# Patient Record
Sex: Male | Born: 1935
Health system: Southern US, Community
[De-identification: ages and names within clinical notes are randomized; demographics above are authoritative.]

## PROBLEM LIST (undated history)

## (undated) DIAGNOSIS — D899 Disorder involving the immune mechanism, unspecified: Secondary | ICD-10-CM

## (undated) DIAGNOSIS — M069 Rheumatoid arthritis, unspecified: Secondary | ICD-10-CM

## (undated) DIAGNOSIS — I219 Acute myocardial infarction, unspecified: Secondary | ICD-10-CM

## (undated) DIAGNOSIS — I499 Cardiac arrhythmia, unspecified: Secondary | ICD-10-CM

## (undated) DIAGNOSIS — J189 Pneumonia, unspecified organism: Secondary | ICD-10-CM

## (undated) DIAGNOSIS — I4891 Unspecified atrial fibrillation: Secondary | ICD-10-CM

## (undated) DIAGNOSIS — I2510 Atherosclerotic heart disease of native coronary artery without angina pectoris: Secondary | ICD-10-CM

## (undated) DIAGNOSIS — J449 Chronic obstructive pulmonary disease, unspecified: Secondary | ICD-10-CM

## (undated) DIAGNOSIS — B958 Unspecified staphylococcus as the cause of diseases classified elsewhere: Secondary | ICD-10-CM

## (undated) DIAGNOSIS — J8410 Pulmonary fibrosis, unspecified: Secondary | ICD-10-CM

## (undated) DIAGNOSIS — R0602 Shortness of breath: Secondary | ICD-10-CM

## (undated) DIAGNOSIS — L089 Local infection of the skin and subcutaneous tissue, unspecified: Secondary | ICD-10-CM

## (undated) DIAGNOSIS — I714 Abdominal aortic aneurysm, without rupture, unspecified: Secondary | ICD-10-CM

## (undated) DIAGNOSIS — I10 Essential (primary) hypertension: Secondary | ICD-10-CM

## (undated) DIAGNOSIS — R310 Gross hematuria: Secondary | ICD-10-CM

## (undated) DIAGNOSIS — M1990 Unspecified osteoarthritis, unspecified site: Secondary | ICD-10-CM

## (undated) DIAGNOSIS — D649 Anemia, unspecified: Secondary | ICD-10-CM

## (undated) HISTORY — DX: Unspecified atrial fibrillation: I48.91

## (undated) HISTORY — DX: Atherosclerotic heart disease of native coronary artery without angina pectoris: I25.10

## (undated) HISTORY — DX: Abdominal aortic aneurysm, without rupture: I71.4

## (undated) HISTORY — PX: CORONARY ANGIOPLASTY: SHX604

## (undated) HISTORY — PX: HAND SURGERY: SHX662

## (undated) HISTORY — DX: Unspecified osteoarthritis, unspecified site: M19.90

## (undated) HISTORY — DX: Abdominal aortic aneurysm, without rupture, unspecified: I71.40

## (undated) HISTORY — DX: Rheumatoid arthritis, unspecified: M06.9

## (undated) HISTORY — DX: Acute myocardial infarction, unspecified: I21.9

## (undated) HISTORY — DX: Chronic obstructive pulmonary disease, unspecified: J44.9

## (undated) HISTORY — DX: Unspecified staphylococcus as the cause of diseases classified elsewhere: B95.8

## (undated) HISTORY — DX: Disorder involving the immune mechanism, unspecified: D89.9

## (undated) HISTORY — PX: BACK SURGERY: SHX140

## (undated) HISTORY — DX: Pulmonary fibrosis, unspecified: J84.10

## (undated) HISTORY — PX: ESOPHAGOGASTRODUODENOSCOPY: SHX1529

## (undated) HISTORY — DX: Local infection of the skin and subcutaneous tissue, unspecified: L08.9

---

## 1998-03-27 DIAGNOSIS — I219 Acute myocardial infarction, unspecified: Secondary | ICD-10-CM

## 1998-03-27 HISTORY — PX: OTHER SURGICAL HISTORY: SHX169

## 1998-03-27 HISTORY — DX: Acute myocardial infarction, unspecified: I21.9

## 1999-05-30 ENCOUNTER — Encounter: Payer: Self-pay | Admitting: Rheumatology

## 1999-05-30 ENCOUNTER — Encounter: Admission: RE | Admit: 1999-05-30 | Discharge: 1999-05-30 | Payer: Self-pay | Admitting: Rheumatology

## 1999-06-08 ENCOUNTER — Inpatient Hospital Stay (HOSPITAL_COMMUNITY): Admission: EM | Admit: 1999-06-08 | Discharge: 1999-06-11 | Payer: Self-pay | Admitting: Emergency Medicine

## 1999-06-08 ENCOUNTER — Encounter: Payer: Self-pay | Admitting: Rheumatology

## 2000-12-14 ENCOUNTER — Encounter (INDEPENDENT_AMBULATORY_CARE_PROVIDER_SITE_OTHER): Payer: Self-pay | Admitting: Specialist

## 2000-12-14 ENCOUNTER — Inpatient Hospital Stay (HOSPITAL_COMMUNITY): Admission: EM | Admit: 2000-12-14 | Discharge: 2000-12-19 | Payer: Self-pay

## 2000-12-14 ENCOUNTER — Encounter: Payer: Self-pay | Admitting: *Deleted

## 2000-12-15 ENCOUNTER — Encounter: Payer: Self-pay | Admitting: *Deleted

## 2000-12-17 ENCOUNTER — Encounter: Payer: Self-pay | Admitting: Infectious Diseases

## 2000-12-18 ENCOUNTER — Encounter: Payer: Self-pay | Admitting: *Deleted

## 2001-01-23 ENCOUNTER — Ambulatory Visit (HOSPITAL_COMMUNITY): Admission: RE | Admit: 2001-01-23 | Discharge: 2001-01-23 | Payer: Self-pay | Admitting: Neurological Surgery

## 2001-01-29 ENCOUNTER — Encounter: Admission: RE | Admit: 2001-01-29 | Discharge: 2001-01-29 | Payer: Self-pay | Admitting: Urology

## 2001-01-29 ENCOUNTER — Encounter: Payer: Self-pay | Admitting: Urology

## 2001-06-11 ENCOUNTER — Encounter: Payer: Self-pay | Admitting: Urology

## 2001-06-11 ENCOUNTER — Encounter: Admission: RE | Admit: 2001-06-11 | Discharge: 2001-06-11 | Payer: Self-pay | Admitting: Urology

## 2001-06-12 ENCOUNTER — Encounter (INDEPENDENT_AMBULATORY_CARE_PROVIDER_SITE_OTHER): Payer: Self-pay | Admitting: Specialist

## 2001-06-12 ENCOUNTER — Ambulatory Visit (HOSPITAL_BASED_OUTPATIENT_CLINIC_OR_DEPARTMENT_OTHER): Admission: RE | Admit: 2001-06-12 | Discharge: 2001-06-12 | Payer: Self-pay | Admitting: Urology

## 2001-08-22 ENCOUNTER — Encounter: Admission: RE | Admit: 2001-08-22 | Discharge: 2001-08-22 | Payer: Self-pay | Admitting: Urology

## 2001-08-22 ENCOUNTER — Encounter: Payer: Self-pay | Admitting: Urology

## 2001-10-30 ENCOUNTER — Ambulatory Visit (HOSPITAL_COMMUNITY): Admission: RE | Admit: 2001-10-30 | Discharge: 2001-10-30 | Payer: Self-pay | Admitting: Gastroenterology

## 2001-10-30 ENCOUNTER — Encounter: Payer: Self-pay | Admitting: Gastroenterology

## 2001-10-31 ENCOUNTER — Encounter: Payer: Self-pay | Admitting: Rheumatology

## 2001-10-31 ENCOUNTER — Encounter: Admission: RE | Admit: 2001-10-31 | Discharge: 2001-10-31 | Payer: Self-pay | Admitting: Rheumatology

## 2001-12-03 ENCOUNTER — Encounter: Payer: Self-pay | Admitting: General Surgery

## 2001-12-10 ENCOUNTER — Encounter: Payer: Self-pay | Admitting: Anesthesiology

## 2001-12-10 ENCOUNTER — Encounter (INDEPENDENT_AMBULATORY_CARE_PROVIDER_SITE_OTHER): Payer: Self-pay

## 2001-12-10 ENCOUNTER — Encounter: Payer: Self-pay | Admitting: Urology

## 2001-12-10 ENCOUNTER — Encounter (INDEPENDENT_AMBULATORY_CARE_PROVIDER_SITE_OTHER): Payer: Self-pay | Admitting: *Deleted

## 2001-12-10 ENCOUNTER — Inpatient Hospital Stay (HOSPITAL_COMMUNITY): Admission: RE | Admit: 2001-12-10 | Discharge: 2001-12-13 | Payer: Self-pay | Admitting: General Surgery

## 2001-12-11 ENCOUNTER — Encounter: Payer: Self-pay | Admitting: Urology

## 2002-04-08 ENCOUNTER — Encounter: Payer: Self-pay | Admitting: Orthopedic Surgery

## 2002-04-14 ENCOUNTER — Encounter: Payer: Self-pay | Admitting: Orthopedic Surgery

## 2002-04-14 ENCOUNTER — Inpatient Hospital Stay (HOSPITAL_COMMUNITY): Admission: RE | Admit: 2002-04-14 | Discharge: 2002-04-17 | Payer: Self-pay | Admitting: Orthopedic Surgery

## 2002-11-07 ENCOUNTER — Encounter: Admission: RE | Admit: 2002-11-07 | Discharge: 2002-11-07 | Payer: Self-pay | Admitting: Rheumatology

## 2002-11-07 ENCOUNTER — Encounter: Payer: Self-pay | Admitting: Rheumatology

## 2003-10-12 ENCOUNTER — Inpatient Hospital Stay (HOSPITAL_COMMUNITY): Admission: EM | Admit: 2003-10-12 | Discharge: 2003-10-15 | Payer: Self-pay | Admitting: Emergency Medicine

## 2004-03-26 ENCOUNTER — Emergency Department (HOSPITAL_COMMUNITY): Admission: EM | Admit: 2004-03-26 | Discharge: 2004-03-26 | Payer: Self-pay | Admitting: *Deleted

## 2004-05-20 ENCOUNTER — Ambulatory Visit (HOSPITAL_COMMUNITY): Admission: RE | Admit: 2004-05-20 | Discharge: 2004-05-20 | Payer: Self-pay | Admitting: Gastroenterology

## 2004-06-06 ENCOUNTER — Inpatient Hospital Stay (HOSPITAL_COMMUNITY): Admission: RE | Admit: 2004-06-06 | Discharge: 2004-06-08 | Payer: Self-pay | Admitting: Orthopedic Surgery

## 2006-06-06 ENCOUNTER — Ambulatory Visit: Payer: Self-pay | Admitting: Thoracic Surgery

## 2006-12-11 ENCOUNTER — Ambulatory Visit: Payer: Self-pay | Admitting: Vascular Surgery

## 2007-12-10 ENCOUNTER — Ambulatory Visit: Payer: Self-pay | Admitting: Vascular Surgery

## 2009-05-18 ENCOUNTER — Encounter: Admission: RE | Admit: 2009-05-18 | Discharge: 2009-05-18 | Payer: Self-pay | Admitting: Internal Medicine

## 2009-06-02 ENCOUNTER — Ambulatory Visit (HOSPITAL_COMMUNITY): Admission: RE | Admit: 2009-06-02 | Discharge: 2009-06-02 | Payer: Self-pay | Admitting: Geriatric Medicine

## 2009-08-12 ENCOUNTER — Ambulatory Visit (HOSPITAL_COMMUNITY): Admission: RE | Admit: 2009-08-12 | Discharge: 2009-08-12 | Payer: Self-pay | Admitting: Gastroenterology

## 2010-02-22 ENCOUNTER — Ambulatory Visit: Payer: Self-pay | Admitting: Vascular Surgery

## 2010-07-02 ENCOUNTER — Emergency Department (HOSPITAL_COMMUNITY): Payer: Medicare Other

## 2010-07-02 ENCOUNTER — Inpatient Hospital Stay (HOSPITAL_COMMUNITY)
Admission: EM | Admit: 2010-07-02 | Discharge: 2010-07-08 | DRG: 581 | Disposition: A | Discharge status: Home Health | Payer: Medicare Other | Attending: Internal Medicine | Admitting: Internal Medicine

## 2010-07-02 DIAGNOSIS — Z9861 Coronary angioplasty status: Secondary | ICD-10-CM

## 2010-07-02 DIAGNOSIS — E669 Obesity, unspecified: Secondary | ICD-10-CM | POA: Diagnosis present

## 2010-07-02 DIAGNOSIS — M069 Rheumatoid arthritis, unspecified: Secondary | ICD-10-CM | POA: Diagnosis present

## 2010-07-02 DIAGNOSIS — K219 Gastro-esophageal reflux disease without esophagitis: Secondary | ICD-10-CM | POA: Diagnosis present

## 2010-07-02 DIAGNOSIS — E785 Hyperlipidemia, unspecified: Secondary | ICD-10-CM | POA: Diagnosis present

## 2010-07-02 DIAGNOSIS — I2510 Atherosclerotic heart disease of native coronary artery without angina pectoris: Secondary | ICD-10-CM | POA: Diagnosis present

## 2010-07-02 DIAGNOSIS — N400 Enlarged prostate without lower urinary tract symptoms: Secondary | ICD-10-CM | POA: Diagnosis present

## 2010-07-02 DIAGNOSIS — I252 Old myocardial infarction: Secondary | ICD-10-CM

## 2010-07-02 DIAGNOSIS — Z7982 Long term (current) use of aspirin: Secondary | ICD-10-CM

## 2010-07-02 DIAGNOSIS — J449 Chronic obstructive pulmonary disease, unspecified: Secondary | ICD-10-CM | POA: Diagnosis present

## 2010-07-02 DIAGNOSIS — I10 Essential (primary) hypertension: Secondary | ICD-10-CM | POA: Diagnosis present

## 2010-07-02 DIAGNOSIS — Z96649 Presence of unspecified artificial hip joint: Secondary | ICD-10-CM

## 2010-07-02 DIAGNOSIS — J4489 Other specified chronic obstructive pulmonary disease: Secondary | ICD-10-CM | POA: Diagnosis present

## 2010-07-02 DIAGNOSIS — M65839 Other synovitis and tenosynovitis, unspecified forearm: Secondary | ICD-10-CM | POA: Diagnosis present

## 2010-07-02 DIAGNOSIS — IMO0002 Reserved for concepts with insufficient information to code with codable children: Secondary | ICD-10-CM

## 2010-07-02 DIAGNOSIS — M1990 Unspecified osteoarthritis, unspecified site: Secondary | ICD-10-CM | POA: Diagnosis present

## 2010-07-02 DIAGNOSIS — Z87891 Personal history of nicotine dependence: Secondary | ICD-10-CM

## 2010-07-02 LAB — DIFFERENTIAL
Basophils Absolute: 0 10*3/uL (ref 0.0–0.1)
Basophils Relative: 0 % (ref 0–1)
Eosinophils Absolute: 0 10*3/uL (ref 0.0–0.7)
Eosinophils Relative: 0 % (ref 0–5)
Lymphs Abs: 0.9 10*3/uL (ref 0.7–4.0)
Monocytes Absolute: 1.5 10*3/uL — ABNORMAL HIGH (ref 0.1–1.0)

## 2010-07-02 LAB — BASIC METABOLIC PANEL
CO2: 22 mEq/L (ref 19–32)
GFR calc Af Amer: >60 mL/min (ref >60)
Glucose, Bld: 101 mg/dL — ABNORMAL HIGH (ref 70–99)
Potassium: 3.7 mEq/L (ref 3.5–5.1)
Sodium: 135 mEq/L (ref 135–145)

## 2010-07-02 LAB — CBC
MCHC: 34.5 g/dL (ref 30.0–36.0)
Platelets: 205 10*3/uL (ref 150–400)
RDW: 13.3 % (ref 11.5–15.5)

## 2010-07-03 ENCOUNTER — Emergency Department (HOSPITAL_COMMUNITY): Payer: Medicare Other

## 2010-07-03 LAB — COMPREHENSIVE METABOLIC PANEL
AST: 22 U/L (ref 0–37)
Albumin: 2.9 g/dL — ABNORMAL LOW (ref 3.5–5.2)
Alkaline Phosphatase: 63 U/L (ref 39–117)
CO2: 22 mEq/L (ref 19–32)
Total Bilirubin: 1.5 mg/dL — ABNORMAL HIGH (ref 0.3–1.2)
Total Protein: 5.7 g/dL — ABNORMAL LOW (ref 6.0–8.3)

## 2010-07-03 LAB — CBC
HCT: 35.7 % — ABNORMAL LOW (ref 39.0–52.0)
HCT: 36 % — ABNORMAL LOW (ref 39.0–52.0)
Hemoglobin: 12.5 g/dL — ABNORMAL LOW (ref 13.0–17.0)
MCH: 31.4 pg (ref 26.0–34.0)
MCHC: 34.7 g/dL (ref 30.0–36.0)
MCHC: 34.1 g/dL (ref 30.0–36.0)
MCV: 92.7 fL (ref 78.0–100.0)
MCV: 92.3 fL (ref 78.0–100.0)
Platelets: 163 10*3/uL (ref 150–400)
Platelets: 173 10*3/uL (ref 150–400)
RBC: 3.85 MIL/uL — ABNORMAL LOW (ref 4.22–5.81)
RDW: 13.5 % (ref 11.5–15.5)
RDW: 13.5 % (ref 11.5–15.5)
WBC: 21.5 10*3/uL — ABNORMAL HIGH (ref 4.0–10.5)
WBC: 21.9 10*3/uL — ABNORMAL HIGH (ref 4.0–10.5)

## 2010-07-03 LAB — CARDIAC PANEL(CRET KIN+CKTOT+MB+TROPI)
Relative Index: 1.7 (ref 0.0–2.5)
Total CK: 212 U/L (ref 7–232)
Troponin I: 0.03 ng/mL (ref 0.00–0.06)

## 2010-07-03 LAB — TROPONIN I: Troponin I: 0.04 ng/mL (ref 0.00–0.06)

## 2010-07-03 LAB — CK TOTAL AND CKMB (NOT AT ARMC): Relative Index: 0.7 (ref 0.0–2.5)

## 2010-07-04 LAB — CBC
MCH: 30.7 pg (ref 26.0–34.0)
MCHC: 33.3 g/dL (ref 30.0–36.0)
Platelets: 189 10*3/uL (ref 150–400)
RDW: 13.6 % (ref 11.5–15.5)

## 2010-07-04 LAB — BASIC METABOLIC PANEL
CO2: 23 mEq/L (ref 19–32)
Calcium: 8.5 mg/dL (ref 8.4–10.5)
Creatinine, Ser: 0.81 mg/dL (ref 0.4–1.5)
GFR calc Af Amer: >60 mL/min (ref >60)
GFR calc non Af Amer: >60 mL/min (ref >60)

## 2010-07-04 LAB — CK TOTAL AND CKMB (NOT AT ARMC)
CK, MB: 3.5 ng/mL (ref 0.3–4.0)
Relative Index: 2.2 (ref 0.0–2.5)
Total CK: 157 U/L (ref 7–232)

## 2010-07-04 LAB — TROPONIN I: Troponin I: 0.01 ng/mL (ref 0.00–0.06)

## 2010-07-05 LAB — BASIC METABOLIC PANEL
GFR calc non Af Amer: >60 mL/min (ref >60)
Glucose, Bld: 98 mg/dL (ref 70–99)
Potassium: 3.5 mEq/L (ref 3.5–5.1)
Sodium: 138 mEq/L (ref 135–145)

## 2010-07-05 LAB — SURGICAL PCR SCREEN: MRSA, PCR: NEGATIVE

## 2010-07-05 LAB — CBC
HCT: 33.5 % — ABNORMAL LOW (ref 39.0–52.0)
Hemoglobin: 11.1 g/dL — ABNORMAL LOW (ref 13.0–17.0)
RDW: 13.5 % (ref 11.5–15.5)
WBC: 12.6 10*3/uL — ABNORMAL HIGH (ref 4.0–10.5)

## 2010-07-06 LAB — BASIC METABOLIC PANEL
CO2: 22 mEq/L (ref 19–32)
Calcium: 8.4 mg/dL (ref 8.4–10.5)
Chloride: 110 mEq/L (ref 96–112)
Glucose, Bld: 87 mg/dL (ref 70–99)
Potassium: 4 mEq/L (ref 3.5–5.1)
Sodium: 141 mEq/L (ref 135–145)

## 2010-07-06 LAB — CBC
HCT: 30.4 % — ABNORMAL LOW (ref 39.0–52.0)
Hemoglobin: 10.2 g/dL — ABNORMAL LOW (ref 13.0–17.0)
MCHC: 33.6 g/dL (ref 30.0–36.0)
RBC: 3.29 MIL/uL — ABNORMAL LOW (ref 4.22–5.81)

## 2010-07-07 DIAGNOSIS — B950 Streptococcus, group A, as the cause of diseases classified elsewhere: Secondary | ICD-10-CM

## 2010-07-07 DIAGNOSIS — L089 Local infection of the skin and subcutaneous tissue, unspecified: Secondary | ICD-10-CM

## 2010-07-07 LAB — CBC
HCT: 30.9 % — ABNORMAL LOW (ref 39.0–52.0)
Hemoglobin: 10.3 g/dL — ABNORMAL LOW (ref 13.0–17.0)
MCH: 30.7 pg (ref 26.0–34.0)
MCHC: 33.3 g/dL (ref 30.0–36.0)
MCV: 92.2 fL (ref 78.0–100.0)

## 2010-07-08 ENCOUNTER — Inpatient Hospital Stay (HOSPITAL_COMMUNITY): Payer: Medicare Other

## 2010-07-08 LAB — CBC
HCT: 32 % — ABNORMAL LOW (ref 39.0–52.0)
MCH: 31.1 pg (ref 26.0–34.0)
MCHC: 33.8 g/dL (ref 30.0–36.0)
MCV: 92.2 fL (ref 78.0–100.0)
Platelets: 250 10*3/uL (ref 150–400)
RDW: 13.3 % (ref 11.5–15.5)

## 2010-07-08 LAB — ANAEROBIC CULTURE

## 2010-07-08 LAB — BASIC METABOLIC PANEL
BUN: 7 mg/dL (ref 6–23)
Creatinine, Ser: 0.61 mg/dL (ref 0.4–1.5)
GFR calc non Af Amer: >60 mL/min (ref >60)
Glucose, Bld: 95 mg/dL (ref 70–99)

## 2010-07-09 LAB — CULTURE, BLOOD (ROUTINE X 2): Culture: NO GROWTH

## 2010-07-18 NOTE — Discharge Summary (Signed)
[name], [name]               ACCOUNT NO.:  [id number]  MEDICAL RECORD NO.:  [id number]           PATIENT TYPE:  I  LOCATION:  4742                         FACILITY:  [facility]  PHYSICIAN:  Isidor Holts, M.D.  DATE OF BIRTH:  1930-05-10  DATE OF ADMISSION:  07/02/2010 DATE OF DISCHARGE:  07/08/2010                              DISCHARGE SUMMARY   PRIMARY MD:  Hal T. Stoneking, MD  PRIMARY CARDIOLOGIST:  Lyn Records, MD  DISCHARGE DIAGNOSES: 1. Abscess right hand/forearm and flexor tenosynovitis of right long     finger, status post incision and drainage and debridement on July 05, 2010. The culprit organism, group A Streptococcus pyogenes. 2. History of coronary artery disease, status post cardiac stent. 3. History of abdominal aortic aneurysm. 4. GERD, status post Nissen fundoplication. 5. History of diverticulosis. 6. Rheumatoid arthritis and osteoarthritis. 7. Dyslipidemia. 8. Benign prostatic hypertrophy.  DISCHARGE MEDICATIONS: 1. Rocephin 2 g IV q.24 h to be completed on Aug 01, 2010, inclusive. 2. Oxycodone 5 mg IR 5 mg p.o. p.r.n. q. 4 hourly for pain, extra 42     pills have been dispensed. 3. Altace 2.5 mg p.o. daily. 4. Aspirin OTC 81 mg p.o. daily. 5. Calcium carbonate OTC 600 mg p.o. daily. 6. Flomax 0.4 mg p.o. daily. 7. Lipitor 80 mg p.o. nightly. 8. Meloxicam 15 mg p.o. daily. 9. Metoprolol tartrate 50 mg p.o. b.i.d. 10.Multivitamins 1 p.o. daily. 11.Nitroglycerin 0.4 mg one tablet sublingually p.r.n. q. 5 minutes x3     doses for chest pain. 12.Omeprazole 20 mg p.o. daily. 13.Plaquenil 200 mg p.o. b.i.d. 14.Prednisone 5 mg p.o. daily. 15.Tylenol Extra Strength 500 mg 2 tablets p.o. p.r.n. t.i.d. for     arthritis. 16.Voltaren gel 1%  4 g applied to lower extremity joints p.r.n. q. 6     hourly for pain. 17.Zyrtec 10 mg p.o. daily.  PROCEDURES: 1. X-ray right hand July 03, 2010.  This showed marked degenerative     changes in the hands  and wrists as before diffuse soft tissue     swelling of the middle finger without evidence of underlying bone     lysis to suggest osseous infection. 2. Chest x-ray July 03, 2010.  This showed findings of COPD, lungs are     otherwise clear. 3. Chest x-ray July 08, 2010.  This showed a left arm PICC line with     tip of lying cavoatrial junction, no diagnostic pneumothorax.     Worsening inhalation from prior exam with mild interstitial     prominence bilaterally suspicious for mild interstitial edema. 4. Right long finger debridement of skin and subcutaneous tissue,     right long finger joint with  flexor tendon sheaths and right     forearm incision and drainage on July 05, 2010, by Dr. [name], hand surgeon, this was an uncomplicated procedure.  CONSULTATIONS:   1. [name] Done, MD, hand surgeon. 2. [name] Lav, MD, infectious disease specialist.  ADMISSION HISTORY:  As in H and P notes of July 02, 2010, dictated by  Dr. Lonia [name].  However, in brief, this is a 77 year old male, with known history of osteoarthritis, status post right total hip replacement, rheumatoid arthritis, coronary artery disease, status post cardiac stent, abdominal aortic aneurysm, previous history of epidural abscess, previous history of right hand abscess, dyslipidemia, benign prostatic hypertrophy, GERD, status post Nissen fundoplication, diverticulosis, presenting with a 3-day history of progressive right upper extremity swelling, pain and redness.  Symptoms initially involved his right elbow, but has progressed to involve the right hand.  He has had some fever and chills at home. He was admitted for further evaluation, investigation and management.  CLINICAL COURSE: 1. Right upper extremity infection and abscess.  The patient presented     as described above.  X-ray of the right hand showed soft tissue     swelling.  No evidence of bony infection.  The patient was      commenced on broad-spectrum antibiotic therapy, with a combination     of vancomycin and cefepime.  Hand Surgical consultation was called,     and kindly provided by Dr. [name], who diagnosed right long     finger flexor tendon tenosynovitis as well as the     right forearm abscess.  He took the patient to OR on July 05, 2010, performed I and D and debridement. Abscess culture     subsequently grew group A Streptococcus by genus.  Thus antibiotic     therapy was rationalized to monotherapy with Rocephin as of July 07, 2010.  Infectious Disease consultation was kindly provided by     Dr. Paulette Blanch [place], who recommended high-dose Rocephin, i.e. 2 g     IV q.24 h and a total of 4 weeks of antibiotic therapy to be     completed on Aug 01, 2010.  Over the course of hospitalization, the     patient improved,and clinically defervesced.  In the last few days of     hospitalization, there were no further recorded episodes of     pyrexia.  White cell count which was 21.5 on July 03, 2010, had by     July 08, 2010, normalized at 9.1.  The patient felt considerably     better as a matter of fact, local inflammatory phenomena had  significantly ameliorated, and although his wound continued to drain,     this is being managed with daily dressings and he had hydrotherapy     up until July 08, 2010, p.m.  The patient is to follow up with Dr.     Melvyn [name].  2. Coronary artery disease.  There were no problems referable to this.     During the course of the patient's hospitalization, he continued on     preadmission cardiotropic medications.  3. Gastroesophageal reflux disease, the patient was asymptomatic, on     proton pump inhibitor treatment.  4. History of rheumatoid arthritis.  The patient continued on chronic     steroid treatment in preadmission dosage.  5. Dyslipidemia.  The patient is on Zetia for this.  6. BPH.  The patient continues on Flomax.  He had no symptoms of      prostatism during the course of this hospitalization.  DISPOSITION:  The patient was on July 08, 2010, considered clinically stable for discharge, he was therefore discharged accordingly.  PICC line for antibiotic administration was placed on July 07, 2010.  The patient was discharged on July 08, 2010.  ACTIVITY:  As tolerated.  Recommended to increase activity slowly.  DIET:  Heart-healthy.  WOUND CARE:  Daily wound dressings by home health RN.  FOLLOWUP INSTRUCTIONS:  The patient is to follow up with Dr. [name], hand surgeon on Monday July 11, 2010, at 1:00 p.m. appointments already been scheduled, telephone number [phone].  In addition, the patient is to follow up with Dr. Paulette Blanch [facility], Infectious Disease specialist on Tuesday Aug 02, 2010, at 4:00 p.m. and appointment had already been scheduled, telephone number [phone].  SPECIAL INSTRUCTIONS:  Home health RN has been arranged for wound dressings and antibiotic administration.     Isidor Holts, M.D.     CO/MEDQ  D:  07/08/2010  T:  07/09/2010  Job:  [id number]  cc:   Hal T. Stoneking, M.D. Lyn Records, M.D. [name] Lav, MD [name] Done, MD  Electronically Signed by Isidor Holts M.D. on 07/18/2010 03:06:10 PM

## 2010-07-19 NOTE — Op Note (Signed)
[name] [name]               ACCOUNT NO.:  [id number]  MEDICAL RECORD NO.:  [id number]           PATIENT TYPE:  I  LOCATION:  4742                         FACILITY:  [facility]  PHYSICIAN:  [name], MD  DATE OF BIRTH:  11-Aug-1930  DATE OF PROCEDURE:  07/03/2010 DATE OF DISCHARGE:                              OPERATIVE REPORT   PREOPERATIVE DIAGNOSIS:  Right long finger flexor tendon purulent tenosynovitis.  POSTOPERATIVE DIAGNOSIS:  Right long finger flexor tendon purulent tenosynovitis.  ATTENDING PHYSICIAN:  [name], MD who scrubbed and present for the entire procedure.  ASSISTANT SURGEON:  None.  SURGICAL PROCEDURES: 1. Right long finger incision and drainage of flexor tendon sheath. 2. Right long finger debridement skin and subcutaneous tissue and     drainage of abscess.  ANESTHESIA:  General via endotracheal tube.  TOURNIQUET TIME:  8 minutes at 250 mmHg.  SURGICAL DRAIN:  One blue vessel loop. INTRAOPERATIVE FINDINGS:  The patient did have a grossly purulent flexor tendon sheath as well as an abscess involving the entire volar length of the finger grossly infected tissue.  SURGICAL INDICATION:  [name] [name] is a 77 year old gentleman with a worsening right long finger infection.  The patient presented to the emergency department and was admitted to the Medicine Service.  I was consulted and based on the severity of his infection, it was recommended he undergo the above procedure.  Risks, benefits, and alternatives were discussed in detail with the patient and signed informed consent was obtained.  Risks include, but not limited to bleeding, infection, damage to nearby nerves, arteries, or tendons, loss of motion of the elbow, wrist, and digits, persistent infection, loss of the digit, and need for further surgical intervention.  DESCRIPTION OF PROCEDURE:  The patient was properly identified in the preop holding area and a mark with permanent  marker made on the right long finger to indicate the correct operative site.  The patient was then brought back to the operating room, placed supine on the anesthesia table.  General anesthesia was administered.  The patient was started on [name] antibiotics prior to skin incision.  Well-padded tourniquet was then placed on the right brachium and sealed with a 1000 drape.  The right upper extremity was then prepped and draped in normal sterile fashion. Time-out was called, correct side was identified, and the procedure was then begun.  Attention was turned to the right hand where the right upper extremity was prepped and draped in normal fashion.  The long finger was 3 times the size of the other digits.  Sausage-appearing digit.  The limb was then elevated, tourniquet insufflated for exposure. At this time, an oblique incision was made directly over where the abscess area was over the distal interphalangeal joint region.  Gross purulence was encountered, wound cultures were then taken.  After this was then carried out, incision and drainage and debridement of skin and subcutaneous tissue was then carried out along the volar surface of the finger.  The flexor sheath was then opened.  An oblique incision was then made in the palm and the flexor sheath  and drainage was then carried out with a small pediatric feeding tube with 1000 L of fluid running through the flexor sheath.  After drainage of the flexor sheath, thorough irrigation was done with 3 L to drain out the abscess area along the volar surface of the finger.  Copious irrigation was run throughout the finger.  After thorough irrigation, the wound was then loosely reapproximated with a stitch of 4-0 Prolene both proximally and distally closed over a small vessel loop.  Adaptic dressings were then applied.  Sterile compressive bandage was then applied.  The patient was then placed in a well-padded volar splint.  He was then taken back  to the recovery room in good condition.  POSTOPERATIVE PLAN:  The patient admitted back to the Internal Medicine Service, continue on the [name] antibiotic therapy.  I plan to see him back on a daily basis.  He will return to the OR in 2 days for repeat I and D given the severity of infection.     [name], MD     FWO/MEDQ  D:  07/03/2010  T:  07/03/2010  Job:  [id number]  Electronically Signed by [name] Bienenstock IV MD on 07/19/2010 05:49:54 PM

## 2010-07-19 NOTE — Consult Note (Signed)
NAMECORNELIS, KLUVER               ACCOUNT NO.:  [id number]  MEDICAL RECORD NO.:  [id number]           PATIENT TYPE:  I  LOCATION:  4742                         FACILITY:  [facility]  PHYSICIAN:  [name], MD  DATE OF BIRTH:  Apr 18, 1930  DATE OF CONSULTATION:  07/03/2010 DATE OF DISCHARGE:                                CONSULTATION   REASON FOR CONSULTATION:  Right long finger infection.  BRIEF HISTORY:  Mr. Summit is a 77 year old right-hand-dominant gentleman with a 3-day history of worsening infection to his right long finger.  The patient presented to the emergency department with worsening cellulitis and swelling to the right long finger.  They are concerned about the infection and I was asked to see and evaluate the patient for the right long finger.  The patient is being admitted to the hospitalist service secondary to his multiple medical conditions and for IV antibiotics and continued care.  His past medical history, past surgical history, medications, allergies, social history, review of systems as dictated in the [facility], history of physical is well also I reviewed in the chart.  The patient's operative reports in the past also been reviewed to include the procedure from back in 2005 at which time Dr. Mina Marble oriented infection over the dorsal aspect of his hand and wrist.  On examination, he is a somewhat frail-appearing gentleman, height and weight in the medical record.  He is alert and oriented to person, place and time.  On limited examination of the right upper extremity, the patient does have marked swelling into the right long finger tenderness directly over the flexor sheath, sausage-appearing digit.  Any pain with any passive flexion or extension of the digit.  He has no tenderness to his index ring or small swelling from the long finger all the way to the MP joint extending into the palm.  He has redness going all the way up into  his forearm to the epitrochlear region of the elbow.  He is able to flex and extend his elbow, flex and extend his wrist, extend his thumb, extend his digits.  Fingertips are warm.  He has a good radial pulse.  His radiographs of any were reviewed.  The patient does have extensive degenerative changes noted within the wrist from his underlying rheumatoid arthritis.  The patient has complete loss of the radiocarpal articulation and articulation of the distal ulna, relatively good preservation of MP joint and IP joint.  He does have a marked soft tissue swelling to the right long finger.  His white count is elevated at 22.1, glucose 101, creatinine 1.04, potassium 3.7, sodium 135.  IMPRESSION:  Right long finger flexor sheath infection.  PLAN:  Today the findings were reviewed with Mr. Gullo and his wife.  My recommendation is that the patient should undergo formal incision and drainage of the flexor sheath and necessary debridement.  We talked about the risks of surgery to include but not limited to bleeding, infection, damage to nearby nerves, arteries or tendons, loss of motion of wrist and digits, persistent infection and need for further surgical intervention.  All questions  were answered and encouraged today.  The patient voiced understanding of plan and signed inform consent was obtained from the daughter.  No new medications were given.  No medications were changed in his list.  He is on 3 different antibiotics per [facility].  Likely he is going to need repeat I and D within the next 48 hours based on the severity of infection.     [name], MD     FWO/MEDQ  D:  07/03/2010  T:  07/03/2010  Job:  [id number]  Electronically Signed by [name] IV MD on 07/19/2010 05:49:51 PM

## 2010-07-19 NOTE — Op Note (Signed)
NAMELOYE, VENTO               ACCOUNT NO.:  [id number]  MEDICAL RECORD NO.:  [id number]           PATIENT TYPE:  I  LOCATION:  4742                         FACILITY:  [facility]  PHYSICIAN:  [name], MD  DATE OF BIRTH:  12-Oct-1930  DATE OF PROCEDURE:  07/05/2010 DATE OF DISCHARGE:                              OPERATIVE REPORT   PREOPERATIVE DIAGNOSES: 1. Right long finger flexor tenosynovitis. 2. Right forearm abscess.  POSTOPERATIVE DIAGNOSES: 1. Right long finger stenosing tenosynovitis. 2. Right forearm swelling and edema.  ANESTHESIA:  General via LMA.  TOURNIQUET TIME:  Zero minutes.  SURGICAL PROCEDURES: 1. Right long finger debridement of skin and subcutaneous tissue. 2. Right long finger drainage of flexor tendon sheath. 3. Right forearm incision and drainage.  SURGICAL INDICATION:  [name] is a 77 year old right-hand-dominant male who was taken to the operating room on July 03, 2010, for infection.  The patient is scheduled to undergo the above procedure. Risks, benefits, and alternatives were discussed in detail with the patient and signed informed consent was obtained.  Risks include, but not limited to bleeding, infection, damage to nearby nerves, arteries, or tendons, persistent infection, and need for further surgical intervention.  DESCRIPTION OF PROCEDURE:  The patient was appropriately identified in the preop holding area and a mark with a permanent marker made on the right long finger and forearm to indicate correct operative sites.  The patient was then brought back to the operating room, placed supine on the anesthesia room table.  General anesthesia was administered.  The patient had been on preoperative antibiotics.  A well-padded tourniquet was not placed.  The right upper extremity was sealed with one U-drape and prepped and draped in normal sterile fashion.  Time-out was called and correct side was identified and the procedure  then begun.  Attention was then turned to the right long finger where the previous skin sutures were then removed and the drains had been removed.  Debridement of skin and subcutaneous tissue was then carried out sharply with small nice rongeur.  After excisional debridement, flexor sheath was then opened both proximally and distally and thorough 3 L irrigation was then run throughout the finger.  Copious irrigation was then done throughout flexor sheath.  Once this was carried out, attention was then turned to the forearm.  The patient did have the worsening redness and lymphangitis in the forearm extended to elbow extending into the arm. There was concern about potential worsening infection, deep space infection.  A small oblique incision was made directly over the medial antecubital area.  Dissection was then carried down through the skin and subcutaneous tissue down to the fascia and there was no evidence of deep space infection.  Thorough irrigation was then done.  The wound was then loosely reapproximated with several simple Prolene sutures.  After thoroughly irrigating the finger once again, the skin was then closed using Prolene sutures.  Adaptic dressings were then applied.  Sterile compressive bandage was then applied.  The patient was then placed in a long-arm splint, extubated, and returned to the recovery room in a good condition.  POSTOPERATIVE PLAN:  The patient will be continued on inpatient medical service, consult ID in the morning for a long-term antibiotics and IV regimen.  Therapy to begin hydrotherapy and wound care on a daily basis. If he continues to respond then outpatient IV or antibiotic regimen as per ID and close clinical followup.     [name], MD     FWO/MEDQ  D:  07/05/2010  T:  07/06/2010  Job:  [id number]  Electronically Signed by [name] IV MD on 07/19/2010 05:49:57 PM

## 2010-07-20 ENCOUNTER — Other Ambulatory Visit: Payer: Self-pay | Admitting: Infectious Disease

## 2010-07-20 ENCOUNTER — Encounter (HOSPITAL_COMMUNITY): Payer: Medicare Other | Attending: Infectious Disease

## 2010-07-20 DIAGNOSIS — Z4800 Encounter for change or removal of nonsurgical wound dressing: Secondary | ICD-10-CM | POA: Insufficient documentation

## 2010-07-20 DIAGNOSIS — Z452 Encounter for adjustment and management of vascular access device: Secondary | ICD-10-CM | POA: Insufficient documentation

## 2010-07-20 LAB — BASIC METABOLIC PANEL
BUN: 16 mg/dL (ref 6–23)
Calcium: 8.9 mg/dL (ref 8.4–10.5)
GFR calc non Af Amer: >60 mL/min (ref >60)
Glucose, Bld: 106 mg/dL — ABNORMAL HIGH (ref 70–99)
Potassium: 4.3 mEq/L (ref 3.5–5.1)
Sodium: 139 mEq/L (ref 135–145)

## 2010-07-20 LAB — CBC
MCHC: 33.3 g/dL (ref 30.0–36.0)
Platelets: 360 10*3/uL (ref 150–400)
RDW: 14.1 % (ref 11.5–15.5)
WBC: 8.5 10*3/uL (ref 4.0–10.5)

## 2010-07-25 NOTE — Consult Note (Signed)
NAMEINDY, [name]               ACCOUNT NO.:  [id number]  MEDICAL RECORD NO.:  [id number]           PATIENT TYPE:  I  LOCATION:  4742                         FACILITY:  [facility]  PHYSICIAN:  [name], MD  DATE OF BIRTH:  1930-04-03  DATE OF CONSULTATION: DATE OF DISCHARGE:                                CONSULTATION   REQUESTING PHYSICIAN:  Isidor Holts, MD  REASON FOR INFECTIOUS DISEASE CONSULTATION:  The patient with infection of right long finger with tenosynovitis and cellulitis.  HISTORY OF PRESENT ILLNESS:  [name] is a 77 year old Caucasian male with a past medical history significant for rheumatoid arthritis and infectious disease complications including prior methicillin-sensitive Staphylococcus aureus epidural abscess, and ankle septic arthritis, treated with cefazolin, then history of right wrist and hand abscess again with methicillin-sensitive Staphylococcus aureus, treated with cefazolin and followed by my partner, Dr. Ninetta Lights.  The patient had been doing relatively well until the Friday prior to admission when developed erythema involving his finger this progressed and erythema extended up into his arm and elbow.  He sought care with his wife at a local hospital in [facility], but when they were discouraged, ultimately brought the patient to [facility] on the 8th.  At that time, he had extensive erythema of his right upper extremity involving the right middle finger with 2 enter points of middle finger, posterior aspect of elbow as well and erythema extending from the hand all the way the lower third of the arm posteriorly.  He was admitted to the hospitalist service and Hand Surgery was consulted.  The patient was taken to the operating room where he had a right long finger incision and debridement of the flexor tendon sheath and right __________ debridement of the skin and subcutaneous tissues and drainage of an abscess.  This was done  on the 8th.  He was placed on vancomycin and Zosyn on the 7th and then changed to vancomycin and cefepime postoperatively.  These antibiotics were continued until his culture came back with pure group A Streptococci and he was changed to Rocephin.  We are asked to see the patient to assist in management workup of this patient with infectious tenosynovitis of the hand and finger with abscess and cellulitis.  PAST MEDICAL HISTORY: 1. Methicillin-sensitive Staphylococcus aureus epidural abscess and     septic arthritis of the ankle status post Ancef therapy. 2. Infected right septic extensor synovitis with abscesses in 2005     with methicillin-sensitive Staphylococcus aureus. 3. Rheumatoid arthritis. 4. Osteoarthritis. 5. Coronary artery disease. 6. Hyperlipidemia. 7. Aortic abdominal aneurysm. 8. Benign prostatic hypertrophy. 9. Gastroesophageal reflux disease.  PAST SURGICAL HISTORY: 1. I and D of epidural abscess by Dr. Danielle Dess. 2. Nissen fundoplication. 3. History of left hip arthroplasty.  FAMILY HISTORY:  Noncontributory.  SOCIAL HISTORY:  The patient is married, accompanied by his wife and does not smoke tobacco.  Do not use alcohol and do not use intravenous drug use.  He was a Engineer, maintenance.  ALLERGIES:  MORPHINE causes some nausea.  REVIEW OF SYSTEMS:  As described in the history of present illness, otherwise  12-point review of systems is negative.  CURRENT MEDICATIONS:  Albuterol, aspirin, ceftriaxone, Zetia, hydroxychloroquine, ipratropium, __________ , pantoprazole, prednisone ramipril, Crestor, Flomax, Tylenol, Dulcolax, Dilaudid, Zofran, oxycodone.  PHYSICAL EXAMINATION:  VITAL SIGNS:  Temperature maximum last 24 hours is 99.3, temperature current 98.0, blood pressure 144/72, pulse is 94, respirations 20, pulse ox 95% on room air. GENERAL:  Quite pleasant gentleman in no acute distress. HEENT:  Normocephalic, atraumatic.  Pupils equal, round, and reactive  to light.  Sclerae icteric.  Oropharynx clear. NECK:  Supple. CARDIOVASCULAR:  Regular rhythm.  No murmurs, gallops, or rubs heard. LUNGS:  Clear to auscultation bilaterally. ABDOMEN:  Soft, nondistended. MUSCULOSKELETAL:  His right arm is wrapped in bandage with some erythema extending outside the bandage on his upper arm.  Per the hydrotherapist, this had diminished in intensity over the last day or 2.  His right hand is wrapped in dressing as well.  LABORATORY DATA:  Plain films of chest x-ray shows chronic COPD findings.  Chest hand films on the 8th show diffuse soft tissue swelling of the middle finger without evidence of underlying bony lysis.  Further labs:  White count 10.1, hemoglobin 10.3, platelets 246. Metabolic panel:  Sodium 141, potassium 4, chloride 110, bicarb 22, BUN and creatinine 11.61.  Surgical PCR negative for MRSA and MSSA.  MICROBIOLOGICAL DATA: 1. Wound cultures on the 8th grew group A Strep pyogenes.  Anaerobic     cultures on the 8th, no growth to date. 2. Blood cultures from the 7th, 2 out of 2 no growth.  Prior blood     cultures, 2005, no growth. 3. Wound cultures from October 12, 2003, grew methicillin-sensitive     Staphylococcus aureus, which was sensitive to oxacillin,     levofloxacin, gentamicin, clindamycin, Unasyn, tetracycline,     moxifloxacin, vancomycin, tetracycline, rifampin. 4. Body fluid culture taken from ankle, methicillin-sensitive     Staphylococcus aureus.  Again sensitive to clindamycin,     levofloxacin, oxacillin, vancomycin, tetracycline.  Abscess of back     on December 15, 2000 is same sensitivities as mentioned above.  IMPRESSION/RECOMMENDATIONS:  This is a 77 year old gentleman who has had severe methicillin-sensitive Staph aureus infections including epidural abscess, ankle infection and hand infection, now presents with a new hand infection at this time with group A Streptococci. Group A Streptococcal infection of the  tendons and soft tissues without clear evidence in the OR or by films for osteomyelitis:  I agree with Rocephin and actually I am going to increase the dose to 2 grams which is more of an osteomyelitis-type dose.  I already got the patient this for 4 weeks total duration of antibiotics with arrangement to follow up in the clinic with either myself or Dr. Ninetta Lights.  The wife is quite anxious about the patient is being discharged to home, but I told her if the patient is clearly improving and he has close followup with Dr. [name], which is planned.  I would be comfortable with discharge provided he continues to better on a daily basis.  Thank you for this infectious disease consultation.     [name], MD     CV/MEDQ  D:  07/07/2010  T:  07/08/2010  Job:  [id number]  cc:   [name], MD  Electronically Signed by Paulette Blanch DAM MD on 07/25/2010 08:46:14 PM

## 2010-07-27 ENCOUNTER — Other Ambulatory Visit: Payer: Self-pay | Admitting: Infectious Disease

## 2010-07-27 ENCOUNTER — Encounter (HOSPITAL_COMMUNITY): Payer: Medicare Other | Attending: Infectious Disease

## 2010-07-27 DIAGNOSIS — Z452 Encounter for adjustment and management of vascular access device: Secondary | ICD-10-CM | POA: Insufficient documentation

## 2010-07-27 DIAGNOSIS — Z4800 Encounter for change or removal of nonsurgical wound dressing: Secondary | ICD-10-CM | POA: Insufficient documentation

## 2010-07-27 LAB — CBC
HCT: 39.1 % (ref 39.0–52.0)
MCHC: 32.7 g/dL (ref 30.0–36.0)
RDW: 14.6 % (ref 11.5–15.5)
WBC: 7.3 10*3/uL (ref 4.0–10.5)

## 2010-07-27 LAB — COMPREHENSIVE METABOLIC PANEL
ALT: 17 U/L (ref 0–53)
AST: 23 U/L (ref 0–37)
Albumin: 3.2 g/dL — ABNORMAL LOW (ref 3.5–5.2)
Alkaline Phosphatase: 74 U/L (ref 39–117)
Calcium: 9.1 mg/dL (ref 8.4–10.5)
GFR calc Af Amer: >60 mL/min (ref >60)
Glucose, Bld: 106 mg/dL — ABNORMAL HIGH (ref 70–99)
Potassium: 4.1 mEq/L (ref 3.5–5.1)
Sodium: 140 mEq/L (ref 135–145)
Total Protein: 6.3 g/dL (ref 6.0–8.3)

## 2010-07-31 NOTE — H&P (Signed)
NAMEGRIFFYN, [name]               ACCOUNT NO.:  [id number]  MEDICAL RECORD NO.:  [id number]           PATIENT TYPE:  E  LOCATION:  [facility]                         FACILITY:  [facility]  PHYSICIAN:  [name], M.D.      DATE OF BIRTH:  Jan 23, 1931  DATE OF ADMISSION:  07/02/2010 DATE OF DISCHARGE:                             HISTORY & PHYSICAL   PRIMARY CARE PHYSICIAN:  Hal T. Pete Glatter, MD  CARDIOLOGIST:  Lyn Records, MD  PRESENTING COMPLAINT:  Right upper extremity swelling and redness.  HISTORY OF PRESENT ILLNESS:  The patient is a 77 year old gentleman with history of rheumatoid arthritis, coronary artery disease among other things and prior multiple abscesses due to staph aureus.  He came in today secondary to 3 days progressive swelling around his right elbow which has progressed to involve his right hand.  He is tender to touch, red and warm.  He has had some fever at home and mild chills.  He has had prior surgery in the same hand including hardware that was  in there but removed and this was back in 2004 and 2003.  Denied any other abscess anywhere.  Denied any known injury or insect bite.  Denied contact with an abscess.  PAST MEDICAL HISTORY:  Very much significant for osteoarthritis, prior abscesses on the right hand as well as epidural space, both of which grew staph aureus, coronary artery disease, hyperlipidemia, BPH, aortic abdominal aneurysm which has been about 3.5 to 4 cm for so many years, GERD, rheumatoid arthritis.  PAST SURGICAL HISTORY:  He is status post right hand I and D, status post epidural abscess I and D, status post right total hip replacement, status post cardiac stents, status post Nissen fundoplication.  The patient also had history of diverticulitis and degenerative joint disease.  ALLERGIES:  To MORPHINE.  CURRENT MEDICATIONS: 1. Meloxicam 15 mg daily. 2. Plaquenil 200 mg twice a day. 3. Altace 25 mg daily. 4. Lipitor 80 mg daily. 5.  Lopressor 50 mg daily. 6. Nitroglycerin as needed. 7. Zetia 10 mg daily. 8. Aspirin 81 mg daily. 9. Calcium as needed. 10.Flomax 0.4 mg daily. 11.Prednisone 5 mg daily. 12.Voltaren gel as needed. 13.Prilosec 20 mg daily.  SOCIAL HISTORY:  The patient lives in [place] with his wife.  Denied any tobacco, alcohol or IV drug use.  He used to smoke about 3 packs per day but quit.  Very active.  He was a Engineer, maintenance, but quit.  FAMILY HISTORY:  His brother died of an MI.  Otherwise no significant family history.  He has 7 children most of which whom are fine.  REVIEW OF SYSTEMS:  All systems reviewed are currently negative except per HPI.  PHYSICAL EXAMINATION:  VITAL SIGNS:  Temperature is 102.5, blood pressure 119/64, pulse 127, respiratory rate 24, sats 96% room air. GENERAL:  He is awake, alert, oriented, pleasant man.  He is in no acute distress. HEENT:  PERRL.  EOMI.  No pallor.  No jaundice.  No rhinorrhea. NECK:  Supple.  No JVD, no lymphadenopathy. RESPIRATORY:  He has good air entry bilaterally.  No significant  wheezing.  No rales. CARDIOVASCULAR:  He is tachycardic. ABDOMEN:  Soft, full, nontender with positive bowel sounds. EXTREMITIES:  Right upper extremity is swollen, tender with a right middle finger more swelling with fluctuance.  He has 2 entry points at the tip of his right middle finger as well as at the posterior aspect of his elbow.  The redness extends from his hand all the way to the lower third of his arm posteriorly.  Warm to touch. SKIN:  His lesions are restricted to the right upper extremity.  No other rashes are seen. MUSCULOSKELETAL:  The patient has some disfigured digits probably from his longstanding rheumatoid arthritis.  LABORATORY FINDINGS:  His white count is 22.1 thousand with a left shift ANC of 19.7, hemoglobin 14.0 and platelet 205.  Sodium is 135, potassium 3.7, chloride 104, CO2 22, glucose is 101, BUN 23, creatinine 1.04  and calcium 8.9.  His x-ray of the right hand showed marked degenerative changes in the hand and wrist, diffuse soft tissue swelling with middle finger without evidence for underlying bony lysis to suggest osseous infection.  ASSESSMENT:  This is a 77 year old gentleman presenting with right hand cellulitis with evidence of abscess.  The patient has prior history of staph aureus infection.  From all indication, it was not MRSA.  He is also on chronic steroids treatment for his rheumatoid arthritis which makes him more immunocompromised and therefore more susceptible to infection, probably with other low virulent organisms.  PLAN: 1. Right upper extremity cellulitis and abscess.  We will admit the     patient, start him on empiric antibiotics.  I will start him on     both vancomycin and cefepime, pending cultures.  We will get blood     cultures.  With I and D, hopefully will be able to gross some     bacteria and we can narrow the antibiotic choice despite that which     the patient however had this cellulitis suggest possible staph in     this case. 2. Osteoarthritis and rheumatoid arthritis.  The patient will continue     his steroids as well as Plaquenil. 3. Coronary artery disease.  He is stable.  I will check cardiac     enzymes and put him on tele because of that. 4. Hyperlipidemia.  We will check fasting lipid panel and continue     with his home medication. 5. Rheumatoid arthritis.  Again we will continue the Plaquenil as well     as his steroids. 6. GERD.  I will continuous his PPI. 7. BPH.  Continue with his Flomax. 8. History of AAA.  The patient is being followed as an outpatient.  His other medical problems are all stable.  I will put him oxygen due to history for longstanding tobacco use, although he has now quit.     [name], M.D.     Verlin Grills  D:  07/03/2010  T:  07/03/2010  Job:  [id number]  Electronically Signed by [name] M.D. on 07/31/2010  10:04:46 PM

## 2010-08-02 ENCOUNTER — Encounter: Payer: Self-pay | Admitting: Infectious Disease

## 2010-08-02 ENCOUNTER — Ambulatory Visit (INDEPENDENT_AMBULATORY_CARE_PROVIDER_SITE_OTHER): Payer: Medicare Other | Admitting: Infectious Disease

## 2010-08-02 VITALS — BP 149/76 | HR 78 | Temp 98.1°F | Ht 69.0 in | Wt 199.8 lb

## 2010-08-02 DIAGNOSIS — A4901 Methicillin susceptible Staphylococcus aureus infection, unspecified site: Secondary | ICD-10-CM | POA: Insufficient documentation

## 2010-08-02 DIAGNOSIS — M659 Synovitis and tenosynovitis, unspecified: Secondary | ICD-10-CM | POA: Insufficient documentation

## 2010-08-02 DIAGNOSIS — L03119 Cellulitis of unspecified part of limb: Secondary | ICD-10-CM

## 2010-08-02 DIAGNOSIS — B950 Streptococcus, group A, as the cause of diseases classified elsewhere: Secondary | ICD-10-CM | POA: Insufficient documentation

## 2010-08-02 DIAGNOSIS — G062 Extradural and subdural abscess, unspecified: Secondary | ICD-10-CM | POA: Insufficient documentation

## 2010-08-02 DIAGNOSIS — L02519 Cutaneous abscess of unspecified hand: Secondary | ICD-10-CM | POA: Insufficient documentation

## 2010-08-02 MED ORDER — AMOXICILLIN 500 MG PO CAPS: 500.0000 mg | ORAL_CAPSULE | Freq: Two times a day (BID) | ORAL | Status: AC

## 2010-08-02 NOTE — Progress Notes (Signed)
Subjective:    [name], male    DOB: [birth year], 77 y.o.   MRN: [id number]  HPI  [name] is a 77 year old Caucasian male   with a past medical history significant for rheumatoid arthritis and   infectious disease complications including prior methicillin-sensitive   Staphylococcus aureus epidural abscess, and ankle septic arthritis,   treated with cefazolin, then history of right wrist and hand abscess   again with methicillin-sensitive Staphylococcus aureus, treated with   cefazolin and followed by my partner, Dr. Ninetta Lights.  The patient had been   doing relatively well until the Friday prior to admission when developed   erythema involving his finger this progressed and erythema extended up   into his arm and elbow.  He sought care with his wife at a local   hospital in [facility], but when they were discouraged, ultimately   brought the patient to [facility] on the 8th.  At that time, he   had extensive erythema of his right upper extremity involving the right   middle finger with 2 enter points of middle finger, posterior aspect of   elbow as well and erythema extending from the hand all the way the lower   third of the arm posteriorly.  He was admitted to the hospitalist   service and Hand Surgery was consulted.  The patient was taken to the   operating room where he had a right long finger incision and debridement   of the flexor tendon sheath and right  debridement of the skin   and subcutaneous tissues and drainage of an abscess.  This was done on   the 8th.  He was placed on vancomycin and Zosyn on the 7th and then   changed to vancomycin and cefepime postoperatively.  These antibiotics   were continued until his culture came back with pure group A   Streptococci and he was changed to Rocephin. I saw him in the hospital in April and continued him on intravenous Rocephin. He was discharged on this and comple    more than a month with systemic  intravenous antibiotics. Since then the patient swelling erythema has sided substantially. He does have an area on his right hand the surgical site which began to drain clear material up roughly 10 days ago. His wife called with concern about this and I urged the patient to be seen by surgery the following week. Visualizing the surgical was seen by the hand wound nurse. He continued on Rocephin and continued to have some drainage which sounds like is largely clear not purulent. He has some pain with flexion extension of his digits but he has been more active with them since being discharged from hospital. He denies fevers chills nausea malaise or other systemic symptoms. His PIC line has begun to bleed and and is also causing some distress. We spent over 45 minutes this patient greater than 50% time and recording care and counseling the patient.    PAST MEDICAL HISTORY:   1. Methicillin-sensitive Staphylococcus aureus epidural abscess and       septic arthritis of the ankle status post Ancef therapy.   2. Infected right septic extensor synovitis with abscesses in 2005       with methicillin-sensitive Staphylococcus aureus.   3. Rheumatoid arthritis.   4. Osteoarthritis.   5. Coronary artery disease.   6. Hyperlipidemia.   7. Aortic abdominal aneurysm.   8. Benign prostatic hypertrophy.   9. Gastroesophageal reflux  disease.      PAST SURGICAL HISTORY:   1. I and D of epidural abscess by Dr. Danielle Dess.   2. Nissen fundoplication.   3. History of left hip arthroplasty.      FAMILY HISTORY:  Noncontributory.      SOCIAL HISTORY:  The patient is married, accompanied by his wife and   does not smoke tobacco.  Do not use alcohol and do not use intravenous   drug use.  He was a Engineer, maintenance.        Review of Systems  as per history of present illness otherwise 12 point review of systems is negative.     Objective:   Physical Exam    Patient is alert and oriented x4. HEENT normocephalic with  some rhinophyma pineal. Pupils are equal round reactive to light sclerae anicteric oropharynx clear without erythema or exudate. Neck supple cardiac exam regular rate and rhythm no murmurs rubs rubs heard lungs clear auscultation bilaterally without wheezes or rales abdomen soft nondistended nontender. Extremities with trace edema.  Skin and muscular skeletal his right arm has multiple ecchymoses assess his left. At the site of his incision proximally on his arm is clean dry and intact. There is no fluctuance or purulence about this area. His severe edema in the soft tissues largely has resolved he does have full area of erythema in the palm of his hand where he also had an incision and there was slight amount of clear material visible on examination today. Examination of the PICC line the left arm shows some blood that has dried around the PICC line. There is no purulence or fluctuance seen.  The remainder of the skin exam is pertinent for ecchymoses and bruises. Neurologic exam is shows normal strength and sensation and gait. Psychiatric affect is normal thought is goal-directed a linear period.     Assessment & Plan:

## 2010-08-02 NOTE — Patient Instructions (Signed)
We will dc the PICC line today I am writing for some amoxicillin which I would like you to take for two more weeks Please watch the hand closely if it has not completely healed up in the next two weeks call us and we may wish for the amoxicillin to be refilled But more importantly we need Dr. Levin Erp to be following this closely

## 2010-08-02 NOTE — Assessment & Plan Note (Signed)
We will L. take the patient's PIC line out. We will place the patient on some amoxicillin for 2 additional 2 weeks. I am a bit worried about the site is still draining material in critical the patient follows up with hand surgery as well. I answered the patient is wife to contact me if it if the drainage persists beyond 2 weeks of antibiotics and I'm giving him. We will then potentially length of antibiotic therapy but I really would be worried that point in time the patient might need reexploration of his hand, hand surgery.

## 2010-08-02 NOTE — Assessment & Plan Note (Signed)
The above discussion

## 2010-08-02 NOTE — Assessment & Plan Note (Signed)
Group A strep was the culprit of this particular instance

## 2010-08-03 ENCOUNTER — Encounter (HOSPITAL_COMMUNITY): Payer: Medicare Other

## 2010-08-09 NOTE — Assessment & Plan Note (Signed)
OFFICE VISIT   BENINO, KORINEK  DOB:  04-27-1930                                       12/11/2006  ZOXWR#:[id number]   Mr. Dantes returns for followup regarding his abdominal aortic aneurysm,  which has been followed by Dr. Edwyna Shell since 2003.  At that time it  measured 3.5 cm by CT scan and has gradually enlarged to 3.7 cm by  duplex scanning, which is consistent with today's study.  He also denies  any chest pain but does have dyspnea on exertion on a chronic basis.  He  has had no hemispheric or non-hemispheric TIAs, amaurosis fugax,  diplopia, blurry vision or syncope.  He has had an episode of  thrombophlebitis in the left leg involving some severe varicose veins in  the left pretibial and calf regions but that resolved about 1 year ago.  He has had no history of stasis ulcers or bleeding.   PHYSICAL EXAMINATION:  VITAL SIGNS:  Blood pressure 128/70, heart rate  84, respirations 18, carotid pulses 3+, no audible bruits.  NEUROLOGIC:  Normal.  ABDOMEN:  Obese with no palpable masses noted.  EXTREMITIES:  He has 3+ femoral, 2+ popliteal and 1-2+ posterior tibial  pulses palpable bilaterally.  He has severe varicosities involving the  greater saphenous system of the left leg beginning at the knee extending  down in the pretibial region, down toward the ankle.  There is mild  hyperpigmentation.  No ulceration or infection.   He was reassured regarding his aneurysm.  He does not desire treatment  for the varicose veins at this time.  He will return annually for  followup of his aneurysm on the protocol and see me on a p.r.n. basis.   Quita Skye Hart [place], M.D.  Electronically Signed   JDL/MEDQ  D:  12/11/2006  T:  12/12/2006  Job:  [id number]   cc:   Demetria Pore. Coral Spikes, M.D.

## 2010-08-09 NOTE — Procedures (Signed)
DUPLEX ULTRASOUND OF ABDOMINAL AORTA   INDICATION:  Followup evaluation of known AAA.   HISTORY:  Diabetes:  No.  Cardiac:  Coronary artery disease, PTCA, stent, MI 1999.  Hypertension:  Yes.  Smoking:  No.  Connective Tissue Disorder:  Family History:  No.  Previous Surgery:  No.   DUPLEX EXAM:         AP (cm)                   TRANSVERSE (cm)  Proximal             3.14 cm                   3.46 cm  Mid                  3.84 cm                   3.95 cm  Distal               3.63 cm                   3.49 cm  Right Iliac          Not visualized            Not visualized  Left Iliac           Not visualized            Not visualized   PREVIOUS:  Date:  12/10/2007  AP:  3.9  TRANSVERSE:  3.4   IMPRESSION:  1. Abdominal aortic aneurysm noted with largest measurement of 3.84 x      3.95 cm.  2. This is an increase in size when compared to the previous study.   ___________________________________________  Quita Skye Hart [place], M.D.   EM/MEDQ  D:  02/22/2010  T:  02/22/2010  Job:  [id number]

## 2010-08-09 NOTE — Procedures (Signed)
DUPLEX ULTRASOUND OF ABDOMINAL AORTA   INDICATION:  Follow-up evaluation of known abdominal aortic aneurysm.   HISTORY:  Diabetes:  No.  Cardiac:  History of coronary artery disease, PTCA and stent.  Hypertension:  Yes.  Smoking:  No.  Connective Tissue Disorder:  Family History:  No.  Previous Surgery:  No.   DUPLEX EXAM:         AP (cm)                   TRANSVERSE (cm)  Proximal             2.7 cm                    2.7 cm  Mid                  3.9 cm                    3.4 cm  Distal               2.2 cm                    2.1 cm  Right Iliac          0.7 cm                    0.8 cm  Left Iliac           0.8 cm                    0.8 cm   PREVIOUS:  Date: 12/11/06  AP:  3.5  TRANSVERSE:  3.7   IMPRESSION:  Abdominal aortic aneurysm measurements are stable from  previous study.   ___________________________________________  Quita Skye Hart [place], M.D.   MC/MEDQ  D:  12/10/2007  T:  12/10/2007  Job:  [id number]

## 2010-08-09 NOTE — Assessment & Plan Note (Signed)
OFFICE VISIT   [name], [name]  DOB:  [birth year]                                       02/22/2010  [id number]#:[id number]   Patient returns today for continued follow-up regarding abdominal aortic  aneurysm.  This has been followed for several years, has been in the 3.5  to 4 cm range since 2003.  He denies any new abdominal or back symptoms.   Today I ordered a duplex scan of his abdominal aorta which I have  reviewed and interpreted.  It reveals the aneurysm to be unchanged with  a maximum diameter of 3.95 cm in the mid abdominal aorta.   CHRONIC MEDICAL PROBLEMS:  1. Coronary artery disease.  2. Hyperlipidemia.  3. Hypertension.  4. Negative for diabetes, COPD or stroke.   SOCIAL HISTORY:  He is married.  He is retired.  Quit smoking 25 years  ago.  Does not use alcohol.   FAMILY HISTORY:  Positive for coronary artery disease in a brother,  negative for diabetes and stroke.   REVIEW OF SYSTEMS:  Positive for decreased hearing acuity, diffuse  arthritis, joint pain, arrhythmias.  All other systems are negative in  review of systems.   PHYSICAL EXAMINATION:  Blood pressure 142/78, heart rate 86,  respirations 16.  Generally, he is a well-developed and well-nourished  elderly male in no apparent distress, alert and oriented x3.  HEENT:  Normal for age.  EOMs intact.  He is edentulous.  Lungs are clear to  auscultation.  No rhonchi or wheezing.  Cardiovascular:  Regular rhythm.  No murmurs.  Carotid pulses are 3+.  No audible bruits.  Abdomen is  soft, nontender.  There is a small pulsatile mass in the 4 cm range,  midepigastrium, which is nontender.  Musculoskeletal exam is free of  major deformities.  Neurologic:  Normal.  Skin is free of rashes.  Lower  extremity exam has 3+ femoral and posterior tibial pulses palpable.   I reassured him regarding these findings.  We will follow this on an  annual basis in the vascular lab to check for enlargement  of this 4-cm  aneurysm unless he develops any new symptoms in the interim.     Quita Skye Hart Rochester, M.D.  Electronically Signed   JDL/MEDQ  D:  02/22/2010  T:  02/22/2010  Job:  [id number]

## 2010-08-09 NOTE — Procedures (Signed)
DUPLEX ULTRASOUND OF ABDOMINAL AORTA   INDICATION:  Followup abdominal aortic aneurysm.   HISTORY:  Diabetes:  No.  Cardiac:  CAD, MI, angioplasty and stent 2000.  Hypertension:  Yes.  Smoking:  No.  Connective Tissue Disorder:  Family History:  No.  Previous Surgery:  No.   DUPLEX EXAM:         AP (cm)                   TRANSVERSE (cm)  Proximal             2.83 Cm                   2.65 cm  Mid                  3.52 cm                   3.73 cm  Distal               2.47 cm                   2.56 cm  Right Iliac          0.87 cm                   cm  Left Iliac           0.84 cm                   cm   PREVIOUS:  Date: 06/06/2006  AP:  3.38.  TRANSVERSE:  3.70.   IMPRESSION:  Abdominal aortic aneurysm appears stable from previous  study.   ___________________________________________  Quita Skye Hart [place], M.D.   AS/MEDQ  D:  12/11/2006  T:  12/11/2006  Job:  [id number]

## 2010-08-09 NOTE — Assessment & Plan Note (Signed)
OFFICE VISIT   [name], [name]  DOB:  1930/10/21                                       12/10/2007  [id number]#:[id number]   The patient returns today for followup regarding his infrarenal  abdominal aortic aneurysm which has been followed by Dr. Edwyna Shell for  several years and I have been following for the past few years.  It has  been measured at 3 cm by CT scan in the past and annual duplex scanning  has revealed that it has slowly enlarged to its current size of 3.9 cm.  He denies any abdominal or back symptoms.  Does have mild dyspnea on  exertion which is chronic but no chest pain.  He denies any hemispheric  or non-hemispheric TIAs, amaurosis fugax, diplopia, blurred vision or  syncope.  He has known severe varicose veins in the left greater  saphenous system and has some mild heaviness in the left leg but no  significant pain at this time.  He is ambulating up to about 1 mile per  day.   PHYSICAL EXAM:  Blood pressure 133/90, heart rate 69, respirations 14.  Carotid pulses are 3+ no audible bruits.  Neurologic:  Normal.  Chest:  Clear to auscultation.  Abdomen:  Soft, nontender with no pulsatile mass  noted.  He has 3+ femoral, 3+ popliteal, 2+ dorsalis pedis pulses  bilaterally.   He is reassured regarding these findings and we will continue to follow  him on an annual basis on the protocol for aneurysm followup unless he  should develop any symptoms in the interim.   Quita Skye Hart [place], M.D.  Electronically Signed   JDL/MEDQ  D:  12/10/2007  T:  12/12/2007  Job:  [id number]

## 2010-08-12 NOTE — Consult Note (Signed)
[place]. [facility]  Patient:    [name], [name]. Visit Number: [id number] MRN: [id number]          Service Type: Attending:  Marlan Palau, M.D. Dictated by:   Marlan Palau, M.D. Proc. Date: 12/14/00   CC:         Demetria Pore. Coral Spikes, M.D.  Wayne C. Dorna Bloom, M.D.  [facility], [address]   Consultation Report  HISTORY OF PRESENT ILLNESS:  [name] is an 77 year old, right-handed white male born [birth year], with a history of chronic low back pain.  The patient had a traumatic injury to the right leg, has required hip surgery on the right, had muscle tears of the right thigh due to an accident in the early 1970s.  The patient has had history of back pain over a number of years but has had a significant worsening of back pain over the last week with inability to void the bladder well, has had difficulty with bowel movements for two days.  The patient himself reports no weakness of the right leg, but his wife feels differently.  The patient, however, has noted pain that now goes down all the way to the foot.  Prior to a week ago, pain would go to the thigh level, possibly to the knee.  The patient also reports paresthesias and numbness sensations into the entirety of the right leg over the last week. The patient does note some upper thigh pain on the left but no pain all the way down into the foot.  The patient does note some slight neck pain but no reports of pain, numbness, or weakness into the upper extremities.  This patient comes in to the office at this point for further evaluation.  PAST MEDICAL HISTORY:  1. History of chronic low back pain with worsening of back pain, right leg     pain, bowel and bladder dysfunction.  2. Rheumatoid arthritis.  3. Right hip fracture status post surgery.  4. History of coronary artery disease status post stent placement.  5. History of obesity.  6. History of traumatic  injury to the right leg in 1970s.  7. History of heme-positive stools and diverticulosis.  8. Gastroesophageal reflux disease.  ALLERGIES:  No known allergies.  HABITS:  The patient does not smoke or drink.  MEDICATIONS ON ADMISSION:  1. Folic acid 1 mg a day.  2. Altace 1.25 mg a day.  3. Prednisone 5 mg a day.  4. Lipitor 80 mg q.h.s.  5. Calcium 600 mg plus vitamin D daily.  6. Multivitamins 1 a day.  7. Toprol XL 50 mg daily.  8. Celebrex 200 mg b.i.d.  9. Protonix 40 mg daily. 10. Enteric-coated aspirin 81 mg a day. 11. Colace 100 mg b.i.d. 12. Sublingual nitroglycerin p.r.n. 13. Vicodin p.r.n.  SOCIAL HISTORY:  This patient is married and lives in the [place] area, seven children who are alive and well.  FAMILY MEDICAL HISTORY:  Notable in that mother died with appendicitis. Father died at age 42 with chronic lung disease.  The patient has six brothers and sisters, six half brothers and sisters.  One brother died of heart disease, MI.  One brother died with rheumatoid arthritis, and there is a history of rheumatoid arthritis and heart disease running in the family.  REVIEW OF SYSTEMS:  Notable for some recent chills, no fevers.  The patient denies headache, denies visual field changes, but does note some  oscillopsia over the last day or two in the vertical plane.  The patient again does note some neck pain, denies shortness of breath, chest pain, nausea, vomiting, abdominal pain with the exception of some lower abdominal discomfort.  Denies any numbness on the body.  Denies any problems with blacking out, passing out, dizziness.  PHYSICAL EXAMINATION:  VITAL SIGNS:  Blood pressure 108/67, heart rate 96, respiratory rate 24, temperature afebrile.  GENERAL:  The patient is a moderately obese white male who is alert and cooperative at the time of examination.  HEENT:  Head is atraumatic.  Eyes: Pupils are equal, round and reactive to light.  Disks are flat  bilaterally.  NECK:  Supple.  No carotid bruits noted.  RESPIRATORY:  Clear.  CARDIOVASCULAR:  Regular rate and rhythm.  No obvious murmurs or rubs noted.  ABDOMEN:  Obese.  No obvious evidence of severe bladder distention is noted by palpation.  EXTREMITIES:  Without significant edema.  NEUROLOGIC:  Cranial nerves as above.  Facial symmetry is present.  The patient has good sensation to face to pinprick and soft touch bilaterally. The patient has good strength to facial muscles and muscles to head turning and shoulder shrug bilaterally.  The patient has full visual fields. Extraocular movements are full.  Motor testing is full on all four extremities.  Deep tendon reflexes are symmetric with suppression of ankle jerk reflexes noted bilaterally.  The patient has crossed adductor reflexes bilaterally.  Toes are neutral bilaterally.  The patient again has what appears to be full strength on all fours to direct testing.  The patient notes some decrease in pinprick sensation on the left leg below the knee as compared to the right.  Vibratory sensation is more symmetric in the arms and legs.  The patient has good finger-nose-finger and toe-to-finger bilaterally.  No pronator drift is seen.  The patient is not ambulated.  No significant severe pain with internal or external rotation of the hip is noted on the right.  IMPRESSION: 1. Low back pain. 2. Right leg pain. 3. Bowel and bladder disturbance.  This patient needs to be evaluated for possible significant lumbosacral spinal stenosis, rule out involvement of conus medullaris.  The patient has no observable weakness, however, in the lower extremities.  Need to also consider possibility of pelvic process at work resulting in severe pain.  If MRI scan  of the lumbosacral spine is unremarkable, evaluation of the thoracic spine and pelvis may need to be performed.  The patient does appear to have cross abductor reflexes of both  legs but absence of ankle jerk reflexes.  PLAN: 1. MRI scan of the lumbosacral spine. 2. X-ray of the right hip. 3. Analgesics for pain. 4. Foley catheter. 5. Will follow clinical course while in house or just neurological workup    depending upon results of the above. Dictated by:   Marlan Palau, M.D. Attending:  Marlan Palau, M.D. DD:  12/14/00 TD:  12/14/00 Job: [id number]

## 2010-08-12 NOTE — Op Note (Signed)
[name], [name]               ACCOUNT NO.:  [id number]   MEDICAL RECORD NO.:  [id number]          PATIENT TYPE:  INP   LOCATION:  5036                         FACILITY:  [facility]   PHYSICIAN:  Feliberto Gottron. Turner Daniels, M.D.   DATE OF BIRTH:  May 14, 1930   DATE OF PROCEDURE:  06/06/2004  DATE OF DISCHARGE:                                 OPERATIVE REPORT   PREOPERATIVE DIAGNOSIS:  End-stage arthritis, left hip.   POSTOPERATIVE DIAGNOSIS:  End-stage arthritis, left hip.   PROCEDURE:  Left hip total arthroplasty using DePuy/SROM components, a 58 mm  ASR shell, a 51 mm NK+0 head, a 22 x 17 x 42 SROM stem, a 22 D small cone.   SURGEON:  Feliberto Gottron. Turner Daniels, M.D.   ASSISTANTLaural Benes. Jannet Mantis.   ANESTHETIC:  Endotracheal.   ESTIMATED BLOOD LOSS:  300 cc.   FLUID REPLACEMENT:  1 L crystalloid.   DRAINS PLACED:  None.   TOURNIQUET TIME:  None.   INDICATIONS FOR PROCEDURE:  77 year old gentleman who underwent removal of  hardware and a right total hip by me about four years ago and has done very  well.  He now has end-stage arthritis the left hip and has failed  conservative treatment.  He is well aware the risks, benefits of hip  arthroplasty and desires elective left total hip arthroplasty for bone-on-  bone arthritic changes with severe unremitting pain.   DESCRIPTION OF PROCEDURE:  The patient identified by armband, taken the  operating room at [facility] main hospital were appropriate site monitors were  attached, general endotracheal anesthesia induced with the patient in supine  position and he was concomitantly given a gram of Ancef IV.  He was then  rolled into the right lateral decubitus position and fixed there with a  Stulberg Mark II pelvic clamp and a left lower extremity prepped and draped  in usual sterile fashion from the ankle the hemipelvis. The skin along the  lateral hip and thigh infiltrated with 10 cc of half percent Marcaine and  epinephrine solution. We then made a 15  cm incision centered over the  greater trochanter through the skin and subcutaneous tissue down to the  level of the IT band which was cut in line with the skin incision exposing  the greater trochanter. A Cobra retractor was placed between the gluteus  minimus and the superior hip joint capsule superiorly and a second Cobra  between the quadratus femoris and the anterior joint capsule inferiorly. We  then tagged the piriformis and short external rotators with #2 Ethibond  suture and cut them off their insertion on the intertrochanteric crest  exposing the posterior aspect of hip joint capsule which was then developed  into an acetabular based flap and likewise tagged to #2 Ethibond sutures.  The hip was flexed and internally rotated by my assistant dislocating the  femoral head and a standard neck cut was performed one fingerbreadth above  the lesser trochanter without difficulty. The proximal femur was then  translated anteriorly using a Homan retractor over the anterior superior  column.  A Cobra  retractor was placed just below the cotyloid notch and  posterior superior and posterior inferior wing retractors were placed  enhancing our acetabular exposure and allowing removal of the labrum.  The  acetabulum was noted bone-on-bone arthritic changes was then sequentially  reamed up to 57 mm basket reamer without difficulty just getting into the  subchondral bone.  Peripheral osteophytes were removed. We then hammered  into place a 58 mm ASR cup in 45 degrees of abduction and 20 degrees of  anteversion.  The cup drove nicely with no evidence of loosening whatsoever.  The hip was then flexed, internally rotated exposing the proximal femur  which was then entered with a box cutting chisel, the initiator reamer and  then sequentially axially reamed up to a 17.5 axial reamer obtaining good  cortical chatter. We then sounded the proximal femur with the cone removing  tool and there were no  perforations of the femur noted. We then conically  reamed up to a 22 D cone, followed by calcar milling to a small calcar.  A  22 D small trial was then hammered into the proximal femur followed by 17  stem, 42 neck and NK+0 51 mm ball.  The hip was reduced. Excellent stability  was noted to 90 flexion 70 of internal rotation and could not be dislocated  anteriorly in extension. The patient easily came to full extension. At this  point trial components removed. A 22 D small cone was hammered into place  followed by a 22 x 17 x 42 stem with about 3 degrees more anteversion than  the cone, followed by an NK +0 51 mm ball.  Hip was reduced. Stability was  once again noted to be excellent. The wound was once again irrigated out  with normal saline solution. Capsular flap and short external rotators  repaired back to the intertrochanteric crest through drill holes using #2  Ethilon suture. The IT band was closed with running #1 Vicryl suture. The  subcutaneous tissue with 0 and 2-0 undyed Vicryl suture and the skin with  running interlocking 3-0 nylon suture. A dressing of Xeroform, 4 x 4  dressing sponges, Hypafix tape was applied. The patient was unclamped, laid  supine, awakened and taken to recovery room without difficulty.      FJR/MEDQ  D:  06/06/2004  T:  06/06/2004  Job:  [id number]

## 2010-08-12 NOTE — Discharge Summary (Signed)
[place]. [facility]  Patient:    [name], [name]                     MRN: [id number] Adm. Date:  06/08/99 Disc. Date: 06/11/99 Attending:  Meade Maw, M.D. Dictator:   Anselm Lis, N.P. CC:         [name] Pore. [name], M.D.                           Discharge Summary  PRIMARY CARE [name]:  [name] Pore. [name], M.D.  CONSULTATIONS:  Cardiologist (interventionist) was Dr. Verdis [name].  PROCEDURES:  (June 08, 1999) Dr. Verdis [name] with X-TRACT thrombectomy, followed by stent of RCA.  Reduction of stenosis from 100% mid RCA to less than 10% after stent/X-TRACT.  DISCHARGE DIAGNOSES: 1. Coronary atherosclerotic heart disease.    a. Ruled in for acute inferior myocardial infarction with peak CK of 766       and MB fraction of 125.  Troponin-I of 1.71.    b. (June 08, 1999) X-TRACT thrombectomy, followed by stent of mid right       coronary artery.  Left coronary artery without significant disease.       Circumflex with mid ectasia before two obtuse marginals.    c. Left ventricular dysfunction secondary to myocardial infarction with       inferior hypokinesis.  Ejection fraction of 55%. 2. History of gastroesophageal reflux disease; on Protonix (new medication). 3. History of dyslipidemia; Lipitor. 4. Mild congestive heart failure post myocardial infarction; resolved with    Lasix. 5. Anxiety peri-infarct; good management with p.r.n. Xanax.  PLAN:  The patient is discharged home in stable condition.  DISCHARGE MEDICATIONS: 1. (New) Plavix 75 mg 1 p.o. q.d. take with food. 2. (New) Toprol XL 50 mg p.o. q.d. 3. (New) Altace 1.25 mg p.o. q.d. 4. (New) Enteric-coated aspirin 325 mg once daily. 5. Lipitor 10 mg p.o. q.d. 6. Prednisone 5 mg p.o. q.d. 7. Multivitamins, folic acid as before. 8. Celebrex 200 mg p.o. q.d. 9. (New) Protonix 40 mg p.o. q.d.  ACTIVITY:  No heavy lifting or pushing until after seen in clinic by [name].  DIET:  Low  fat/low cholesterol.  WOUND CARE:  May shower.  SPECIAL INSTRUCTIONS:  The patient is to call our clinic if there is a large amount of swelling or bruising in the groin area.  FOLLOW-UP:  Wednesday, June 22, 1999 at 1:15 p.m. with Dr. Hillary [name].  BRIEF HISTORY:  (Please also see dictated H&P).  [name] is a pleasant 77 year old male who first noted arm discomfort two days prior to admission. It was described as a persistent elbow pain associated with shortness of breath.  The pain persisted throughout the day and then progressed the day prior to admission to involving his chest.  On the morning of admission, he headed to his primary M.D.s office with continued discomfort not particularly exacerbated with activity.  There was some associated shortness of breath that may not have been above what his baseline was.  PAST MEDICAL HISTORY: 1. Cholelithiasis. 2. GERD. 3. Rheumatoid arthritis. 4. Hyperlipidemia. 5. History of blood in stool with past barium enema revealing diverticula. 6. Pulmonary emphysema with bullous disease by chest x-ray February 1999. 7. Low back pain of uncertain etiology.  HOSPITAL COURSE:  The patient was admitted initially by Dr. Coral [name]. Cardiology consultation obtained from Dr. Hillary [name].  Initial  plans for stress Cardiolite (in view of atypical nature of chest discomfort) were abandoned and the patient was taken directly for a cardiac catheterization when first cardiac enzyme returned positive with CK of 602, MB fraction of 103.4, and troponin-I of 0.44.  The patient was pain free, though his EKG revealed early R wave progression, suspicious for posterior MI.  On June 08, 1999, he was taken to the catheterization lab by Dr. Verdis [name].  He was premedicated with 150 mg of Plavix and had been on IV nitrates and heparin since admission to the ER.  Cardiac catheterization with results as follows:  1. LV-gram:  Inferior hypokinesis with an  ejection fraction of 55%. 2. Peri-coronary angiography:    a. Left main:  Okay.    b. LAD:  50% after diagonal #1 and SP #1.  Collaterals to RCA.    c. Circumflex:  Ectasia mid before two OMs.    d. RCA:  100% proximal with large clot burden. 3. Procedure:  Percutaneous intervention:  100% mid RCA with clot reduced to    less than 10% after use of X-TRACT and subsequent stent.  The patient was    initiated on completed course of IV Integrilin.  Also initiated on Plavix    and aspirin, as well as initiation of beta blocker therapy.  The first hospital day, the patient was no longer with complaints of chest, elbow, nor shoulder discomfort.  He was started on p.r.n. Xanax for symptoms of anxiety.  His lungs were clear.  On the second hospital day, he did have some bibasilar crackles, which were treated with Lasix.  The patient ambulated up and about with cardiac rehab; dietary counseling was initiated by registered dietician.  On hospital day #3, the patient was doing well without complaints.  He was referred to [facility] for Phase II outpatient cardiac rehab.  Discharged home in stable and improved condition.  LABORATORY DATA:  Serial cardiac enzymes:  First CK of 603 with MB fraction of 103.4 and troponin-I 0.44.  Second CK of 760 with MB fraction of 125.2 and troponin-I of 1.71.  Third CK of 607 with MB fraction of 94.3.  Fourth CK 383 with MB fraction of 44.3.  Admission coags within normal range.  Admission sodium of 140, potassium 4.1, chloride 110, CO2 22, glucose 96, BUN 18, creatinine 0.8, calcium 9.5.  LFTs within normal range, though AST was elevated at 73; 36 at time of discharge.  Other LFTs were within normal range. CBC revealed admission hemoglobin 14.6; 11.9 at time of discharge.  Platelets of 250, WBC 8.3.  Admission chest x-ray revealed cardiomegaly and suggestion of mild pulmonary edema.  Admission EKG revealed ______ PVCs; T wave inversion with early R wave  progression. DD:  07/13/99 TD:  07/13/99 Job: [id number]

## 2010-08-12 NOTE — Op Note (Signed)
[name], [name]                         ACCOUNT NO.:  [id number]   MEDICAL RECORD NO.:  [id number]                   PATIENT TYPE:  INP   LOCATION:  5040                                 FACILITY:  [facility]   PHYSICIAN:  Artist Pais. Mina Marble, M.D.           DATE OF BIRTH:  12-15-1930   DATE OF PROCEDURE:  10/12/2003  DATE OF DISCHARGE:                                 OPERATIVE REPORT   PREOPERATIVE DIAGNOSIS:  Right wrist septic extensor synovitis with deep  abscesses.   POSTOPERATIVE DIAGNOSIS:  Right wrist septic extensor synovitis with deep  abscesses.   PROCEDURE:  Irrigation and debridement of above with excision of extensor  synovium and bursa, right hand and wrist.   SURGEON:  Artist Pais. Mina Marble, M.D.   ASSISTANT:  None.   ANESTHESIA:  General.   TOURNIQUET TIME:  30 minutes.   COMPLICATIONS:  None.   DRAINS:  None.   PACKS:  The wound was packed open.   SPECIMENS:  Cultures x 2 were sent.   DESCRIPTION OF PROCEDURE:  The patient was taken to the operating room.  After the induction of adequate general anesthesia, the right upper  extremity was prepped and draped in the usual sterile fashion.  An Esmarch  was used to exsanguinate the limb.  The tourniquet was inflated to 250 mmHg.  At this point in time, a longitudinal incision was made starting at the long  finger metacarpal phalangeal joint on the ulnar side going proximally across  the wrist.  The incision was taken down through the skin and subcutaneous  tissues.  Purulent material was encountered over the metacarpal phalangeal  joint of the long finger and several pockets along this longitudinal course  were encountered, particularly in the area of the extensor tendons of the  fourth compartment.  The fourth compartment was opened, the retinaculum was  split.  There was a significant amount of purulent material there that went  all the way back into the musculotendinous junction.  The purulent material  was cultured x 2.  The wound was then thoroughly irrigated with 6 liters of  normal saline using a pulse lavage followed by significant extensor  synovectomy.  The wound was then packed open with Betadine soaked 4 by 4s  and placing a sterile dressing of 4 by 4s, fluffs, Coban wrapping, and a  compressive dressing.  The patient tolerated the procedure well and went to  the recovery room in stable condition.                                              Artist Pais Mina Marble, M.D.   MAW/MEDQ  D:  10/12/2003  T:  10/12/2003  Job:  [id number]

## 2010-08-12 NOTE — Consult Note (Signed)
[place]  Patient:    [name], [name] Visit Number: [id number] MRN: [id number]          Service Type: MED Location: 5500 [id number] Attending Physician:  Eliezer Champagne Dictated by:   Stefani Dama, M.D. Proc. Date: 12/14/00 Admit Date:  12/14/2000                            Consultation Report  REFERRING PHYSICIAN:  Marlan Palau, M.D.  REASON FOR REQUEST:  Epidural abscess, lumbar.  HISTORY OF PRESENT ILLNESS:  Patient is a 77 year old right-handed individual who has chronic back pain but over the past week or so has had an increased amount of back pain which caused him to come to the hospital.  Workup included an MRI of the lumbar spine which demonstrates presence of an epidural abscess at the L1-2 and also at the L5-S1 areas with doral compression of the spinal canal and sac.  Patient also has evidence of increased enhancement all the way up the dura through the thoracic spine.  He denies any weakness in his legs but notes that he has had some difficulty passing his urine today such that he can only dribble his urine, and a Foley catheter was placed which retrieved a large amount of urine.  Furthermore, the patient has complained of severe back pain, with the worse pain being in the region of both hips.  PAST MEDICAL HISTORY:  He notes is fairly unremarkable.  He feels himself to be quite healthy, though he does have a history of rheumatoid arthritis and he has been on low-dose prednisone for a prolonged period of time.  Patient also notes that he has some gastroesophageal reflux disease - he has been on Protonix.  Gives a history of diverticulosis diagnosed by Dr. Sherin Quarry. Additionally, he notes that he has an old right hip fracture that was operated on some years ago.  MEDICATIONS:  Patient notes that he uses chronic medications including Darvocet, Celebrex, and Flexeril.  He has been on some low-dose prednisone in the order of 2-5  mg.  SYSTEMS REVIEW:  Is not notable for any complaints other than in the items for the history of present illness which include back pain and lower extremity pain on the right leg.  PHYSICAL EXAMINATION AT THIS TIME:  GENERAL:  He is an alert, oriented, cooperative individual in no overt distress.  NEUROLOGIC:  He will stand straight and erect without difficulty.  He cannot toe or heel walks.  Flexion forward tends to aggravate pain at the lumbosacral junction.  Palpation and percussion reproduces pain at the lumbosacral junction.  There is no pain or tenderness at the thoracolumbar junction.  His sensation in the distal lower extremities reveals intact pin and light touch in both distal lower extremities.  Straight leg raising reproduces back pain primarily 30 degrees in either lower extremity.  Patricks maneuver is negative.  Upper extremity strength and reflexes are intact and normal.  IMPRESSION:  The patient has evidence of epidural abscess at the level of L1-L2 and also at the level of L5-S1.  He is now to undergo surgical evacuation.  However, because the patient had a full dinner this evening and he is not in any acute distress, will hold off until the morning to do laminotomies for evacuation of this process.  He has already been started on Ancef. Dictated by:   Stefani Dama, M.D. Attending Physician:  Eliezer Champagne DD:  12/14/00 TD:  12/15/00 Job: [id number]

## 2010-08-12 NOTE — Discharge Summary (Signed)
[place]  Patient:    [name], [name] Visit Number: [id number] MRN: [id number]          Service Type: MED Location: 3000 [id number] Attending Physician:  Eliezer Champagne Dictated by:   Stefani Dama, M.D. Admit Date:  12/14/2000 Disc. Date: 12/18/00   CC:         Demetria Pore. Coral Spikes, M.D.  Infectious Disease Service   Discharge Summary  ADMITTING DIAGNOSIS:  Intractable back pain with cauda equina syndrome.  DISCHARGE DIAGNOSES: 1. Spinal epidural abscess with Staphylococcus aureus sensitive to Ancef. 2. Rheumatoid arthritis. 3. Urinary retention. 4. Status post right hip fracture. 5. Left ankle pain and swelling.  CONDITION ON DISCHARGE:  Improving.  HOSPITAL COURSE:  The patient is a 77 year old individual who had developed back pain over a period of about two weeks.  It became progressively so severe that he could not be ambulated.  The patient remained neurologically stable until the day of admission when he had problems with urinary retention.  He was brought into the hospital, had intermittent straight catheterizations, and an MRI of the lumbar spine demonstrated the patient had a large epidural abscess along the area of T12/L1-L2 with extension going down as far as L5 and inflammation and enhancement of the dura all the way up the thoracic spine. He was taken to the operating room on September 21 on an urgent basis and underwent laminotomies and drainage of epidural pus from the L1-L2 region.  L5 was also explored and this revealed some grumous granulation tissue but no frank pus.  The patient neurologically improved; his back pain became considerably better.  Foley catheter which had been placed at the time of admission was removed on postoperative day #3.  His incisions remained clean and dry.  Cultures reveal a Staphylococcus aureus that is sensitive to Ancef and the patient has been on Ancef and vancomycin since the time of  surgery. His rheumatoid arthritis, which was treated with methotrexate and prednisone, is now being treated with prednisone.  He was given some stress doses at the time of surgery and he is now being tapered and weaned from 30 mg, being discharged home on 20 mg a day - 10 in the morning and 10 at night.  He has been advised to wean by 5 mg every five days until he is on a steady state dose of 5 mg of pregnancy.  Echocardiogram was preformed yesterday; however, the results are pending at the time of discharge.  Left ankle MRI also was performed yesterday.  This result at the time of this dictation is still pending.  He will be seen in my office in three weeks time for follow up his sed rate, CBC, and the status of his back.  He will be followed up by his primary care physician, Dr. Lennox Pippins, for other medical intervention and treatment of his rheumatoid arthritis. Dictated by:   Stefani Dama, M.D. Attending Physician:  Eliezer Champagne DD:  12/18/00 TD:  12/18/00 Job: [id number]

## 2010-08-12 NOTE — Op Note (Signed)
[facility]  Patient:    [name], [name] Visit Number: [id number] MRN: [id number]          Service Type: NES Location: NESC Attending Physician:  Thermon Leyland Dictated by:   Barron Alvine, M.D. Proc. Date: 06/12/01 Admit Date:  06/12/2001   CC:         [name]. [name], M.D.   Operative Report  PREOPERATIVE DIAGNOSIS:  Gross hematuria.  POSTOPERATIVE DIAGNOSES: 1. Gross hematuria. 2. Hematuria from right renal unit.  PROCEDURE: 1. Cystoscopy. 2. Retrograde pyelography. 3. Rigid and flexible ureteroscopy. 4. Ureteral brushings and washings and Double J stent placement.  SURGEON:  Barron Alvine, M.D.  ANESTHESIA:  General.  INDICATIONS:  [name] is a 77 year old male. He has had some intermittent gross hematuria. He has had several upper tract imaging studies including an IVP in the past as well as a CT scan with and without contrast. This showed mild bladder thickening but no other obvious pathology. He has had cytology twice which has shown some mild atypia but nothing obvious for a malignancy. Cystoscopy in the office was somewhat limited because of active bleeding. We initially thought there was some oozing from the bladder neck but did not feel that we had adequately evaluated him. We had recommended for some time that he consider reevaluation under anesthesia for better visualization and more thorough assessment of his hematuria. He initially declined that recommendation. More recently, he has had increased total hematuria and did allow for the recommendation for repeat evaluation.  TECHNIQUE AND FINDINGS:  The patient was brought to the operating room where he had a successful induction of general anesthesia. He was placed in lithotomy position and prepped and draped in the usual manner. Cystoscopy revealed unremarkable anterior urethra. His prostate showed moderate trilobar hyperplasia but I saw no active bleeding from the  bladder neck or prostate. The bladder itself was relatively clear. The mucosa showed slight diffuse erythema but certainly nothing really suspicious or worrisome. I saw no evidence of bladder tumor on careful endoscopy of the bladder with the both the forward looking as well as right angle lenses. We carefully inspected both ureteral orifices. Urine from the left side was entirely clear but it was obvious from the right side the urine was indeed bloody on the ureteral jet. Retrograde pyelogram was performed. This showed some kinking of the distal right ureter with some slight narrowings but no obvious filling defect or obstruction. The more proximal and mid portions of the ureter were unremarkable and really the renal pelvis and caliceal system showed no obvious abnormalities. A guidewire was able to be placed up to the right renal pelvis. We attempted rigid ureteroscopy but the ureteral orifice was quite tight and therefore we used a One-step dilator to dilate the most distal few centimeters of the ureter. We were then able to place the rigid ureteroscope in without difficulty. There was a slight area of increased erythema and slight heaping up of the mucosa distally. There was not an obvious tumor, however. We did do some brushings of this area. The rest of the ureter was unremarkable. We elected to go ahead with flexible ureteroscopy to inspect the entire renal pelvis and collecting system. An axis sheath was placed and we used a mini flexible ureteroscope. There were a few clots in the renal pelvis. We did do washings for cytology. I carefully inspected each caliceal unit and did not see obvious active bleeding, or did I see any obvious transitional cell  carcinoma tumors. We marked and counted the caliceal system and appeared to be able to access each one of these with the flexible ureteroscope. Because of the significant manipulation and dilation as well as the axis sheath, I  went ahead and placed a Double J stent. This was left with a dangle string and placed over the guidewire with fluoroscopic control.  In summary, we see a patient who has a history of gross hematuria who has bloody urine from his right ureteral orifice. We cannot identify obvious pathology endoscopically to day but do have cytologies and brushings pending. Additional evaluation and/or treatment will depend on those results. The patient appeared to tolerate the procedure well. There were no obvious complications. Dictated by:   Barron Alvine, M.D. Attending Physician:  Thermon Leyland DD:  06/12/01 TD:  06/12/01 Job: [id number]

## 2010-08-12 NOTE — Consult Note (Signed)
[place]  Patient:    [name], [name]                      MRN: [id number] Adm. Date:  [id number] Attending:  Genene Churn CC:         Demetria Pore. Coral Spikes, M.D.                          Consultation Report  REFERRING PHYSICIAN:  Demetria Pore. Coral Spikes, M.D.  REASON FOR CONSULTATION:  Chest pain.  HISTORY OF PRESENT ILLNESS:  [name] is a 77 year old gentleman, who first noted a hurting in his arm on June 06, 1999.  The pain was described as a persistent elbow pain associated with shortness of breath.  No nausea and no vomiting.  The pain was persistent throughout the day and the pain progressed on June 07, 1999 to involve his chest.  It again ranged from 2 out of 10 to an 8 ut of 10 scale.  The pain interrupted his sleep.  Tums were taken without relief. He denies nausea and vomiting.  He was active and continued to work on Tuesday, with arms over his head much of the day, tearing down a cement wall.  The activity did not provoke the pain.  He presented to the office this morning when the pain was persistent and unrelieved.  He reports a similar episode three weeks ago, which  persisted throughout the day, without treatment given.  There have been no aggravating or alleviating factors noted.  The pain has been associated with shortness of breath, but the patient feels somewhat short of breath at all times. Coronary risk factors include remote history of tobacco use; he stopped smoking in 1987, a family of a brother having a myocardial infarction at age 29, positive cholesterol, positive age, positive male sex.  There has been no history of diabetes or hypertension.  PAST MEDICAL HISTORY: 1.  Cholelithiasis. 2.  GERD. 3.  Rheumatoid arthritis. 4.  Hyperlipidemia. 5.  Positive blood in the stool.  Flexible sigmoidoscopy and barium enema in 1992     unremarkable.  The barium enema revealed a diverticula.  He was again  noted to     have blood in the stool in 1995 and declined further evaluation.  In 1996     colonoscopy was performed showing left-sided diverticula. 6.  Chest x-ray in February 1999 revealed pulmonary emphysema with bullous disease. 7.  Low back pain, questionable etiology.  ALLERGIES:   No known drug allergies.  CURRENT MEDICATIONS: 1.  Lipitor 10 mg p.o. q.d. 2.  Prednisone 5 mg p.o. q.d. 3.  Folic acid 1 mg p.o. q.d. 4.  Prilosec 20 mg p.o. q.d. 5.  Prednisone 5 mg p.o. q.d. 6.  Centrum Silver 1 q.d. 7.  Calcium 600 mg with vitamin D 1 q.d. 8.  Celebrex 200 mg q.d. 9.  Tylenol p.r.n. 10. Methotrexate is on hold for his rheumatoid arthritis. 11. Enteric-coated aspirin. 12. He was started on IV nitroglycerin and IV heparin per pharmacy protocol.  PAST SURGICAL HISTORY:  Significant for molar extraction in 1992.  PAST MEDICAL HISTORY:  Past injuries are as related to right hip fracture in 1971, status post fracture of the pelvis in 1958.  SOCIAL HISTORY:  He is married and resides with his wife.  Stopped smoking in 1987. No alcohol use since 1987.  He is a Engineer, maintenance, and is currently  working as a  Music therapist.  REVIEW OF SYSTEMS:  There has been no history of PND, orthopnea, tachy arrhythmia, no recent bright red blood per rectum, no recent black stools, no hematemesis, o nausea, no vomiting, no pedal edema.  No seizures.  PHYSICAL EXAMINATION:  GENERAL:  Middle-aged male, in no acute distress.  HEENT:  Unremarkable.  NECK: No carotid bruits noted, no neck vein distention noted.  Thyroid not palpable.  VITAL SIGNS:  Blood pressure 116/70, heart rate 74.  Telemetry reveals sinus rhythm with an occasional PVC.  PULMONARY:  Breath sounds equal and clear to auscultation.  CARDIOVASCULAR:  Normal S1 and S2, regular rate and rhythm.  No murmurs, rubs, r gallops noted.  ABDOMEN:  Soft, benign, nontender.  No masses noted.  EXTREMITIES:  No peripheral edema.   Distal pulses are palpable.  SKIN:  Warm and dry.  NEUROLOGIC:  Nonfocal.  LABORATORY DATA:  Laboratories are pending at this time.  ECG reveals a normal sinus rhythm, normal ECG.  IMPRESSION: 1.  Chest pain with atypical features in that it is not aggravated by exercise, has     been persistent for more than 36 hours.  Further work-up will be pending the     outcome of the cardiac enzymes.  If cardiac enzymes are negative he will be  scheduled for an adenosine Cardiolite.  Should his enzymes return positive a     left heart catheterization will be performed.  This has been discussed with the     patient.  Agree with IV heparin and IV nitroglycerin. 2.  Dyslipidemia.  Lipid profile, if not recently performed.  Thank you for this consultation.  I will discuss the patient further with you. DD:  06/08/99 TD:  06/08/99 Job: [id number]

## 2010-08-12 NOTE — H&P (Signed)
[name], [name]                         ACCOUNT NO.:  [id number]   MEDICAL RECORD NO.:  [id number]                   PATIENT TYPE:  EMS   LOCATION:  MAJO                                 FACILITY:  [facility]   PHYSICIAN:  Hal T. Stoneking, M.D.              DATE OF BIRTH:  [birth year]   DATE OF ADMISSION:  10/11/2003  DATE OF DISCHARGE:                                HISTORY & PHYSICAL   IDENTIFYING DATA:  [name] is a very nice 77 year old white male followed  by Dr. Demetria Pore. Levitin.  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  His hand started hurting and  swelling approximately 2 days ago and has gotten progressively worse, red  and painful.  He has denied any shaking chills or fever.  He does have a  history of rheumatoid arthritis, is on prednisone and Motrin.   ALLERGIES:  MORPHINE caused confusion.   CURRENT MEDICATIONS:  1. Protonix 40 mg a day.  2. Altace 2.5 mg a day.  3. Lipitor 80 mg a day.  4. Zetia 10 mg a day.  5. Toprol -- he is not sure about the dose.  6. Motrin 800 mg b.i.d.  7. Prednisone 5 mg a day.  8. Flomax 0.4 mg a day.  9. Aspirin 81 mg a day.  10.      Multivitamin once a day.  11.      Folic acid 1 mg a day.   PAST MEDICAL HISTORY:  Past medical history remarkable for:  1. Rheumatoid arthritis.  2. Hypertension.  3. Coronary artery disease with an MI in 2002.  4. BPH.  5. Hypercholesterolemia.   PREVIOUS SURGERIES:  1. He had a hiatal hernia repair.  2. He has had a right hip replacement.  3. He has had back surgery.   FAMILY HISTORY:  Brother died of an MI.   SOCIAL HISTORY:  He is married.  He has 7 children.  He is still working as  a Music therapist.  Stopped cigarettes a number of years ago.  No alcohol intake.   REVIEW OF SYSTEMS:  No headache, no change in his vision, no trouble with  his hearing.  No chest pain, no shortness of breath.  No abdominal pain.  Bowels moving  normally.   PHYSICAL EXAM:  VITAL SIGNS:  Temperature 97, blood pressure 123/79,  respiratory rate 20, O2 saturation 96%.  HEENT:  Pupils equal, round and reactive to light.  Extraocular muscles  intact.  Fundi appear normal.  Disks sharp.  TMs normal.  Oropharynx  edentulous.  Mucosa moist.  LUNGS:  Lungs revealed a few crackles at the right base.  HEART:  Regular rate and rhythm without murmur.  ABDOMEN:  Abdomen soft.  He had no masses felt deep but he does have  multiple superficial masses consistent with  lipomas and he states that they  have been there for years.  EXTREMITY EXAM:  He does have a laceration on the extensor surface measuring  approximately a centimeter and the right hand itself is quite swollen with  erythema going down into the forearm.  NEUROLOGIC:  Normal.   LABORATORY DATA:  X-ray of the right hand reveals degenerative changes only.   CBC:  White count 9600, hemoglobin 13.3, platelet count 199,000.  Sodium  142, potassium 4.0, chloride 113, bicarb 26, glucose 103, BUN 60, creatinine  1.0, calcium 8.8.   ASSESSMENT:  Cellulitis, right hand, secondary to abrasion/laceration over  the extensor surface, likely Staphylococcus and Streptococcus.   PLAN:  Admit, elevate and IV Ancef.  Follow clinic course.                                                Hal T. Pete Glatter, M.D.    HTS/MEDQ  D:  10/12/2003  T:  10/12/2003  Job:  [id number]   cc:   Demetria Pore. Coral Spikes, M.D.  [address]  [address]  [place]  [place] [address]  Fax: [phone]

## 2010-08-12 NOTE — Discharge Summary (Signed)
NAMEJONEL, [name]                           ACCOUNT NO.:  [id number]   MEDICAL RECORD NO.:  [id number]                    PATIENT TYPE:   LOCATION:                                       FACILITY:   PHYSICIAN:  Adolph Pollack, M.D.            DATE OF BIRTH:   DATE OF ADMISSION:  DATE OF DISCHARGE:                                 DISCHARGE SUMMARY   PRINCIPAL DISCHARGE DIAGNOSIS:  Giant paraesophageal hernia with  gastroesophageal reflux disease.   SECONDARY DIAGNOSES:  1. Coronary artery disease.  2. Rheumatoid arthritis.  3. Lumbar epidural abscess.  4. Benign prostatic hypertrophy.  5. Hypertension.  6. Hyperlipidemia.  7. Hematuria.   PROCEDURE:  1. Laparoscopic repair of paraesophageal hernia with Nissan fundoplication.  2. Cystoscopy.  3. Right ureteroscopy.  4. Breast biopsies.  5. Right Stent placement by Dr. Barron Alvine.   REASON FOR ADMISSION:  [name] is a 77 year old male with progressive  dysphagia who was found to have a very large paraesophageal hernia. He also  has gastroesophageal reflux disease. Of note was that he had had hematuria  for some time and has been undergoing a very thorough evaluation with Dr.  Isabel Caprice and has a DNA of some cell that suggest that he has some sort of GU  malignancy. He was admitted for the above procedures electively.   HOSPITAL COURSE:  He underwent both of the above procedures and actually did  fairly well with them. We placed him on IV Lopressor and Nitroglycerin paste  to control his blood pressure and on low dose Solu-Medrol for his rheumatoid  arthritis on his first postoperative day. He began on a liquid diet on his  second postoperative day and was ambulatory. His Foley was removed by  urology. A repeat CT scan was done, looking for evidence of urologic  malignancy but nothing specific was found. By his third postoperative day,  he was ambulatory, tolerating his diet, and his incision were clean and  intact.  He was ready to be discharged.   DISPOSITION:  Discharged to home on December 13, 2001 in satisfactory  condition.   DISCHARGE MEDICATIONS:  1. He was told to continue all his home medications except for his Protonix  2. Darvocet as needed for pain.  3. Gas-X for gas bloating.   DIET:  To be maintained on a liquid type of diet.   ACTIVITY:  He was given restrictions.   FOLLOW UP:  He will come back and see me in follow-up in two to three weeks.  He will also arrange for follow-up with Dr. Isabel Caprice. He knows to call if he  has any problems.  Adolph Pollack, M.D.    Kari Baars  D:  12/24/2001  T:  12/24/2001  Job:  [id number]   cc:   Ines Bloomer, M.D.  [address]  [place]  [place] [address]  Fax: [phone]   Demetria Pore. Coral Spikes, M.D.  [address]  [place]  [place] [address]  Fax: [phone]   Candace Cruise, M.D.   Tasia Catchings, M.D.  [address]  [place]  [place] [address]  Fax: [phone]   Valetta Fuller, M.D.

## 2010-08-12 NOTE — Discharge Summary (Signed)
[name], [name]                         ACCOUNT NO.:  [id number]   MEDICAL RECORD NO.:  [id number]                   PATIENT TYPE:  INP   LOCATION:  5002                                 FACILITY:  [facility]   PHYSICIAN:  Feliberto Gottron. Turner Daniels, M.D.                DATE OF BIRTH:  22-Feb-1931   DATE OF ADMISSION:  04/14/2002  DATE OF DISCHARGE:  04/17/2002                                 DISCHARGE SUMMARY   PRIMARY DIAGNOSIS:  End-stage degenerative joint disease of the right hip.   PROCEDURE:  Right total hip arthroplasty.   HISTORY OF PRESENT ILLNESS:  The patient is a 77 year old man with end-stage  arthritis of the right hip with history of prior surgery including placement  of Knoll's pins and Smith-Peterson nail for prior injury to the hip  including an above midshaft femur fracture that was fairly well aligned.  The patient has failed conservative management and desires elective total  hip arthroplasty after discussing risks versus benefits.   ALLERGIES:  No known drug allergies. MORPHINE does make him somewhat uneasy.   MEDICATIONS ON ADMISSION:  1. Celebrex 200 mg b.i.d.  2. Prednisone 5 mg daily.  3. Lipitor 80 mg daily.  4. Folic acid 1 mg daily.  5. Altace 2.5 mg daily.  6. Toprol XL 50 mg daily.  7. Flomax one p.o. daily.  8. Zetia 10 mg daily.  9. Aspirin 81 mg daily stopping three days prior to the surgery.   PAST SURGICAL HISTORY:  Epidural abscess drained in 2003.  Esophageal hernia  repair in 2003.  Coronary angioplasty with stent in 2001.  No difficulties  with DET.   PAST MEDICAL HISTORY:  Usual childhood diseases.  Adult history includes  CAD, MI, hypertension, rheumatoid arthritis, and abdominal aneurysm.   SOCIAL HISTORY:  No tobacco, no ethanol, no IV drugs.  He lives with his  wife.  He is a retired Music therapist.   FAMILY HISTORY:  Notable for mother dying at age 55 secondary to  appendicitis.  Father at 52 from COPD.   REVIEW OF SYSTEMS:  This is  positive for glasses, shortness of breath with  exertion, upper and lower dentures, easy bruising and bleeding.  He denies  chest pain.   PHYSICAL EXAMINATION:  GENERAL APPEARANCE:  The patient is a 5 feet 10 inch,  200 pound male.  VITAL SIGNS:  Temperature 96, pulse 86, respiratory rate 16, blood pressure  122/80.  HEENT:  Head is normocephalic.  Ears:  TMs were clear.  Eyes:  Pupils are  equal, round, reactive to light and accommodation.  Nose patent.  Throat  benign.  NECK:  There is full range of motion.  CHEST:  Clear to auscultation and percussion.  CARDIOVASCULAR:  Regular rate and rhythm.  ABDOMEN:  Soft and nontender.  Bowel sounds 2+.  EXTREMITIES:  Right hip positive pain to internal rotation of 0 degrees and  external rotation of 5 degrees.  Neurovascularly intact.  A well-healed  normal scar from previous surgeries.   X-rays show bone on bone DJD with changes as mentioned above.   LABORATORY DATA:  Preoperative labs including CBC, CMET, chest x-ray, EKG,  PT and PTT were all within normal limits with the exception of EKG showing  evidence of previous inferior infarct.   HOSPITAL COURSE:  The day of admission, the patient was taken to the  operating room at [facility] where he underwent a  removal of hardware from the right hip followed by right total hip  arthroplasty using Dupuy S-Rom components, 54 mm one-hole pinnacle cup, 10  degree polyethylene minor, index posterior and superior to accept a 28 mm  head.  NK +0 head, 22 x 17 x 165 x 42 S-Rom stem and 22 small cone.  Drain  placed was Foley catheter.  The patient was placed on perioperative  antibiotics for the first 48 hours.  He was placed on Coumadin prophylaxis  two weeks postoperatively per pharmacy protocol with target INR of 1.5 to 2.  He was placed on PCA for pain control postoperatively and began physical  therapy the first postoperative day.   On postoperative day #1, the patient  was awake and alert with nausea and  vomiting x1 but resolving by morning.  Vital signs stable.  Hemoglobin 11.3.  Neurovascularly intact in all directions.  PT 15.8, otherwise stable.  Foley  was discontinued.   On postoperative day #2, the patient was awake and alert, no emesis.  He had  walked some 20 feet.  Vital signs were stable.  Wound was clean and dry.  Hemoglobin 9.9.  PT 16.1. Otherwise stable.   On postoperative day #3, the patient was without complaint and stated he was  ready to go.  T-max was 99.9, vital signs stable, hemoglobin 10.3,  hematocrit 29.7.  Dressing with scant dried blood.  Wound was benign.  Moderate edema.  Neurovascularly intact.  He had met physical therapy goals  and was otherwise stable postoperatively, improved medically and ready to go  home after his right total hip arthroplasty.   DISPOSITION:  He was discharged home in the care of his family to return to  clinic in one week's time for follow-up, sooner if he should have any  increase in temperature, any increased pain not well-controlled by pain  medication or drainage from the wound.   DIET:  Regular.   MEDICATIONS:  Tylox and Coumadin and resume home medications.   WOUND CARE:  Change dressing daily.   ACTIVITY:  Weightbearing as tolerated with total hip precautions.     Laural Benes. Jannet Mantis.                     Feliberto Gottron. Turner Daniels, M.D.    Merita Norton  D:  06/02/2002  T:  06/03/2002  Job:  [id number]

## 2010-08-12 NOTE — Op Note (Signed)
[facility] [place]  Patient:    [name], [name] Visit Number: [id number] MRN: [id number]          Service Type: MED Location: 3000 [id number] Attending Physician:  Eliezer Champagne Dictated by:   Stefani Dama, M.D. Proc. Date: 12/15/00 Admit Date:  12/14/2000                             Operative Report  PREOPERATIVE DIAGNOSIS:  Epidural abscess, L1-2 and L5-S1.  POSTOPERATIVE DIAGNOSIS:  Epidural abscess, L1-2 and L5-S1.  PROCEDURE:  Right hemilaminotomies at L2-3 and L5-S1.  SURGEON:  Stefani Dama, M.D.  ANESTHESIA:  General endotracheal.  INDICATION:  The patient is a 77 year old individual who developed increasing back pain over the past few weeks time.  Yesterday he started having some difficulty with urinary retention.  A Foley catheter was placed, and an MRI of the spine was obtained demonstrating the presence of epidural granulation and abscess formation at the L2 level and also at the L5-S1 level.  The patient was advised regarding surgical decompression, and he was started on antibiotic prophylaxis.  DESCRIPTION OF PROCEDURE:  The patient was brought to the operating room supine on the stretcher.  After the smooth induction of general endotracheal anesthesia, he was turned prone, the back was shaved and prepped with Duraprep and draped in a sterile fashion.  A midline incision was created in the lower lumbar spine at the L5-S1 region.  The paraspinous musculature was taken down on the left side in a subperiosteal fashion.  A self-retaining retractor was placed in the wound.  A laminotomy was then created by removing the yellow ligament and the interspinous ligament in this region.  The outer layer of the dura was identified.  There was noted to be significant phlegmonomous mass in this region.  This was biopsied and sent for specimen.  Further dissection in this area revealed only phlegmonomous fatty tissue, which was sent as part of the  specimen, but no free pus was identified.  A ball-tipped wire dissector was passed cephalad and inferiorly to see if any pocket of pus could be broken into.  None were found by this maneuver.  Then a small red Robinson catheter was passed in the sublaminar space to irrigate any loose tissue or possible pus.  After this was accomplished at the L5-S1 level, L2 was IDENTIFIED and a laminotomy was created of the L2-3 interspace.  On the right side here as the dissection was carried cephalad, an abscess was ruptured and pus flowed freely from this area.  Dissection of this area was undertaken in a similar fashion, passing a ball-tipped wire probe and then passing a red Robinson catheter to allow irrigation of that space.  Culture swabs were taken from this region, as were specimens from grummous material removed from this region.  In the end, the sac was well-decompressed, no spinal fluid leaks were incurred.  The wounds were closed with #1 Vicryl in interrupted fashion in the lumbodorsal fascia, 2-0 Vicryl was used in the subcutaneous tissue, with 3-0 Vicryl in the subcuticular.  Dry sterile dressings were placed on the skin.  The patient tolerated the procedure well and returned to the recovery room in stable condition. Dictated by:   Stefani Dama, M.D. Attending Physician:  Eliezer Champagne DD:  12/15/00 TD:  12/15/00 Job: [id number]

## 2010-08-12 NOTE — Discharge Summary (Signed)
NAMELOVEL, SUAZO               ACCOUNT NO.:  [id number]   MEDICAL RECORD NO.:  [id number]          PATIENT TYPE:  INP   LOCATION:  5036                         FACILITY:  [facility]   PHYSICIAN:  Feliberto Gottron. Turner Daniels, M.D.   DATE OF BIRTH:  [birth year]   DATE OF ADMISSION:  06/06/2004  DATE OF DISCHARGE:  06/08/2004                                 DISCHARGE SUMMARY   PRIMARY DIAGNOSES:  End-stage degenerative joint disease of the left hip.   PROCEDURE:  Left total hip arthroplasty.   DISCHARGE SUMMARY:  Patient is a 77 year old man who underwent removal of  hardware and right total hip by Dr. Turner Daniels some four years prior to this  admission.  He has done well postoperatively and now has end-stage arthritis  of the left hip and has failed conservative treatment including NSAIDs, PT,  and rest.  He wishes to proceed with a left total hip arthroplasty after  discussing the risks versus the benefits.   ALLERGIES:  He has no known drug allergies.   MEDICATIONS ON ADMISSION:  Altace, Toprol, Protonix, prednisone, Motrin,  Flomax, aspirin, Lipitor, Zetia, and multivitamins.   PAST MEDICAL HISTORY:  Usual childhood diseases.  Adult history:  MI year  2000, rheumatoid arthritis, epidural abscess, coronary artery disease,  aortic aneurysm, hyperlipidemia, hematuria, BPH, cellulitis in the right  hand.   PAST SURGICAL HISTORY:  1.  I&D of the right hand.  2.  I&D epidural abscess.  3.  Stent.  4.  Right total hip arthroplasty.   FAMILY HISTORY:  Mother died at age 23 secondary to complications of  appendicitis.  Father died at 18 complications from COPD.   SOCIAL HISTORY:  No tobacco.  No ethanol.  No heavy drug use.  He is retired  and lives with his wife who is disabled.   REVIEW OF SYSTEMS:  Positive for glasses, decreased hearing, upper and lower  dentures, shortness of breath, but denies any recent illness or chest pain.   PHYSICAL EXAMINATION:  VITAL SIGNS:  Temperature 98.1, pulse  70, blood  pressure 122/82.  GENERAL:  Patient is a 5 feet 10 inch male.  HEENT:  Head is normocephalic, atraumatic.  Ears:  TMs clear.  Eyes:  Pupils  are equal, round, and reactive to light and accommodation.  Nose patent.  Throat benign.  NECK:  Supple.  Full range of motion.  CHEST:  Clear to auscultation and percussion.  HEART:  Regular rate and rhythm.  ABDOMEN:  Soft, nontender.  EXTREMITIES:  Right hip range of motion 0 to 90 degrees, internal/external  rotation up to 30 degrees without pain.  Left hip 5 degrees of internal  rotation, 10 degrees of external rotation.  Can flex only to 90 degrees,  again with pain in all directions.  Neurovascularly intact.  Positive foot  tap.  SKIN:  Well healed normal scar right hip, otherwise unremarkable.   X-rays show end-stage DJD of the left hip with sclerotic changes, right hip  well placed, well fixed components.  Preoperative laboratories including  CBC, CMET, chest x-ray, EKG, PT, and PTT were all  within normal limits with  the exception of the chest x-ray which did show some chronic obstructive  pulmonary disease, no acute abnormality noted.  Did have potassium of 5.2,  otherwise within normal limits.   HOSPITAL COURSE:  On the day of admission patient was taken to the operating  room at [facility], where he underwent a left total hip arthroplasty  using DePuy SROM components; a 58 mm ASR shell, 51 mm NK+0 head and a 22 x  17 x 42 SROM stem and a 22 D small cone.  Patient was placed on  perioperative antibiotics.  He was placed on postoperative Coumadin  prophylaxis per pharmacy protocol.  He was begun with physical therapy the  first postoperative day.  He was placed on PCA Dilaudid for pain control.  First postoperative day the patient was awake and alert, moderate pain.  Vital signs were stable.  He was neurovascularly intact.  Hemoglobin 12.1,  PT 15.1.  X-rays showed well placed, well fixed total hip bilaterally.  Foley  was continued x24 hours and he began weightbearing as tolerated PT  with total hip precaution.  He was evaluated by discharge planning and  [facility] was contacted to plan his post discharge needs.  Postoperative day two patient was without complaint.  No nausea or vomiting.  T-max was 99.2, INR 1.5, hemoglobin 12.2.  Dressing was dry.  Wound was  benign.  He continued physical therapy.  Foley and PCA were discontinued.  Postoperative day two patient had met his PT goals, was transferring  independently without assistance, was in moderate pain well-controlled by  oral pain medications and was medically stable.  He was discharged home to  the care of his family.  He will have home health RN, home health PT with  [facility] to continue his Coumadin prophylaxis and physical  therapy.   ACTIVITY:  weightbearing as tolerated with total hip precautions.   DIET:  Regular.   MEDICATIONS:  1.  Percocet as needed for pain.  2.  Coumadin per pharmacy protocol.  3.  Robaxin.  4.  Resume all home medications with exception of the aspirin.   Return to clinic approximately one week's time, calling for appointment,  sooner if he should have any increasing temperature, pain, or drainage from  the wound.  He may shower with a seated shower postoperative day #8.  Dressing changes daily or as needed.  Diet is regular.      JBR/MEDQ  D:  08/01/2004  T:  08/01/2004  Job:  [id number]

## 2010-08-12 NOTE — Op Note (Signed)
NAMECORTEZ, FLIPPEN                         ACCOUNT NO.:  [id number]   MEDICAL RECORD NO.:  [id number]                   PATIENT TYPE:  INP   LOCATION:  0376                                 FACILITY:  [facility]   PHYSICIAN:  Valetta Fuller, M.D.               DATE OF BIRTH:  Jan 03, 1931   DATE OF PROCEDURE:  12/10/2001  DATE OF DISCHARGE:  12/13/2001                                 OPERATIVE REPORT   PREOPERATIVE DIAGNOSES:  1. Gross hematuria.  2. Positive urine cytology.   POSTOPERATIVE DIAGNOSES:  1. Gross hematuria.  2. Positive urine cytology.   PROCEDURE PERFORMED:  1. Cystoscopy.  2. Bladder barbotage.  3. Retrograde pyelogram.  4. Rigid ureteroscopy.  5. Flexible ureteroscopy.  6. Upper tracts washings and brush biopsy.   SURGEON:  Valetta Fuller, M.D.   ASSISTANT:  Crecencio Mc, M.D.   ANESTHESIA:  General.   INDICATIONS:  Mr. Borba is a 77 year old male.  He has had a complex urologic  history.  When we initially saw him, he was sent over because of some  asymptomatic gross hematuria.  An abdominopelvic CT was done with contrast  this spring.  That showed no evidence of obvious upper tract abnormalities.  Within his bladder, we saw some generalized oozing from his prostatic fossa  but saw no other pathology at that time.  The patient also had cytology  which showed some very mild atypia but nothing particularly worrisome.  The  patient then really refused additional evaluation.  He continued to have  some intermittent gross hematuria, and we repeated a cystoscopy in the  office where we saw obvious blood from the right ureteral orifice and felt  that we had identified the probable location of the hematuria and had  localized it to the right upper tract.  The patient agreed at that time to  have a retrograde pyelogram and ureteroscopy.  A careful evaluation revealed  no obvious abnormalities, and several upper tract biopsies and washings  showed some continued  atypia but no evidence of frank malignancy.  The  patient has continued to have persistent gross hematuria.  He has been  somewhat hesitant to return to the office, but his wife has been persistent,  as have we, in trying to get him to have further evaluation or at least  careful follow-up.  When the patient was back in the office, we sent a  special FISH cytology which was positive.  While that test is normally used  to follow patients who have known urologic/urothelial malignancies, we felt  that in this situation, it was prudent to at least investigate what this  study might show.  In the interim, the patient was seen by Dr. Abbey Chatters of  general surgery, and he was noted to have a significant diaphragmatic hernia  which need a laparoscopic repair.  Since approximately 4-6 months had passed  since his last evaluation,  we felt it prudent to reevaluate his upper tracts  and bladder to be sure that there was no other pathology that had now  developed that we could identify and potentially treat.   TECHNIQUE AND FINDINGS:  The patient had undergone a successful laparoscopic  procedure by Dr. Abbey Chatters.  He was stable, and it was felt reasonable to  proceed with our evaluation.  He was already fully induced with general  anesthesia.  He was placed in the supine position and lithotomy position and  prepped and draped in the usual manner.  Cystoscopy again revealed some mild  trilobar hyperplasia but no active bleeding from the prostate or bladder.  There were no obvious bladder lesions appreciated.  One could clearly see  bloody effluent coming from the right ureteral orifice.  A retrograde  pyelogram was quite normal and showed no evidence of filling defects,  strictured areas, or any other abnormality.  A guidewire was placed in the  renal pelvis under fluoroscopic guidance.  We then elected to perform rigid  ureteroscopy and carefully inspected the distal ureter and proximal ureter.  No  pathology was appreciated.  We then removed the rigid ureteroscope and  placed an access sheath.  We then placed a flexible ureteroscope within the  renal pelvis.  With the retrograde pyelogram on the fluoroscopy screen, we  were able to mark each of the individual calices as we carefully inspected  the entire urothelium.  This took a total of at least an hour of endoscopic  time to fully investigate the situation.  While the urine in the renal  pelvis appeared to be bloody, we could not see an active source of bleeding.  We carefully marked each calix that we saw.  In three of the middle pole  calices, there was a slight bulging out of the renal papilla but no evidence  of obvious papillary sloughing nor was there evidence of obvious  transitional cell carcinoma.  We did a generalized barbotage washing of the  renal pelvis and also a washing separately of one of the calices that showed  this slight abnormality.  We attempted to do a cold cup biopsy, but the  angulation of this calix did not allow for a cold cup biopsy to be  performed.  We did, however, do a brush biopsy of this area.  In conclusion,  we could find no obvious cause for his hematuria, although this is the  second time it had lateralized to the right ureteral orifice.  The only  abnormality appreciated was a slight bulging of the renal papilla, but no  obvious transitional cell carcinoma was appreciated.  We will need to await  the cytology results.  Because of the need for the access sheath and because  of the manipulation up and down the ureter, we felt it prudent to leave a  double-J stent.  For that reason, a 7 French 24 cm double-J stent with a  dangle string was utilized and placed under fluoroscopic guidance.  Overall,  approximately two hours was used on this procedure.                                               Valetta Fuller, M.D.   DSG/MEDQ  D:  12/27/2001  T:  12/28/2001  Job:  [id number]   cc:   Demetria Pore.  Coral Spikes, M.D.  [address]  [address]  [place]  [place] [address]  Fax: [phone]   Adolph Pollack, M.D.  Fax: [phone]

## 2010-08-12 NOTE — H&P (Signed)
[place]. [facility]  Patient:    [name], [name]                        MRN: [id number] Adm. Date:  06/08/99 Attending:  Demetria Pore. Coral Spikes, M.D. CC:         Francisca December, M.D.                         History and Physical  CHIEF COMPLAINT:  The patient is a 77 year old, right-handed white male with the chief complaint of "hurting in my chest," admitted with substernal chest pain and bilateral elbow pain with shortness of breath, ? etiology, initially to rule out cardiac (angina or MI), ? other (GI, gallbladder, ?).  PROBLEM:  Chest pain, bilateral elbow, and shortness of breath, ? etiology, rule out cardiac, ? other.  One week ago substernal chest pain, but does not remember what he was doing, lasting one day and resolved.  He did not tell anyone, and no associated symptoms, and no treatment.  On June 06, 1999, working in the garden at about 11 a.m., onset of a "solid hurt", a 6/10 substernal, and bilateral elbow pain and shortness of breath.  No sweating, nausea, vomiting, or dizziness.  Took no medication.  Was constant through the ay on June 06, 1999.  On June 07, 1999, the patient woke up and the pain was gone. He went to work and was tearing down a wall, with the onset again of 6/10 "solid hurt", substernal chest and bilateral elbow pain, and shortness of breath.  No sweating, nausea, vomiting, or dizziness.  The patient went home, still with the above symptoms, and had indigestion that evening.  The pain got worse 8/10. Took no medication.  Had trouble sleeping (because of the pain).  On June 08, 1999, the patient still had pain substernally and bilateral elbow pain.  He told his wife last night about the pain, and his wife called this morning, and the patient was directed to the office to be seen.  The wife drove the patient.  In the office the blood pressure was 132/88.  He stated he had a 6/10 substernal pain and bilateral elbow  pain, but was not short of breath.  He was not in acute distress.  The patient was given one sublingual nitroglycerin and the pain about 10 minutes later went from 6/10 to 2/10, and about five to 10 minutes later, it was gone.  I elected to admit for further evaluation.  The patient was not having chest pain at the time of admission to the hospital.  He went to the hospital by EMS.  PAST MEDICAL HISTORY:  1. The patient has a history of a gallstone.  Evaluation of chest x-ray, a     gallstone noted.  No abdominal pain, nausea, or vomiting.  2. He had GERD, on Prilosec 20 mg q.d., status post dysphagia in 1996, with     endoscopy, dilatation.  3. He stopped smoking in 1987.  A chest x-ray with scarring.  A chest x-ray in     1995, prior to starting methotrexate, new right upper lobe density compared     to May 10, 1987, and subsequent CT scan of the chest with scarring of     the right upper lobe.  Follow-up chest x-ray, stable scarring of right upper     lobe.  Chest x-ray in February 1999, ? benign  density on chest x-ray with     subsequent CT scan, moderate pulmonary emphysema, with bullous disease.     Scarring of left upper lobe.  Two tiny, less than 5.0 mm, indeterminate     nodular opacities.  Chest x-ray on May 24, 1998, chronic bronchitis,     hiatal hernia, and moderate cardiomegaly.  Questionable density on previous     lateral study, cleared in the substernal area.  4. He has hyperlipidemia, on Lipitor 10 mg q.d. (LDL was 226 this past year,     and started on Lipitor with subsequently LDL of 132).  At that time risk factor     of age.  5. The patient has some chronic intermittent low back pain, ? mechanical, and as     been on Celebrex 200 mg q.d. over the past two to three months.  The patient     has had low back pain in 2000.  Serum immunoelectrophoresis unremarkable. BC     normal.  Protein electrophoresis:  Hypogammaglobulinemia, sedimentation     rate  of 6.  Lumbosacral spine:  Degenerative change with moderate thoracolumbar     scoliosis.  Bone scan:  In July 2000, mild degenerative change.  No acute     abnormality.  Healing fractures of left anterior rib.  (The patient stated     several months before that a joist felt against him.)  Had been taking     Tylenol for pain.  Over the last two months has taken Celebrex 200 mg q.d. Marland Kitchenc.     Back pain is intermittent, without radiation down the legs.  6. The patient had a brother with heart disease who died at age 98.  73. Seropositive nodular erosive rheumatoid arthritis.  Not having any joint     pain or swelling now.  Has nodules.  On methotrexate 20 mg a week, takes     on Friday, prednisone 5 mg q.d., folic acid 1 mg q.d., Centrum Silver once     q.d., calcium 600 mg with D once q.d.  8. Positive stool for blood in  1992.  Positive stool for blood with flexible     sigmoidoscopy and barium enema in 1992, unremarkable except for scattered     diverticula on barium enema.  In 1995, positive stool for blood.  Declined     further workup.  In August 1996, agreed to a colonoscopy, Dr. Genene Churn.     Sherin Quarry, left-sided diverticula.  9. Decreased hearing. 10. Moles, followed by Dr. Jorja Loa. 11. Hematuria in January 1999, 20-30 red cells per high power field.  The     patient saw Dr. Barron Alvine.  IVP negative.  Cystoscopy negative. Cytology     done, but I do not have the results.  The patient told laboratory okay.     Urinalysis on May 24, 1998, unremarkable. 12. Bifocal glasses. 13. Status post otitis externa. 14. Lipoma of the arms. 15. PVC on electrocardiogram in 1989. 16. Status post low back pain in 1998, with sed rate, PEP, urinalysis, and     CBC normal. 17. Status post left inner thigh and knee pain, ? soft tissue infection     responding to Keflex in 1998.  ALLERGIES:  No known drug allergies.  IMMUNIZATIONS:  Flu shot 2000, Pneumovax shot in 1998, tetanus shot in  1994.  PAST SURGICAL HISTORY: 1. Status post moles in 1992, Dr. Ria Bush. [place]. 2. Status post teeth pulled.  Has dentures.  INJURIES: 1. Status post traumatic right olecranon bursitis, resolved, 1993.  2. Status post right hip fracture in 1971, secondary to a fall.  X-ray of    right hip in 1989, fixation device okay. 3. Status post fracture of pelvis and crushed leg in 1958, secondary to injury. 4. Status post torn muscle thighs and shoulders, dislocation in 1970, secondary    to injury. 5. Status post soft tissue infection, right inner leg, scraping it against    the lawnmower, resolved. 6. Status post ingrown left nail, resolved in 2000. 7. Status post trauma left third finger, with secondary infection.  X-ray    revealed no foreign material.  See by Dr. Nicki Reaper, Keflex, resolved.  FAMILY HISTORY/SOCIAL HISTORY:  Married, with seven children.  Stopped smoking n 1987.  Stopped drinking alcohol in 1987.  Works in [employer].  Status post dairy farmer.  Family history of rheumatoid arthritis, diabetes mellitus, heart disease in a brother who died at age 29.  REVIEW OF SYSTEMS:  The patient had bone densitometry on May 30, 1999, T-score of lumbar spine +1.7, of hip -0.9.  All other systems negative.  PHYSICAL EXAMINATION:  GENERAL:  Alert, in no acute distress.  VITAL SIGNS:  Initial blood pressure 132/88, after sublingual nitroglycerin blood pressure 114/82, heart rate 88, temperature 96.9 degrees.  SKIN:  No rash, not sweaty, moles.  HEENT:  Eyes:  Conjunctivae pink.  Sclerae white.  Pupils equal, round, reactive to light.  Fundi grossly normal.  Otoscopic normal.  Nasal mucosa normal.  Mouth and throat:  Dentures.  NECK:  Supple.  Thyroid not enlarged.  No bruit, no adenopathy.  LUNGS:  Clear to percussion and auscultation.  No CVA tenderness.  HEART:  Regular rhythm, without murmur, gallop, or rub.  No chest wall tenderness.  ABDOMEN:  Benign,  without organomegaly or bruit.  GENITOURINARY:  Normal male genitalia.  RECTAL:  Prostate 2+ enlarged, nontender.  Stool brown, negative for blood.  EXTREMITIES:  No edema, pulses intact.  NEUROLOGIC:  No gross focal neurological finding.  PERIPHERAL JOINTS:  Nodules right and left thumb and olecranon area, ? synovitis minimal, left third PIP joint.  No synovitis in other joints.  DATA:  Electrocardiogram:  Sinus rhythm, PVC, nonspecific ST-T change.  IMPRESSION:  Substernal chest hurting, bilateral elbow pain, and shortness of breath, ? etiology.  Rule out cardiac (angina or myocardial infarction).  Rule ut gastrointestinal or gallbladder, ? other.  PLAN: 1. Admit and discuss with the patient and his wife. 2. Cardiology consultation. 3. IV heparin, IV nitroglycerin, aspirin.  (Aspirin was given also in the office.) 4. Serial enzymes and electrocardiograms, amylase, and lipase. 5. Rheumatoid arthritis:  Hold methotrexate.  Continue prednisone. 6. Chronic low back pain:  Hold Celebrex.  Use Tylenol for pain. 7. See order sheet for details.  Will continue Lipitor 10 mg q.d. for    hyperlipidemia and Prilosec 20 mg q.d. for gastroesophageal reflux disease.DD:  06/08/99 TD:  06/08/99 Job: [id number]

## 2010-08-12 NOTE — Cardiovascular Report (Signed)
[place]. [facility]  Patient:    [name], [name]                      MRN: [id number] Proc. Date: 06/08/99 Adm. Date:  [id number] Attending:  Genene Churn CC:         Demetria Pore. Coral Spikes, M.D.             Marlene Lard, M.D.                        Cardiac Catheterization  INDICATIONS:  Acute myocardial infarction, unrecognized on EKG with continued pain.  PROCEDURE PERFORMED: 1. Left heart catheterization. 2. Selective coronary angiography. 3. Left ventriculography. 4. Extract thrombectomy of right coronary artery. 5. Stent of right coronary artery.  CINE NO. [id number]  DESCRIPTION OF PROCEDURE:  After informed consent, a 6-French sheath was started in the right femoral artery using the modified Seldinger technique.  The 6-French 2 multipurpose catheter was used for a left ventriculography and right coronary angiography.  Hemodynamic recordings were also made with this catheter.  A #4 left Judkins catheter was used for left coronary angiography.  The patient tolerated the diagnostic procedure without complications.  After reviewing the cineangiograms in the room, it was obvious the patient was having an acute inferior myocardial infarction.  He had well-formed collaterals to the distal right coronary.  There was a large thrombus in the area of total occlusion of the right coronary.  We upgraded to an 8-French arterial sheath. e placed a 6-French right venous sheath.  IV Integrilin double bolus was started followed by an infusion.  An additional 3000 units of IV heparin were given. We used an 8-French JL4 guide catheter and a 300-cm long luge wire.  The patient was entered into the X-Tract thrombectomy study in the acute MI registry.  The patient had consented to this trial prior to coming to the laboratory.  After distally placing the wire, we performed X-Tract thrombectomy using both the 1.5 and the 2.0-mm diameter device.  We made three  runs with the 1.5 device and one run with the 2.0-mm device.  The 2.0-mm device would not travel as easily down the artery but did penetrate the lesion successfully.  Intracoronary nitroglycerin, 200 mcg, was given and post pictures were made.  Following the thrombectomy runs, there as brisk antegrade flow without evidence of distal embolization.  We then removed he X-Tract catheter and deployed a 13-mm long 4.0-mm Tetra stent to 14 atmospheres.  A second balloon inflation to 10 atmospheres was performed.  The case was then terminated with an excellent angiographic result with the patient stable and no  apparent complications noted.  The ACT post procedure was 253 seconds.  RESULTS:   I. Hemodynamic data      A. Aortic pressure 106/69.      B. Left ventricular pressure 104/11.  II. Left ventriculography:  There was mild to moderate inferobasal hypokinesis.      There was also mild anterior wall hypokinesis.  No mitral regurgitation was      noted.  The ejection fraction was at least 50%. III. Selective coronary angiography      A. Left main coronary:  Normal.      B. Left anterior descending coronary:  This was a large vessel that wraps         around the left ventricular apex.  It gives origin to one dominant diagonal  branch.  A septal perforator also comes off in the same region of the LAD.         The LAD distal to the septal perforator is 30-50% narrowed.  The LAD wraps         the apex and via septal perforators and around the apex gives collaterals         to the acute marginal branch of the right coronary and the distal right         coronary.  No significant obstruction is noted in the LAD.      C. The circumflex artery is a large vessel that contains ectasia in the         proximal and mid segment.  Two moderate sized obtuse marginal branches         arise distally.  The second obtuse marginal branch is relatively small nd         contains 50% narrowing  proximally.  The second obtuse marginal branch         bifurcates on the inferolateral wall.      D. The right coronary artery is totally occluded in the proximal/mid vessel         with evidence of large thrombus burden.  As mentioned before, the distal         right coronary fills late by left-to-right collaterals.     IV. Percutaneous coronary intervention:  Following X-Tract thrombectomy,         antegrade flow as reestablished.  Antegrade flow was felt to be TIMI-3. No         evidence of distal embolization was noted.  We then successfully stented         the culprit lesion from 85% appearance after thrombectomy to 0% following         stenting.  Brisk antegrade flow was noted and still felt to be grade 3 IMI         classification.  CONCLUSIONS: 1. Acute inferior infarction due to total occlusion of the proximal/mid right    coronary artery. 2. Successful X-Tract thrombectomy and direct stenting with excellent angiographic    result post procedure. 3. Mild to moderate left coronary disease. 4. Mildly impaired left ventricular function.  Preserved EF of 50%.  RECOMMENDATION:  Aspirin, Plavix, Integrilin for 18 hours.  Add beta blocker. Watch blood pressure and heart rate closely. DD:  06/08/99 TD:  06/08/99 Job: [id number]

## 2010-08-12 NOTE — Op Note (Signed)
[name], [name]                         ACCOUNT NO.:  [id number]   MEDICAL RECORD NO.:  [id number]                   PATIENT TYPE:  INP   LOCATION:  X001                                 [facility]ACILITY[facility]  [facility]   PHYSICIAN:  [name], M.D.            DATE OF BIRTH:  11-12-1930   DATE OF PROCEDURE:  12/10/2001  DATE OF DISCHARGE:  12/10/2001                                 OPERATIVE REPORT   PREOPERATIVE DIAGNOSES:  1. Giant paraesophageal hernia.  2. Gastroesophageal reflux disease.   POSTOPERATIVE DIAGNOSES:  1. Gian paraesophageal hernia.  2. Gastroesophageal reflux disease.   PROCEDURE:  Laparoscopic repair of paraesophageal hernia and Nissen  fundoplication.   SURGEON:  [name], M.D.   ASSISTANTRiley Lam A. Magnus [name], M.D.   ANESTHESIA:  General.   INDICATIONS FOR PROCEDURE:  The patient is a 77 year old male with  progressive dysphagia and known gastroesophageal reflux disease.  He has had  a previous esophageal dilatation by [name] Catchings, MD.  Upper GI study was  performed which demonstrated a large paraesophageal hernia with at least two-  thirds of the stomach up in the chest.  His reflux is improved with proton  pump inhibitors.  He now presents for elective repair because of his  progressive dysphagia.   TECHNIQUE:  He is placed supine on the operating table while general  anesthetic was administered.  A Foley catheter was placed in his bladder and  his abdomen was sterilely prepped and draped.  A small epigastric incision  was made incising the skin and subcutaneous tissue down to the level of the  fascia.  A small 1-cm incision was made in the fascia and a pursestring  suture of 0 Vicryl was placed around the fascial edges.  The peritoneal  cavity was then entered bluntly and under direct vision.  A Hasson trochar  was introduced into the peritoneal cavity and a pneumoperitoneum was created  by insufflation of CO2 gas.  Next, a 30-degree  laparoscope was introduced.  Under direct vision, a 5-mm trocar was placed in the right mid lateral  abdomen through which the liver retractor was placed.  I retracted the left  lobe of the liver anteriorly and to the right exposing a rather large  hiatus.  At this point, it appeared that the entire stomach was up in his  chest.  Next, under direct vision a 5-mm trocar was placed in the right  upper quadrant and an 11-mm trocar was placed in the right upper quadrant.  A 5-mm trocar was then placed in the left upper quadrant.  The stomach was  grasped and the hernia was partially able to be reduced.  I could identify  the right crus and divided a thin gastrohepatic ligament.  I mobilized the  right crus and noticed a very large portion of sac from the right crus to  part of the hernia.  Using a  Harmonic scalpel and identifying stomach,  esophagus, and right crus, I carefully dissected the sac free from the right  crus and removed it.  There was more sac posteriorly as well as to the left.  There was also large sac anteriorly and I was able to dissect this free up  into the mediastinum and remove it.  The sac was essentially removed  piecemeal with the 360-degree dissection, always identifying the stomach and  the esophagus and dividing the sac.  Initially, I had rebound affect in the  stomach posteriorly but when I had removed a lot of the posterior sac and  the left lateral sac, the stomach stayed back in the abdominal cavity.  I  was able to finally remove all of the sac and noticed I had a slightly  shortened esophagus and thus I mobilized the esophagus up into the mid  portion of the mediastinum.  Reducing the hernia and excising the sac took  approximately two hours which is normally what it takes to perform a routine  laparoscopic hiatal hernia repair and Nissen fundoplication.  Very tedious  but no obvious damage was done to the stomach or the esophagus.   Next, I began dividing  short gastric vessels at the level of the fundus all  the way up toward the left crura.  I noticed still some residual sac on the  left crus on the posterolateral aspect and I resected this.  I then felt I  had 3 cm of intraabdominal esophagus.  I had the left crus and the right  crus well visualized.  I placed a Penrose drain around the esophagus after  creating a retroesophageal window.  I retracted the esophagus anteriorly  exposing the hiatal defect.  I then repaired the crural defect using seven  interrupted 0 nonabsorbable sutures.  The sutures were placed very close  together and the repair did not appear to be under tension.  Following this,  a size 50 __________ bougie was easily placed into the esophagus and  stomach.  I saw no esophageal defects and as I watched the bougie being  passed down the distal esophagus into the stomach under laparoscopic vision.  I then passed part of the stomach behind the esophagus and performed a 360-  degree 2-cm fundoplication using three interrupted 0 nonabsorbable sutures.  The bites incorporated the left leaf of the wrap, the esophagus, and the  right leaf of the wrap with the first two sutures and then just the left and  right leaves of the wrap with a third suture.  I removed the dilator and  noticed that the wrap was floppy, under no tension.   Next, I inspected the area and no bleeding was noted.  The spleen was intact  as was the liver.  I subsequently removed the liver retractor.  I then  removed all the trocars under direct vision and released the  pneumoperitoneum.  The epigastric fascial defect was closed by tightening up  and tying down the pursestring suture.  Skin incisions were closed with 4-0  Monocryl subcuticular stitches.  Steri-Strips and sterile dressings were  applied.  He tolerated this portion of the procedure well and [name] was going to proceed with the cystoscopy.  [name], M.D.    [name]  D:  12/10/2001  T:  12/10/2001  Job:  [id number]   cc:   [name], M.D.  [address]  [place]  [place] [address]  Fax: [phone]   [name] Maw, MD  [address]  [place]  [place] [address]  Fax: [phone]   [name] Pore. Levitin, MD  [address]  [address]  [place]  [place] [address]  Fax: [phone]

## 2010-08-12 NOTE — Discharge Summary (Signed)
NAMEEREZ, MCCALLUM                         ACCOUNT NO.:  [id number]   MEDICAL RECORD NO.:  [id number]                   PATIENT TYPE:  INP   LOCATION:  5040                                 FACILITY:  [facility]   PHYSICIAN:  Thora Lance, M.D.               DATE OF BIRTH:  14-Jun-1930   DATE OF ADMISSION:  10/12/2003  DATE OF DISCHARGE:  10/15/2003                                 DISCHARGE SUMMARY   REASON FOR ADMISSION:  This is a 77 year old white male who had injured his  right hand two weeks ago with a screw.  The hand had started hurting two  days prior to admission.  It had become swollen, red and painful.  He denied  fever.   SIGNIFICANT FINDINGS:  VITAL SIGNS:  Temperature 97.0, blood pressure  122/79, respirations 20, oxygen saturations 96% on room air.  HEENT:  Normal.  LUNGS:  A few crackles right base.  HEART:  Regular rate and rhythm without murmur.  EXTREMITIES:  Right hand showed erythema and swelling of the dorsum with  tenderness.   LABORATORY DATA:  CBC:  WBC 9.6, hemoglobin 13.3.  Chemistries:  Sodium 142,  potassium 4.0, chloride 113, bicarbonate 26.  Glucose 103.  BUN 16.  Creatinine 1.0.   HOSPITAL COURSE:  The patient was admitted with right hand cellulitis  secondary to an abrasion.  The swelling significantly worsened in the first  24 hours.  The patient was felt to likely be developing an abscess.  Dr.  Dairl Ponder was called, and the patient underwent an I&D of the right  hand abscess on the evening of July 18.  The patient was treated with  Unasyn.  The patient had a repeat I&D with secondary wound closure on the  right hand and wrist on October 14, 2003, by Dr. Mina Marble.  He was seen by Dr.  Maurice March of the ID service.  The cultures grew out Staphylococcus aureus which  was methicillin sensitive.  The patient was changed from Unasyn to Keflex  500 mg q.i.d. for a total of two weeks as recommended by Dr. Maurice March.   DISCHARGE DIAGNOSES:  1.  Right hand  abscess/cellulitis.  2.  Rheumatoid arthritis.  3.  Hypertension.  4.  Coronary artery disease.  5.  Benign prostatic hypertrophy.  6.  Hyperlipidemia.   PROCEDURES:  I&D of right hand abscess x2, Dr. Mina Marble.   DISCHARGE MEDICATIONS:  1.  Protonix 40 mg p.o. daily.  2.  Altace 2.5 mg p.o. daily.  3.  Lipitor 80 mg p.o. daily.  4.  Zetia 10 mg p.o. daily.  5.  Toprol XL.  6.  Motrin 800 mg b.i.d.  7.  Prednisone 5 mg a day.  8.  Flomax 0.4 mg a day.  9.  Aspirin daily.  10. Multivitamin.  11. Folic acid, 1 mg a day.  12. Keflex 500 mg, four times a day for  two weeks.  13. Vicodin 5/500, one or two q.6h. p.r.n. pain.   DISPOSITION:  1.  Discharge to home.  2.  Diet was low sodium diet.   FOLLOWUP:  1.  Dr. Coral Spikes, November 02, 2003.  2.  Dr. Dairl Ponder, Tuesday, July 26.       JJG/MEDQ  D:  12/12/2003  T:  12/14/2003  Job:  [id number]   cc:   Artist Pais Mina Marble, M.D.  [address]  [place]  [place] [address]  Fax: [phone]

## 2010-08-12 NOTE — Op Note (Signed)
[name], [name]                         ACCOUNT NO.:  [id number]   MEDICAL RECORD NO.:  [id number]                   PATIENT TYPE:  INP   LOCATION:  NA                                   FACILITY:  [facility]   PHYSICIAN:  Feliberto Gottron. Turner Daniels, M.D.                DATE OF BIRTH:  1930/05/11   DATE OF PROCEDURE:  04/14/2002  DATE OF DISCHARGE:                                 OPERATIVE REPORT   PREOPERATIVE DIAGNOSES:  End-stage arthritis of the right hip, with retained  Knowles pins and Smith-Petersen nail and side plate above an old mid-shaft  femur fracture.   POSTOPERATIVE DIAGNOSES:  End-stage arthritis of the right hip, with  retained Knowles pins and Smith-Petersen nail and side plate above an old  mid-shaft femur fracture.   PROCEDURE:  Removal of hardware from right hip, followed by a right total  hip arthroplasty using DePuy SROM components, a 54 mm one-hole Pinnacle cup,  a 10-degree polyethylene liner index posterior and superior to accept a 28  mm head and NK+0, a 22 x 17 x 165 x 42 SROM stem, a 22 small cone.   SURGEON:  Feliberto Gottron. Turner Daniels, M.D.   ASSISTANTLaural Benes. Su Hilt, P.A.-C.   ANESTHESIA:  General endotracheal.   ESTIMATED BLOOD LOSS:  400 cubic centimeters.   FLUIDS REPLACED:  1500 cubic centimeters of crystalloid.   URINE OUTPUT:  300 cubic centimeters.   DRAINS:  Foley catheter.   INDICATIONS FOR PROCEDURE:  This 76 year old man who has end-stage arthritis  of his right hip, with a prior surgery, including the placement of a Knowles  pins and a Smith-Petersen nail, for prior problems with the hip, above a mid-  shaft femur fracture that was fairly well-aligned.  He has end-stage  arthritis and has failed conservative treatment.  He desires elective total  hip arthroplasty.  Will use the SROM system because of the stability to  handle abnormal geometry.   DESCRIPTION OF PROCEDURE:  The patient is identified by arm band and was  taken to the operating  room at [facility]. [facility]  The  appropriate anesthetic monitors were attached.  General endotracheal  anesthesia was induced with the patient in the supine position, followed by  placement of a Foley catheter.  He was then rolled into the left lateral  decubitus position and fixated with a Stulberg Mark II pelvic clamp, and the  right lower extremity prepped and draped in the usual sterile fashion from  the ankle to the hemi-pelvis.  The skin along the lateral hip and thigh was  infiltrated with 20 cubic centimeters of 0.5% Marcaine and epinephrine  solution, and a 25 cm incision starting at about 15 cm distal to the greater  trochanter was made, allowing a posterolateral approach to the hip, going up  over the greater trochanter and then posterior and superior for another 10  cm through the skin and subcutaneous tissue.  We ignored the old skin  incision which was about two inches anterior to this, for placement of the  Smith-Petersen nail side plate.  We cut the IP band in line with the skin  incision and exposed the vastus lateralis, greater trochanter, and the  posterior aspect of the hip.  We immediately encountered an exposed Knowles  pin which was removed with a Knowles pin driver.  The second one was exposed  as we took down the vastus lateralis with a longitudinal incision over the  Smith-Petersen nail plate, and then removed the [facility] screws holding the  nail plate in place, and then removed the nail plate itself, using the SLAP  hammer.  This wound was then washed out thoroughly with normal saline  solution.  We directed our attention to the hip itself, having removed all  of the hardware.  A Colver retractor was placed between the gluteus minimus  and the superior hip joint capsule, a second between the quadratus femoris  and the inferior hip joint capsule.  The piriformis and short external  rotators were then tagged with two #2 Ethibond sutures and cut off at  their  insertion on the intertrochanteric crest, exposing the posterior aspect of  the hip joint capsule, which was then developed into an acetabular-based  flap and likewise tagged with two #2 Ethibond sutures.  This exposed the  arthritic femoral head which was then dislocated from the acetabulum with  flexion and internal rotation.  We performed a standard neck cut one thumb  breadth above the lesser trochanter.  The hip was then placed in neutral  rotation, and posterior superior and posterior inferior wing retractors were  placed around the labrum.  It was translated anteriorly with a Hohmann  retractor, leveling off the anterior cone, and a Colver was placed just  below the cotyloid notch, completing the acetabular exposure.  The labrum  was then excised with the electrocautery, fully exposing the acetabulum,  which was then sequentially reamed up to a 53 mm basket reamer, obtaining  good coverage in all quadrants.  We placed a 54 mm DePuy Pinnacle cup in 45  degrees of abduction, 20 degrees of anteversion, followed by a 10-degree  liner, to accept a 28 mm head index posterior and superior, just preceded by  the central occluder screw.  We then flexed and internally rotated the hip,  exposing the proximal femur and sequentially cylindrically up to a 17  cylindrical reamer, followed by the 17.5 to two-thirds of the depth.  The  old femur fracture did not interfere with the reaming.  We then performed a  conical reaming up to a 22-D cone, followed by calcar milling, to accept a  small calcar.  A 22-D trial cone was then hammered into place, followed by a  17 stem with a 42 neck and an NK+0 trial ball in neutral rotation in  relation to the cone, the hip assembled, and excellent stability noted to  flexion of 9, internal rotation of 50, and an in extension he could be  excellently rotated for 30 degrees and could not be dislocated.  At this point the wound was once again washed out with  normal saline solution.  The  trial components were removed.  A 22-D small cone was hammered into place,  followed by a 22 x 17, x 42 x 165 stem in a neutral version in relation to  the cone itself.  A  28 mm NK+0 Cobalt chrome ball was hammered on the stem.  The hip once again was reduced, and excellent stability noted.  The capsule  and short external rotators were repaired back to their intertrochanteric  crests through the drill holes.  The vastus lateralis was repaired with #0  Vicryl suture.  The wound was once again washed with normal saline solution.  No drains were placed because of minimal bleeding at this point.  The IQ  band was closed with running #1 Vicryl suture, the subcutaneous tissue with  #0 and #2-0 undyed suture, and the skin with running interlocking #3-0 nylon  suture.  A dressing of Xeroform, 4 x 4 dressings, sponges, and Hypafix tape  applied. The patient was then rolled supine, awakened, and taken to the  recovery room without difficulty.                                                Feliberto Gottron. Turner Daniels, M.D.    Ovid Curd  D:  04/14/2002  T:  04/14/2002  Job:  [id number]

## 2010-08-12 NOTE — Op Note (Signed)
[name], [name]                         ACCOUNT NO.:  [id number]   MEDICAL RECORD NO.:  [id number]                   PATIENT TYPE:  INP   LOCATION:  5040                                 FACILITY:  [facility]   PHYSICIAN:  Artist Pais. Mina Marble, M.D.           DATE OF BIRTH:  16-May-1930   DATE OF PROCEDURE:  12/09/2003  DATE OF DISCHARGE:  10/15/2003                                 OPERATIVE REPORT   PREOPERATIVE DIAGNOSIS:  Right wrist and hand abscess.   POSTOPERATIVE DIAGNOSIS:  Right wrist and hand abscess.   PROCEDURE:  Repeat I&D and secondary wound closure.   SURGEON:  Artist Pais. Mina Marble, M.D.   ASSISTANT:  __________   ANESTHESIA:  General.   TOURNIQUET TIME:  25 min.   COMPLICATIONS:  None.   DRAINS:  None.   OPERATIVE REPORT:  The patient was taken to the operating room and after the  induction of adequate general anesthesia, the right upper extremity was  prepped and draped in the usual sterile fashion.  The right dorsal wound of  the wrist and forearm were irrigated and debrided, after removal of packing.  Nonvital tissue was debrided down to stable rims on the medial and lateral  sides of the wound.  The wound was then closed with 3-0 nylon and retention-  type sutures, followed by the application of sterile dressing, Xerofoam 4 x  4's and a volar splint.   The patient tolerated the procedure well and went to recovery room in stable  condition.                                               Artist Pais Mina Marble, M.D.    MAW/MEDQ  D:  12/09/2003  T:  12/09/2003  Job:  [id number]

## 2010-08-12 NOTE — H&P (Signed)
[place]. [facility]  Patient:    [name], [name] Visit Number: [id number] MRN: [id number]          Service Type: MED Location: [phone] Attending Physician:  Eliezer Champagne Dictated by:   Wayne C. Dorna Bloom, M.D. Admit Date:  12/14/2000   CC:         Demetria Pore. Coral Spikes, M.D.   History and Physical  CHIEF COMPLAINT:  Low back pain, right hip pain, with urinary and fecal retention.  HISTORY OF PRESENT ILLNESS:  This is a 77 year old white male with a history of chronic low back pain and right hip pain, as well as DJD of the LS spine and rheumatoid arthritis and a history of fracture of the right hip.  The patient began on September 13 with right lower back pain extending down the right hip and leg.  There were no precipitating factors that he remembers. There was no trauma, no fever.  Initially walking and bending made it worse and lying down made it better.  However, right now lying down seems to make it worse and sitting up makes it better.  The patient was seen initially on September 18 by Dr. Lennox Pippins.  He was taking Tylenol and this was changed to Darvocet.  He was previously on Celebrex 200 mg q.d., increased to b.i.d., and Flexeril 10 mg q.h.s., chronically increased to b.i.d.  Now the patient presents today with an inability to urinate beyond a clinical dribble and no stool for two days.  Initially an x-ray of the right hip on September 18 showed intact fixation of the right hip without acute change.  PAST MEDICAL HISTORY:  1. Rheumatoid arthritis diagnosed in 1987 with chronic polyarthritis,     positive rheumatoid factor, and erosions on hand x-ray.  He is status     post gold and Plaquenil and has been on methotrexate since 1995 and     prednisone since 1998.  At the present time, the patient denies nodules,     dry mouth, tingling of the hands, but he has had history of nodules in the     right and left olecranon bursa.  In addition, the  patient has had carpal     tunnel syndrome related to this with an injection by Dr. Merlyn Lot in the left     hand.  2. Coronary artery disease.  The patient was hospitalized in March 2001 for     acute inferior MI.  He had an extract thrombectomy on March 14 with stent     to right mid coronary artery, left coronary artery without significant     disease, circumflex with mild ectasia, before two obtuse marginals, left     ventricular dysfunction secondary to MI with inferior hypokinesis and an     EF of 55%.  Normal stress test in May of 2001 by Dr. Fraser Din.  He     currently denies chest pain, palpitations, or dizziness, orthopnea, or     PND.  He does get somewhat short of breath with exertion.  In addition on     August 27, 2000, the patient had a Cardiolite stress test which showed small     region of decreased uptake in inferior wall consistent with previous     infarct.  There was evidence of peri-infarct ischemia intact with left     ventricular systolic function with EF of 60%.  3. Hyperlipidemia.  The patient is on Lipitor secondary to this.  4. History of heme-positive stool.  The patient was evaluated for this with a     flexible sigmoidoscopy and barium enema in 1993, which showed scattered     diverticulosis.  Repeat colonoscopy in 1996 by Dr. Sherin Quarry showed     left-sided diverticulosis.  5. Reflux disease on Protonix 40 mg a day, status post dysphagia 1996.     Endoscopy with dilatation.  He denies indigestion, odynophagia, or     dysphagia now.  He has had some abdominal discomfort, but this may be from     constipation.  6. Chronic low back pain, 1998.  The patient had back pain and sedimentation     rate, protein electrophoresis, UA, and CBC were unremarkable, and this was     the result.  He was seen for right low back pain again in February 2000.     He had LS spine showing degenerative change at L3-L4 and L5-S1 with     moderate thoracolumbar scoliosis.  Subsequent sed  rate, CBC, and     immunoelectrophoresis were normal.  Bone scan showed mild DJD but no     acute change, healing fracture in the left anterior ribs.  He still gets     intermittent back pain, but this is worse, as he has not had bowel or     bladder dysfunction previously.  Subsequent LS spine x-ray on May 8     showed slight progression of DJD throughout lumbosacral spine.  He then     had a TSH, PSA, sed rate, urinalysis, and SPEP which were normal.  The     patient was put on Flexeril and Celebrex at that time which he would     usually take once a day.  Health maintenance, flu vaccine yearly, tetanus     vaccine 1994, Pneumovax 1998.  ALLERGIES:  The patient has no known drug allergies.  MEDICATIONS:  1. Sublingual nitroglycerin p.r.n.  2. Methotrexate 20 mg q.w.  3. Folic acid 1 mg q.d.  4. Extra Strength Tylenol.  5. Altace 1.25 mg q.d.  6. Prednisone 5 mg q.d.  7. Lipitor 80 mg q.h.s.  8. Protonix 40 mg q.d.  9. Coated aspirin 81 mg q.d. 10. Toprol XL 50 mg q.d. 11. Multivitamin q.d. 12. Calcium 600 plus Vitamin D q.d. 13. Celebrex 200 mg q.d., recently increased of course to b.i.d. with the     back pain. 14. Flexeril 10 mg q.h.s. recently increased to b.i.d. with his episode of     back pain.  PAST SURGICAL HISTORY:  Coronary procedures as mentioned, moles removed by Dr. Jorja Loa in 1992, status post teeth pulled.  Patient has dentures.  Also status post right hip repair in 1989.  PAST MEDICAL HISTORY:  Also includes decreased hearing, microscopic hematuria in 1999.  He saw Dr. Isabel Caprice with IVP and cysto negative and cytology apparently unremarkable.  Asymptomatic gallstone.  Also chronic chest x-ray scarring of right upper lobe.  The patient had a CT scan to evaluate this in 1989.  INJURIES:  The patient had a fractured pelvis and crushed leg in 1958 due to injury.  SOCIAL/FAMILY HISTORY:  The patient is married and has seven children.  He stopped smoking in 1987,  no alcohol since 1987.  He is a Music therapist, status  post Engineer, maintenance.  Family history of heart disease over age 56, rheumatoid arthritis in sister, diabetes in family.  REVIEW OF SYSTEMS:  Negative for chest pain, shortness of breath, or  headaches, dizzy spells, syncope.  Positive for abdominal discomfort and constipation and urinary retention.  Negative for dysuria.  PHYSICAL EXAMINATION:  VITAL SIGNS:  Blood pressure is 108/74, temperature is 97.6, pulse 74 and regular.  GENERAL:  The patient is able to walk with a limp but cannot lie down without pain.  CARDIOVASCULAR:  Regular rate and rhythm.  LUNGS:  The patients lungs are clear to auscultation.  ABDOMEN:  Soft, normal active bowel sounds, nontender without organomegaly.  EXTREMITIES:  The patient has no clubbing, cyanosis, or edema.  Pulses are intact.  HEENT:  Eyes:  The patients conjunctivae are pink, sclerae white.  Pupils equal, round, and react to light and accommodation.  Funduscopic exam is benign.  Patient has dentures, no lesions in the mouth seen.  No tenderness over the sinuses, nasal mucosa normal.  NECK:  Supple, thyroid not enlarged.  No bruit or adenopathy.  BACK:  The patients LS spine is tender to palpation and percussion.  NEUROLOGIC:  Alert and oriented x 3.  Cranial nerves 2-12 are intact.  Motor exam is 5/5 bilaterally.  Sensation intact to light touch and pinprick.  Deep tendon reflexes are 2+.  EXTREMITIES:  The patient has negative Tinels sign bilaterally.  No nodules, no synovitis in upper and lower extremities.  Eversion of both ankles, bilateral hallus valgus with bunions.  IMPRESSION:  Low back pain with right hip pain with urinary retention.  The patient is on prednisone.  DIFFERENTIAL DIAGNOSES: 1. Muscular spasm. 2. Compression fracture. 3. Degenerative joint disease with exacerbation. 4. Cauda equina syndrome, particularly with this problem with urinary and    bowel dysfunction  versus dysfunction secondary to anticholinergics and    narcotics. Plan for this is to admit, x-ray, MRI, Toradol 60 mg given IM    in the office, narcotic pain control, hold anticholinergics secondary to    retention and neurology consult. 5. Abdominal discomfort with unremarkable exam, probably secondary to    constipation. Add stool softeners. 5. Coronary artery disease, currently stable. 6. Hyperlipidemia on Lipitor.  The patient denies any myalgias and muscles    are nontender and he is not complaining of muscle pain. 7. Reflux disease on Protonix with a hiatal hernia. 8. Chronic heme-positive stools, probably secondary to diverticulosis.    Follow CBC. 9. Rheumatoid arthritis.  Continue prednisone and folic acid right now,    because of the patients current ill state.  Hold the methotrexate.  I    discussed this with Dr. Coral Spikes as well. Dictated by:   Wayne C. Dorna Bloom, M.D. Attending Physician:  Eliezer Champagne DD:  12/14/00 TD:  12/14/00 Job: [id number]

## 2010-08-12 NOTE — Consult Note (Signed)
NAMEBERNADETTE, [name]               ACCOUNT NO.:  [id number]   MEDICAL RECORD NO.:  [id number]          PATIENT TYPE:  EMS   LOCATION:  MAJO                         FACILITY:  [facility]   PHYSICIAN:  Candyce Churn, M.D.DATE OF BIRTH:  March 03, 1931   DATE OF CONSULTATION:  03/26/2004  DATE OF DISCHARGE:  03/26/2004                                   CONSULTATION   FINDINGS:  1.  Left lower-quadrant abdominal pain, likely very-mild diverticulitis.  2.  History of sigmoid diverticulosis.  3.  Nausea and vomiting, resolved.  4.  History of abdominal aortic aneurysm, stable.  5.  History of coronary artery disease with stent to his right coronary      artery and previous thrombectomy.  6.  Hyperlipidemia.  7.  Microscopic hematuria and benign prostatic hypertrophy.  8.  Chronic low-back pain.  9.  Degenerative joint disease of the right hip with pending right hip      replacement.  10. History of spinal epidural abscess with Staphylococcus aureus sensitive      to Ancef in September, 2002, treated at [facility].  11. Status post laparoscopic repair of a paraesophageal hernia with Nissen      fundoplication, Dr. Adolph Pollack in September of 2003.  12. Status post right total hip replacement, and status post removal of      hardware from fracture in January, 2004, Dr. Turner Daniels.  13. Status post Staphylococcus aureus infection and cellulitis right hand      with surgery with drainage, Dr. Artist Pais. [place].   MEDICATIONS:  1.  Prednisone 5 mg a day.  2.  Ibuprofen 800 mg b.i.d. p.r.n.  3.  Lipitor 80 mg daily.  4.  Folic acid 1 mg daily.  5.  Calcium 600 mg with D once daily.  6.  Multivitamin once daily.  7.  Altace 2.5 mg daily.  8.  Toprol XL, 50 mg a day.  9.  Protonix 40 mg daily.  10. Flomax 0.4 mg daily.  11. Zetia 10 mg a day.  12. Aspirin 81 mg daily.  13. We will be adding Augmentin 875 mg p.o. b.i.d. for ten days.   HOSPITAL COURSE:  [name]  developed some left lower-quadrant pain  approximately five days ago in the early morning, and noticed at the time  that he was wakened that he needed to urinate in the early morning hours.  This pain was intermittent for a couple of days.  Two days later, he took  Pepto-Bismol, and had loose BM's on the following day that were dark in  color.   Approximately 2:30 a.m. this morning, [place] and had a severe pain in his left lower quadrant and had nausea and  vomiting five or six times, no diarrhea, no chills, no obvious fever.  He  kept having occasional, severe, crampy left lower quadrant pain, and he came  to [facility] for workup.  He was initially seen in the medial walk-in  clinic and again felt to have lower quadrant pain, and was  referred to [facility] for further evaluation for more appropriate imaging.   The patient was seen in [facility] ER, but very much non-distressed.  He  stated that his pain had not reoccurred.   However, on examination, though his vital signs were normal, his chest was  clear, his abdomen was soft, he did have some tenderness in the left lower  quadrant pain with palpation which was reproducible, but he had no rebound.   LABORATORY DATA:  Laboratories in the emergency room revealed a white count  of 7600, hemoglobin 14, platelet count 198,000 with normal differential.  Pro-time was 13 seconds, PTT 27 seconds, sodium 142, potassium 4.1, chloride  111, bicarbonate 28, glucose 97, BUN 15, creatinine 1.0.  LFT's were normal  were an albumin slightly low at 3.4.  Urinalysis was within normal limits.   Abdominal pelvic CT scans were unremarkable.   ASSESSMENT:  Clinically, I think the patient has very low-grade  diverticulitis.  He has become somewhat constipated and is having some  tenderness in the left lower quadrant.  I think this may be causing some  colonic spasms and causing pain.   PLAN:  We will treat  with Augmentin 875 mg b.i.d. x7 days.  The patient will  notify us if the pain does not completely resolve.   I have asked him to see Dr. Coral Spikes, next week, and to call on Tuesday,  March 29, 2004, for an appointment next week.  Should he acutely worsen, he  should notify us immediately.   I should mention that his AAA  was stable in size from previous examination.      [place]obe   RNG/MEDQ  D:  03/27/2004  T:  03/27/2004  Job:  [id number]   cc:   Demetria Pore. Coral Spikes, M.D.  [address]  [address]  [place]  [place] [address]  Fax: [phone]

## 2010-08-12 NOTE — H&P (Signed)
[name], [name]                         ACCOUNT NO.:  [id number]   MEDICAL RECORD NO.:  [id number]                   PATIENT TYPE:  INP   LOCATION:  5040                                 FACILITY:  [facility]   PHYSICIAN:  Artist Pais. Mina Marble, M.D.           DATE OF BIRTH:  Aug 20, 1930   DATE OF ADMISSION:  10/11/2003  DATE OF DISCHARGE:                                HISTORY & PHYSICAL   PREOPERATIVE DIAGNOSIS:  Deep abscess, right hand and wrist.   POSTOPERATIVE DIAGNOSIS:  Deep abscess, right hand and wrist.   PROCEDURE:  Repeat irrigation and debridement and secondary wound closure.   SURGEON:  Artist Pais. Mina Marble, M.D.   ASSISTANT:  None.   ANESTHESIA:  General.   TOURNIQUET TIME:  Twenty-five minutes.   COMPLICATIONS:  No complications.   DRAINS:  No drains.   OPERATIVE REPORT:  Patient was taken to the operating room and after the  induction of adequate general anesthesia, the left upper extremity was  prepped and draped in the usual sterile fashion.  An Esmarch was used to  exsanguinate the limb.  Tourniquet was inflated to 350 mmHg.  At this point  in time, his previous incision was irrigated and debrided using 6 L of  normal saline using a power irrigator.  Devitalized tissue was debrided.  It  was then closed loosely with 3-0 nylon in horizontal mattress suture.  A  sterile dressing of Xeroform, 4 x 4's, fluffs and a volar splint were  applied.  The patient tolerated the procedure well and went to recovery room  in stable fashion.                                                Artist Pais Mina Marble, M.D.    MAW/MEDQ  D:  10/14/2003  T:  10/14/2003  Job:  [id number]

## 2010-08-16 ENCOUNTER — Encounter (HOSPITAL_BASED_OUTPATIENT_CLINIC_OR_DEPARTMENT_OTHER)
Admission: RE | Admit: 2010-08-16 | Discharge: 2010-08-16 | Disposition: A | Discharge status: Home / Self Care | Payer: Medicare Other | Source: Ambulatory Visit | Attending: Orthopedic Surgery | Admitting: Orthopedic Surgery

## 2010-08-16 LAB — BASIC METABOLIC PANEL
CO2: 26 mEq/L (ref 19–32)
Calcium: 9.6 mg/dL (ref 8.4–10.5)
Chloride: 106 mEq/L (ref 96–112)
Creatinine, Ser: 0.6 mg/dL (ref 0.4–1.5)
GFR calc Af Amer: >60 mL/min (ref >60)
Glucose, Bld: 93 mg/dL (ref 70–99)
Sodium: 141 mEq/L (ref 135–145)

## 2010-08-17 ENCOUNTER — Ambulatory Visit (HOSPITAL_COMMUNITY)
Admission: RE | Admit: 2010-08-17 | Discharge: 2010-08-17 | Disposition: A | Discharge status: Home / Self Care | Payer: Medicare Other | Source: Ambulatory Visit | Attending: Orthopedic Surgery | Admitting: Orthopedic Surgery

## 2010-08-17 ENCOUNTER — Ambulatory Visit (HOSPITAL_BASED_OUTPATIENT_CLINIC_OR_DEPARTMENT_OTHER): Admission: RE | Admit: 2010-08-17 | Payer: Medicare Other | Source: Ambulatory Visit | Admitting: Orthopedic Surgery

## 2010-08-17 DIAGNOSIS — M069 Rheumatoid arthritis, unspecified: Secondary | ICD-10-CM | POA: Insufficient documentation

## 2010-08-17 DIAGNOSIS — J449 Chronic obstructive pulmonary disease, unspecified: Secondary | ICD-10-CM | POA: Insufficient documentation

## 2010-08-17 DIAGNOSIS — M65849 Other synovitis and tenosynovitis, unspecified hand: Secondary | ICD-10-CM | POA: Insufficient documentation

## 2010-08-17 DIAGNOSIS — I10 Essential (primary) hypertension: Secondary | ICD-10-CM | POA: Insufficient documentation

## 2010-08-17 DIAGNOSIS — M65839 Other synovitis and tenosynovitis, unspecified forearm: Secondary | ICD-10-CM | POA: Insufficient documentation

## 2010-08-17 DIAGNOSIS — I2510 Atherosclerotic heart disease of native coronary artery without angina pectoris: Secondary | ICD-10-CM | POA: Insufficient documentation

## 2010-08-17 DIAGNOSIS — J4489 Other specified chronic obstructive pulmonary disease: Secondary | ICD-10-CM | POA: Insufficient documentation

## 2010-08-17 DIAGNOSIS — M66339 Spontaneous rupture of flexor tendons, unspecified forearm: Secondary | ICD-10-CM | POA: Insufficient documentation

## 2010-08-17 DIAGNOSIS — IMO0002 Reserved for concepts with insufficient information to code with codable children: Secondary | ICD-10-CM | POA: Insufficient documentation

## 2010-08-17 LAB — CBC
Hemoglobin: 13.1 g/dL (ref 13.0–17.0)
MCH: 31 pg (ref 26.0–34.0)
MCHC: 33.1 g/dL (ref 30.0–36.0)
MCV: 93.8 fL (ref 78.0–100.0)
RBC: 4.22 MIL/uL (ref 4.22–5.81)

## 2010-08-17 LAB — SURGICAL PCR SCREEN: Staphylococcus aureus: NEGATIVE

## 2010-09-01 ENCOUNTER — Telehealth: Payer: Self-pay | Admitting: *Deleted

## 2010-09-01 NOTE — Telephone Encounter (Signed)
Traci Sermon volunteered to see the pt tomorrow if he is agreeable. I called back & left a message . Will schedule if ok with pt

## 2010-09-01 NOTE — Telephone Encounter (Signed)
Wife called upset that the infection has not completely gone away. States it is draining a clear fluid with slight blood tinge. Says the finger is swollen & that Dr. Orlan Leavens has removed half of the stitches. She went thru the hx of this most recent problem. She has an appt for him on 09/19/10 but thinks he should be seen sooner. Also thinks he needs another antibiotic as she does not think this one is working as well as it should. Told her I will check with Dr. Daiva Eves when he returns Monday & call her with his response [phone]

## 2010-09-08 ENCOUNTER — Telehealth (INDEPENDENT_AMBULATORY_CARE_PROVIDER_SITE_OTHER): Payer: Medicare Other | Admitting: *Deleted

## 2010-09-08 DIAGNOSIS — L0291 Cutaneous abscess, unspecified: Secondary | ICD-10-CM

## 2010-09-08 MED ORDER — AMOXICILLIN-POT CLAVULANATE 875-125 MG PO TABS: 1.0000 | ORAL_TABLET | Freq: Two times a day (BID) | ORAL | Status: AC

## 2010-09-08 NOTE — Telephone Encounter (Signed)
I will call. I am already double booked with a new patient for START at 845 but they will not show up in the schedule since no one can workout how to add pts in [place] abscence

## 2010-09-08 NOTE — Telephone Encounter (Signed)
He has an appt with Dr. Daiva Eves on the 25th & wife is insistent that he be seen sooner as it is "smoldering" and not getting any better. Told her I will check with md & see if can doublebook on Monday. She wants a call back asap [phone]

## 2010-09-08 NOTE — Telephone Encounter (Signed)
Discussed case with pts wife. Dr. [name] tried to repair tendon and apparently the pt is doing worse despite amoxicillin. [name] is contemplating surgical re-exploration which is key. I will change to augmentin to continue for a month for now. Pt will fu with me on 25th and with Ormtann in 2 wks time. If he worsens will try to work in here vs ortmann vs admit for surgery. At present is more smoldering rather than rapidly deteriorating

## 2010-09-19 ENCOUNTER — Encounter: Payer: Self-pay | Admitting: Infectious Disease

## 2010-09-19 ENCOUNTER — Ambulatory Visit (INDEPENDENT_AMBULATORY_CARE_PROVIDER_SITE_OTHER): Payer: Medicare Other | Admitting: Infectious Disease

## 2010-09-19 VITALS — BP 146/80 | HR 66 | Temp 97.5°F | Wt 201.2 lb

## 2010-09-19 DIAGNOSIS — M659 Synovitis and tenosynovitis, unspecified: Secondary | ICD-10-CM

## 2010-09-19 DIAGNOSIS — B950 Streptococcus, group A, as the cause of diseases classified elsewhere: Secondary | ICD-10-CM

## 2010-09-19 DIAGNOSIS — L02519 Cutaneous abscess of unspecified hand: Secondary | ICD-10-CM

## 2010-09-19 LAB — CBC WITH DIFFERENTIAL/PLATELET
Eosinophils Relative: 3 % (ref 0–5)
HCT: 44.6 % (ref 39.0–52.0)
Hemoglobin: 14.4 g/dL (ref 13.0–17.0)
Lymphocytes Relative: 17 % (ref 12–46)
Lymphs Abs: 1.2 10*3/uL (ref 0.7–4.0)
MCV: 95.9 fL (ref 78.0–100.0)
Monocytes Absolute: 0.4 10*3/uL (ref 0.1–1.0)
Monocytes Relative: 6 % (ref 3–12)
Neutro Abs: 5.3 10*3/uL (ref 1.7–7.7)
RBC: 4.65 MIL/uL (ref 4.22–5.81)
RDW: 14.3 % (ref 11.5–15.5)
WBC: 7.2 10*3/uL (ref 4.0–10.5)

## 2010-09-19 LAB — C-REACTIVE PROTEIN: CRP: 0.1 mg/dL (ref <0.6)

## 2010-09-19 LAB — BASIC METABOLIC PANEL
BUN: 21 mg/dL (ref 6–23)
CO2: 24 mEq/L (ref 19–32)
Chloride: 110 mEq/L (ref 96–112)
Creat: 0.75 mg/dL (ref 0.50–1.35)
Glucose, Bld: 101 mg/dL — ABNORMAL HIGH (ref 70–99)
Potassium: 4.8 mEq/L (ref 3.5–5.3)

## 2010-09-19 NOTE — Assessment & Plan Note (Signed)
I am bothered by the persistent drainage from his hands. This could be due to a deeper infection . He may indeed need reexploration of the space. For now continue him on Augmentin and he will follow Dr.Ortman later this weekCertainly should he have evidence of deep infection requiring reexploration of the wound and repeat surgery I would recommend getting cultures once again and then giving him again a course of effective systemic antibiotics.

## 2010-09-19 NOTE — Assessment & Plan Note (Signed)
Group A strep was the culprit organism and his recent infection. We are covering for this as well as anaerobes with Augmentin.

## 2010-09-19 NOTE — Assessment & Plan Note (Signed)
See above discussion

## 2010-09-19 NOTE — Patient Instructions (Signed)
Continue the augmentin Followup with Dr. [name] Make followup appt with Dr Daiva Eves for July the 30th

## 2010-09-19 NOTE — Progress Notes (Signed)
Subjective:    [name], male    DOB: 01-05-1931, 77 y.o.   MRN: [id number]  HPI  [name] is a 77 year old Caucasian male with  rheumatoid arthritis and infectious disease complications including prior methicillin-sensitive  Staphylococcus aureus epidural abscess, and ankle septic arthritis, t reated with cefazolin, then history of right wrist and hand abscess  again with methicillin-sensitive Staphylococcus aureus, treated with cefazolin and followed by my partner, Dr. Ninetta Lights. The patient had been doing relatively well until the Friday prior to admission when developed  erythema involving his finger this progressed and erythema extended up into his arm and elbow. He sought care with his wife at a local hospital in [facility], but when they were discouraged, ultimately 0brought the patient to [facility] on the 8th. At that time, he had extensive erythema of his right upper extremity involving the right middle finger with 2 enter points of middle finger, posterior aspect of elbow as well and erythema extending from the hand all the way the lower third of the arm posteriorly. He was admitted to the hospitalist service and Hand Surgery was consulted. The patient was taken to the  operating room where he had a right long finger incision and debridement of the flexor tendon sheath and right debridement of the skin and subcutaneous tissues and drainage of an abscess. This was done on the 8th. He was placed on vancomycin and Zosyn on the 7th and then changed to vancomycin and cefepime postoperatively. These antibiotics were continued until his culture came back with pure group A Streptococci and he was changed to Rocephin. I saw him in the hospital in April and continued him on intravenous Rocephin. He was discharged on this and comple more than a month with systemic intravenous antibiotics. I saw him in clinic in May and we changed him to high-dose oral amoxicillin. In the interim  however he had inability to flex his long finger and was found to have additional tendon ruptures. In May underwent the following surgery 1. Right long finger tenosynovectomy flexor digitorum superficialis     and flexor digitorum profundus. 2. Right long finger excision of the flexor digitorum superficialis and the palm. 3. Repair at flexor digitorum profundus zone to flexor tendon repair. He continued on oral amoxicillin. The patient's wife called me due to concerns about the patient having persistent drainage from his hand. Patient was being followed closely by Dr. Zella Ball and apparently according to the patient's wife there is concern for need for reexploration of the surgical site. Certainly if he is failing affective antibiotics that would be my principal concern. I did change him from amoxicillin to Augmentin to broaden his coverage to get better anaerobic coverage in case there is a smoldering group A strep with anaerobic pathogens playing a role in this. Since being changed to Augmentin there has been ruled slight change with slight decreased amount of discharge. Patient is having more frequent dressing changes to being done by his wife. He denies fevers and chills but as mentioned is still complaining of the drainage on his wound. He also denies nausea or malaise.      We spent over 45 minutes this patient greater than 50% time and recording care and counseling the patient.   Review of Systems    as in history of present illness otherwise remainder of 12 point review of systems is negative. Objective:   Physical Exam    the patient is alert and oriented x4.  HEENT normocephalic atraumatic pupils equal round reactive to light sclerae anicteric oropharynx clear without exudate or thrush neck supple cardiovascular exam regular rate and rhythm no murmurs gallops or rubs lungs were clear to auscultation without wheezes rhonchi or rales his abdomen is soft nondistended nontender with positive bowel  sounds his lower extremity his are without edema musculoskeletal his right hand was in a splint which he removed and bandage was removed. He had an open area on the palm which still is draining some clear material which I swabbed and sent for culture.    Assessment & Plan:  Hand abscess I am bothered by the persistent drainage from his hands. This could be due to a deeper infection . He may indeed need reexploration of the space. For now continue him on Augmentin and he will follow Dr.Ortman later this weekCertainly should he have evidence of deep infection requiring reexploration of the wound and repeat surgery I would recommend getting cultures once again and then giving him again a course of effective systemic antibiotics.   Group A streptococcal infection Group A strep was the culprit organism and his recent infection. We are covering for this as well as anaerobes with Augmentin.  Tenosynovitis See above discussion.

## 2010-09-21 ENCOUNTER — Other Ambulatory Visit (INDEPENDENT_AMBULATORY_CARE_PROVIDER_SITE_OTHER): Payer: Medicare Other | Admitting: Licensed Clinical Social Worker

## 2010-09-21 DIAGNOSIS — A4901 Methicillin susceptible Staphylococcus aureus infection, unspecified site: Secondary | ICD-10-CM

## 2010-09-21 MED ORDER — AMOXICILLIN-POT CLAVULANATE 875-125 MG PO TABS: 1.0000 | ORAL_TABLET | Freq: Two times a day (BID) | ORAL | Status: DC

## 2010-09-22 LAB — WOUND CULTURE
Gram Stain: NONE SEEN
Gram Stain: NONE SEEN

## 2010-09-24 NOTE — Op Note (Signed)
[name], [name]               ACCOUNT NO.:  [id number]  MEDICAL RECORD NO.:  [id number]           PATIENT TYPE:  O  LOCATION:  [facility]                         FACILITY:  [facility]  PHYSICIAN:  [name], MD  DATE OF BIRTH:  1930-05-11  DATE OF PROCEDURE:  08/17/2010 DATE OF DISCHARGE:  08/17/2010                              OPERATIVE REPORT   PREOPERATIVE DIAGNOSIS:  Right long finger flexor tenosynovitis with attritional rupture of the flexor digitorum superficialis and flexor digitorum profundus.  POSTOPERATIVE DIAGNOSIS:  Right long finger flexor tenosynovitis with attritional rupture of the flexor digitorum superficialis and flexor digitorum profundus.  ATTENDING PHYSICIAN:  Sharma Covert IV, MD, scrubbed and present during the entire procedure.  ASSISTANT SURGEON:  None.  SURGICAL PROCEDURES: 1. Right long finger tenosynovectomy flexor digitorum superficialis     and flexor digitorum profundus. 2. Right long finger excision of the flexor digitorum superficialis     and the palm. 3. Repair at flexor digitorum profundus zone to flexor tendon repair.  ANESTHESIA:  General via LMA.  TIME:  Approximately less than 90 minutes.  INTRAOPERATIVE FINDINGS:  The patient did have attritional ruptures of both the FDS and FDP.  These were just around the region of the flexor sheath just at the entrance of the flexor sheath.  The patient's FDS was of very poor continuity.  The patient did have a salvageable FDP tendon.  INDICATIONS FOR PROCEDURE:  [name] is a right-hand-dominant gentleman who sustained a bad flexor tenosynovitis of his long finger.  The patient was treated appropriately and healed.  However, he had attritional rupture of both the FDS and FDP.  The patient presented to the office and, based on his inability to use the long finger, it was recommended that he undergo flexor sheath exploration and possible tendon repair.  The risks, benefits and  alternatives were discussed in detail with the patient and signed informed consent was obtained.  The risks include but are not limited to bleeding, infection, damage to nearby nerves, arteries or tendons, tendon rupture, loss of motion of the wrist and digits, persistent infection, and need for further surgical intervention.  DESCRIPTION OF PROCEDURE:  The patient was identified in the preoperative holding area and a mark made on the right long finger to indicate the correct operative site.  The patient was brought back to the operating room and placed supine on the anesthesia room table where general anesthesia was administered.  The patient tolerated this well. A well-padded tourniquet was then placed on the right upper extremity and sealed with a 1000 drape.  The right upper extremity was then prepped and draped in the normal sterile fashion.  A time-out was called, the correct side was identified and the procedure was then begun.  Attention was then turned to the right hand where a Lorrene Reid shaped incision was made over the long finger.  Dissection was then carried down through the skin and subcutaneous tissues, extending into the palm.  The flexor tendons were then identified.  A radical tenosynovectomy was then carried out of both tendons, especially the FDP, since this was  felt to be a salvageable tendon.  The FDS was in very poor quality.  The FDS was then excised in the palm and the distal portion of the FDS was then left in place, attached to the volar plate with its insertion of the middle phalanx.  This allowed for adequate space within the flexor sheath.  The FDP was then identified in the palm with a lumbrical attached.  The FDP was then brought distally and the distal extent was then fed beneath the A2 pulley and then the tendon was repaired just past the entrance of the A2 pulley.  The wound was then thoroughly irrigated at every level.  Using a 4-0 FiberWire suture,  the modified Kessler 6 core strand suture was then used to repair the FDP. This was reinforced with several more FiberWire sutures, making sure that the knots were not on the ventral surface to allow for tendon gliding.  There was a good repair of the tendon.  This was supplemented by several more FiberWire sutures, almost creating a near 8 to 10 strand repair, at least an 8 strand repair.  The finger was then thoroughly irrigated.  The skin flaps were then closed with simple nylon sutures. The tourniquet was deflated with good perfusion of the fingertips. Adaptic dressing and a sterile compressive bandage were then applied and 10 cc of 0.25% Marcaine was infiltrated locally.  The patient was then placed in a bulky hand dressing and a dorsal blocking splint.  The patient was extubated and taken to the recovery room in good condition.  POSTOPERATIVE PLAN:  The patient was discharged home to be seen back in the office in approximately 7 days for a wound check and then begin a postoperative zone II flexor tendon repair protocol for the FDP.  A very guarded prognosis given the significant fraying and condition of his tendons, but at least hopefully we can give him some function of the FDP so that the finger does not stay in full or near-full extension.     [name], MD     FWO/MEDQ  D:  08/20/2010  T:  08/20/2010  Job:  [id number]  Electronically Signed by [name] IV MD on 09/24/2010 08:44:32 PM

## 2010-10-10 ENCOUNTER — Ambulatory Visit (HOSPITAL_COMMUNITY)
Admission: RE | Admit: 2010-10-10 | Discharge: 2010-10-10 | Disposition: A | Discharge status: Home / Self Care | Payer: Medicare Other | Source: Ambulatory Visit | Attending: Orthopedic Surgery | Admitting: Orthopedic Surgery

## 2010-10-10 DIAGNOSIS — L089 Local infection of the skin and subcutaneous tissue, unspecified: Secondary | ICD-10-CM | POA: Insufficient documentation

## 2010-10-10 DIAGNOSIS — K219 Gastro-esophageal reflux disease without esophagitis: Secondary | ICD-10-CM | POA: Insufficient documentation

## 2010-10-10 DIAGNOSIS — Z01812 Encounter for preprocedural laboratory examination: Secondary | ICD-10-CM | POA: Insufficient documentation

## 2010-10-10 DIAGNOSIS — I2510 Atherosclerotic heart disease of native coronary artery without angina pectoris: Secondary | ICD-10-CM | POA: Insufficient documentation

## 2010-10-10 DIAGNOSIS — Z538 Procedure and treatment not carried out for other reasons: Secondary | ICD-10-CM | POA: Insufficient documentation

## 2010-10-10 LAB — CBC
HCT: 44.3 % (ref 39.0–52.0)
MCHC: 34.1 g/dL (ref 30.0–36.0)
Platelets: 177 10*3/uL (ref 150–400)
RDW: 13.9 % (ref 11.5–15.5)
WBC: 6.6 10*3/uL (ref 4.0–10.5)

## 2010-10-10 LAB — BASIC METABOLIC PANEL
BUN: 19 mg/dL (ref 6–23)
Chloride: 109 mEq/L (ref 96–112)
Creatinine, Ser: 0.69 mg/dL (ref 0.50–1.35)
GFR calc Af Amer: >60 mL/min (ref >60)
GFR calc non Af Amer: >60 mL/min (ref >60)
Potassium: 4.2 mEq/L (ref 3.5–5.1)

## 2010-10-10 LAB — SURGICAL PCR SCREEN: MRSA, PCR: NEGATIVE

## 2010-10-14 ENCOUNTER — Ambulatory Visit: Payer: Medicare Other | Admitting: Infectious Disease

## 2010-10-17 NOTE — Op Note (Signed)
  NAMESHYHEEM, WHITHAM               ACCOUNT NO.:  [id number]  MEDICAL RECORD NO.:  [id number]  LOCATION:  [facility]                         FACILITY:  [facility]  PHYSICIAN:  [name], MD  DATE OF BIRTH:  1931-01-03  DATE OF PROCEDURE:  10/10/2010 DATE OF DISCHARGE:  10/10/2010                              OPERATIVE REPORT   OPERATIVE COMMENT:  The patient's surgery was cancelled today.  The patient's hand looks better.  It is not draining any fluid and has not drained in the last 3 days.  After having a long discussion with he and his wife, we are going to postpone the surgery.  If he were to have persistent drainage or any worsening of his infection, the patient will require the scheduled case today.  All questions were answered with Mr. Fehringer and he will be discharged on the oral antibiotics.  The patient has voiced understanding of the plan.     [name], MD     FWO/MEDQ  D:  10/10/2010  T:  10/10/2010  Job:  [id number]  Electronically Signed by [name] IV MD on 10/17/2010 09:57:53 PM

## 2010-10-19 ENCOUNTER — Telehealth: Payer: Self-pay | Admitting: Licensed Clinical Social Worker

## 2010-10-19 NOTE — Telephone Encounter (Signed)
Patient was supposed to have surgery last Monday 10/10/2010 but Dr. Orlan Leavens cancelled it because his hand was better. He is to continue amoxicillin for 3 weeks until he visits with Dr. Orlan Leavens again. Patient wants to know if he needs to be see by Dr. Daiva Eves right now,  he was scheduled to be seen on 10/14/2010 but the appointment was cancelled by our office due to the physician's flight delay.

## 2010-10-19 NOTE — Telephone Encounter (Signed)
I would have him continue the antibiotics and see me on the 30th

## 2010-10-24 ENCOUNTER — Encounter: Payer: Self-pay | Admitting: Infectious Disease

## 2010-10-24 ENCOUNTER — Ambulatory Visit (INDEPENDENT_AMBULATORY_CARE_PROVIDER_SITE_OTHER): Payer: Medicare Other | Admitting: Infectious Disease

## 2010-10-24 VITALS — BP 125/82 | HR 94 | Temp 97.4°F | Wt 200.0 lb

## 2010-10-24 DIAGNOSIS — L02519 Cutaneous abscess of unspecified hand: Secondary | ICD-10-CM

## 2010-10-24 DIAGNOSIS — M659 Synovitis and tenosynovitis, unspecified: Secondary | ICD-10-CM

## 2010-10-24 DIAGNOSIS — A4901 Methicillin susceptible Staphylococcus aureus infection, unspecified site: Secondary | ICD-10-CM

## 2010-10-24 DIAGNOSIS — B950 Streptococcus, group A, as the cause of diseases classified elsewhere: Secondary | ICD-10-CM

## 2010-10-24 LAB — CBC WITH DIFFERENTIAL/PLATELET
Basophils Absolute: 0 10*3/uL (ref 0.0–0.1)
Eosinophils Absolute: 0.2 10*3/uL (ref 0.0–0.7)
Eosinophils Relative: 3 % (ref 0–5)
MCH: 31.1 pg (ref 26.0–34.0)
MCV: 94.9 fL (ref 78.0–100.0)
Neutrophils Relative %: 60 % (ref 43–77)
Platelets: 211 10*3/uL (ref 150–400)
RDW: 13.7 % (ref 11.5–15.5)
WBC: 6 10*3/uL (ref 4.0–10.5)

## 2010-10-24 MED ORDER — AMOXICILLIN-POT CLAVULANATE 875-125 MG PO TABS: 1.0000 | ORAL_TABLET | Freq: Two times a day (BID) | ORAL | Status: DC

## 2010-10-24 NOTE — Assessment & Plan Note (Signed)
See discussion of tendon infection above

## 2010-10-24 NOTE — Progress Notes (Signed)
Subjective:    [name], male    DOB: 06-14-1930, 77 y.o.   MRN: [id number]  HPI  [name] is a 77 year old Caucasian male with rheumatoid arthritis and infectious disease complications including prior methicillin-sensitive  Staphylococcus aureus epidural abscess, and ankle septic arthritis, t reated with cefazolin, then history of right wrist and hand abscess  again with methicillin-sensitive Staphylococcus aureus, treated with cefazolin and followed by my partner, Dr. Ninetta Lights. The patient had been doing relatively well until the Friday prior to admission when developed  erythema involving his finger this progressed and erythema extended up into his arm and elbow. He sought care with his wife at a local hospital in [facility], but when they were discouraged, ultimately 0brought the patient to [facility] on the 8th. At that time, he had extensive erythema of his right upper extremity involving the right middle finger with 2 enter points of middle finger, posterior aspect of elbow as well and erythema extending from the hand all the way the lower third of the arm posteriorly. He was admitted to the hospitalist service and Hand Surgery was consulted. The patient was taken to the  operating room where he had a right long finger incision and debridement of the flexor tendon sheath and right debridement of the skin and subcutaneous tissues and drainage of an abscess. This was done on the 8th. He was placed on vancomycin and Zosyn on the 7th and then changed to vancomycin and cefepime postoperatively. These antibiotics were continued until his culture came back with pure group A Streptococci and he was changed to Rocephin. I saw him in the hospital in April and continued him on intravenous Rocephin. He was discharged on this and comple more than a month with systemic intravenous antibiotics. I saw him in clinic in May and we changed him to high-dose oral amoxicillin. In the interim  however he had inability to flex his long finger and was found to have additional tendon ruptures. In May underwent the following surgery 1. Right long finger tenosynovectomy flexor digitorum superficialis and flexor digitorum profundus. 2. Right long finger excision of the flexor digitorum superficialis and the palm. 3. Repair at flexor digitorum profundus zone to flexor tendon repair. He continued on oral amoxicillin. The patient's wife called me due to concerns about the patient having persistent drainage from his hand. Patient was being followed closely by Dr. Zella Ball and apparently according to the patient's wife there is concern for need for reexploration of the surgical site. Certainly if he is failing affective antibiotics that would be my principal concern. I did change him from amoxicillin to Augmentin to broaden his coverage to get better anaerobic coverage in case there is a smoldering group A strep with anaerobic pathogens playing a role in this. Since being changed to Augmentin there had beendecreased amount of discharge. He was seen by Dr. [name] for planned surgical exploration of wrist but when Dr. Orlan Leavens examined the patient in the OR he was impressed by improved appearance. The patients drainage stopped altogether 2 weeks ago. He has no pain and erythema has resolved as well.  He denies fevers and chills. He has slighlty improved passive range of motion.  Review of Systems  Constitutional: Negative for fever, chills, diaphoresis, activity change, appetite change, fatigue and unexpected weight change.  HENT: Negative for congestion, sore throat, rhinorrhea, sneezing, trouble swallowing and sinus pressure.   Eyes: Negative for photophobia and visual disturbance.  Respiratory: Negative for cough, chest  tightness, shortness of breath, wheezing and stridor.   Cardiovascular: Negative for chest pain, palpitations and leg swelling.  Gastrointestinal: Negative for nausea, vomiting, abdominal  pain, diarrhea, constipation, blood in stool, abdominal distention and anal bleeding.  Genitourinary: Negative for dysuria, hematuria, flank pain and difficulty urinating.  Musculoskeletal: Negative for myalgias, back pain, joint swelling, arthralgias and gait problem.  Skin: Positive for wound. Negative for color change, pallor and rash.  Neurological: Negative for dizziness, tremors, weakness and light-headedness.  Hematological: Negative for adenopathy. Does not bruise/bleed easily.  Psychiatric/Behavioral: Negative for behavioral problems, confusion, sleep disturbance, dysphoric mood, decreased concentration and agitation.       Objective:   Physical Exam  Constitutional: He is oriented to person, place, and time. He appears well-developed and well-nourished. No distress.  HENT:  Head: Normocephalic and atraumatic.  Mouth/Throat: Oropharynx is clear and moist. No oropharyngeal exudate.  Eyes: Conjunctivae and EOM are normal. Pupils are equal, round, and reactive to light. No scleral icterus.  Neck: Normal range of motion. Neck supple. No JVD present.  Cardiovascular: Normal rate, regular rhythm and normal heart sounds.  Exam reveals no gallop and no friction rub.   No murmur heard. Pulmonary/Chest: Effort normal and breath sounds normal. No respiratory distress. He has no wheezes. He has no rales. He exhibits no tenderness.  Abdominal: He exhibits no distension and no mass. There is no tenderness. There is no rebound and no guarding.  Musculoskeletal: He exhibits no edema and no tenderness.       Right forearm: He exhibits deformity.       RA nodules  Lymphadenopathy:    He has no cervical adenopathy.  Neurological: He is alert and oriented to person, place, and time. He has normal reflexes. He exhibits normal muscle tone. Coordination normal.  Skin: Skin is warm and dry. He is not diaphoretic. No pallor.          Small area where prior drainage was present now with clear tissue  that patient claims is part of his tendon.  Psychiatric: He has a normal mood and affect. His behavior is normal. Judgment and thought content normal.          Assessment & Plan:   No problem-specific assessment & plan notes found for this encounter. Tenosynovitis He seems clinically to be resolving this infection. His ESR was 1 and CRP was 0.1 and the appearance has improved nicely. Will recheck ESR and CRP keeping in mind that they could always go up with his RA itself. I am fine with continuing his antibiotics until he sees Dr. [name] again but I think he can then stop his augmentin, unless something changes clinically.  Hand abscess See discussion of tendon infection above  Group A streptococcal infection We broadened to augmentin in case there were some synergistic anaerobes in play. Plan for antibiotics as above

## 2010-10-24 NOTE — Assessment & Plan Note (Signed)
We broadened to augmentin in case there were some synergistic anaerobes in play. Plan for antibiotics as above

## 2010-10-24 NOTE — Assessment & Plan Note (Signed)
He seems clinically to be resolving this infection. His ESR was 1 and CRP was 0.1 and the appearance has improved nicely. Will recheck ESR and CRP keeping in mind that they could always go up with his RA itself. I am fine with continuing his antibiotics until he sees Dr. [name] again but I think he can then stop his augmentin, unless something changes clinically.

## 2010-10-25 LAB — BASIC METABOLIC PANEL WITH GFR
Calcium: 9.5 mg/dL (ref 8.4–10.5)
Creat: 0.76 mg/dL (ref 0.50–1.35)
GFR, Est African American: >60 mL/min (ref >60)
GFR, Est Non African American: >60 mL/min (ref >60)
Sodium: 143 mEq/L (ref 135–145)

## 2010-10-25 LAB — SEDIMENTATION RATE: Sed Rate: 1 mm/hr (ref 0–16)

## 2010-11-23 ENCOUNTER — Ambulatory Visit (INDEPENDENT_AMBULATORY_CARE_PROVIDER_SITE_OTHER): Payer: Medicare Other | Admitting: Infectious Disease

## 2010-11-23 ENCOUNTER — Encounter: Payer: Self-pay | Admitting: Infectious Disease

## 2010-11-23 VITALS — BP 108/72 | HR 78 | Temp 98.1°F | Ht 70.0 in | Wt 194.0 lb

## 2010-11-23 DIAGNOSIS — A4901 Methicillin susceptible Staphylococcus aureus infection, unspecified site: Secondary | ICD-10-CM

## 2010-11-23 DIAGNOSIS — L02519 Cutaneous abscess of unspecified hand: Secondary | ICD-10-CM

## 2010-11-23 DIAGNOSIS — M659 Synovitis and tenosynovitis, unspecified: Secondary | ICD-10-CM

## 2010-11-23 DIAGNOSIS — B950 Streptococcus, group A, as the cause of diseases classified elsewhere: Secondary | ICD-10-CM

## 2010-11-23 LAB — CBC WITH DIFFERENTIAL/PLATELET
Basophils Absolute: 0 10*3/uL (ref 0.0–0.1)
Basophils Relative: 0 % (ref 0–1)
Eosinophils Absolute: 0.1 10*3/uL (ref 0.0–0.7)
Hemoglobin: 14.4 g/dL (ref 13.0–17.0)
MCH: 31.7 pg (ref 26.0–34.0)
MCHC: 33.2 g/dL (ref 30.0–36.0)
Monocytes Relative: 6 % (ref 3–12)
Neutrophils Relative %: 76 % (ref 43–77)
Platelets: 202 10*3/uL (ref 150–400)
RDW: 13.7 % (ref 11.5–15.5)

## 2010-11-23 LAB — BASIC METABOLIC PANEL
Calcium: 9.5 mg/dL (ref 8.4–10.5)
Glucose, Bld: 98 mg/dL (ref 70–99)
Sodium: 142 mEq/L (ref 135–145)

## 2010-11-23 LAB — C-REACTIVE PROTEIN: CRP: 0.76 mg/dL — ABNORMAL HIGH (ref <0.6)

## 2010-11-23 NOTE — Assessment & Plan Note (Signed)
Culprit for hand infection

## 2010-11-23 NOTE — Progress Notes (Signed)
Subjective:    [name], male    DOB: 06-Aug-1930, 77 y.o.   MRN: [id number]  HPI  [name] is a 77 year old Caucasian male with rheumatoid arthritis and infectious disease complications including prior methicillin-sensitive  Staphylococcus aureus epidural abscess, and ankle septic arthritis, t reated with cefazolin, then history of right wrist and hand abscess  again with methicillin-sensitive Staphylococcus aureus, treated with cefazolin and followed by my partner, Dr. Ninetta Lights. The patient had been doing relatively well until the Friday prior to admission when developed  erythema involving his finger this progressed and erythema extended up into his arm and elbow. He sought care with his wife at a local hospital in [facility], but when they were discouraged, ultimately 0brought the patient to [facility] on the 8th. At that time, he had extensive erythema of his right upper extremity involving the right middle finger with 2 enter points of middle finger, posterior aspect of elbow as well and erythema extending from the hand all the way the lower third of the arm posteriorly. He was admitted to the hospitalist service and Hand Surgery was consulted. The patient was taken to the  operating room where he had a right long finger incision and debridement of the flexor tendon sheath and right debridement of the skin and subcutaneous tissues and drainage of an abscess. This was done on the 8th. He was placed on vancomycin and Zosyn on the 7th and then changed to vancomycin and cefepime postoperatively. These antibiotics were continued until his culture came back with pure group A Streptococci and he was changed to Rocephin. I saw him in the hospital in April and continued him on intravenous Rocephin. He was discharged on this and comple more than a month with systemic intravenous antibiotics. I saw him in clinic in May and we changed him to high-dose oral amoxicillin. In the interim  however he had inability to flex his long finger and was found to have additional tendon ruptures. In May underwent the following surgery 1. Right long finger tenosynovectomy flexor digitorum superficialis and flexor digitorum profundus. 2. Right long finger excision of the flexor digitorum superficialis and the palm. 3. Repair at flexor digitorum profundus zone to flexor tendon repair. He continued on oral amoxicillin. The patient's wife called me due to concerns about the patient having persistent drainage from his hand. Patient was being followed closely by Dr Orlan Leavens and apparently according to the patient's wife there is concern for need for reexploration of the surgical site. Certainly if he is failing affective antibiotics that would be my principal concern. I did change him from amoxicillin to Augmentin to broaden his coverage to get better anaerobic coverage in case there is a smoldering group A strep with anaerobic pathogens playing a role in this. Since being changed to Augmentin there had beendecreased amount of discharge. He was seen by Dr. [name] for planned surgical exploration of wrist but when Dr. Orlan Leavens examined the patient in the OR he was impressed by improved appearance. I saw pt last month and felt he could come off abx but Dr. [name] had wisehd to have him on for a month and then reexamien off abx 6 wks later. He returns for followup. His hand continues to heal up. He cannot get motion from middle finger.   Review of Systems  Constitutional: Negative for fever, chills, diaphoresis, activity change, appetite change, fatigue and unexpected weight change.  HENT: Negative for congestion, sore throat, rhinorrhea, sneezing, trouble swallowing  and sinus pressure.   Eyes: Negative for photophobia and visual disturbance.  Respiratory: Negative for cough, chest tightness, shortness of breath, wheezing and stridor.   Cardiovascular: Negative for chest pain, palpitations and leg swelling.    Gastrointestinal: Negative for nausea, vomiting, abdominal pain, diarrhea, constipation, blood in stool, abdominal distention and anal bleeding.  Genitourinary: Negative for dysuria, hematuria, flank pain and difficulty urinating.  Musculoskeletal: Negative for myalgias, back pain, joint swelling, arthralgias and gait problem.  Skin: Negative for color change, pallor, rash and wound.  Neurological: Negative for dizziness, tremors, weakness and light-headedness.  Hematological: Negative for adenopathy. Does not bruise/bleed easily.  Psychiatric/Behavioral: Negative for behavioral problems, confusion, sleep disturbance, dysphoric mood, decreased concentration and agitation.       Objective:   Physical Exam  Constitutional: He is oriented to person, place, and time. He appears well-developed and well-nourished. No distress.  HENT:  Head: Normocephalic and atraumatic.  Mouth/Throat: Oropharynx is clear and moist. No oropharyngeal exudate.  Eyes: Conjunctivae and EOM are normal. Pupils are equal, round, and reactive to light. No scleral icterus.  Neck: Normal range of motion. Neck supple. No JVD present.  Cardiovascular: Normal rate, regular rhythm and normal heart sounds.  Exam reveals no gallop and no friction rub.   No murmur heard. Pulmonary/Chest: Effort normal and breath sounds normal. No respiratory distress. He has no wheezes. He has no rales. He exhibits no tenderness.  Abdominal: He exhibits no distension and no mass. There is no tenderness. There is no rebound and no guarding.  Musculoskeletal: He exhibits no edema and no tenderness.  Lymphadenopathy:    He has no cervical adenopathy.  Neurological: He is alert and oriented to person, place, and time. He has normal reflexes. He exhibits normal muscle tone. Coordination normal.  Skin: Skin is warm and dry. He is not diaphoretic. No erythema. No pallor.          Can still barely see a bit of tendon at surface no purluence of  drainage or abscess  Psychiatric: He has a normal mood and affect. His behavior is normal. Judgment and thought content normal.          Assessment & Plan:

## 2010-11-23 NOTE — Assessment & Plan Note (Signed)
See above discussion

## 2010-11-23 NOTE — Assessment & Plan Note (Signed)
I am OK to continue the augmentin as Dr. Orlan Leavens had wished. I assume he remains concerned for deeper infection of tendon. He will reexamine pt 2 wks after stopping abx. Pt can return to see me pRN and certainly if he has infection that requires further I and D would want to be involved in care

## 2010-11-24 LAB — SEDIMENTATION RATE: Sed Rate: 1 mm/hr (ref 0–16)

## 2011-02-21 ENCOUNTER — Encounter (INDEPENDENT_AMBULATORY_CARE_PROVIDER_SITE_OTHER): Payer: Medicare Other | Admitting: Vascular Surgery

## 2011-02-21 DIAGNOSIS — I714 Abdominal aortic aneurysm, without rupture: Secondary | ICD-10-CM

## 2011-02-27 NOTE — Procedures (Unsigned)
DUPLEX ULTRASOUND OF ABDOMINAL AORTA  INDICATION:  Abdominal aortic aneurysm  HISTORY: Diabetes:  No Cardiac:  CAD, PTCA, stent, MI 1999 Hypertension:  Yes Smoking:  Previous Connective Tissue Disorder: Family History:  No Previous Surgery:  No  DUPLEX EXAM:         AP (cm)                   TRANSVERSE (cm) Proximal             2.57 cm                   2.68 cm Mid                  4.53 cm                   4.49 cm Distal               3.12 cm                   3.23 cm Right Iliac          1.09 cm                   0.98 cm Left Iliac           1.18 cm                   0.98 cm  PREVIOUS:  Date:  02/22/2010  AP:  3.84  TRANSVERSE:  3.95  IMPRESSION: 1. Infrarenal abdominal aortic aneurysm present measuring 4.53 cm x     4.49 cm with intramural thrombus noted. 2. Increase in diameter since previous study on 02/22/2010.  ___________________________________________ Quita Skye. Hart Rochester, M.D.  SH/MEDQ  D:  02/21/2011  T:  02/21/2011  Job:  [id number]

## 2011-05-26 DIAGNOSIS — E7800 Pure hypercholesterolemia, unspecified: Secondary | ICD-10-CM | POA: Diagnosis not present

## 2011-05-26 DIAGNOSIS — Z79899 Other long term (current) drug therapy: Secondary | ICD-10-CM | POA: Diagnosis not present

## 2011-07-26 DIAGNOSIS — I10 Essential (primary) hypertension: Secondary | ICD-10-CM | POA: Diagnosis not present

## 2011-07-26 DIAGNOSIS — E782 Mixed hyperlipidemia: Secondary | ICD-10-CM | POA: Diagnosis not present

## 2011-07-26 DIAGNOSIS — M2550 Pain in unspecified joint: Secondary | ICD-10-CM | POA: Diagnosis not present

## 2011-07-26 DIAGNOSIS — M1990 Unspecified osteoarthritis, unspecified site: Secondary | ICD-10-CM | POA: Diagnosis not present

## 2011-07-26 DIAGNOSIS — Z79899 Other long term (current) drug therapy: Secondary | ICD-10-CM | POA: Diagnosis not present

## 2011-07-26 DIAGNOSIS — M069 Rheumatoid arthritis, unspecified: Secondary | ICD-10-CM | POA: Diagnosis not present

## 2011-09-07 DIAGNOSIS — H25019 Cortical age-related cataract, unspecified eye: Secondary | ICD-10-CM | POA: Diagnosis not present

## 2011-09-07 DIAGNOSIS — H353190 Nonexudative age-related macular degeneration, unspecified eye, stage unspecified: Secondary | ICD-10-CM | POA: Diagnosis not present

## 2011-09-07 DIAGNOSIS — H2510 Age-related nuclear cataract, unspecified eye: Secondary | ICD-10-CM | POA: Diagnosis not present

## 2011-09-07 DIAGNOSIS — H11159 Pinguecula, unspecified eye: Secondary | ICD-10-CM | POA: Diagnosis not present

## 2011-10-20 DIAGNOSIS — H2510 Age-related nuclear cataract, unspecified eye: Secondary | ICD-10-CM | POA: Diagnosis not present

## 2011-11-21 DIAGNOSIS — M069 Rheumatoid arthritis, unspecified: Secondary | ICD-10-CM | POA: Diagnosis not present

## 2011-12-04 DIAGNOSIS — H2510 Age-related nuclear cataract, unspecified eye: Secondary | ICD-10-CM | POA: Diagnosis not present

## 2011-12-04 DIAGNOSIS — H269 Unspecified cataract: Secondary | ICD-10-CM | POA: Diagnosis not present

## 2011-12-05 DIAGNOSIS — H2510 Age-related nuclear cataract, unspecified eye: Secondary | ICD-10-CM | POA: Diagnosis not present

## 2011-12-18 DIAGNOSIS — H2510 Age-related nuclear cataract, unspecified eye: Secondary | ICD-10-CM | POA: Diagnosis not present

## 2011-12-18 DIAGNOSIS — H269 Unspecified cataract: Secondary | ICD-10-CM | POA: Diagnosis not present

## 2011-12-26 DIAGNOSIS — I359 Nonrheumatic aortic valve disorder, unspecified: Secondary | ICD-10-CM | POA: Diagnosis not present

## 2011-12-26 DIAGNOSIS — I2510 Atherosclerotic heart disease of native coronary artery without angina pectoris: Secondary | ICD-10-CM | POA: Diagnosis not present

## 2011-12-26 DIAGNOSIS — I10 Essential (primary) hypertension: Secondary | ICD-10-CM | POA: Diagnosis not present

## 2012-01-29 DIAGNOSIS — Z23 Encounter for immunization: Secondary | ICD-10-CM | POA: Diagnosis not present

## 2012-02-26 DIAGNOSIS — M545 Low back pain: Secondary | ICD-10-CM | POA: Diagnosis not present

## 2012-02-26 DIAGNOSIS — Z1331 Encounter for screening for depression: Secondary | ICD-10-CM | POA: Diagnosis not present

## 2012-02-26 DIAGNOSIS — Z79899 Other long term (current) drug therapy: Secondary | ICD-10-CM | POA: Diagnosis not present

## 2012-02-26 DIAGNOSIS — M069 Rheumatoid arthritis, unspecified: Secondary | ICD-10-CM | POA: Diagnosis not present

## 2012-02-26 DIAGNOSIS — Z0000 Encounter for general adult medical examination without abnormal findings: Secondary | ICD-10-CM | POA: Diagnosis not present

## 2012-02-26 DIAGNOSIS — M1990 Unspecified osteoarthritis, unspecified site: Secondary | ICD-10-CM | POA: Diagnosis not present

## 2012-02-26 DIAGNOSIS — E782 Mixed hyperlipidemia: Secondary | ICD-10-CM | POA: Diagnosis not present

## 2012-02-26 DIAGNOSIS — M2550 Pain in unspecified joint: Secondary | ICD-10-CM | POA: Diagnosis not present

## 2012-03-15 DIAGNOSIS — M069 Rheumatoid arthritis, unspecified: Secondary | ICD-10-CM | POA: Diagnosis not present

## 2012-03-21 ENCOUNTER — Encounter: Payer: Self-pay | Admitting: Vascular Surgery

## 2012-04-05 ENCOUNTER — Other Ambulatory Visit: Payer: Self-pay | Admitting: *Deleted

## 2012-04-05 DIAGNOSIS — I714 Abdominal aortic aneurysm, without rupture: Secondary | ICD-10-CM

## 2012-04-16 ENCOUNTER — Encounter: Payer: Self-pay | Admitting: Neurosurgery

## 2012-04-17 ENCOUNTER — Ambulatory Visit: Payer: Medicare Other | Admitting: Neurosurgery

## 2012-04-23 ENCOUNTER — Encounter: Payer: Self-pay | Admitting: Neurosurgery

## 2012-04-24 ENCOUNTER — Ambulatory Visit: Payer: Medicare Other | Admitting: Neurosurgery

## 2012-04-30 ENCOUNTER — Encounter: Payer: Self-pay | Admitting: Neurosurgery

## 2012-05-01 ENCOUNTER — Encounter: Payer: Self-pay | Admitting: Neurosurgery

## 2012-05-01 ENCOUNTER — Encounter (INDEPENDENT_AMBULATORY_CARE_PROVIDER_SITE_OTHER): Payer: Medicare Other | Admitting: *Deleted

## 2012-05-01 ENCOUNTER — Ambulatory Visit (INDEPENDENT_AMBULATORY_CARE_PROVIDER_SITE_OTHER): Payer: Medicare Other | Admitting: Neurosurgery

## 2012-05-01 VITALS — BP 111/74 | HR 86 | Resp 16 | Ht 70.0 in | Wt 194.0 lb

## 2012-05-01 DIAGNOSIS — I714 Abdominal aortic aneurysm, without rupture: Secondary | ICD-10-CM

## 2012-05-01 NOTE — Progress Notes (Signed)
[facility] [place] AAA/Carotid Office Note  CC: AAA surveillance  Referring Physician: Hart Rochester  History of Present Illness: 79 year old male patient of Dr. Hart Rochester followed for known AAA. The patient denies any unusual abdominal or back pain. The patient denies any new medical diagnoses or recent surgery.  Past Medical History  Diagnosis Date  . AAA (abdominal aortic aneurysm)   . Arthritis   . Staph skin infection     Back  . Immune disorder   . Myocardial infarction 2000    ROS: [x]  Positive   [ ]  Denies    General: [ ]  Weight loss, [ ]  Fever, [ ]  chills Neurologic: [ ]  Dizziness, [ ]  Blackouts, [ ]  Seizure [ ]  Stroke, [ ]  "Mini stroke", [ ]  Slurred speech, [ ]  Temporary blindness; [ ]  weakness in arms or legs, [ ]  Hoarseness Cardiac: [ ]  Chest pain/pressure, [ ]  Shortness of breath at rest [ ]  Shortness of breath with exertion, [ ]  Atrial fibrillation or irregular heartbeat Vascular: [ ]  Pain in legs with walking, [ ]  Pain in legs at rest, [ ]  Pain in legs at night,  [ ]  Non-healing ulcer, [ ]  Blood clot in vein/DVT,   Pulmonary: [ ]  Home oxygen, [ ]  Productive cough, [ ]  Coughing up blood, [ ]  Asthma,  [ ]  Wheezing Musculoskeletal:  [ ]  Arthritis, [ ]  Low back pain, [ ]  Joint pain Hematologic: [ ]  Easy Bruising, [ ]  Anemia; [ ]  Hepatitis Gastrointestinal: [ ]  Blood in stool, [ ]  Gastroesophageal Reflux/heartburn, [ ]  Trouble swallowing Urinary: [ ]  chronic Kidney disease, [ ]  on HD - [ ]  MWF or [ ]  TTHS, [ ]  Burning with urination, [ ]  Difficulty urinating Skin: [ ]  Rashes, [ ]  Wounds Psychological: [ ]  Anxiety, [ ]  Depression   Social History History  Substance Use Topics  . Smoking status: Former Smoker    Quit date: 03/27/1978  . Smokeless tobacco: Never Used  . Alcohol Use: Yes    Family History Family History  Problem Relation Age of Onset  . Heart disease Brother   . Stroke Brother   . Heart attack Brother     Allergies  Allergen  Reactions  . Morphine And Related Nausea And Vomiting    Current Outpatient Prescriptions  Medication Sig Dispense Refill  . aspirin 81 MG chewable tablet Chew 81 mg by mouth daily.        Marland Kitchen atorvastatin (LIPITOR) 80 MG tablet Take 80 mg by mouth daily.        . calcium carbonate (OS-CAL) 600 MG TABS Take 600 mg by mouth 2 (two) times daily with a meal.        . diclofenac sodium (VOLTAREN) 1 % GEL Apply 1 application topically.        Marland Kitchen ezetimibe (ZETIA) 10 MG tablet Take 10 mg by mouth daily.        . hydroxychloroquine (PLAQUENIL) 200 MG tablet Take 200 mg by mouth daily.        . metoprolol tartrate (LOPRESSOR) 12.5 mg TABS Take 25 mg by mouth 2 (two) times daily.        . Multiple Vitamin (MULTIVITAMIN) capsule Take 1 capsule by mouth daily.        Marland Kitchen omeprazole (PRILOSEC) 20 MG capsule Take 20 mg by mouth daily.        . predniSONE (DELTASONE) 5 MG tablet Take 5 mg by mouth daily.        Marland Kitchen  ramipril (ALTACE) 2.5 MG tablet Take 2.5 mg by mouth daily.        . Tamsulosin HCl (FLOMAX) 0.4 MG CAPS Take by mouth.        Marland Kitchen amoxicillin-clavulanate (AUGMENTIN) 875-125 MG per tablet Take 1 tablet by mouth 2 (two) times daily.  60 tablet  3  . meloxicam (MOBIC) 15 MG tablet Take 15 mg by mouth daily.          Physical Examination  Filed Vitals:   05/01/12 0941  BP: 111/74  Pulse: 86  Resp: 16    Body mass index is 27.84 kg/(m^2).  General:  WDWN in NAD Gait: Normal HEENT: WNL Eyes: Pupils equal Pulmonary: normal non-labored breathing , without Rales, rhonchi,  wheezing Cardiac: RRR, without  Murmurs, rubs or gallops; Abdomen: soft, NT, no masses Skin: no rashes, ulcers noted  Vascular Exam Pulses: 3+ radial pulses bilaterally Carotid bruits: Carotid pulses to auscultation no bruits are heard, no pulsatile abdominal mass is palpated due to girth Extremities without ischemic changes, no Gangrene , no cellulitis; no open wounds;  Musculoskeletal: no muscle wasting or  atrophy   Neurologic: A&O X 3; Appropriate Affect ; SENSATION: normal; MOTOR FUNCTION:  moving all extremities equally. Speech is fluent/normal  Non-Invasive Vascular Imaging AAA duplex shows a maximum diameter of 4.3 x 4.2 which is diminished from previous exam in November 2012 when he was 4.5 x 4.4  ASSESSMENT/PLAN: Asymptomatic patient will followup in one year with repeat blade duplex. The patient's questions were encouraged and answered, he is in agreement with this plan. The patient understands signs and symptoms of rupture and knows to call 911 should this occur.  Lauree Chandler ANP   Clinic MD: Edilia Bo

## 2012-05-03 ENCOUNTER — Other Ambulatory Visit: Payer: Self-pay | Admitting: *Deleted

## 2012-05-03 DIAGNOSIS — I714 Abdominal aortic aneurysm, without rupture, unspecified: Secondary | ICD-10-CM

## 2012-08-09 DIAGNOSIS — H3530 Unspecified macular degeneration: Secondary | ICD-10-CM | POA: Diagnosis not present

## 2012-08-09 DIAGNOSIS — H04129 Dry eye syndrome of unspecified lacrimal gland: Secondary | ICD-10-CM | POA: Diagnosis not present

## 2012-08-09 DIAGNOSIS — H40009 Preglaucoma, unspecified, unspecified eye: Secondary | ICD-10-CM | POA: Diagnosis not present

## 2012-08-09 DIAGNOSIS — H01009 Unspecified blepharitis unspecified eye, unspecified eyelid: Secondary | ICD-10-CM | POA: Diagnosis not present

## 2012-08-13 DIAGNOSIS — Z79899 Other long term (current) drug therapy: Secondary | ICD-10-CM | POA: Diagnosis not present

## 2012-08-13 DIAGNOSIS — M2550 Pain in unspecified joint: Secondary | ICD-10-CM | POA: Diagnosis not present

## 2012-08-13 DIAGNOSIS — M069 Rheumatoid arthritis, unspecified: Secondary | ICD-10-CM | POA: Diagnosis not present

## 2012-08-26 DIAGNOSIS — M25569 Pain in unspecified knee: Secondary | ICD-10-CM | POA: Diagnosis not present

## 2012-08-26 DIAGNOSIS — I714 Abdominal aortic aneurysm, without rupture: Secondary | ICD-10-CM | POA: Diagnosis not present

## 2012-08-26 DIAGNOSIS — I10 Essential (primary) hypertension: Secondary | ICD-10-CM | POA: Diagnosis not present

## 2012-08-29 DIAGNOSIS — M25559 Pain in unspecified hip: Secondary | ICD-10-CM | POA: Diagnosis not present

## 2012-08-29 DIAGNOSIS — M1710 Unilateral primary osteoarthritis, unspecified knee: Secondary | ICD-10-CM | POA: Diagnosis not present

## 2012-09-05 DIAGNOSIS — M1710 Unilateral primary osteoarthritis, unspecified knee: Secondary | ICD-10-CM | POA: Diagnosis not present

## 2012-09-09 DIAGNOSIS — M069 Rheumatoid arthritis, unspecified: Secondary | ICD-10-CM | POA: Diagnosis not present

## 2012-09-12 DIAGNOSIS — M1710 Unilateral primary osteoarthritis, unspecified knee: Secondary | ICD-10-CM | POA: Diagnosis not present

## 2012-09-24 DIAGNOSIS — M1710 Unilateral primary osteoarthritis, unspecified knee: Secondary | ICD-10-CM | POA: Diagnosis not present

## 2012-10-01 DIAGNOSIS — M1710 Unilateral primary osteoarthritis, unspecified knee: Secondary | ICD-10-CM | POA: Diagnosis not present

## 2012-10-14 ENCOUNTER — Encounter (INDEPENDENT_AMBULATORY_CARE_PROVIDER_SITE_OTHER): Payer: Medicare Other | Admitting: Ophthalmology

## 2012-10-14 DIAGNOSIS — H43819 Vitreous degeneration, unspecified eye: Secondary | ICD-10-CM | POA: Diagnosis not present

## 2012-10-14 DIAGNOSIS — H35039 Hypertensive retinopathy, unspecified eye: Secondary | ICD-10-CM

## 2012-10-14 DIAGNOSIS — H3530 Unspecified macular degeneration: Secondary | ICD-10-CM | POA: Diagnosis not present

## 2012-10-14 DIAGNOSIS — I10 Essential (primary) hypertension: Secondary | ICD-10-CM | POA: Diagnosis not present

## 2012-10-24 DIAGNOSIS — M2550 Pain in unspecified joint: Secondary | ICD-10-CM | POA: Diagnosis not present

## 2012-10-24 DIAGNOSIS — Z79899 Other long term (current) drug therapy: Secondary | ICD-10-CM | POA: Diagnosis not present

## 2012-10-24 DIAGNOSIS — M069 Rheumatoid arthritis, unspecified: Secondary | ICD-10-CM | POA: Diagnosis not present

## 2012-10-24 DIAGNOSIS — R634 Abnormal weight loss: Secondary | ICD-10-CM | POA: Diagnosis not present

## 2012-10-29 DIAGNOSIS — M069 Rheumatoid arthritis, unspecified: Secondary | ICD-10-CM | POA: Diagnosis not present

## 2012-10-29 DIAGNOSIS — M1710 Unilateral primary osteoarthritis, unspecified knee: Secondary | ICD-10-CM | POA: Diagnosis not present

## 2012-12-11 DIAGNOSIS — M1710 Unilateral primary osteoarthritis, unspecified knee: Secondary | ICD-10-CM | POA: Diagnosis not present

## 2012-12-16 DIAGNOSIS — M1990 Unspecified osteoarthritis, unspecified site: Secondary | ICD-10-CM | POA: Diagnosis not present

## 2012-12-16 DIAGNOSIS — I2510 Atherosclerotic heart disease of native coronary artery without angina pectoris: Secondary | ICD-10-CM | POA: Diagnosis not present

## 2012-12-16 DIAGNOSIS — I359 Nonrheumatic aortic valve disorder, unspecified: Secondary | ICD-10-CM | POA: Diagnosis not present

## 2012-12-16 DIAGNOSIS — I10 Essential (primary) hypertension: Secondary | ICD-10-CM | POA: Diagnosis not present

## 2012-12-16 DIAGNOSIS — Z01810 Encounter for preprocedural cardiovascular examination: Secondary | ICD-10-CM | POA: Diagnosis not present

## 2012-12-16 DIAGNOSIS — I714 Abdominal aortic aneurysm, without rupture: Secondary | ICD-10-CM | POA: Diagnosis not present

## 2012-12-24 DIAGNOSIS — Z87891 Personal history of nicotine dependence: Secondary | ICD-10-CM | POA: Diagnosis not present

## 2012-12-24 DIAGNOSIS — Z79899 Other long term (current) drug therapy: Secondary | ICD-10-CM | POA: Diagnosis not present

## 2012-12-24 DIAGNOSIS — H35369 Drusen (degenerative) of macula, unspecified eye: Secondary | ICD-10-CM | POA: Diagnosis not present

## 2012-12-24 DIAGNOSIS — Z8739 Personal history of other diseases of the musculoskeletal system and connective tissue: Secondary | ICD-10-CM | POA: Diagnosis not present

## 2012-12-24 DIAGNOSIS — Z961 Presence of intraocular lens: Secondary | ICD-10-CM | POA: Diagnosis not present

## 2012-12-24 DIAGNOSIS — I10 Essential (primary) hypertension: Secondary | ICD-10-CM | POA: Diagnosis not present

## 2012-12-24 DIAGNOSIS — H02109 Unspecified ectropion of unspecified eye, unspecified eyelid: Secondary | ICD-10-CM | POA: Diagnosis not present

## 2012-12-24 DIAGNOSIS — M069 Rheumatoid arthritis, unspecified: Secondary | ICD-10-CM | POA: Diagnosis not present

## 2012-12-24 DIAGNOSIS — Z7982 Long term (current) use of aspirin: Secondary | ICD-10-CM | POA: Diagnosis not present

## 2012-12-24 DIAGNOSIS — Z9889 Other specified postprocedural states: Secondary | ICD-10-CM | POA: Diagnosis not present

## 2012-12-24 DIAGNOSIS — IMO0002 Reserved for concepts with insufficient information to code with codable children: Secondary | ICD-10-CM | POA: Diagnosis not present

## 2012-12-25 ENCOUNTER — Ambulatory Visit: Payer: Medicare Other | Admitting: Interventional Cardiology

## 2013-01-02 ENCOUNTER — Other Ambulatory Visit: Payer: Self-pay | Admitting: Orthopedic Surgery

## 2013-01-07 DIAGNOSIS — M069 Rheumatoid arthritis, unspecified: Secondary | ICD-10-CM | POA: Diagnosis not present

## 2013-01-07 DIAGNOSIS — M2550 Pain in unspecified joint: Secondary | ICD-10-CM | POA: Diagnosis not present

## 2013-01-07 DIAGNOSIS — Z23 Encounter for immunization: Secondary | ICD-10-CM | POA: Diagnosis not present

## 2013-01-07 DIAGNOSIS — Z79899 Other long term (current) drug therapy: Secondary | ICD-10-CM | POA: Diagnosis not present

## 2013-01-09 ENCOUNTER — Encounter (HOSPITAL_COMMUNITY): Payer: Self-pay | Admitting: Pharmacy Technician

## 2013-01-11 NOTE — Pre-Procedure Instructions (Signed)
[name]  01/11/2013   Your procedure is scheduled on:  October 27  Report to [facility] Entrance "A" [address] at 08:00 AM.  Call this number if you have problems the morning of surgery: [phone]   Remember:   Do not eat food or drink liquids after midnight.   Take these medicines the morning of surgery with A SIP OF WATER: Metoprolol, Omeprazole, Prednisone, Flomax, Sulfasalazine, Eye drops   STOP Aspirin, Calcium, Multiple Vitamins, Eye Vitamins, today  Do not take Aspirin, Aleve, Naproxen, Advil, Ibuprofen, Vitamin, Herbs, or Supplements starting today   Do not wear jewelry, make-up or nail polish.  Do not wear lotions, powders, or perfumes. You may wear deodorant.  Do not shave 48 hours prior to surgery. Men may shave face and neck.  Do not bring valuables to the hospital.  [facility] is not responsible                  for any belongings or valuables.               Contacts, dentures or bridgework may not be worn into surgery.  Leave suitcase in the car. After surgery it may be brought to your room.  For patients admitted to the hospital, discharge time is determined by your                treatment team.               Special Instructions: Shower using CHG 2 nights before surgery and the night before surgery.  If you shower the day of surgery use CHG.  Use special wash - you have one bottle of CHG for all showers.  You should use approximately 1/3 of the bottle for each shower.   Please read over the following fact sheets that you were given: Pain Booklet, Coughing and Deep Breathing, Blood Transfusion Information and Surgical Site Infection Prevention

## 2013-01-13 ENCOUNTER — Encounter (HOSPITAL_COMMUNITY)
Admission: RE | Admit: 2013-01-13 | Discharge: 2013-01-13 | Disposition: A | Discharge status: Home / Self Care | Payer: Medicare Other | Source: Ambulatory Visit | Attending: Orthopedic Surgery | Admitting: Orthopedic Surgery

## 2013-01-13 ENCOUNTER — Ambulatory Visit (HOSPITAL_COMMUNITY)
Admission: RE | Admit: 2013-01-13 | Discharge: 2013-01-13 | Disposition: A | Discharge status: Home / Self Care | Payer: Medicare Other | Source: Ambulatory Visit | Attending: Orthopedic Surgery | Admitting: Orthopedic Surgery

## 2013-01-13 ENCOUNTER — Encounter (HOSPITAL_COMMUNITY): Payer: Self-pay

## 2013-01-13 DIAGNOSIS — I714 Abdominal aortic aneurysm, without rupture, unspecified: Secondary | ICD-10-CM | POA: Insufficient documentation

## 2013-01-13 DIAGNOSIS — M899 Disorder of bone, unspecified: Secondary | ICD-10-CM | POA: Diagnosis not present

## 2013-01-13 DIAGNOSIS — I10 Essential (primary) hypertension: Secondary | ICD-10-CM | POA: Diagnosis not present

## 2013-01-13 DIAGNOSIS — Z01818 Encounter for other preprocedural examination: Secondary | ICD-10-CM | POA: Diagnosis not present

## 2013-01-13 DIAGNOSIS — M1710 Unilateral primary osteoarthritis, unspecified knee: Secondary | ICD-10-CM | POA: Diagnosis not present

## 2013-01-13 DIAGNOSIS — Z01812 Encounter for preprocedural laboratory examination: Secondary | ICD-10-CM | POA: Insufficient documentation

## 2013-01-13 DIAGNOSIS — R918 Other nonspecific abnormal finding of lung field: Secondary | ICD-10-CM | POA: Diagnosis not present

## 2013-01-13 DIAGNOSIS — I252 Old myocardial infarction: Secondary | ICD-10-CM | POA: Diagnosis not present

## 2013-01-13 HISTORY — DX: Shortness of breath: R06.02

## 2013-01-13 HISTORY — DX: Essential (primary) hypertension: I10

## 2013-01-13 LAB — URINALYSIS, ROUTINE W REFLEX MICROSCOPIC
Glucose, UA: NEGATIVE mg/dL
Hgb urine dipstick: NEGATIVE
Ketones, ur: 15 mg/dL — AB
Nitrite: NEGATIVE
Protein, ur: NEGATIVE mg/dL
Specific Gravity, Urine: 1.028 (ref 1.005–1.030)
pH: 6 (ref 5.0–8.0)

## 2013-01-13 LAB — TYPE AND SCREEN: ABO/RH(D): O NEG

## 2013-01-13 LAB — BASIC METABOLIC PANEL
BUN: 14 mg/dL (ref 6–23)
CO2: 27 mEq/L (ref 19–32)
Chloride: 105 mEq/L (ref 96–112)
Creatinine, Ser: 0.75 mg/dL (ref 0.50–1.35)
GFR calc Af Amer: >90 mL/min (ref >90)
GFR calc non Af Amer: 84 mL/min — ABNORMAL LOW (ref >90)
Potassium: 4.7 mEq/L (ref 3.5–5.1)
Sodium: 141 mEq/L (ref 135–145)

## 2013-01-13 LAB — CBC WITH DIFFERENTIAL/PLATELET
Basophils Absolute: 0 10*3/uL (ref 0.0–0.1)
Basophils Relative: 0 % (ref 0–1)
HCT: 40.2 % (ref 39.0–52.0)
MCHC: 33.6 g/dL (ref 30.0–36.0)
Neutro Abs: 5.3 10*3/uL (ref 1.7–7.7)
Neutrophils Relative %: 71 % (ref 43–77)
RBC: 4.14 MIL/uL — ABNORMAL LOW (ref 4.22–5.81)
RDW: 13.1 % (ref 11.5–15.5)

## 2013-01-13 LAB — ABO/RH: ABO/RH(D): O NEG

## 2013-01-13 LAB — SURGICAL PCR SCREEN: MRSA, PCR: NEGATIVE

## 2013-01-13 LAB — PROTIME-INR: INR: 1.06 (ref 0.00–1.49)

## 2013-01-13 LAB — APTT: aPTT: 28 seconds (ref 24–37)

## 2013-01-13 NOTE — Progress Notes (Signed)
Dr Verdis Prime called for ekg,stress test and cardiac clearance.

## 2013-01-14 NOTE — Progress Notes (Signed)
Anesthesia chart review:  Patient is a 79 year old male scheduled for right TKA on 01/20/13 by Dr. Rennis Chris. History includes former smoker, obesity, MI s/p mid RCA BMS '01, HTN, rheumatoid arthritis, AAA 4.3 CM by duplex 04/2012 (Dr. Hart Rochester), MSSA epidural abscess, right hand and wrist abscess, and ankle septic arthritis '12. PCP is Dr. Merlene Laughter.  Cardiologist is Dr. Verdis Prime who saw patient for a preoperative evaluation on 12/16/12.  He was cleared with intermediate risk.  EKG on 12/16/12 showed NSR, old inferior infarct.  Echo on 01/04/09 showed LVEF 45-50%, mid inferior wall hypokinesis, moderate LAE, AV sclerosis with reduced excursion.  No AS/AR.  Mild MR. Findings suggestive of grade 1 diastolic dysfunction without elevated LA pressure.  His last stress test was in 2007. Last cath was in 2001.  CXR on 01/13/13 showed: Mild dextroscoliosis thoracic spine again noted. Stable chronic interstitial prominence and peripheral probable fibrotic changes especially lung bases and lower lobes. No definite superimposed infiltrate or pulmonary edema. Osteopenia and mild degenerative changes thoracic spine.  Preoperative labs noted.  If no acute changes then I would anticipate that he could proceed as planned.  Velna Ochs [facility] Short Stay Center/Anesthesiology Phone [phone] 01/14/2013 11:04 AM

## 2013-01-17 NOTE — Progress Notes (Signed)
Patient notified of new arrival time of 07:00 

## 2013-01-17 NOTE — H&P (Signed)
TOTAL KNEE ADMISSION H&P  Patient is being admitted for right total knee arthroplasty.  Subjective:  Chief Complaint:right knee pain.  HPI: [name], 79 y.o. male, has a history of pain and functional disability in the right knee due to arthritis and has failed non-surgical conservative treatments for greater than 12 weeks to includeNSAID's and/or analgesics, corticosteriod injections and viscosupplementation injections.  Onset of symptoms was gradual, starting 8 years ago with gradually worsening course since that time. The patient noted no past surgery on the right knee(s).  Patient currently rates pain in the right knee(s) at 10 out of 10 with activity. Patient has night pain, worsening of pain with activity and weight bearing, pain that interferes with activities of daily living and pain with passive range of motion.  Patient has evidence of joint space narrowing by imaging studies.  There is no active infection.  Patient Active Problem List   Diagnosis Date Noted  . Abdominal aneurysm without mention of rupture 05/01/2012  . Hand abscess 08/02/2010  . Tenosynovitis 08/02/2010  . 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 MSSA (methicillin susceptible Staphylococcus aureus) infection 08/02/2010  . Group A streptococcal infection 08/02/2010   Past Medical History  Diagnosis Date  . AAA (abdominal aortic aneurysm)   . Staph skin infection     Back  . Immune disorder   . Myocardial infarction 2000  . Shortness of breath   . Arthritis     ra  . Hypertension     dr Verdis Prime    Past Surgical History  Procedure Laterality Date  . Stents  2000  . Joint replacement      bil hips  . Back surgery    . Hand surgery      rel to staph  . Eye surgery      No prescriptions prior to admission   Allergies  Allergen Reactions  . Morphine And Related Nausea And Vomiting and Other (See Comments)    Hallucinations     History  Substance Use Topics  . Smoking status: Former Smoker   Quit date: 03/27/1978  . Smokeless tobacco: Never Used  . Alcohol Use: Yes    Family History  Problem Relation Age of Onset  . Heart disease Brother   . Stroke Brother   . Heart attack Brother      Review of Systems  Constitutional: Negative.   HENT: Negative.   Eyes: Negative.   Respiratory: Negative.   Cardiovascular: Negative.   Gastrointestinal: Negative.   Genitourinary: Negative.   Musculoskeletal: Positive for joint pain.  Skin: Negative.   Neurological: Negative.   Endo/Heme/Allergies: Negative.   Psychiatric/Behavioral: Negative.     Objective:  Physical Exam  Constitutional: He is oriented to person, place, and time. He appears well-developed and well-nourished.  HENT:  Head: Normocephalic and atraumatic.  Eyes: Pupils are equal, round, and reactive to light.  Neck: Normal range of motion. Neck supple.  Cardiovascular: Intact distal pulses.   Respiratory: Effort normal.  Musculoskeletal: He exhibits tenderness.  Neurological: He is alert and oriented to person, place, and time.  Skin: Skin is warm and dry.  Psychiatric: He has a normal mood and affect. His behavior is normal. Judgment and thought content normal.    Vital signs in last 24 hours:    Labs:   Estimated body mass index is 27.84 kg/(m^2) as calculated from the following:   Height as of 05/01/12: 5\' 10"  (1.778 m).   Weight as of 05/01/12: 87.998 kg (194  lb).   Imaging Review Plain radiographs demonstrate severe degenerative joint disease of the right knee(s). The overall alignment issignificant valgus. The bone quality appears to be good for age and reported activity level.  Assessment/Plan:  End stage arthritis, right knee   The patient history, physical examination, clinical judgment of the provider and imaging studies are consistent with end stage degenerative joint disease of the right knee(s) and total knee arthroplasty is deemed medically necessary. The treatment options including  medical management, injection therapy arthroscopy and arthroplasty were discussed at length. The risks and benefits of total knee arthroplasty were presented and reviewed. The risks due to aseptic loosening, infection, stiffness, patella tracking problems, thromboembolic complications and other imponderables were discussed. The patient acknowledged the explanation, agreed to proceed with the plan and consent was signed. Patient is being admitted for inpatient treatment for surgery, pain control, PT, OT, prophylactic antibiotics, VTE prophylaxis, progressive ambulation and ADL's and discharge planning. The patient is planning to be discharged to skilled nursing facility

## 2013-01-19 MED ORDER — CHLORHEXIDINE GLUCONATE 4 % EX LIQD: 60.0000 mL | Freq: Once | CUTANEOUS | Status: DC

## 2013-01-19 MED ORDER — DEXTROSE-NACL 5-0.45 % IV SOLN: INTRAVENOUS | Status: DC

## 2013-01-19 MED ORDER — CEFAZOLIN SODIUM-DEXTROSE 2-3 GM-% IV SOLR
2.0000 g | INTRAVENOUS | Status: AC
  Administered 2013-01-20: 2 g via INTRAVENOUS

## 2013-01-20 ENCOUNTER — Inpatient Hospital Stay (HOSPITAL_COMMUNITY)
Admission: RE | Admit: 2013-01-20 | Discharge: 2013-01-23 | DRG: 470 | Disposition: A | Discharge status: Skilled Nursing Facility | Payer: Medicare Other | Source: Ambulatory Visit | Attending: Orthopedic Surgery | Admitting: Orthopedic Surgery

## 2013-01-20 ENCOUNTER — Inpatient Hospital Stay (HOSPITAL_COMMUNITY): Payer: Medicare Other | Admitting: Anesthesiology

## 2013-01-20 ENCOUNTER — Encounter (HOSPITAL_COMMUNITY): Payer: Self-pay | Admitting: *Deleted

## 2013-01-20 ENCOUNTER — Encounter (HOSPITAL_COMMUNITY)
Admission: RE | Disposition: A | Discharge status: Skilled Nursing Facility | Payer: Self-pay | Source: Ambulatory Visit | Attending: Orthopedic Surgery

## 2013-01-20 ENCOUNTER — Encounter (HOSPITAL_COMMUNITY): Payer: Medicare Other | Admitting: Vascular Surgery

## 2013-01-20 DIAGNOSIS — Z9861 Coronary angioplasty status: Secondary | ICD-10-CM | POA: Diagnosis not present

## 2013-01-20 DIAGNOSIS — I2510 Atherosclerotic heart disease of native coronary artery without angina pectoris: Secondary | ICD-10-CM | POA: Diagnosis not present

## 2013-01-20 DIAGNOSIS — M1710 Unilateral primary osteoarthritis, unspecified knee: Secondary | ICD-10-CM | POA: Diagnosis not present

## 2013-01-20 DIAGNOSIS — K5730 Diverticulosis of large intestine without perforation or abscess without bleeding: Secondary | ICD-10-CM | POA: Diagnosis not present

## 2013-01-20 DIAGNOSIS — Z87891 Personal history of nicotine dependence: Secondary | ICD-10-CM | POA: Diagnosis not present

## 2013-01-20 DIAGNOSIS — G8918 Other acute postprocedural pain: Secondary | ICD-10-CM | POA: Diagnosis not present

## 2013-01-20 DIAGNOSIS — I714 Abdominal aortic aneurysm, without rupture: Secondary | ICD-10-CM | POA: Diagnosis not present

## 2013-01-20 DIAGNOSIS — S8990XA Unspecified injury of unspecified lower leg, initial encounter: Secondary | ICD-10-CM | POA: Diagnosis not present

## 2013-01-20 DIAGNOSIS — Z471 Aftercare following joint replacement surgery: Secondary | ICD-10-CM | POA: Diagnosis not present

## 2013-01-20 DIAGNOSIS — Z96659 Presence of unspecified artificial knee joint: Secondary | ICD-10-CM | POA: Diagnosis not present

## 2013-01-20 DIAGNOSIS — I10 Essential (primary) hypertension: Secondary | ICD-10-CM | POA: Diagnosis not present

## 2013-01-20 DIAGNOSIS — E785 Hyperlipidemia, unspecified: Secondary | ICD-10-CM | POA: Diagnosis not present

## 2013-01-20 DIAGNOSIS — M1711 Unilateral primary osteoarthritis, right knee: Secondary | ICD-10-CM | POA: Diagnosis present

## 2013-01-20 DIAGNOSIS — M25569 Pain in unspecified knee: Secondary | ICD-10-CM | POA: Diagnosis not present

## 2013-01-20 DIAGNOSIS — IMO0002 Reserved for concepts with insufficient information to code with codable children: Secondary | ICD-10-CM | POA: Diagnosis not present

## 2013-01-20 DIAGNOSIS — Z5189 Encounter for other specified aftercare: Secondary | ICD-10-CM | POA: Diagnosis not present

## 2013-01-20 DIAGNOSIS — N400 Enlarged prostate without lower urinary tract symptoms: Secondary | ICD-10-CM | POA: Diagnosis not present

## 2013-01-20 DIAGNOSIS — I252 Old myocardial infarction: Secondary | ICD-10-CM | POA: Diagnosis not present

## 2013-01-20 DIAGNOSIS — H9110 Presbycusis, unspecified ear: Secondary | ICD-10-CM | POA: Diagnosis not present

## 2013-01-20 HISTORY — PX: JOINT REPLACEMENT: SHX530

## 2013-01-20 HISTORY — PX: TOTAL KNEE ARTHROPLASTY: SHX125

## 2013-01-20 HISTORY — DX: Gross hematuria: R31.0

## 2013-01-20 SURGERY — ARTHROPLASTY, KNEE, TOTAL
Anesthesia: Regional | Site: Knee | Laterality: Right | Wound class: Clean

## 2013-01-20 MED ORDER — ASPIRIN EC 325 MG PO TBEC
325.0000 mg | DELAYED_RELEASE_TABLET | Freq: Every day | ORAL | Status: DC
  Administered 2013-01-21 – 2013-01-23 (×3): 325 mg via ORAL
  Filled 2013-01-20 (×5): qty 1

## 2013-01-20 MED ORDER — MAGNESIUM CITRATE PO SOLN: 1.0000 | Freq: Once | ORAL | Status: AC | PRN

## 2013-01-20 MED ORDER — PHENYLEPHRINE HCL 10 MG/ML IJ SOLN
INTRAMUSCULAR | Status: DC | PRN
  Administered 2013-01-20 (×5): 80 ug via INTRAVENOUS

## 2013-01-20 MED ORDER — NITROGLYCERIN 0.4 MG SL SUBL: 0.4000 mg | SUBLINGUAL_TABLET | SUBLINGUAL | Status: DC | PRN

## 2013-01-20 MED ORDER — CEFUROXIME SODIUM 1.5 G IJ SOLR
INTRAMUSCULAR | Status: AC
  Filled 2013-01-20: qty 1.5

## 2013-01-20 MED ORDER — METHOCARBAMOL 100 MG/ML IJ SOLN: 500.0000 mg | Freq: Four times a day (QID) | INTRAVENOUS | Status: DC | PRN

## 2013-01-20 MED ORDER — PROMETHAZINE HCL 25 MG/ML IJ SOLN
6.2500 mg | INTRAMUSCULAR | Status: DC | PRN
  Administered 2013-01-20: 6.25 mg via INTRAVENOUS

## 2013-01-20 MED ORDER — ALUM & MAG HYDROXIDE-SIMETH 200-200-20 MG/5ML PO SUSP: 30.0000 mL | ORAL | Status: DC | PRN

## 2013-01-20 MED ORDER — HYDROCORTISONE SOD SUCCINATE 100 MG IJ SOLR
INTRAMUSCULAR | Status: DC | PRN
  Administered 2013-01-20: 100 mg via INTRAVENOUS

## 2013-01-20 MED ORDER — DIPHENHYDRAMINE HCL 12.5 MG/5ML PO ELIX: 12.5000 mg | ORAL_SOLUTION | ORAL | Status: DC | PRN

## 2013-01-20 MED ORDER — PROPOFOL 10 MG/ML IV BOLUS
INTRAVENOUS | Status: DC | PRN
  Administered 2013-01-20: 100 mg via INTRAVENOUS

## 2013-01-20 MED ORDER — ONDANSETRON HCL 4 MG PO TABS: 4.0000 mg | ORAL_TABLET | Freq: Four times a day (QID) | ORAL | Status: DC | PRN

## 2013-01-20 MED ORDER — PREDNISONE 5 MG PO TABS
5.0000 mg | ORAL_TABLET | Freq: Every day | ORAL | Status: DC
  Administered 2013-01-21 – 2013-01-23 (×3): 5 mg via ORAL
  Filled 2013-01-20 (×3): qty 1

## 2013-01-20 MED ORDER — METHOCARBAMOL 500 MG PO TABS
500.0000 mg | ORAL_TABLET | Freq: Four times a day (QID) | ORAL | Status: DC | PRN
  Administered 2013-01-20 – 2013-01-22 (×4): 500 mg via ORAL
  Filled 2013-01-20 (×4): qty 1

## 2013-01-20 MED ORDER — ROCURONIUM BROMIDE 100 MG/10ML IV SOLN
INTRAVENOUS | Status: DC | PRN
  Administered 2013-01-20: 50 mg via INTRAVENOUS

## 2013-01-20 MED ORDER — RAMIPRIL 2.5 MG PO CAPS
2.5000 mg | ORAL_CAPSULE | Freq: Every day | ORAL | Status: DC
  Administered 2013-01-20 – 2013-01-23 (×4): 2.5 mg via ORAL
  Filled 2013-01-20 (×4): qty 1

## 2013-01-20 MED ORDER — TAMSULOSIN HCL 0.4 MG PO CAPS
0.4000 mg | ORAL_CAPSULE | Freq: Every day | ORAL | Status: DC
  Administered 2013-01-21 – 2013-01-23 (×3): 0.4 mg via ORAL
  Filled 2013-01-20 (×3): qty 1

## 2013-01-20 MED ORDER — FENTANYL CITRATE 0.05 MG/ML IJ SOLN
25.0000 ug | INTRAMUSCULAR | Status: DC | PRN
  Administered 2013-01-20: 25 ug via INTRAVENOUS

## 2013-01-20 MED ORDER — BUPIVACAINE LIPOSOME 1.3 % IJ SUSP
20.0000 mL | INTRAMUSCULAR | Status: DC
  Filled 2013-01-20: qty 20

## 2013-01-20 MED ORDER — ROPIVACAINE HCL 5 MG/ML IJ SOLN
INTRAMUSCULAR | Status: DC | PRN
  Administered 2013-01-20: 30 mL via EPIDURAL

## 2013-01-20 MED ORDER — GLYCOPYRROLATE 0.2 MG/ML IJ SOLN
INTRAMUSCULAR | Status: DC | PRN
  Administered 2013-01-20: 0.4 mg via INTRAVENOUS

## 2013-01-20 MED ORDER — DOCUSATE SODIUM 100 MG PO CAPS
100.0000 mg | ORAL_CAPSULE | Freq: Two times a day (BID) | ORAL | Status: DC
  Administered 2013-01-20 – 2013-01-23 (×7): 100 mg via ORAL
  Filled 2013-01-20 (×6): qty 1

## 2013-01-20 MED ORDER — HYDROMORPHONE HCL PF 1 MG/ML IJ SOLN
0.5000 mg | INTRAMUSCULAR | Status: DC | PRN
  Administered 2013-01-20 – 2013-01-21 (×3): 0.5 mg via INTRAVENOUS
  Filled 2013-01-20 (×3): qty 1

## 2013-01-20 MED ORDER — PANTOPRAZOLE SODIUM 40 MG PO TBEC
40.0000 mg | DELAYED_RELEASE_TABLET | Freq: Every day | ORAL | Status: DC
  Administered 2013-01-21 – 2013-01-23 (×3): 40 mg via ORAL
  Filled 2013-01-20 (×3): qty 1

## 2013-01-20 MED ORDER — KCL IN DEXTROSE-NACL 20-5-0.45 MEQ/L-%-% IV SOLN
INTRAVENOUS | Status: DC
  Administered 2013-01-20 – 2013-01-21 (×3): via INTRAVENOUS
  Filled 2013-01-20 (×11): qty 1000

## 2013-01-20 MED ORDER — ONDANSETRON HCL 4 MG/2ML IJ SOLN: 4.0000 mg | Freq: Four times a day (QID) | INTRAMUSCULAR | Status: DC | PRN

## 2013-01-20 MED ORDER — MIDAZOLAM HCL 2 MG/2ML IJ SOLN
INTRAMUSCULAR | Status: AC
  Filled 2013-01-20: qty 2

## 2013-01-20 MED ORDER — PROPYLENE GLYCOL 0.6 % OP SOLN: 1.0000 [drp] | Freq: Two times a day (BID) | OPHTHALMIC | Status: DC

## 2013-01-20 MED ORDER — FENTANYL CITRATE 0.05 MG/ML IJ SOLN
INTRAMUSCULAR | Status: DC | PRN
  Administered 2013-01-20 (×9): 50 ug via INTRAVENOUS

## 2013-01-20 MED ORDER — METOCLOPRAMIDE HCL 5 MG/ML IJ SOLN: 5.0000 mg | Freq: Three times a day (TID) | INTRAMUSCULAR | Status: DC | PRN

## 2013-01-20 MED ORDER — KCL IN DEXTROSE-NACL 20-5-0.45 MEQ/L-%-% IV SOLN
INTRAVENOUS | Status: AC
  Filled 2013-01-20: qty 1000

## 2013-01-20 MED ORDER — METOCLOPRAMIDE HCL 10 MG PO TABS: 5.0000 mg | ORAL_TABLET | Freq: Three times a day (TID) | ORAL | Status: DC | PRN

## 2013-01-20 MED ORDER — LACTATED RINGERS IV SOLN
INTRAVENOUS | Status: DC | PRN
  Administered 2013-01-20 (×2): via INTRAVENOUS

## 2013-01-20 MED ORDER — SENNOSIDES-DOCUSATE SODIUM 8.6-50 MG PO TABS: 1.0000 | ORAL_TABLET | Freq: Every evening | ORAL | Status: DC | PRN

## 2013-01-20 MED ORDER — CEFUROXIME SODIUM 1.5 G IJ SOLR
INTRAMUSCULAR | Status: DC | PRN
  Administered 2013-01-20: 1.5 g

## 2013-01-20 MED ORDER — POLYVINYL ALCOHOL 1.4 % OP SOLN
1.0000 [drp] | OPHTHALMIC | Status: DC | PRN
  Filled 2013-01-20: qty 15

## 2013-01-20 MED ORDER — ONDANSETRON HCL 4 MG/2ML IJ SOLN
INTRAMUSCULAR | Status: DC | PRN
  Administered 2013-01-20: 4 mg via INTRAVENOUS

## 2013-01-20 MED ORDER — SULFASALAZINE 500 MG PO TABS
1000.0000 mg | ORAL_TABLET | Freq: Two times a day (BID) | ORAL | Status: DC
  Administered 2013-01-20 – 2013-01-23 (×6): 1000 mg via ORAL
  Filled 2013-01-20 (×7): qty 2

## 2013-01-20 MED ORDER — LIDOCAINE HCL (CARDIAC) 20 MG/ML IV SOLN
INTRAVENOUS | Status: DC | PRN
  Administered 2013-01-20: 40 mg via INTRAVENOUS

## 2013-01-20 MED ORDER — MENTHOL 3 MG MT LOZG: 1.0000 | LOZENGE | OROMUCOSAL | Status: DC | PRN

## 2013-01-20 MED ORDER — ACETAMINOPHEN 650 MG RE SUPP: 650.0000 mg | Freq: Four times a day (QID) | RECTAL | Status: DC | PRN

## 2013-01-20 MED ORDER — SODIUM CHLORIDE 0.9 % IJ SOLN
INTRAMUSCULAR | Status: DC | PRN
  Administered 2013-01-20: 10:00:00

## 2013-01-20 MED ORDER — ACETAMINOPHEN 325 MG PO TABS: 650.0000 mg | ORAL_TABLET | Freq: Four times a day (QID) | ORAL | Status: DC | PRN

## 2013-01-20 MED ORDER — METOPROLOL TARTRATE 25 MG PO TABS
25.0000 mg | ORAL_TABLET | Freq: Two times a day (BID) | ORAL | Status: DC
  Administered 2013-01-20 – 2013-01-23 (×6): 25 mg via ORAL
  Filled 2013-01-20 (×8): qty 1

## 2013-01-20 MED ORDER — PHENOL 1.4 % MT LIQD: 1.0000 | OROMUCOSAL | Status: DC | PRN

## 2013-01-20 MED ORDER — BUPIVACAINE LIPOSOME 1.3 % IJ SUSP: 20.0000 mL | Freq: Once | INTRAMUSCULAR | Status: DC

## 2013-01-20 MED ORDER — FENTANYL CITRATE 0.05 MG/ML IJ SOLN
INTRAMUSCULAR | Status: AC
  Filled 2013-01-20: qty 2

## 2013-01-20 MED ORDER — OXYCODONE HCL 5 MG PO TABS
5.0000 mg | ORAL_TABLET | ORAL | Status: DC | PRN
  Administered 2013-01-20 – 2013-01-23 (×9): 10 mg via ORAL
  Filled 2013-01-20 (×9): qty 2

## 2013-01-20 MED ORDER — SODIUM CHLORIDE 0.9 % IR SOLN
Status: DC | PRN
Start: 2013-01-20 — End: 2013-01-20
  Administered 2013-01-20: 1000 mL
  Administered 2013-01-20: 3000 mL

## 2013-01-20 MED ORDER — FENTANYL CITRATE 0.05 MG/ML IJ SOLN
INTRAMUSCULAR | Status: AC
  Administered 2013-01-20: 50 ug
  Filled 2013-01-20: qty 2

## 2013-01-20 MED ORDER — HYDROXYCHLOROQUINE SULFATE 200 MG PO TABS
200.0000 mg | ORAL_TABLET | Freq: Two times a day (BID) | ORAL | Status: DC
  Administered 2013-01-20 – 2013-01-23 (×7): 200 mg via ORAL
  Filled 2013-01-20 (×8): qty 1

## 2013-01-20 MED ORDER — BISACODYL 5 MG PO TBEC: 5.0000 mg | DELAYED_RELEASE_TABLET | Freq: Every day | ORAL | Status: DC | PRN

## 2013-01-20 MED ORDER — EZETIMIBE 10 MG PO TABS
10.0000 mg | ORAL_TABLET | Freq: Every day | ORAL | Status: DC
  Administered 2013-01-20 – 2013-01-23 (×4): 10 mg via ORAL
  Filled 2013-01-20 (×4): qty 1

## 2013-01-20 MED ORDER — PROMETHAZINE HCL 25 MG/ML IJ SOLN
INTRAMUSCULAR | Status: AC
  Filled 2013-01-20: qty 1

## 2013-01-20 MED ORDER — NEOSTIGMINE METHYLSULFATE 1 MG/ML IJ SOLN
INTRAMUSCULAR | Status: DC | PRN
  Administered 2013-01-20: 3 mg via INTRAVENOUS

## 2013-01-20 MED ORDER — ONDANSETRON HCL 4 MG/2ML IJ SOLN
INTRAMUSCULAR | Status: AC
  Filled 2013-01-20: qty 2

## 2013-01-20 SURGICAL SUPPLY — 64 items
BANDAGE ELASTIC 6 VELCRO ST LF (GAUZE/BANDAGES/DRESSINGS) ×2 IMPLANT
BANDAGE ESMARK 6X9 LF (GAUZE/BANDAGES/DRESSINGS) ×1 IMPLANT
BLADE SAG 18X100X1.27 (BLADE) ×2 IMPLANT
BLADE SAW SGTL 13X75X1.27 (BLADE) ×2 IMPLANT
BLADE SURG ROTATE 9660 (MISCELLANEOUS) IMPLANT
BNDG ELASTIC 6X10 VLCR STRL LF (GAUZE/BANDAGES/DRESSINGS) ×2 IMPLANT
BNDG ESMARK 6X9 LF (GAUZE/BANDAGES/DRESSINGS) ×2
BOWL SMART MIX CTS (DISPOSABLE) ×2 IMPLANT
CAPT RP KNEE ×2 IMPLANT
CEMENT HV SMART SET (Cement) ×4 IMPLANT
CLOTH BEACON ORANGE TIMEOUT ST (SAFETY) IMPLANT
COVER SURGICAL LIGHT HANDLE (MISCELLANEOUS) ×2 IMPLANT
CUFF TOURNIQUET SINGLE 34IN LL (TOURNIQUET CUFF) ×2 IMPLANT
CUFF TOURNIQUET SINGLE 44IN (TOURNIQUET CUFF) IMPLANT
DRAPE EXTREMITY T 121X128X90 (DRAPE) ×2 IMPLANT
DRAPE U-SHAPE 47X51 STRL (DRAPES) ×2 IMPLANT
DURAPREP 26ML APPLICATOR (WOUND CARE) ×2 IMPLANT
ELECT REM PT RETURN 9FT ADLT (ELECTROSURGICAL) ×2
ELECTRODE REM PT RTRN 9FT ADLT (ELECTROSURGICAL) ×1 IMPLANT
EVACUATOR 1/8 PVC DRAIN (DRAIN) ×2 IMPLANT
GAUZE XEROFORM 1X8 LF (GAUZE/BANDAGES/DRESSINGS) ×2 IMPLANT
GLOVE BIO SURGEON STRL SZ 6.5 (GLOVE) ×6 IMPLANT
GLOVE BIO SURGEON STRL SZ7 (GLOVE) ×4 IMPLANT
GLOVE BIO SURGEON STRL SZ7.5 (GLOVE) ×2 IMPLANT
GLOVE BIO SURGEON STRL SZ8.5 (GLOVE) ×2 IMPLANT
GLOVE BIOGEL PI IND STRL 6.5 (GLOVE) ×3 IMPLANT
GLOVE BIOGEL PI IND STRL 7.0 (GLOVE) ×1 IMPLANT
GLOVE BIOGEL PI IND STRL 8 (GLOVE) ×1 IMPLANT
GLOVE BIOGEL PI IND STRL 9 (GLOVE) ×1 IMPLANT
GLOVE BIOGEL PI INDICATOR 6.5 (GLOVE) ×3
GLOVE BIOGEL PI INDICATOR 7.0 (GLOVE) ×1
GLOVE BIOGEL PI INDICATOR 8 (GLOVE) ×1
GLOVE BIOGEL PI INDICATOR 9 (GLOVE) ×1
GOWN PREVENTION PLUS XLARGE (GOWN DISPOSABLE) IMPLANT
GOWN STRL NON-REIN LRG LVL3 (GOWN DISPOSABLE) ×4 IMPLANT
GOWN STRL REIN XL XLG (GOWN DISPOSABLE) ×6 IMPLANT
HANDPIECE INTERPULSE COAX TIP (DISPOSABLE) ×1
HOOD PEEL AWAY FACE SHEILD DIS (HOOD) ×6 IMPLANT
KIT BASIN OR (CUSTOM PROCEDURE TRAY) ×2 IMPLANT
KIT ROOM TURNOVER OR (KITS) ×2 IMPLANT
MANIFOLD NEPTUNE II (INSTRUMENTS) ×2 IMPLANT
NDL SAFETY ECLIPSE 18X1.5 (NEEDLE) IMPLANT
NEEDLE HYPO 18GX1.5 SHARP (NEEDLE)
NEEDLE SPNL 18GX3.5 QUINCKE PK (NEEDLE) ×2 IMPLANT
NS IRRIG 1000ML POUR BTL (IV SOLUTION) ×2 IMPLANT
PACK TOTAL JOINT (CUSTOM PROCEDURE TRAY) ×2 IMPLANT
PAD ARMBOARD 7.5X6 YLW CONV (MISCELLANEOUS) ×2 IMPLANT
PADDING CAST COTTON 6X4 STRL (CAST SUPPLIES) ×2 IMPLANT
PENCIL BUTTON HOLSTER BLD 10FT (ELECTRODE) ×2 IMPLANT
SET HNDPC FAN SPRY TIP SCT (DISPOSABLE) ×1 IMPLANT
SPONGE GAUZE 4X4 12PLY (GAUZE/BANDAGES/DRESSINGS) ×2 IMPLANT
STAPLER VISISTAT 35W (STAPLE) ×2 IMPLANT
SUCTION FRAZIER TIP 10 FR DISP (SUCTIONS) ×2 IMPLANT
SUT VIC AB 0 CTX 36 (SUTURE) ×1
SUT VIC AB 0 CTX36XBRD ANTBCTR (SUTURE) ×1 IMPLANT
SUT VIC AB 1 CTX 36 (SUTURE) ×2
SUT VIC AB 1 CTX36XBRD ANBCTR (SUTURE) ×2 IMPLANT
SUT VIC AB 2-0 CT1 27 (SUTURE) ×2
SUT VIC AB 2-0 CT1 TAPERPNT 27 (SUTURE) ×2 IMPLANT
SYR 50ML LL SCALE MARK (SYRINGE) ×2 IMPLANT
TOWEL OR 17X24 6PK STRL BLUE (TOWEL DISPOSABLE) ×2 IMPLANT
TOWEL OR 17X26 10 PK STRL BLUE (TOWEL DISPOSABLE) ×2 IMPLANT
TRAY FOLEY CATH 14FR (SET/KITS/TRAYS/PACK) ×2 IMPLANT
WATER STERILE IRR 1000ML POUR (IV SOLUTION) ×2 IMPLANT

## 2013-01-20 NOTE — Transfer of Care (Signed)
Immediate Anesthesia Transfer of Care Note  Patient: [name]  Procedure(s) Performed: Procedure(s): RIGHT TOTAL KNEE ARTHROPLASTY (Right)  Patient Location: PACU  Anesthesia Type:GA combined with regional for post-op pain  Level of Consciousness: awake, alert , oriented and patient cooperative  Airway & Oxygen Therapy: Patient Spontanous Breathing and Patient connected to nasal cannula oxygen  Post-op Assessment: Report given to PACU RN, Post -op Vital signs reviewed and stable and Patient moving all extremities  Post vital signs: Reviewed and stable  Complications: No apparent anesthesia complications

## 2013-01-20 NOTE — Anesthesia Preprocedure Evaluation (Addendum)
Anesthesia Evaluation  Patient identified by MRN, date of birth, ID band Patient awake    Reviewed: Allergy & Precautions, H&P , NPO status , Patient's Chart, lab work & pertinent test results, reviewed documented beta blocker date and time   Airway Mallampati: I TM Distance: >3 FB Neck ROM: Full    Dental no notable dental hx. (+) Edentulous Upper, Edentulous Lower and Dental Advisory Given   Pulmonary neg pulmonary ROS,  breath sounds clear to auscultation  Pulmonary exam normal       Cardiovascular hypertension, On Medications and On Home Beta Blockers + Past MI and + Peripheral Vascular Disease Rhythm:Regular Rate:Normal     Neuro/Psych negative neurological ROS  negative psych ROS   GI/Hepatic negative GI ROS, Neg liver ROS,   Endo/Other  negative endocrine ROS  Renal/GU negative Renal ROS  negative genitourinary   Musculoskeletal   Abdominal   Peds  Hematology negative hematology ROS (+)   Anesthesia Other Findings   Reproductive/Obstetrics negative OB ROS                           Anesthesia Physical Anesthesia Plan  ASA: III  Anesthesia Plan: General and Regional   Post-op Pain Management:    Induction: Intravenous  Airway Management Planned: Oral ETT  Additional Equipment:   Intra-op Plan:   Post-operative Plan: Extubation in OR  Informed Consent: I have reviewed the patients History and Physical, chart, labs and discussed the procedure including the risks, benefits and alternatives for the proposed anesthesia with the patient or authorized representative who has indicated his/her understanding and acceptance.   Dental advisory given  Plan Discussed with: CRNA  Anesthesia Plan Comments:        Anesthesia Quick Evaluation

## 2013-01-20 NOTE — Progress Notes (Signed)
UR COMPLETED  

## 2013-01-20 NOTE — Progress Notes (Signed)
Orthopedic Tech Progress Note Patient Details:  [name] 03/09/1931 [id number]  CPM Right Knee CPM Right Knee: On Right Knee Flexion (Degrees): 60 Right Knee Extension (Degrees): 0   Cammer, Mickie Bail 01/20/2013, 12:25 PM

## 2013-01-20 NOTE — Evaluation (Signed)
Physical Therapy Evaluation Patient Details Name: [name] MRN: [id number] DOB: December 11, 1930 Today's Date: 01/20/2013 Time: 4098-1191 PT Time Calculation (min): 19 min  PT Assessment / Plan / Recommendation History of Present Illness  s/p RTKA  Clinical Impression  Patient is s/p above surgery resulting in functional limitations due to the deficits listed below (see PT Problem List).  Patient will benefit from skilled PT to increase their independence and safety with mobility to allow discharge to the venue listed below.       PT Assessment  Patient needs continued PT services    Follow Up Recommendations  Home health PT;Supervision - Intermittent    Does the patient have the potential to tolerate intense rehabilitation      Barriers to Discharge Decreased caregiver support Must be independent for a few hours at a time    Equipment Recommendations  Rolling walker with 5" wheels;3in1 (PT)    Recommendations for Other Services OT consult   Frequency 7X/week    Precautions / Restrictions Precautions Precautions: Knee Restrictions Weight Bearing Restrictions: Yes RLE Weight Bearing: Weight bearing as tolerated   Pertinent Vitals/Pain Painful with standing; reaching 5-6/10 R knee patient repositioned for comfort and optimal knee ext      Mobility  Bed Mobility Bed Mobility: Supine to Sit;Sitting - Scoot to Edge of Bed Supine to Sit: 4: Min guard Sitting - Scoot to [employer] of Bed: 4: Min guard Details for Bed Mobility Assistance: Cues for technique; pretty smooth transition Transfers Transfers: Sit to Stand;Stand to Sit Sit to Stand: 4: Min assist;From bed Stand to Sit: 4: Min assist;To chair/3-in-1 Details for Transfer Assistance: Cues for hand placement and safety Ambulation/Gait Ambulation/Gait Assistance: 4: Min assist Ambulation Distance (Feet): 6 Feet Assistive device: Rolling walker Ambulation/Gait Assistance Details: Cues for gait sequence and to activate  R quad for stance stability Gait Pattern: Step-to pattern    Exercises Total Joint Exercises Quad Sets: AROM;Right;5 reps Heel Slides: AROM;Right;5 reps Straight Leg Raises: AAROM;Right;5 reps   PT Diagnosis: Difficulty walking;Acute pain  PT Problem List: Decreased strength;Decreased range of motion;Decreased activity tolerance;Decreased balance;Decreased mobility;Decreased knowledge of use of DME;Pain;Decreased knowledge of precautions PT Treatment Interventions: DME instruction;Gait training;Stair training;Functional mobility training;Therapeutic activities;Therapeutic exercise;Balance training;Patient/family education     PT Goals(Current goals can be found in the care plan section) Acute Rehab PT Goals Patient Stated Goal: decr pain PT Goal Formulation: With patient Time For Goal Achievement: 01/27/13 Potential to Achieve Goals: Good  Visit Information  Last PT Received On: 01/20/13 Assistance Needed: +1 History of Present Illness: s/p RTKA       Prior Functioning  Home Living Family/patient expects to be discharged to:: Private residence Living Arrangements: Spouse/significant other Available Help at Discharge: Family;Available PRN/intermittently Type of Home: House Home Access: Stairs to enter [employer] of Steps: 3 Entrance Stairs-Rails: None Home Layout: One level Home Equipment: None Prior Function Level of Independence: Independent Communication Communication: No difficulties    Cognition  Cognition Arousal/Alertness: Awake/alert Behavior During Therapy: WFL for tasks assessed/performed Overall Cognitive Status: Within Functional Limits for tasks assessed    Extremity/Trunk Assessment Upper Extremity Assessment Upper Extremity Assessment: Overall WFL for tasks assessed Lower Extremity Assessment Lower Extremity Assessment: RLE deficits/detail RLE Deficits / Details: Quad set present; able to perform straight leg raise with approx 10 deg quad  lag   Balance    End of Session PT - End of Session Equipment Utilized During Treatment: Gait belt Activity Tolerance: Patient tolerated treatment well Patient left: in  chair;with call bell/phone within reach;with family/visitor present CPM Right Knee CPM Right Knee: On Right Knee Flexion (Degrees): 60 Right Knee Extension (Degrees): 0  GP     Van Clines [facility] [place], [place] [phone]  01/20/2013, 4:27 PM

## 2013-01-20 NOTE — Anesthesia Postprocedure Evaluation (Signed)
  Anesthesia Post-op Note  Patient: [name]  Procedure(s) Performed: Procedure(s): RIGHT TOTAL KNEE ARTHROPLASTY (Right)  Patient Location: PACU  Anesthesia Type:General and block  Level of Consciousness: awake and alert   Airway and Oxygen Therapy: Patient Spontanous Breathing  Post-op Pain: mild  Post-op Assessment: Post-op Vital signs reviewed, Patient's Cardiovascular Status Stable and Respiratory Function Stable  Post-op Vital Signs: Reviewed  Filed Vitals:   01/20/13 1218  BP: 121/69  Pulse: 75  Temp:   Resp: 21    Complications: No apparent anesthesia complications

## 2013-01-20 NOTE — Interval H&P Note (Signed)
History and Physical Interval Note:  01/20/2013 9:06 AM  [name]  has presented today for surgery, with the diagnosis of RIGHT KNEE END STAGE DEGENERATIVE JOINT DISEASE  The various methods of treatment have been discussed with the patient and family. After consideration of risks, benefits and other options for treatment, the patient has consented to  Procedure(s): RIGHT TOTAL KNEE ARTHROPLASTY (Right) as a surgical intervention .  The patient's history has been reviewed, patient examined, no change in status, stable for surgery.  I have reviewed the patient's chart and labs.  Questions were answered to the patient's satisfaction.     [name]

## 2013-01-20 NOTE — Anesthesia Procedure Notes (Signed)
Anesthesia Regional Block:  Adductor canal block  Pre-Anesthetic Checklist: ,, timeout performed, Correct Patient, Correct Site, Correct Laterality, Correct Procedure, Correct Position, site marked, Risks and benefits discussed, pre-op evaluation,  At surgeon's request and post-op pain management  Laterality: Right  Prep: Maximum Sterile Barrier Precautions used and chloraprep       Needles:  Injection technique: Single-shot  Needle Type: Echogenic Stimulator Needle      Needle Gauge: 21 and 21 G    Additional Needles:  Procedures: ultrasound guided (picture in chart) Adductor canal block  Nerve Stimulator or Paresthesia:  Response: Patellar respose,   Additional Responses:   Narrative:  Start time: 01/20/2013 8:30 AM End time: 01/20/2013 8:40 AM Injection made incrementally with aspirations every 5 mL. Anesthesiologist: Marcquis Ridlon,MD  Additional Notes: 2% Lidocaine skin wheel.   Femoral nerve block

## 2013-01-20 NOTE — Op Note (Signed)
PATIENT ID:      [name]  MRN:     [id number] DOB/AGE:    [birth year] / 79 y.o.       OPERATIVE REPORT    DATE OF PROCEDURE:  01/20/2013       PREOPERATIVE DIAGNOSIS:   RIGHT KNEE END STAGE DEGENERATIVE JOINT DISEASE      Estimated body mass index is 37.89 kg/(m^2) as calculated from the following:   Height as of 01/13/13: 5' (1.524 m).   Weight as of 05/01/12: 87.998 kg (194 lb).                                                        POSTOPERATIVE DIAGNOSIS:   RIGHT KNEE END STAGE DEGENERATIVE JOINT                                                                       PROCEDURE:  Procedure(s): RIGHT TOTAL KNEE ARTHROPLASTY Using Depuy Sigma RP implants #3R Femur, #4Tibia, 10mm sigma RP bearing, 41 Patella     SURGEON: Geri Hepler J    ASSISTANT:   Eric K. [employer]   (Present and scrubbed throughout the case, critical for assistance with exposure, retraction, instrumentation, and closure.)         ANESTHESIA: GET Exparel  DRAINS: foley, 2 medium hemovac in knee   TOURNIQUET TIME:   COMPLICATIONS:  None     SPECIMENS: None   INDICATIONS FOR PROCEDURE: The patient has  RIGHT KNEE END STAGE DEGENERATIVE JOINT DISEASE, valgus deformities, XR shows bone on bone arthritis. Patient has failed all conservative measures including anti-inflammatory medicines, narcotics, attempts at exercise and weight loss, cortisone injections and viscosupplementation.  MCL 1+ lax. Risks and benefits of surgery have been discussed, questions answered.   DESCRIPTION OF PROCEDURE: The patient identified by armband, received  IV antibiotics, in the holding area at [facility]. Patient taken to the operating room, appropriate anesthetic  monitors were attached, and general endotracheal anesthesia induced with  the patient in supine position, Foley catheter was inserted. Tourniquet  applied high to the operative thigh. Lateral post and foot positioner  applied to the table, the lower  extremity was then prepped and draped  in usual sterile fashion from the ankle to the tourniquet. Time-out procedure was performed. The limb was wrapped with an Esmarch bandage and the tourniquet inflated to 350 mmHg. We began the operation by making the anterior midline incision starting at handbreadth above the patella going over the patella 1 cm medial to and  4 cm distal to the tibial tubercle. Small bleeders in the skin and the  subcutaneous tissue identified and cauterized. Transverse retinaculum was incised and reflected medially and a medial parapatellar arthrotomy was accomplished. the patella was everted and theprepatellar fat pad resected. The superficial medial collateral  ligament was then elevated from anterior to posterior along the proximal  flare of the tibia and anterior half of the menisci resected. The knee was hyperflexed exposing bone on bone arthritis. Peripheral and notch osteophytes as well as the cruciate ligaments  were then resected. We continued to  work our way around posteriorly along the proximal tibia, and externally  rotated the tibia subluxing it out from underneath the femur. A McHale  retractor was placed through the notch and a lateral Hohmann retractor  placed, and we then drilled through the proximal tibia in line with the  axis of the tibia followed by an intramedullary guide rod and 2-degree  posterior slope cutting guide. The tibial cutting guide was pinned into place  allowing resection of 5 mm of bone medially and about 0 mm of bone  laterally because of her varus deformity. Satisfied with the tibial resection, we then  entered the distal femur 2 mm anterior to the PCL origin with the  intramedullary guide rod and applied the distal femoral cutting guide  set at 11mm, with 5 degrees of valgus. This was pinned along the  epicondylar axis. At this point, the distal femoral cut was accomplished without difficulty. We then sized for a #3R femoral component and  pinned the guide in 0 degrees of external rotation.The chamfer cutting guide was pinned into place. The anterior, posterior, and chamfer cuts were accomplished without difficulty followed by  the Sigma RP box cutting guide and the box cut. We also removed posterior osteophytes from the posterior femoral condyles. At this  time, the knee was brought into full extension. We checked our  extension and flexion gaps and found them symmetric at 10mm.  The patella thickness measured at 25 mm. We set the cutting guide at 15 and removed the posterior 10 mm  of the patella, sized for a 41 button and drilled the lollipop. The knee  was then once again hyperflexed exposing the proximal tibia. We sized for a #4 tibial base plate, applied the smokestack and the conical reamer followed by the the Delta fin keel punch. We then hammered into place the Sigma RP trial femoral component, inserted a 10-mm trial bearing, trial patellar button, and took the knee through range of motion from 0-130 degrees. No thumb pressure was required for patellar  tracking. At this point, all trial components were removed, a double batch of DePuy HV cement with 1500 mg of Zinacef was mixed and applied to all bony metallic mating surfaces except for the posterior condyles of the femur itself. In order, we  hammered into place the tibial tray and removed excess cement, the femoral component and removed excess cement, a 10-mm Sigma RP bearing  was inserted, and the knee brought to full extension with compression.  The patellar button was clamped into place, and excess cement  removed. While the cement cured the wound was irrigated out with normal saline solution pulse lavage, and medium Hemovac drains were placed from an anterolateral  approach. Ligament stability and patellar tracking were checked and found to be excellent. The parapatellar arthrotomy was closed with  running #1 Vicryl suture. The subcutaneous tissue with 0 and 2-0 undyed   Vicryl suture, and the skin with skin staples. A dressing of Xeroform,  4 x 4, dressing sponges, Webril, and Ace wrap applied. The patient  awakened, extubated, and taken to recovery room without difficulty.   Teshara Moree J 01/20/2013, 10:37 AM

## 2013-01-21 ENCOUNTER — Encounter (HOSPITAL_COMMUNITY): Payer: Self-pay | Admitting: Orthopedic Surgery

## 2013-01-21 LAB — CBC
HCT: 31.5 % — ABNORMAL LOW (ref 39.0–52.0)
Hemoglobin: 10.7 g/dL — ABNORMAL LOW (ref 13.0–17.0)
MCH: 33.4 pg (ref 26.0–34.0)
MCV: 98.4 fL (ref 78.0–100.0)
Platelets: 181 10*3/uL (ref 150–400)
RBC: 3.2 MIL/uL — ABNORMAL LOW (ref 4.22–5.81)
WBC: 9.9 10*3/uL (ref 4.0–10.5)

## 2013-01-21 NOTE — Progress Notes (Signed)
Orthopedic Tech Progress Note Patient Details:  [name] 1930-10-22 [id number] On cpm at 9:00 pm 0-30 RLE  [name], male   DOB: 09/11/1930, 79 y.o.   MRN: [id number]   [name] 01/21/2013, 8:58 PM

## 2013-01-21 NOTE — Progress Notes (Signed)
[name], male   DOB: [birth year], 79 y.o.   MRN: [id number] [name]  MRN: [id number]  DOB/AGE:  1931/02/05 / 79 y.o.  1 Day Post-Op Procedure(s) (LRB): RIGHT TOTAL KNEE ARTHROPLASTY (Right)    PROGRESS NOTE Subjective: Patient is alert, oriented, no Nausea, no Vomiting, yes passing gas, no Bowel Movement. Taking PO well. Denies SOB, Chest or Calf Pain. Using Incentive Spirometer, PAS in place. Ambulate patient walks 6 feet in room, CPM 0-60 Patient reports pain as 4 on 0-10 scale  .    Objective: Vital signs in last 24 hours: Filed Vitals:   01/20/13 2050 01/20/13 2357 01/21/13 0244 01/21/13 0616  BP: 95/51  98/52 108/62  Pulse: 107  91 95  Temp: 99.6 F (37.6 C)  99.2 F (37.3 C) 99.4 F (37.4 C)  TempSrc: Oral  Oral Oral  Resp: 18 18 16 16   SpO2: 96% 96% 99% 92%      Intake/Output from previous day: I/O last 3 completed shifts: In: 4690 [P.O.:1440; I.V.:3250] Out: 2125 [Urine:1475; Drains:550; Blood:100]   Intake/Output this shift:     LABORATORY DATA:  Recent Labs  01/21/13 0540  WBC 9.9  HGB 10.7*  HCT 31.5*  PLT 181    Examination: Neurologically intact ABD soft Neurovascular intact Sensation intact distally Intact pulses distally Dorsiflexion/Plantar flexion intact Incision: no drainage No cellulitis present Compartment soft} Blood and plasma separated in drain indicating minimal recent drainage, drain pulled without difficulty.  Assessment:   1 Day Post-Op Procedure(s) (LRB): RIGHT TOTAL KNEE ARTHROPLASTY (Right) ADDITIONAL DIAGNOSIS:  Hypertension  Plan: PT/OT WBAT, CPM 5/hrs day until ROM 0-90 degrees, then D/C CPM DVT Prophylaxis:  SCDx72hrs, ASA 325 mg BID x 2 weeks DISCHARGE PLAN: Home, patient would like to go home, but understands that at age 30 if his physical therapy goes slow he would be a candidate for rehabilitation probably at [facility] near his home. At this time, this is his wife's  preference DISCHARGE NEEDS: HHPT, HHRN, CPM, Walker and 3-in-1 comode seat     Kandyce Dieguez J 01/21/2013, 7:12 AM

## 2013-01-21 NOTE — Plan of Care (Signed)
Problem: Consults Goal: Diagnosis- Total Joint Replacement Primary Total Knee Right     

## 2013-01-21 NOTE — Evaluation (Signed)
Occupational Therapy Evaluation Patient Details Name: [name] MRN: [id number] DOB: 1930-10-14 Today's Date: 01/21/2013 Time: 8469-6295 OT Time Calculation (min): 38 min  OT Assessment / Plan / Recommendation History of present illness s/p RTKA   Clinical Impression   Pt is s/p TKA resulting in the deficits listed below (see OT Problem List). Patient is currently min guard to mod assist with functional mobility and BADL. Pt will benefit from skilled OT to increase their safety and independence with ADL and functional mobility for ADL to facilitate discharge to venue listed below. SNF vs. HHOT if patient progresses quickly      OT Assessment  Patient needs continued OT Services    Follow Up Recommendations  Home health OT if progresses quickly and family can provide 24/7 vs.SNF    Barriers to Discharge Decreased caregiver support Wife reports that family is working on 24/7 supervision.  Wife works.  Equipment Recommendations  3 in 1 bedside comode if patient goes home   Frequency  Min 2X/week    Precautions / Restrictions Precautions Precautions: Knee;Fall Restrictions RLE Weight Bearing: Weight bearing as tolerated   Pertinent Vitals/Pain 8/10 premedicated, activity, rest, repositioned    ADL  Grooming: Performed Where Assessed - Grooming: Supported standing;Unsupported standing Upper Body Bathing: Simulated;Set up;Supervision/safety Where Assessed - Upper Body Bathing: Supported sitting Lower Body Bathing: Simulated;Moderate assistance Where Assessed - Lower Body Bathing: Supported sitting;Supported sit to stand;Supported standing Upper Body Dressing: Simulated Lower Body Dressing: Simulated;Performed;Moderate assistance Where Assessed - Lower Body Dressing: Supported sit to stand;Supported sitting;Supported standing Toilet Transfer: Performed;Min Pension scheme manager Method: Sit to stand;Stand Wellsite geologist: Raised toilet seat with arms (or  3-in-1 over toilet);Grab bars Toileting - Clothing Manipulation and Hygiene: Simulated;Min guard Where Assessed - Engineer, mining and Hygiene: Sit to stand from 3-in-1 or toilet;Standing Transfers/Ambulation Related to ADLs: heavily relied on grab bar for toilet transfer, vcs for safety with RW especially during turns. ADL Comments: Patient reports that he has some AE at home and he really hopes to go home if family can arrange for 24/7 supervision.  Wife states that she can look into having a grab bar installed next to commode to improve safety and independence    OT Diagnosis: Generalized weakness  OT Problem List: Decreased activity tolerance;Impaired balance (sitting and/or standing);Decreased safety awareness;Pain OT Treatment Interventions: Self-care/ADL training;Energy conservation;DME and/or AE instruction;Therapeutic activities;Patient/family education;Balance training   OT Goals(Current goals can be found in the care plan section) Acute Rehab OT Goals Patient Stated Goal: go home OT Goal Formulation: With patient Time For Goal Achievement: 02/04/13 Potential to Achieve Goals: Good  Visit Information  Last OT Received On: 01/21/13 Assistance Needed: +1 History of Present Illness: s/p RTKA       Prior Functioning     Home Living Family/patient expects to be discharged to:: Private residence Living Arrangements: Spouse/significant other Available Help at Discharge: Family;Available PRN/intermittently Type of Home: House Home Access: Stairs to enter [employer] of Steps: 3 Entrance Stairs-Rails: None Home Layout: One level Home Equipment: None Prior Function Level of Independence: Independent Communication Communication: No difficulties;HOH Dominant Hand: Right    Cognition  Cognition Arousal/Alertness: Awake/alert Behavior During Therapy: WFL for tasks assessed/performed Overall Cognitive Status: Within Functional Limits for tasks  assessed    Extremity/Trunk Assessment Upper Extremity Assessment Upper Extremity Assessment: Overall WFL for tasks assessed Lower Extremity Assessment Lower Extremity Assessment: Defer to PT evaluation     Mobility Transfers Sit to Stand: 4: Min guard;From chair/3-in-1;With  armrests Stand to Sit: 4: Min guard;To chair/3-in-1;With armrests Details for Transfer Assistance: Cues for hand placement and safety; Decr control of descent with stand to sit     End of Session OT - End of Session Equipment Utilized During Treatment: Rolling walker Activity Tolerance: Patient tolerated treatment well;Patient limited by pain Patient left: in chair;with call bell/phone within reach;with family/visitor present  GO     [name] 01/21/2013, 3:22 PM

## 2013-01-21 NOTE — Clinical Social Work Placement (Addendum)
Clinical Social Work Department  CLINICAL SOCIAL WORK PLACEMENT NOTE   Patient:[name] Account Number: [id number]  Admit date: 01/19/13 Clinical Social Worker: Sabino Niemann LCSWA Date/time: 01/21/2013 4:00 PM  Clinical Social Work is seeking post-discharge placement for this patient at the following level of care: SKILLED NURSING (*CSW will update this form in Epic as items are completed)  10/28/2014Patient/family provided with [facility] Department of Clinical Social Work's list of facilities offering this level of care within the geographic area requested by the patient (or if unable, by the patient's family).  01/21/2013 Patient/family informed of their freedom to choose among providers that offer the needed level of care, that participate in Medicare, Medicaid or managed care program needed by the patient, have an available bed and are willing to accept the patient.  01/21/2013 Patient/family informed of [facility]' ownership interest in [facility], as well as of the fact that they are under no obligation to receive care at this facility.  PASARR submitted to EDS on 01/21/2013  PASARR number received from EDS on 01/21/2013 FL2 transmitted to all facilities in geographic area requested by pt/family on 01/21/2013 FL2 transmitted to all facilities within larger geographic area on  Patient informed that his/her managed care company has contracts with or will negotiate with certain facilities, including the following:  Patient/family informed of bed offers received: 01/23/2013 Patient chooses bed at [facility] Physician recommends and patient chooses bed at  Patient to be transferred to [facility] on 01/23/2013 Patient to be transferred to facility by [facility] The following physician request were entered in Epic:  Additional Comments:

## 2013-01-21 NOTE — Clinical Social Work Psychosocial (Signed)
Patient is not sure if he wants to go to SNF or go Home. Patient was agreeable to having his information faxed out. CSW wiill f/u with patient in the am about his decision. Patient lives with his wife.  Sabino Niemann, MSW, [employer] [phone]

## 2013-01-21 NOTE — Progress Notes (Signed)
Physical Therapy Treatment Patient Details Name: [name] MRN: [id number] DOB: 16-May-1930 Today's Date: 01/21/2013 Time: 1191-4782 PT Time Calculation (min): 26 min  PT Assessment / Plan / Recommendation  History of Present Illness s/p RTKA   PT Comments   Making progress with safety; Pt is open to SNF for rehab; would rather go home, though; I'm on the fence -- going home is not out of the question, but would recommend 24 hour assist; Still a short term rehab stay would maximize his safety with mobility  Follow Up Recommendations  SNF;Supervision/Assistance - 24 hour;Other (comment) (Maybe home if 24 hour assist can be arranged)     Does the patient have the potential to tolerate intense rehabilitation     Barriers to Discharge        Equipment Recommendations  Rolling walker with 5" wheels;3in1 (PT)    Recommendations for Other Services    Frequency 7X/week   Progress towards PT Goals Progress towards PT goals: Progressing toward goals  Plan Current plan remains appropriate    Precautions / Restrictions Precautions Precautions: Knee;Fall Restrictions RLE Weight Bearing: Weight bearing as tolerated   Pertinent Vitals/Pain 6/10 knee pain; patient repositioned for comfort in CPM    Mobility  Transfers Transfers: Sit to Stand;Stand to Sit Sit to Stand: 4: Min guard;From chair/3-in-1 Stand to Sit: 4: Min guard;To chair/3-in-1 Details for Transfer Assistance: Cues for hand placement and safety;  Decr control of descent with stand to sit; performed 4 reps for instruction; better control with practice Ambulation/Gait Ambulation/Gait Assistance: 4: Min guard Ambulation Distance (Feet): 80 Feet Assistive device: Rolling walker Ambulation/Gait Assistance Details: Cues to activate quad for R stance stability Gait Pattern: Step-through pattern Gait velocity: slowed    Exercises Total Joint Exercises Long Arc Quad: AAROM;Right;10 reps   PT Diagnosis:    PT Problem  List:   PT Treatment Interventions:     PT Goals (current goals can now be found in the care plan section) Acute Rehab PT Goals Patient Stated Goal: decr pain  Visit Information  Last PT Received On: 01/21/13 Assistance Needed: +1 History of Present Illness: s/p RTKA    Subjective Data  Subjective: Open to SNF for rehab if necessary Patient Stated Goal: decr pain   Cognition  Cognition Arousal/Alertness: Awake/alert Behavior During Therapy: WFL for tasks assessed/performed Overall Cognitive Status: Within Functional Limits for tasks assessed    Balance     End of Session PT - End of Session Equipment Utilized During Treatment: Gait belt Activity Tolerance: Patient tolerated treatment well Patient left: with call bell/phone within reach;in bed;in CPM Nurse Communication: Mobility status   GP     [name], [name] [phone]   01/21/2013, 4:35 PM

## 2013-01-21 NOTE — Progress Notes (Signed)
01/21/13 Set up with HHPT, OT and RN with [facility] by MD office. PT eval recommended SNF. Referral made to CSW. Will continbue to follow ffor d/c needs. Jacquelynn Cree RN, BSN, CCM

## 2013-01-21 NOTE — Progress Notes (Signed)
Physical Therapy Treatment Patient Details Name: [name] MRN: [id number] DOB: 17-May-1930 Today's Date: 01/21/2013 Time: 2956-2130 PT Time Calculation (min): 20 min  PT Assessment / Plan / Recommendation  History of Present Illness s/p RTKA   PT Comments   Making progress with knee stability and activity tolerance; We discussed dc plan more, and, considering pt will not have 24 hour assist at home initially, and still requires supervision/assist for safety, especially with transfers, I be lieve [name]   Follow Up Recommendations  SNF;Supervision/Assistance - 24 hour     Does the patient have the potential to tolerate intense rehabilitation     Barriers to Discharge        Equipment Recommendations  Rolling walker with 5" wheels;3in1 (PT)    Recommendations for Other Services OT consult  Frequency 7X/week   Progress towards PT Goals Progress towards PT goals: Progressing toward goals  Plan Discharge plan needs to be updated    Precautions / Restrictions Precautions Precautions: Knee Restrictions Weight Bearing Restrictions: Yes RLE Weight Bearing: Weight bearing as tolerated   Pertinent Vitals/Pain 5/10 R knee; patient repositioned for comfort and optimal knee extension     Mobility  Transfers Transfers: Sit to Stand;Stand to Sit Sit to Stand: 4: Min guard;From chair/3-in-1 Stand to Sit: 4: Min guard;To chair/3-in-1 Details for Transfer Assistance: Cues for hand placement and safety; Decr control of descent with stand to sit Ambulation/Gait Ambulation/Gait Assistance: 4: Min guard Ambulation Distance (Feet): 60 Feet Assistive device: Rolling walker Ambulation/Gait Assistance Details: Cues to activate quad for R stance stability Gait Pattern: Step-through pattern Gait velocity: slowed    Exercises Total Joint Exercises Quad Sets: AROM;Right;10  reps Heel Slides: AAROM;Right;10 reps Hip ABduction/ADduction: AROM;Right;10 reps Straight Leg Raises: AAROM;Right;10 reps Long Arc Quad: AAROM;Right;10 reps Knee Flexion: AAROM;Right;10 reps;Seated   PT Diagnosis:    PT Problem List:   PT Treatment Interventions:     PT Goals (current goals can now be found in the care plan section) Acute Rehab PT Goals Patient Stated Goal: decr pain  Visit Information  Last PT Received On: 01/21/13 Assistance Needed: +1 History of Present Illness: s/p RTKA    Subjective Data  Subjective: Open to SNF for rehab if necessary Patient Stated Goal: decr pain   Cognition  Cognition Arousal/Alertness: Awake/alert Behavior During Therapy: WFL for tasks assessed/performed Overall Cognitive Status: Within Functional Limits for tasks assessed    Balance     End of Session PT - End of Session Equipment Utilized During Treatment: Gait belt Activity Tolerance: Patient tolerated treatment well Patient left: in chair;with call bell/phone within reach Nurse Communication: Mobility status CPM Right Knee CPM Right Knee: Off   GP     [name], [name] [phone]  01/21/2013, 11:26 AM

## 2013-01-22 LAB — CBC
HCT: 30.3 % — ABNORMAL LOW (ref 39.0–52.0)
Hemoglobin: 10.6 g/dL — ABNORMAL LOW (ref 13.0–17.0)
MCH: 33.5 pg (ref 26.0–34.0)
MCHC: 35 g/dL (ref 30.0–36.0)
MCV: 95.9 fL (ref 78.0–100.0)
Platelets: 151 10*3/uL (ref 150–400)
RDW: 13.1 % (ref 11.5–15.5)

## 2013-01-22 MED ORDER — ASPIRIN EC 325 MG PO TBEC: 325.0000 mg | DELAYED_RELEASE_TABLET | Freq: Two times a day (BID) | ORAL | Status: DC

## 2013-01-22 MED ORDER — OXYCODONE-ACETAMINOPHEN 5-325 MG PO TABS: 1.0000 | ORAL_TABLET | ORAL | Status: DC | PRN

## 2013-01-22 MED ORDER — METHOCARBAMOL 500 MG PO TABS: 500.0000 mg | ORAL_TABLET | Freq: Two times a day (BID) | ORAL | Status: DC

## 2013-01-22 NOTE — Progress Notes (Signed)
Physical Therapy Treatment Patient Details Name: [name] MRN: [id number] DOB: 05/01/1930 Today's Date: 01/22/2013 Time: 1308-6578 PT Time Calculation (min): 24 min  PT Assessment / Plan / Recommendation  History of Present Illness s/p RTKA   PT Comments   Pt cont's to make steady progress with mobility but still feel pt would best benefit from ST-SNF to maximize functional recovery & safety & unsure if he will have 24 hr assist at home.  Pt's son arrived at end of session & states he & rest of family are trying to arrange schedule to provide appropriate assistance.     Follow Up Recommendations  SNF;Supervision/Assistance - 24 hour;Other (comment) (home if 24 hr (A) can be arranged)     Does the patient have the potential to tolerate intense rehabilitation     Barriers to Discharge        Equipment Recommendations  Rolling walker with 5" wheels;3in1 (PT)    Recommendations for Other Services OT consult  Frequency 7X/week   Progress towards PT Goals Progress towards PT goals: Progressing toward goals  Plan Current plan remains appropriate    Precautions / Restrictions Precautions Precautions: Knee;Fall Restrictions Weight Bearing Restrictions: Yes RLE Weight Bearing: Weight bearing as tolerated   Pertinent Vitals/Pain "it's not bad" in response to asking about pain.      Mobility  Bed Mobility Bed Mobility: Not assessed Supine to Sit: HOB flat;5: Supervision Sitting - Scoot to Edge of Bed: 5: Supervision Details for Bed Mobility Assistance: no cues needed Transfers Transfers: Sit to Stand;Stand to Sit Sit to Stand: 4: Min guard;With upper extremity assist;With armrests;From chair/3-in-1 Stand to Sit: 4: Min guard;With upper extremity assist;With armrests;To chair/3-in-1 Details for Transfer Assistance: Cues for hand placement and safety; Decr control of descent with stand to sit Ambulation/Gait Ambulation/Gait Assistance: 4: Min guard Ambulation Distance  (Feet): 120 Feet Assistive device: Rolling walker Ambulation/Gait Assistance Details: Cues for terminal knee extension & encouragement to increase RLE WBing.    Rt foot remains pointed outwards.   Gait Pattern: Step-through pattern;Decreased weight shift to right;Antalgic;Decreased step length - left;Decreased stride length Stairs: No    Exercises Total Joint Exercises Ankle Circles/Pumps: AROM;Both;10 reps Quad Sets: AROM;Both;10 reps Heel Slides: AAROM;Right;10 reps Straight Leg Raises: AAROM;10 reps;Right Knee Flexion: AAROM;Right;5 reps Goniometric ROM: AAROM Rt knee flexion ~50 degrees sitting     PT Goals (current goals can now be found in the care plan section) Acute Rehab PT Goals Patient Stated Goal: go home PT Goal Formulation: With patient Time For Goal Achievement: 01/27/13 Potential to Achieve Goals: Good  Visit Information  Last PT Received On: 01/22/13 Assistance Needed: +1 History of Present Illness: s/p RTKA    Subjective Data  Patient Stated Goal: go home   Cognition  Cognition Arousal/Alertness: Awake/alert Behavior During Therapy: WFL for tasks assessed/performed Overall Cognitive Status: Within Functional Limits for tasks assessed    Balance     End of Session PT - End of Session Equipment Utilized During Treatment: Gait belt Activity Tolerance: Patient tolerated treatment well Patient left: in chair;with call bell/phone within reach;with family/visitor present Nurse Communication: Mobility status CPM Right Knee CPM Right Knee: Off Right Knee Flexion (Degrees): 50   GP     Lara Mulch 01/22/2013, 11:51 AM   Verdell Face, PTA [phone] 01/22/2013

## 2013-01-22 NOTE — Progress Notes (Signed)
Occupational Therapy Treatment Patient Details Name: [name] MRN: [id number] DOB: Jun 13, 1930 Today's Date: 01/22/2013 Time: 7829-5621 OT Time Calculation (min): 26 min  OT Assessment / Plan / Recommendation  History of present illness s/p RTKA   OT comments  Patient reports no pain prior to activity and did not c/o pain during session which included bed mobility, bed, toilet and recliner transfers, sponge bath at sink in stand and sit, and functional mobility with walker in his room.  Patient is supervision to min assist with above BADL and functional mobility tasks.  No family present.   Follow Up Recommendations  Home health OT;SNF (making progress yet not Mod I for functional mobility and toileting needs)    Barriers to Discharge  24/7 supervision/assist  Equipment Recommendations  3 in 1 bedside comode    Frequency Min 2X/week   Progress towards OT Goals Progress towards OT goals: Progressing toward goals  Plan Discharge plan remains appropriate    Precautions / Restrictions Precautions Precautions: Knee;Fall Restrictions RLE Weight Bearing: Weight bearing as tolerated   Pertinent Vitals/Pain Reports no pain at rest and did not c/o pain during session.    ADL  Grooming: Performed;Set up;Supervision/safety Where Assessed - Grooming: Supported standing;Unsupported standing Upper Body Bathing: Set up;Performed;Supervision/safety Where Assessed - Upper Body Bathing: Supported standing Lower Body Bathing: Performed;Minimal assistance Where Assessed - Lower Body Bathing: Supported sit to stand;Supported standing;Unsupported sitting Lower Body Dressing: Performed;Moderate assistance;Simulated Where Assessed - Lower Body Dressing: Supported sit to stand;Supported standing;Unsupported sitting Toilet Transfer: Performed;Set up Toilet Transfer Method: Sit to stand;Stand pivot Toilet Transfer Equipment: Raised toilet seat with arms (or 3-in-1 over toilet) Toileting -  Clothing Manipulation and Hygiene: Simulated;Min guard Where Assessed - Toileting Clothing Manipulation and Hygiene: Standing;Sit on 3-in-1 or toilet;Sit to stand from 3-in-1 or toilet Transfers/Ambulation Related to ADLs: did not rely on grab bar today during toilet transfer, yet heavily relies on arm rest on all sitting surfaces, fewer vcs needed today for safety with RW. ADL Comments: Patient reports that he has some AE at home and he really hopes to go home if family can arrange for 24/7 supervision.  Yesterday, wife stated that she can look into having a grab bar installed next to commode to improve safety and independence    OT Goals(current goals can now be found in the care plan section) Acute Rehab OT Goals Patient Stated Goal: go home Potential to Achieve Goals: Good  Visit Information  Last OT Received On: 01/22/13 Assistance Needed: +1 History of Present Illness: s/p RTKA    Cognition  Cognition Arousal/Alertness: Awake/alert Behavior During Therapy: WFL for tasks assessed/performed Overall Cognitive Status: Within Functional Limits for tasks assessed    Mobility  Bed Mobility Supine to Sit: HOB flat;5: Supervision Sitting - Scoot to Edge of Bed: 5: Supervision Details for Bed Mobility Assistance: no cues needed Transfers Sit to Stand: From chair/3-in-1;With armrests;5: Supervision;From toilet Stand to Sit: To chair/3-in-1;With armrests;5: Supervision;To toilet Details for Transfer Assistance: Cues for hand placement and safety; Decr control of descent with stand to sit    End of Session OT - End of Session Equipment Utilized During Treatment: Rolling walker Activity Tolerance: Patient tolerated treatment well Patient left: in chair;with call bell/phone within reach  GO     [name] 01/22/2013, 10:49 AM

## 2013-01-22 NOTE — Progress Notes (Signed)
Orthopedic Tech Progress Note Patient Details:  [name] 03-29-1930 [id number] On cpm at 8:00 pm RLE 0-35 Patient ID: [name], male   DOB: January 05, 1931, 79 y.o.   MRN: [id number]   [name] 01/22/2013, 8:04 PM

## 2013-01-22 NOTE — Progress Notes (Signed)
[name]  MRN: [id number]  DOB/AGE:  77-Apr-1932 / 79 y.o.  2 Days Post-Op Procedure(s) (LRB): RIGHT TOTAL KNEE ARTHROPLASTY (Right)    PROGRESS NOTE Subjective: Patient is alert, oriented, no Nausea, no Vomiting, yes passing gas, no Bowel Movement. Taking PO well. Denies SOB, Chest or Calf Pain. Using Incentive Spirometer, PAS in place. Ambulate WBAT, CPM 0-30 Patient reports pain as mild  .    Objective: Vital signs in last 24 hours: Filed Vitals:   01/21/13 0616 01/21/13 1327 01/21/13 2116 01/22/13 0557  BP: 108/62 115/71 150/88 146/82  Pulse: 95 89 112 82  Temp: 99.4 F (37.4 C) 98.9 F (37.2 C) 99.5 F (37.5 C) 98.9 F (37.2 C)  TempSrc: Oral Oral    Resp: 16 18 20 18   SpO2: 92% 98% 99% 99%      Intake/Output from previous day: I/O last 3 completed shifts: In: 3646.8 [P.O.:1080; I.V.:2566.8] Out: 1350 [Urine:1350]   Intake/Output this shift:     LABORATORY DATA:  Recent Labs  01/21/13 0540 01/22/13 0456  WBC 9.9 10.2  HGB 10.7* 10.6*  HCT 31.5* 30.3*  PLT 181 151    Examination: Neurologically intact ABD soft Neurovascular intact Sensation intact distally Intact pulses distally Dorsiflexion/Plantar flexion intact Incision: dressing C/D/I No cellulitis present Compartment soft}  Assessment:   2 Days Post-Op Procedure(s) (LRB): RIGHT TOTAL KNEE ARTHROPLASTY (Right) ADDITIONAL DIAGNOSIS:  Hypertension  Plan: PT/OT WBAT, CPM 5/hrs day until ROM 0-90 degrees, then D/C CPM DVT Prophylaxis:  SCDx72hrs, ASA 325 mg BID x 2 weeks DISCHARGE PLAN: Pt is trying to decide if he wants to go home.  [name] would like pt to go to SNF.  We will talk withj Social Work later today and D?C home if appropriate and pt has met PT goals.  IF going to SNF, pt will likely have to stay one more night. DISCHARGE NEEDS: HHPT, HHRN, CPM, Walker and 3-in-1 comode seat     [name] 01/22/2013, 7:45 AM

## 2013-01-23 ENCOUNTER — Encounter (HOSPITAL_COMMUNITY): Payer: Self-pay | Admitting: General Practice

## 2013-01-23 DIAGNOSIS — M1710 Unilateral primary osteoarthritis, unspecified knee: Secondary | ICD-10-CM | POA: Diagnosis not present

## 2013-01-23 DIAGNOSIS — I10 Essential (primary) hypertension: Secondary | ICD-10-CM | POA: Diagnosis not present

## 2013-01-23 DIAGNOSIS — S8990XA Unspecified injury of unspecified lower leg, initial encounter: Secondary | ICD-10-CM | POA: Diagnosis not present

## 2013-01-23 DIAGNOSIS — Z96659 Presence of unspecified artificial knee joint: Secondary | ICD-10-CM | POA: Diagnosis not present

## 2013-01-23 DIAGNOSIS — D649 Anemia, unspecified: Secondary | ICD-10-CM | POA: Diagnosis not present

## 2013-01-23 DIAGNOSIS — Z5189 Encounter for other specified aftercare: Secondary | ICD-10-CM | POA: Diagnosis not present

## 2013-01-23 DIAGNOSIS — E785 Hyperlipidemia, unspecified: Secondary | ICD-10-CM | POA: Diagnosis not present

## 2013-01-23 DIAGNOSIS — H9110 Presbycusis, unspecified ear: Secondary | ICD-10-CM | POA: Diagnosis not present

## 2013-01-23 DIAGNOSIS — Z471 Aftercare following joint replacement surgery: Secondary | ICD-10-CM | POA: Diagnosis not present

## 2013-01-23 DIAGNOSIS — K5730 Diverticulosis of large intestine without perforation or abscess without bleeding: Secondary | ICD-10-CM | POA: Diagnosis not present

## 2013-01-23 DIAGNOSIS — I714 Abdominal aortic aneurysm, without rupture: Secondary | ICD-10-CM | POA: Diagnosis not present

## 2013-01-23 DIAGNOSIS — N400 Enlarged prostate without lower urinary tract symptoms: Secondary | ICD-10-CM | POA: Diagnosis not present

## 2013-01-23 DIAGNOSIS — M25569 Pain in unspecified knee: Secondary | ICD-10-CM | POA: Diagnosis not present

## 2013-01-23 LAB — CBC
Platelets: 138 10*3/uL — ABNORMAL LOW (ref 150–400)
RDW: 12.7 % (ref 11.5–15.5)
WBC: 8.1 10*3/uL (ref 4.0–10.5)

## 2013-01-23 NOTE — Clinical Social Work Psychosocial (Signed)
Clinical Social Work Department  BRIEF PSYCHOSOCIAL ASSESSMENT  Patient: [name] Account Number: [id number] Admit date: 01/20/13 Clinical Social Worker Akai Dollard Riley Kill, MSW Date/Time: 110/28/1411:30 AM  Referred by: Physician Date Referred:  Referred for   SNF Placement   Other Referral:  Interview type: Patient  Other interview type:  PSYCHOSOCIAL DATA  Living Status: wife  Admitted from facility:  Level of care:  Primary support name: [name],[name]  Primary support relationship to patient: Spouse  Degree of support available:  Strong and vested   CURRENT CONCERNS  Current Concerns   Post-Acute Placement   Other Concerns:  SOCIAL WORK ASSESSMENT / PLAN  CSW met with pt re: PT recommendation for SNF.   Pt lives with his spouse   CSW explained placement process and answered questions.   Pt reports [facility] as his preference   CSW completed FL2 and initiated SNF search.     Assessment/plan status: Information/Referral to [facility]  Other assessment/ plan:  Information/referral to community resources:  SNF   PTAR  PATIENT'S/FAMILY'S RESPONSE TO PLAN OF CARE:  Pt reports he is agreeable to ST SNF in order to increase strength and independence with mobility prior to returning home Pt verbalized understanding of placement process and appreciation for CSW assist.   Sabino Niemann, MSW, LCSWA [phone]

## 2013-01-23 NOTE — Progress Notes (Signed)
CSW (Clinical Child psychotherapist) spoke with [name] at [facility] who confirmed pt has a bed available today and requested pt be transferred to facility after lunch. CSW spoke with pt and pt wife and informed. Non-emergent ambulance transport has been arranged for 2:00pm. Pt nurse notified. CSW signing off.  [name], LCSWA [phone]

## 2013-01-23 NOTE — Progress Notes (Signed)
Physical Therapy Treatment Patient Details Name: [name] MRN: [id number] DOB: 1930-04-18 Today's Date: 01/23/2013 Time: 8295-6213 PT Time Calculation (min): 28 min  PT Assessment / Plan / Recommendation  History of Present Illness s/p RTKA   PT Comments   Pt cont's to make progress with mobility.  [name] present.  Per chart review & wife plans are for pt to d/c to SNF today.     Follow Up Recommendations  SNF     Does the patient have the potential to tolerate intense rehabilitation     Barriers to Discharge        Equipment Recommendations  Rolling walker with 5" wheels;3in1 (PT)    Recommendations for Other Services    Frequency 7X/week   Progress towards PT Goals Progress towards PT goals: Progressing toward goals  Plan Current plan remains appropriate    Precautions / Restrictions Precautions Precautions: Knee;Fall Restrictions RLE Weight Bearing: Weight bearing as tolerated   Pertinent Vitals/Pain "it's ok" in response to asking about pain.      Mobility  Bed Mobility Bed Mobility: Not assessed Transfers Transfers: Sit to Stand;Stand to Sit Sit to Stand: 4: Min guard;With upper extremity assist;With armrests;From chair/3-in-1;From bed Stand to Sit: 4: Min guard;With upper extremity assist;With armrests;To bed;To chair/3-in-1 Details for Transfer Assistance: cues to reinforce safe hand placement Ambulation/Gait Ambulation/Gait Assistance: 4: Min guard Ambulation Distance (Feet): 150 Feet Assistive device: Rolling walker Ambulation/Gait Assistance Details: encouragement to turn Rt foot/knee in nuetral position (rotates outwards) Gait Pattern: Step-through pattern;Decreased stride length;Decreased weight shift to right General Gait Details: decreased WBing through RLE as distance increases Stairs: No Wheelchair Mobility Wheelchair Mobility: No    Exercises Total Joint Exercises Ankle Circles/Pumps: AROM;Both;10 reps Quad Sets:  AROM;Strengthening;Both;10 reps Heel Slides: AAROM;Strengthening;Right;10 reps Straight Leg Raises: AAROM;Strengthening;Right;10 reps Long Arc Quad: AROM;Strengthening;Right;10 reps Knee Flexion: AAROM;Right;5 reps;Seated     PT Goals (current goals can now be found in the care plan section) Acute Rehab PT Goals PT Goal Formulation: With patient Time For Goal Achievement: 01/27/13 Potential to Achieve Goals: Good  Visit Information  Last PT Received On: 01/23/13 Assistance Needed: +1 History of Present Illness: s/p RTKA    Subjective Data      Cognition  Cognition Arousal/Alertness: Awake/alert Behavior During Therapy: WFL for tasks assessed/performed Overall Cognitive Status: Within Functional Limits for tasks assessed    Balance     End of Session PT - End of Session Equipment Utilized During Treatment: Gait belt Activity Tolerance: Patient tolerated treatment well Patient left: in chair;with call bell/phone within reach;with family/visitor present Nurse Communication: Mobility status CPM Right Knee CPM Right Knee: Off   GP     Lara Mulch 01/23/2013, 11:41 AM   Verdell Face, PTA [phone] 01/23/2013

## 2013-01-23 NOTE — Discharge Summary (Signed)
[name] MRN: [id number] DOB/AGE: 77-Apr-1932 77 y.o.  Admit date: 01/20/2013 Discharge date: 01/23/2013  Admission Diagnoses:  Principal Problem:   Arthritis of knee, right   Discharge Diagnoses:  Same  Past Medical History  Diagnosis Date  . AAA (abdominal aortic aneurysm)   . Staph skin infection     Back  . Immune disorder   . Myocardial infarction 2000  . Shortness of breath   . Arthritis     ra  . Hypertension     dr Verdis Prime    Surgeries: Procedure(s): RIGHT TOTAL KNEE ARTHROPLASTY on 01/20/2013   Consultants:    Discharged Condition: Improved  Hospital Course: [name] is an 79 y.o. male who was admitted 01/20/2013 for operative treatment ofArthritis of knee, right. Patient has severe unremitting pain that affects sleep, daily activities, and work/hobbies. After pre-op clearance the patient was taken to the operating room on 01/20/2013 and underwent  Procedure(s): RIGHT TOTAL KNEE ARTHROPLASTY.    Patient was given perioperative antibiotics: Anti-infectives   Start     Dose/Rate Route Frequency Ordered Stop   01/20/13 1500  hydroxychloroquine (PLAQUENIL) tablet 200 mg     200 mg Oral 2 times daily 01/20/13 1358     01/20/13 0956  cefUROXime (ZINACEF) injection  Status:  Discontinued       As needed 01/20/13 0956 01/20/13 1128   01/20/13 0600  ceFAZolin (ANCEF) IVPB 2 g/50 mL premix     2 g 100 mL/hr over 30 Minutes Intravenous On call to O.R. 01/19/13 1618 01/20/13 0920       Patient was given sequential compression devices, early ambulation, and chemoprophylaxis to prevent DVT. Prior to discharge she was able to ambulate 120 feet dressing change showed the wound to be clean and dry with no erythema and no increased warmth there  Patient benefited maximally from hospital stay and there were no complications.    Recent vital signs: Patient Vitals for the past 24 hrs:  BP Temp Temp src Pulse Resp SpO2  01/23/13 0502 118/63  mmHg 98.7 F (37.1 C) Oral 104 18 99 %  01/22/13 2132 124/68 mmHg - - 112 - -  01/22/13 2033 105/67 mmHg 98.4 F (36.9 C) Oral 110 18 98 %  01/22/13 1405 105/61 mmHg 98.7 F (37.1 C) Oral 97 18 97 %  01/22/13 1332 102/62 mmHg 98.8 F (37.1 C) Oral 68 18 96 %  01/22/13 1020 107/69 mmHg - - 64 - -  01/22/13 1013 100/69 mmHg - - - - -     Recent laboratory studies:  Recent Labs  01/22/13 0456 01/23/13 0550  WBC 10.2 8.1  HGB 10.6* 9.8*  HCT 30.3* 27.7*  PLT 151 138*     Discharge Medications:     Medication List    STOP taking these medications       acetaminophen 500 MG tablet  Commonly known as:  TYLENOL     aspirin 81 MG chewable tablet  Replaced by:  aspirin EC 325 MG tablet      TAKE these medications       aspirin EC 325 MG tablet  Take 1 tablet (325 mg total) by mouth 2 (two) times daily.     atorvastatin 40 MG tablet  Commonly known as:  LIPITOR  Take 40 mg by mouth daily.     calcium carbonate 600 MG Tabs tablet  Commonly known as:  OS-CAL  Take 600 mg by mouth daily.  ezetimibe 10 MG tablet  Commonly known as:  ZETIA  Take 10 mg by mouth daily.     FLOMAX 0.4 MG Caps capsule  Generic drug:  tamsulosin  Take 0.4 mg by mouth daily.     hydroxychloroquine 200 MG tablet  Commonly known as:  PLAQUENIL  Take 200 mg by mouth 2 (two) times daily.     methocarbamol 500 MG tablet  Commonly known as:  ROBAXIN  Take 1 tablet (500 mg total) by mouth 2 (two) times daily with a meal.     metoprolol tartrate 25 MG tablet  Commonly known as:  LOPRESSOR  Take 25 mg by mouth 2 (two) times daily.     multivitamin capsule  Take 1 capsule by mouth daily.     nitroGLYCERIN 0.4 MG SL tablet  Commonly known as:  NITROSTAT  Place 0.4 mg under the tongue every 5 (five) minutes as needed for chest pain.     OCUVITE PRESERVISION PO  Take 1 tablet by mouth daily.     omeprazole 20 MG capsule  Commonly known as:  PRILOSEC  Take 20 mg by mouth daily.      oxyCODONE-acetaminophen 5-325 MG per tablet  Commonly known as:  ROXICET  Take 1 tablet by mouth every 4 (four) hours as needed for pain.     predniSONE 5 MG tablet  Commonly known as:  DELTASONE  Take 5 mg by mouth daily.     ramipril 2.5 MG tablet  Commonly known as:  ALTACE  Take 2.5 mg by mouth daily.     sulfaSALAzine 500 MG tablet  Commonly known as:  AZULFIDINE  Take 1,000 mg by mouth 2 (two) times daily.     SYSTANE BALANCE OP  Place 1 drop into both eyes 2 (two) times daily.     VOLTAREN 1 % Gel  Generic drug:  diclofenac sodium  Apply 1 application topically 2 (two) times daily as needed (joint/muscle pain).        Diagnostic Studies: Dg Chest 2 View  01/13/2013   CLINICAL DATA:  Preop for right hip arthroplasty  EXAM: CHEST  2 VIEW  COMPARISON:  07/08/2010 and 07/03/2010  FINDINGS: Cardiomediastinal silhouette is stable. Mild dextroscoliosis thoracic spine again noted. Stable chronic interstitial prominence and peripheral probable fibrotic changes especially lung bases and lower lobes. No definite superimposed infiltrate or pulmonary edema. Osteopenia and mild degenerative changes thoracic spine.  IMPRESSION: Mild dextroscoliosis thoracic spine again noted. Stable chronic interstitial prominence and peripheral probable fibrotic changes especially lung bases and lower lobes. No definite superimposed infiltrate or pulmonary edema. Osteopenia and mild degenerative changes thoracic spine.   Electronically Signed   By: Natasha Mead M.D.   On: 01/13/2013 10:37    Disposition: 01-Home or Self Care       Future Appointments Provider Department Dept Phone   05/05/2013 9:00 AM Carma Lair Nickel, NP [facility] [phone]      Follow-up Information   Follow up with [name], MD In 2 weeks.   Specialty:  Orthopedic Surgery   Contact information:   [address] [place] [address] [phone]        Signed: [name] 01/23/2013, 8:11 AM

## 2013-01-23 NOTE — Progress Notes (Signed)
[name], male   DOB: [birth year], 79 y.o.   MRN: [id number] [name]  MRN: [id number]  DOB/AGE:  [birth year] / 79 y.o.  3 Days Post-Op Procedure(s) (LRB): RIGHT TOTAL KNEE ARTHROPLASTY (Right)    PROGRESS NOTE Subjective: Patient is alert, oriented, no Nausea, no Vomiting, yes passing gas, no Bowel Movement. Taking PO well. Denies SOB, Chest or Calf Pain. Using Incentive Spirometer, PAS in place. Ambulate WBAT patient walked 120 feet yesterday, CPM 0-80 Patient reports pain as 3 on 0-10 scale  .    Objective: Vital signs in last 24 hours: Filed Vitals:   01/22/13 1405 01/22/13 2033 01/22/13 2132 01/23/13 0502  BP: 105/61 105/67 124/68 118/63  Pulse: 97 110 112 104  Temp: 98.7 F (37.1 C) 98.4 F (36.9 C)  98.7 F (37.1 C)  TempSrc: Oral Oral  Oral  Resp: 18 18  18   SpO2: 97% 98%  99%      Intake/Output from previous day: I/O last 3 completed shifts: In: 1160 [P.O.:960; I.V.:200] Out: 1725 [Urine:1725]   Intake/Output this shift:     LABORATORY DATA:  Recent Labs  01/22/13 0456 01/23/13 0550  WBC 10.2 8.1  HGB 10.6* 9.8*  HCT 30.3* 27.7*  PLT 151 138*    Examination: Neurologically intact ABD soft Neurovascular intact Sensation intact distally Intact pulses distally Dorsiflexion/Plantar flexion intact Incision: no drainage No cellulitis present Compartment soft} Dressing changed by me today wound was clean and dry Assessment:   3 Days Post-Op Procedure(s) (LRB): RIGHT TOTAL KNEE ARTHROPLASTY (Right) ADDITIONAL DIAGNOSIS:  Hypertension  Plan: PT/OT WBAT, CPM 5/hrs day until ROM 0-90 degrees, then D/C CPM DVT Prophylaxis:  SCDx72hrs, ASA 325 mg BID x 2 weeks DISCHARGE PLAN: Skilled Nursing Facility/Rehab, family has a preference for [facility] nursing facility and has actually contacted them. Patient should be available for transfer today appear DISCHARGE NEEDS: HHPT, HHRN, CPM, Walker and 3-in-1 comode seat      Cathline Dowen J 01/23/2013, 8:07 AM

## 2013-01-23 NOTE — Progress Notes (Signed)
Patient d/c to SNF, IV removed, report has been called.

## 2013-01-24 DIAGNOSIS — Z471 Aftercare following joint replacement surgery: Secondary | ICD-10-CM | POA: Diagnosis not present

## 2013-01-24 DIAGNOSIS — D649 Anemia, unspecified: Secondary | ICD-10-CM | POA: Diagnosis not present

## 2013-01-24 DIAGNOSIS — I10 Essential (primary) hypertension: Secondary | ICD-10-CM | POA: Diagnosis not present

## 2013-01-30 DIAGNOSIS — I10 Essential (primary) hypertension: Secondary | ICD-10-CM | POA: Diagnosis not present

## 2013-01-30 DIAGNOSIS — M159 Polyosteoarthritis, unspecified: Secondary | ICD-10-CM | POA: Diagnosis not present

## 2013-01-30 DIAGNOSIS — IMO0001 Reserved for inherently not codable concepts without codable children: Secondary | ICD-10-CM | POA: Diagnosis not present

## 2013-01-30 DIAGNOSIS — Z96659 Presence of unspecified artificial knee joint: Secondary | ICD-10-CM | POA: Diagnosis not present

## 2013-01-30 DIAGNOSIS — Z471 Aftercare following joint replacement surgery: Secondary | ICD-10-CM | POA: Diagnosis not present

## 2013-01-31 DIAGNOSIS — Z96659 Presence of unspecified artificial knee joint: Secondary | ICD-10-CM | POA: Diagnosis not present

## 2013-01-31 DIAGNOSIS — M159 Polyosteoarthritis, unspecified: Secondary | ICD-10-CM | POA: Diagnosis not present

## 2013-01-31 DIAGNOSIS — IMO0001 Reserved for inherently not codable concepts without codable children: Secondary | ICD-10-CM | POA: Diagnosis not present

## 2013-01-31 DIAGNOSIS — Z471 Aftercare following joint replacement surgery: Secondary | ICD-10-CM | POA: Diagnosis not present

## 2013-01-31 DIAGNOSIS — I10 Essential (primary) hypertension: Secondary | ICD-10-CM | POA: Diagnosis not present

## 2013-02-04 DIAGNOSIS — M25569 Pain in unspecified knee: Secondary | ICD-10-CM | POA: Diagnosis not present

## 2013-02-04 DIAGNOSIS — IMO0001 Reserved for inherently not codable concepts without codable children: Secondary | ICD-10-CM | POA: Diagnosis not present

## 2013-02-04 DIAGNOSIS — Z96659 Presence of unspecified artificial knee joint: Secondary | ICD-10-CM | POA: Diagnosis not present

## 2013-02-04 DIAGNOSIS — M159 Polyosteoarthritis, unspecified: Secondary | ICD-10-CM | POA: Diagnosis not present

## 2013-02-04 DIAGNOSIS — Z471 Aftercare following joint replacement surgery: Secondary | ICD-10-CM | POA: Diagnosis not present

## 2013-02-04 DIAGNOSIS — I10 Essential (primary) hypertension: Secondary | ICD-10-CM | POA: Diagnosis not present

## 2013-02-05 DIAGNOSIS — Z96659 Presence of unspecified artificial knee joint: Secondary | ICD-10-CM | POA: Diagnosis not present

## 2013-02-05 DIAGNOSIS — M159 Polyosteoarthritis, unspecified: Secondary | ICD-10-CM | POA: Diagnosis not present

## 2013-02-05 DIAGNOSIS — I10 Essential (primary) hypertension: Secondary | ICD-10-CM | POA: Diagnosis not present

## 2013-02-05 DIAGNOSIS — Z471 Aftercare following joint replacement surgery: Secondary | ICD-10-CM | POA: Diagnosis not present

## 2013-02-05 DIAGNOSIS — IMO0001 Reserved for inherently not codable concepts without codable children: Secondary | ICD-10-CM | POA: Diagnosis not present

## 2013-02-07 DIAGNOSIS — IMO0001 Reserved for inherently not codable concepts without codable children: Secondary | ICD-10-CM | POA: Diagnosis not present

## 2013-02-07 DIAGNOSIS — Z96659 Presence of unspecified artificial knee joint: Secondary | ICD-10-CM | POA: Diagnosis not present

## 2013-02-07 DIAGNOSIS — I10 Essential (primary) hypertension: Secondary | ICD-10-CM | POA: Diagnosis not present

## 2013-02-07 DIAGNOSIS — Z471 Aftercare following joint replacement surgery: Secondary | ICD-10-CM | POA: Diagnosis not present

## 2013-02-07 DIAGNOSIS — M159 Polyosteoarthritis, unspecified: Secondary | ICD-10-CM | POA: Diagnosis not present

## 2013-02-10 DIAGNOSIS — Z96659 Presence of unspecified artificial knee joint: Secondary | ICD-10-CM | POA: Diagnosis not present

## 2013-02-10 DIAGNOSIS — I10 Essential (primary) hypertension: Secondary | ICD-10-CM | POA: Diagnosis not present

## 2013-02-10 DIAGNOSIS — M159 Polyosteoarthritis, unspecified: Secondary | ICD-10-CM | POA: Diagnosis not present

## 2013-02-10 DIAGNOSIS — IMO0001 Reserved for inherently not codable concepts without codable children: Secondary | ICD-10-CM | POA: Diagnosis not present

## 2013-02-10 DIAGNOSIS — Z471 Aftercare following joint replacement surgery: Secondary | ICD-10-CM | POA: Diagnosis not present

## 2013-02-12 DIAGNOSIS — Z471 Aftercare following joint replacement surgery: Secondary | ICD-10-CM | POA: Diagnosis not present

## 2013-02-12 DIAGNOSIS — M159 Polyosteoarthritis, unspecified: Secondary | ICD-10-CM | POA: Diagnosis not present

## 2013-02-12 DIAGNOSIS — Z96659 Presence of unspecified artificial knee joint: Secondary | ICD-10-CM | POA: Diagnosis not present

## 2013-02-12 DIAGNOSIS — I10 Essential (primary) hypertension: Secondary | ICD-10-CM | POA: Diagnosis not present

## 2013-02-12 DIAGNOSIS — IMO0001 Reserved for inherently not codable concepts without codable children: Secondary | ICD-10-CM | POA: Diagnosis not present

## 2013-02-14 DIAGNOSIS — Z96659 Presence of unspecified artificial knee joint: Secondary | ICD-10-CM | POA: Diagnosis not present

## 2013-02-14 DIAGNOSIS — M159 Polyosteoarthritis, unspecified: Secondary | ICD-10-CM | POA: Diagnosis not present

## 2013-02-14 DIAGNOSIS — Z471 Aftercare following joint replacement surgery: Secondary | ICD-10-CM | POA: Diagnosis not present

## 2013-02-14 DIAGNOSIS — IMO0001 Reserved for inherently not codable concepts without codable children: Secondary | ICD-10-CM | POA: Diagnosis not present

## 2013-02-14 DIAGNOSIS — I10 Essential (primary) hypertension: Secondary | ICD-10-CM | POA: Diagnosis not present

## 2013-02-26 DIAGNOSIS — E782 Mixed hyperlipidemia: Secondary | ICD-10-CM | POA: Diagnosis not present

## 2013-02-26 DIAGNOSIS — I359 Nonrheumatic aortic valve disorder, unspecified: Secondary | ICD-10-CM | POA: Diagnosis not present

## 2013-02-26 DIAGNOSIS — Z79899 Other long term (current) drug therapy: Secondary | ICD-10-CM | POA: Diagnosis not present

## 2013-02-26 DIAGNOSIS — Z0000 Encounter for general adult medical examination without abnormal findings: Secondary | ICD-10-CM | POA: Diagnosis not present

## 2013-02-26 DIAGNOSIS — I10 Essential (primary) hypertension: Secondary | ICD-10-CM | POA: Diagnosis not present

## 2013-02-26 DIAGNOSIS — Z1331 Encounter for screening for depression: Secondary | ICD-10-CM | POA: Diagnosis not present

## 2013-03-12 ENCOUNTER — Other Ambulatory Visit (HOSPITAL_COMMUNITY): Payer: Self-pay | Admitting: Geriatric Medicine

## 2013-03-12 ENCOUNTER — Ambulatory Visit (HOSPITAL_COMMUNITY): Payer: Medicare Other | Attending: Geriatric Medicine | Admitting: Cardiology

## 2013-03-12 ENCOUNTER — Encounter: Payer: Self-pay | Admitting: Cardiology

## 2013-03-12 DIAGNOSIS — I359 Nonrheumatic aortic valve disorder, unspecified: Secondary | ICD-10-CM | POA: Insufficient documentation

## 2013-03-12 NOTE — Progress Notes (Signed)
Echo performed. 

## 2013-03-14 DIAGNOSIS — I2510 Atherosclerotic heart disease of native coronary artery without angina pectoris: Secondary | ICD-10-CM | POA: Diagnosis not present

## 2013-03-14 DIAGNOSIS — IMO0002 Reserved for concepts with insufficient information to code with codable children: Secondary | ICD-10-CM | POA: Diagnosis not present

## 2013-03-14 DIAGNOSIS — H04569 Stenosis of unspecified lacrimal punctum: Secondary | ICD-10-CM | POA: Diagnosis not present

## 2013-03-14 DIAGNOSIS — H02119 Cicatricial ectropion of unspecified eye, unspecified eyelid: Secondary | ICD-10-CM | POA: Diagnosis not present

## 2013-03-14 DIAGNOSIS — H02059 Trichiasis without entropian unspecified eye, unspecified eyelid: Secondary | ICD-10-CM | POA: Diagnosis not present

## 2013-03-14 HISTORY — PX: EYE SURGERY: SHX253

## 2013-03-21 DIAGNOSIS — IMO0002 Reserved for concepts with insufficient information to code with codable children: Secondary | ICD-10-CM | POA: Diagnosis not present

## 2013-03-21 DIAGNOSIS — H02109 Unspecified ectropion of unspecified eye, unspecified eyelid: Secondary | ICD-10-CM | POA: Diagnosis not present

## 2013-03-21 DIAGNOSIS — Z87891 Personal history of nicotine dependence: Secondary | ICD-10-CM | POA: Diagnosis not present

## 2013-03-21 DIAGNOSIS — Z961 Presence of intraocular lens: Secondary | ICD-10-CM | POA: Diagnosis not present

## 2013-03-21 DIAGNOSIS — Z8619 Personal history of other infectious and parasitic diseases: Secondary | ICD-10-CM | POA: Diagnosis not present

## 2013-03-21 DIAGNOSIS — Z7982 Long term (current) use of aspirin: Secondary | ICD-10-CM | POA: Diagnosis not present

## 2013-03-21 DIAGNOSIS — Z79899 Other long term (current) drug therapy: Secondary | ICD-10-CM | POA: Diagnosis not present

## 2013-03-21 DIAGNOSIS — Z48810 Encounter for surgical aftercare following surgery on the sense organs: Secondary | ICD-10-CM | POA: Diagnosis not present

## 2013-03-21 DIAGNOSIS — Z4802 Encounter for removal of sutures: Secondary | ICD-10-CM | POA: Diagnosis not present

## 2013-03-21 DIAGNOSIS — I10 Essential (primary) hypertension: Secondary | ICD-10-CM | POA: Diagnosis not present

## 2013-04-24 DIAGNOSIS — I10 Essential (primary) hypertension: Secondary | ICD-10-CM | POA: Diagnosis not present

## 2013-04-24 DIAGNOSIS — Z87891 Personal history of nicotine dependence: Secondary | ICD-10-CM | POA: Diagnosis not present

## 2013-04-24 DIAGNOSIS — IMO0002 Reserved for concepts with insufficient information to code with codable children: Secondary | ICD-10-CM | POA: Diagnosis not present

## 2013-04-24 DIAGNOSIS — Z48810 Encounter for surgical aftercare following surgery on the sense organs: Secondary | ICD-10-CM | POA: Diagnosis not present

## 2013-04-24 DIAGNOSIS — Z79899 Other long term (current) drug therapy: Secondary | ICD-10-CM | POA: Diagnosis not present

## 2013-04-24 DIAGNOSIS — M069 Rheumatoid arthritis, unspecified: Secondary | ICD-10-CM | POA: Diagnosis not present

## 2013-04-24 DIAGNOSIS — Z7982 Long term (current) use of aspirin: Secondary | ICD-10-CM | POA: Diagnosis not present

## 2013-04-24 DIAGNOSIS — I252 Old myocardial infarction: Secondary | ICD-10-CM | POA: Diagnosis not present

## 2013-05-02 ENCOUNTER — Encounter: Payer: Self-pay | Admitting: Family

## 2013-05-04 NOTE — Progress Notes (Signed)
[facility]   Established Abdominal Aortic Aneurysm  History of Present Illness  [name] is a 80 y.o. ([birth year]) male patient that Dr. Kellie Simmering has been monitoring for AAA and he presents today for: follow up of AAA.  Previous studies demonstrate an AAA, measuring 4.32 cm (05/01/2012).  The patient does have chronic back pain which wife attributes to generalized RA, denies abdominal pain.  The patient is not a smoker. The patient denies claudication in legs with walking. The patient denies history of stroke or TIA symptoms. [place], seem fairly physically active. He denies recent cough or feeling more tired than usual, but wife said he babysit's 3 grandchildren and the youngest has URI symptoms.  Pt Diabetic: No Pt smoker: non-smoker  Past Medical History  Diagnosis Date  . AAA (abdominal aortic aneurysm)   . Staph skin infection     Back  . Immune disorder   . Myocardial infarction 2000  . Shortness of breath   . Arthritis     ra  . Hypertension     dr Daneen Schick  . Hematuria, gross     HISTORY OF  ?  2004   Past Surgical History  Procedure Laterality Date  . Stents  2000  . Joint replacement      bil hips  . Back surgery    . Hand surgery      rel to staph  . Eye surgery    . Total knee arthroplasty Right 01/20/2013    Procedure: RIGHT TOTAL KNEE ARTHROPLASTY;  Surgeon: Kerin Salen, MD;  Location: [facility];  Service: Orthopedics;  Laterality: Right;  . Esophagogastroduodenoscopy  2004    ?   Social History History   Social History  . Marital Status: Married    Spouse Name: N/A    Number of Children: N/A  . Years of Education: N/A   Occupational History  . Not on file.   Social History Main Topics  . Smoking status: Former Smoker    Quit date: 03/27/1978  . Smokeless tobacco: Never Used  . Alcohol Use: No  . Drug Use: No  . Sexual Activity: Not on file   Other Topics Concern  . Not on file   Social History Narrative   . No narrative on file   Family History Family History  Problem Relation Age of Onset  . Heart disease Brother   . Stroke Brother   . Heart attack Brother     Current Outpatient Prescriptions on File Prior to Visit  Medication Sig Dispense Refill  . aspirin EC 325 MG tablet Take 1 tablet (325 mg total) by mouth 2 (two) times daily.  30 tablet  0  . atorvastatin (LIPITOR) 40 MG tablet Take 40 mg by mouth daily.      . calcium carbonate (OS-CAL) 600 MG TABS Take 600 mg by mouth daily.       . diclofenac sodium (VOLTAREN) 1 % GEL Apply 1 application topically 2 (two) times daily as needed (joint/muscle pain).       Marland Kitchen ezetimibe (ZETIA) 10 MG tablet Take 10 mg by mouth daily.        . hydroxychloroquine (PLAQUENIL) 200 MG tablet Take 200 mg by mouth 2 (two) times daily.       . methocarbamol (ROBAXIN) 500 MG tablet Take 1 tablet (500 mg total) by mouth 2 (two) times daily with a meal.  60 tablet  0  . metoprolol tartrate (LOPRESSOR) 25 MG  tablet Take 25 mg by mouth 2 (two) times daily.      . Multiple Vitamin (MULTIVITAMIN) capsule Take 1 capsule by mouth daily.        . Multiple Vitamins-Minerals (OCUVITE PRESERVISION PO) Take 1 tablet by mouth daily.      . nitroGLYCERIN (NITROSTAT) 0.4 MG SL tablet Place 0.4 mg under the tongue every 5 (five) minutes as needed for chest pain.      Marland Kitchen omeprazole (PRILOSEC) 20 MG capsule Take 20 mg by mouth daily.        Marland Kitchen oxyCODONE-acetaminophen (ROXICET) 5-325 MG per tablet Take 1 tablet by mouth every 4 (four) hours as needed for pain.  60 tablet  0  . predniSONE (DELTASONE) 5 MG tablet Take 5 mg by mouth daily.        Marland Kitchen Propylene Glycol (SYSTANE BALANCE OP) Place 1 drop into both eyes 2 (two) times daily.      . ramipril (ALTACE) 2.5 MG tablet Take 2.5 mg by mouth daily.        Marland Kitchen sulfaSALAzine (AZULFIDINE) 500 MG tablet Take 1,000 mg by mouth 2 (two) times daily.      . Tamsulosin HCl (FLOMAX) 0.4 MG CAPS Take 0.4 mg by mouth daily.        No current  facility-administered medications on file prior to visit.   Allergies  Allergen Reactions  . Morphine And Related Nausea And Vomiting and Other (See Comments)    Hallucinations     ROS: See HPI for pertinent positives and negatives.  Physical Examination Filed Vitals:   05/05/13 0951  BP: 110/84  Pulse: 72  Resp: 16   Filed Weights   05/05/13 0951  Weight: 197 lb (89.359 kg)  Body mass index is 27.86 kg/(m^2).   General: A&O x 3, WD, overweight male.  Pulmonary: Sym exp, good air movt, CTAB, mild rales in both bases, no rhonchi, or wheezing.  Cardiac: RRR, Nl S1, S2, no no detected murmur.   Carotid Bruits Left Right   Negative Negative   Aorta is mildly palpable Radial pulses are 2+ and =                          VASCULAR EXAM:                                                                                                         LE Pulses LEFT RIGHT       POPLITEAL  not palpable   not palpable       POSTERIOR TIBIAL  not palpable    palpable        DORSALIS PEDIS      ANTERIOR TIBIAL  palpable   palpable     Gastrointestinal: soft, NTND, -G/R, - HSM, - masses, - CVAT B.  Musculoskeletal: M/S 4/5 throughout, Extremities without ischemic changes.  Neurologic: CN 2-12 grossly intact, Motor exam as listed above.  Non-Invasive Vascular Imaging  AAA Duplex (05/05/2013)  Previous size: 4.32 cm (Date: 05/01/2012)  Current size:  4.42 cm (Date: 05/05/2013)  Medical Decision Making  The patient is a 81 y.o. male who presents with asymptomatic AAA with slight increase in size.  Mild rales noted in both lung bases, patient advised to notify his PCP should he feel worse, develop a cough or shortness of breath  Based on this patient's exam and diagnostic studies, the patient will follow up in 12 months with the following studies: AAA Duplex.  The threshold for repair is AAA size > 5.5 cm, growth > 1 cm/yr, and symptomatic status.  I emphasized the importance  of maximal medical management including strict control of blood pressure, blood glucose, and lipid levels, antiplatelet agents, obtaining regular exercise, and continued cessation of smoking.   The patient was given information about AAA including signs, symptoms, treatment, and how to minimize the risk of enlargement and rupture of aneurysms.    The patient was advised to call 911 should the patient experience sudden onset abdominal or back pain.   Thank you for allowing [place] to participate in this patient's care.  Clemon Chambers, RN, MSN, FNP-C [facility] [place] Office: [facility] Physician: Trula Slade  05/05/2013, 9:09 AM

## 2013-05-05 ENCOUNTER — Encounter: Payer: Self-pay | Admitting: Family

## 2013-05-05 ENCOUNTER — Ambulatory Visit (HOSPITAL_COMMUNITY)
Admission: RE | Admit: 2013-05-05 | Discharge: 2013-05-05 | Disposition: A | Discharge status: Home / Self Care | Payer: Medicare Other | Source: Ambulatory Visit | Attending: Family | Admitting: Family

## 2013-05-05 ENCOUNTER — Ambulatory Visit (INDEPENDENT_AMBULATORY_CARE_PROVIDER_SITE_OTHER): Payer: Medicare Other | Admitting: Family

## 2013-05-05 ENCOUNTER — Ambulatory Visit: Payer: Medicare Other | Admitting: Family

## 2013-05-05 VITALS — BP 110/84 | HR 72 | Resp 16 | Ht 70.5 in | Wt 197.0 lb

## 2013-05-05 DIAGNOSIS — I714 Abdominal aortic aneurysm, without rupture, unspecified: Secondary | ICD-10-CM

## 2013-05-05 DIAGNOSIS — I708 Atherosclerosis of other arteries: Secondary | ICD-10-CM | POA: Insufficient documentation

## 2013-05-05 NOTE — Patient Instructions (Signed)

## 2013-05-05 NOTE — Addendum Note (Signed)
Addended by: Dorthula Rue L on: 05/05/2013 02:31 PM   Modules accepted: Orders

## 2013-05-06 ENCOUNTER — Ambulatory Visit: Payer: Medicare Other | Admitting: Neurosurgery

## 2013-05-08 ENCOUNTER — Ambulatory Visit: Payer: Medicare Other | Admitting: Family

## 2013-06-03 DIAGNOSIS — IMO0002 Reserved for concepts with insufficient information to code with codable children: Secondary | ICD-10-CM | POA: Diagnosis not present

## 2013-06-03 DIAGNOSIS — Z79899 Other long term (current) drug therapy: Secondary | ICD-10-CM | POA: Diagnosis not present

## 2013-06-03 DIAGNOSIS — M2550 Pain in unspecified joint: Secondary | ICD-10-CM | POA: Diagnosis not present

## 2013-06-03 DIAGNOSIS — M069 Rheumatoid arthritis, unspecified: Secondary | ICD-10-CM | POA: Diagnosis not present

## 2013-07-10 DIAGNOSIS — M25559 Pain in unspecified hip: Secondary | ICD-10-CM | POA: Diagnosis not present

## 2013-07-10 DIAGNOSIS — M1710 Unilateral primary osteoarthritis, unspecified knee: Secondary | ICD-10-CM | POA: Diagnosis not present

## 2013-07-14 ENCOUNTER — Other Ambulatory Visit (HOSPITAL_COMMUNITY): Payer: Self-pay | Admitting: Orthopedic Surgery

## 2013-07-14 DIAGNOSIS — M25551 Pain in right hip: Secondary | ICD-10-CM

## 2013-07-24 ENCOUNTER — Ambulatory Visit (HOSPITAL_COMMUNITY)
Admission: RE | Admit: 2013-07-24 | Discharge: 2013-07-24 | Disposition: A | Discharge status: Home / Self Care | Payer: Medicare Other | Source: Ambulatory Visit | Attending: Orthopedic Surgery | Admitting: Orthopedic Surgery

## 2013-07-24 ENCOUNTER — Other Ambulatory Visit (HOSPITAL_COMMUNITY): Payer: Self-pay | Admitting: Orthopedic Surgery

## 2013-07-24 DIAGNOSIS — Z135 Encounter for screening for eye and ear disorders: Secondary | ICD-10-CM | POA: Diagnosis not present

## 2013-07-24 DIAGNOSIS — M25559 Pain in unspecified hip: Secondary | ICD-10-CM | POA: Insufficient documentation

## 2013-07-24 DIAGNOSIS — T1590XA Foreign body on external eye, part unspecified, unspecified eye, initial encounter: Secondary | ICD-10-CM

## 2013-07-24 DIAGNOSIS — M25551 Pain in right hip: Secondary | ICD-10-CM

## 2013-08-27 DIAGNOSIS — I10 Essential (primary) hypertension: Secondary | ICD-10-CM | POA: Diagnosis not present

## 2013-08-27 DIAGNOSIS — Z79899 Other long term (current) drug therapy: Secondary | ICD-10-CM | POA: Diagnosis not present

## 2013-08-27 DIAGNOSIS — E782 Mixed hyperlipidemia: Secondary | ICD-10-CM | POA: Diagnosis not present

## 2013-12-04 DIAGNOSIS — M069 Rheumatoid arthritis, unspecified: Secondary | ICD-10-CM | POA: Diagnosis not present

## 2013-12-04 DIAGNOSIS — Z79899 Other long term (current) drug therapy: Secondary | ICD-10-CM | POA: Diagnosis not present

## 2013-12-04 DIAGNOSIS — M545 Low back pain, unspecified: Secondary | ICD-10-CM | POA: Diagnosis not present

## 2013-12-04 DIAGNOSIS — IMO0002 Reserved for concepts with insufficient information to code with codable children: Secondary | ICD-10-CM | POA: Diagnosis not present

## 2013-12-04 DIAGNOSIS — M2550 Pain in unspecified joint: Secondary | ICD-10-CM | POA: Diagnosis not present

## 2014-01-01 ENCOUNTER — Encounter: Payer: Self-pay | Admitting: Interventional Cardiology

## 2014-01-21 DIAGNOSIS — Z23 Encounter for immunization: Secondary | ICD-10-CM | POA: Diagnosis not present

## 2014-01-23 ENCOUNTER — Ambulatory Visit: Payer: Medicare Other | Admitting: Interventional Cardiology

## 2014-01-27 DIAGNOSIS — M5440 Lumbago with sciatica, unspecified side: Secondary | ICD-10-CM | POA: Diagnosis not present

## 2014-01-27 DIAGNOSIS — M419 Scoliosis, unspecified: Secondary | ICD-10-CM | POA: Diagnosis not present

## 2014-01-29 ENCOUNTER — Telehealth: Payer: Self-pay | Admitting: Interventional Cardiology

## 2014-01-29 NOTE — Telephone Encounter (Signed)
Returned pt wife call. She reports that pt is scheduled to have an MRI of his back ordered by Raliegh Ip. They need to know specifically what type of stent the pt has. She sts that pt cardiac stent was done by Dr.Smith @ [facility] prior to the year 2000. Adv her that there was no info in pt chart prior to 2001.adv her to call [facility] medical records for further assistance she verbalized understanding.

## 2014-01-29 NOTE — Telephone Encounter (Signed)
New message           Pt is getting an MRI of his back and would like to know information about his stent that was put in years ago

## 2014-01-29 NOTE — Telephone Encounter (Signed)
Follow up ° ° ° ° ° °Returning Lisa's call °

## 2014-01-29 NOTE — Telephone Encounter (Signed)
returned pt wife call. she rqst a call back in 10-15 min

## 2014-02-08 DIAGNOSIS — M47816 Spondylosis without myelopathy or radiculopathy, lumbar region: Secondary | ICD-10-CM | POA: Diagnosis not present

## 2014-02-10 DIAGNOSIS — M4806 Spinal stenosis, lumbar region: Secondary | ICD-10-CM | POA: Diagnosis not present

## 2014-02-10 DIAGNOSIS — M4807 Spinal stenosis, lumbosacral region: Secondary | ICD-10-CM | POA: Diagnosis not present

## 2014-02-26 DIAGNOSIS — E785 Hyperlipidemia, unspecified: Secondary | ICD-10-CM | POA: Insufficient documentation

## 2014-02-26 DIAGNOSIS — I2510 Atherosclerotic heart disease of native coronary artery without angina pectoris: Secondary | ICD-10-CM | POA: Insufficient documentation

## 2014-02-26 DIAGNOSIS — I480 Paroxysmal atrial fibrillation: Secondary | ICD-10-CM | POA: Insufficient documentation

## 2014-02-26 NOTE — Progress Notes (Signed)
[name], male   DOB: May 19, 1930, 80 y.o.   MRN: [id number]    [address], [address] [place], [place]  [address] Phone: [phone] Fax:  [phone]  Date:  02/26/2014   ID:  [name], DOB Aug 09, 1930, MRN [id number]  PCP:  Mathews Argyle, MD   ASSESSMENT:  1. Coronary artery disease with bare-metal RCA stent during acute infarct 2001. Today he complains of occasional recurrences of chest tightness. He cannot specify how long he has been experiencing this 2. Hyperlipidemia 3. History of paroxysmal atrial fibrillation 4. Abdominal aortic aneurysm 5. Rheumatoid arthritis  PLAN:   1. Myocardial perfusion study with Lexa scan dress 2. Sublingual nitroglycerin prescription 3. Clinical follow-up in one year unless the myocardial perfusion study is moderate to high risk   SUBJECTIVE: [name] is a 80 y.o. male who is doing relatively well. He did stumble into the confession that he has had intermittent episodes of chest tightness. He says his episodes predominantly occurred spontaneously and last up to 10-15 minutes and then resolved. They're not severe in intensity. He has significant dyspnea on exertion. This is not new. He denies orthopnea. There is no peripheral edema. He has not had syncope or palpitations.   Wt Readings from Last 3 Encounters:  05/05/13 197 lb (89.359 kg)  01/13/13 196 lb 14.4 oz (89.313 kg)  05/01/12 194 lb (87.998 kg)     Past Medical History  Diagnosis Date  . AAA (abdominal aortic aneurysm)   . Staph skin infection     Back  . Immune disorder   . Myocardial infarction 2000  . Shortness of breath   . Arthritis     ra  . Hypertension     dr Daneen Schick  . Hematuria, gross     HISTORY OF  ?  2004  . Atrial fibrillation   . Coronary atherosclerosis     Current Outpatient Prescriptions  Medication Sig Dispense Refill  . aspirin EC 325 MG tablet Take 1 tablet (325 mg total) by mouth 2 (two) times daily.  30 tablet 0  . atorvastatin (LIPITOR) 40 MG tablet Take 40 mg by mouth daily.    . calcium carbonate (OS-CAL) 600 MG TABS Take 600 mg by mouth daily.     . diclofenac sodium (VOLTAREN) 1 % GEL Apply 1 application topically 2 (two) times daily as needed (joint/muscle pain).     Marland Kitchen ezetimibe (ZETIA) 10 MG tablet Take 10 mg by mouth daily.      . hydroxychloroquine (PLAQUENIL) 200 MG tablet Take 200 mg by mouth 2 (two) times daily.     . methocarbamol (ROBAXIN) 500 MG tablet Take 1 tablet (500 mg total) by mouth 2 (two) times daily with a meal. 60 tablet 0  . metoprolol tartrate (LOPRESSOR) 25 MG tablet Take 25 mg by mouth 2 (two) times daily.    . Multiple Vitamin (MULTIVITAMIN) capsule Take 1 capsule by mouth daily.      . Multiple Vitamins-Minerals (OCUVITE PRESERVISION PO) Take 1 tablet by mouth daily.    . nitroGLYCERIN (NITROSTAT) 0.4 MG SL tablet Place 0.4 mg under the tongue every 5 (five) minutes as needed for chest pain.    Marland Kitchen omeprazole (PRILOSEC) 20 MG capsule Take 20 mg by mouth daily.      Marland Kitchen oxyCODONE-acetaminophen (ROXICET) 5-325 MG per tablet Take 1 tablet by mouth every 4 (four) hours as needed for pain. 60 tablet 0  . predniSONE (DELTASONE) 5  MG tablet Take 5 mg by mouth daily.      Marland Kitchen Propylene Glycol (SYSTANE BALANCE OP) Place 1 drop into both eyes 2 (two) times daily.    . ramipril (ALTACE) 2.5 MG tablet Take 2.5 mg by mouth daily.      Marland Kitchen sulfaSALAzine (AZULFIDINE) 500 MG tablet Take 1,000 mg by mouth 2 (two) times daily.    . Tamsulosin HCl (FLOMAX) 0.4 MG CAPS Take 0.4 mg by mouth daily.      No current facility-administered medications for this visit.    Allergies:    Allergies  Allergen Reactions  . Morphine And Related Nausea And Vomiting and Other (See Comments)    Hallucinations   . Morphine Other (See Comments)    "makes me crazy"    Social History:  The patient  reports that he quit smoking about 35 years ago. He has never used smokeless tobacco. He reports that  he does not drink alcohol or use illicit drugs.   ROS:  Please see the history of present illness.   No transient neurological complaints. Denies orthopnea, claudication, stroke, blood in urine or stool, head trauma, and abdominal pain.   All other systems reviewed and negative.   OBJECTIVE: VS:  There were no vitals taken for this visit. Well nourished, well developed, in no acute distress, appears his stated age. Has obvious deformity from arthritis. HEENT: normal Neck: JVD flat. Carotid bruit absent  Cardiac:  normal S1, S2; RRR;  2/6 systolic murmur right upper sternal border. Soft 1/6 diastolic murmur. Lungs:  clear to auscultation bilaterally, no wheezing, rhonchi or rales Abd: soft, nontender, no hepatomegaly Ext: Edema absent. Pulses 2+ Skin: warm and dry Neuro:  CNs 2-12 intact, no focal abnormalities noted  EKG:  Normal sinus rhythm with evidence of prior inferior infarct       Signed, Illene Labrador III, MD 02/26/2014 6:14 PM

## 2014-02-27 ENCOUNTER — Encounter: Payer: Self-pay | Admitting: Interventional Cardiology

## 2014-02-27 ENCOUNTER — Ambulatory Visit (INDEPENDENT_AMBULATORY_CARE_PROVIDER_SITE_OTHER): Payer: Medicare Other | Admitting: Interventional Cardiology

## 2014-02-27 VITALS — BP 142/70 | HR 72 | Ht 70.5 in | Wt 194.8 lb

## 2014-02-27 DIAGNOSIS — E785 Hyperlipidemia, unspecified: Secondary | ICD-10-CM | POA: Diagnosis not present

## 2014-02-27 DIAGNOSIS — I714 Abdominal aortic aneurysm, without rupture, unspecified: Secondary | ICD-10-CM

## 2014-02-27 DIAGNOSIS — R0789 Other chest pain: Secondary | ICD-10-CM | POA: Diagnosis not present

## 2014-02-27 DIAGNOSIS — I2510 Atherosclerotic heart disease of native coronary artery without angina pectoris: Secondary | ICD-10-CM | POA: Diagnosis not present

## 2014-02-27 DIAGNOSIS — I480 Paroxysmal atrial fibrillation: Secondary | ICD-10-CM | POA: Diagnosis not present

## 2014-02-27 DIAGNOSIS — M47816 Spondylosis without myelopathy or radiculopathy, lumbar region: Secondary | ICD-10-CM | POA: Diagnosis not present

## 2014-02-27 MED ORDER — NITROGLYCERIN 0.4 MG SL SUBL: 0.4000 mg | SUBLINGUAL_TABLET | SUBLINGUAL | Status: DC | PRN

## 2014-02-27 NOTE — Patient Instructions (Signed)
Your physician has recommended you make the following change in your medication:  1) an Rx for Nitro-glycerin has been sent to your pharmacy.use as directed  Take all other medications as prescribed  Your physician has requested that you have a lexiscan myoview. For further information please visit [email or web address]. Please follow instruction sheet, as given.  Your physician wants you to follow-up in: 1 year with Dr.Smith You will receive a reminder letter in the mail two months in advance. If you don't receive a letter, please call our office to schedule the follow-up appointment.  Nitroglycerin sublingual tablets What is this medicine? NITROGLYCERIN (nye troe GLI ser in) is a type of vasodilator. It relaxes blood vessels, increasing the blood and oxygen supply to your heart. This medicine is used to relieve chest pain caused by angina. It is also used to prevent chest pain before activities like climbing stairs, going outdoors in cold weather, or sexual activity. This medicine may be used for other purposes; ask your health care provider or pharmacist if you have questions. COMMON BRAND NAME(S): Nitroquick, Nitrostat, Nitrotab What should I tell my health care provider before I take this medicine? They need to know if you have any of these conditions: -anemia -head injury, recent stroke, or bleeding in the brain -liver disease -previous heart attack -an unusual or allergic reaction to nitroglycerin, other medicines, foods, dyes, or preservatives -pregnant or trying to get pregnant -breast-feeding How should I use this medicine? Take this medicine by mouth as needed. At the first sign of an angina attack (chest pain or tightness) place one tablet under your tongue. You can also take this medicine 5 to 10 minutes before an event likely to produce chest pain. Follow the directions on the prescription label. Let the tablet dissolve under the tongue. Do not swallow whole. Replace the dose if  you accidentally swallow it. It will help if your mouth is not dry. Saliva around the tablet will help it to dissolve more quickly. Do not eat or drink, smoke or chew tobacco while a tablet is dissolving. If you are not better within 5 minutes after taking ONE dose of nitroglycerin, call 9-1-1 immediately to seek emergency medical care. Do not take more than 3 nitroglycerin tablets over 15 minutes. If you take this medicine often to relieve symptoms of angina, your doctor or health care professional may provide you with different instructions to manage your symptoms. If symptoms do not go away after following these instructions, it is important to call 9-1-1 immediately. Do not take more than 3 nitroglycerin tablets over 15 minutes. Talk to your pediatrician regarding the use of this medicine in children. Special care may be needed. Overdosage: If you think you have taken too much of this medicine contact a poison control center or emergency room at once. NOTE: This medicine is only for you. Do not share this medicine with others. What if I miss a dose? This does not apply. This medicine is only used as needed. What may interact with this medicine? Do not take this medicine with any of the following medications: -certain migraine medicines like ergotamine and dihydroergotamine (DHE) -medicines used to treat erectile dysfunction like sildenafil, tadalafil, and vardenafil -riociguat This medicine may also interact with the following medications: -alteplase -aspirin -heparin -medicines for high blood pressure -medicines for mental depression -other medicines used to treat angina -phenothiazines like chlorpromazine, mesoridazine, prochlorperazine, thioridazine This list may not describe all possible interactions. Give your health care provider a list of all the  medicines, herbs, non-prescription drugs, or dietary supplements you use. Also tell them if you smoke, drink alcohol, or use illegal drugs.  Some items may interact with your medicine. What should I watch for while using this medicine? Tell your doctor or health care professional if you feel your medicine is no longer working. Keep this medicine with you at all times. Sit or lie down when you take your medicine to prevent falling if you feel dizzy or faint after using it. Try to remain calm. This will help you to feel better faster. If you feel dizzy, take several deep breaths and lie down with your feet propped up, or bend forward with your head resting between your knees. You may get drowsy or dizzy. Do not drive, use machinery, or do anything that needs mental alertness until you know how this drug affects you. Do not stand or sit up quickly, especially if you are an older patient. This reduces the risk of dizzy or fainting spells. Alcohol can make you more drowsy and dizzy. Avoid alcoholic drinks. Do not treat yourself for coughs, colds, or pain while you are taking this medicine without asking your doctor or health care professional for advice. Some ingredients may increase your blood pressure. What side effects may I notice from receiving this medicine? Side effects that you should report to your doctor or health care professional as soon as possible: -blurred vision -dry mouth -skin rash -sweating -the feeling of extreme pressure in the head -unusually weak or tired Side effects that usually do not require medical attention (report to your doctor or health care professional if they continue or are bothersome): -flushing of the face or neck -headache -irregular heartbeat, palpitations -nausea, vomiting This list may not describe all possible side effects. Call your doctor for medical advice about side effects. You may report side effects to FDA at 1-800-FDA-1088. Where should I keep my medicine? Keep out of the reach of children. Store at room temperature between 20 and 25 degrees C (68 and 77 degrees F). Store in Designer, industrial/product. Protect from light and moisture. Keep tightly closed. Throw away any unused medicine after the expiration date. NOTE: This sheet is a summary. It may not cover all possible information. If you have questions about this medicine, talk to your doctor, pharmacist, or health care provider.  2015, Elsevier/Gold Standard. (2013-01-09 17:57:36)

## 2014-03-04 ENCOUNTER — Other Ambulatory Visit: Payer: Self-pay | Admitting: Geriatric Medicine

## 2014-03-04 ENCOUNTER — Ambulatory Visit
Admission: RE | Admit: 2014-03-04 | Discharge: 2014-03-04 | Disposition: A | Discharge status: Home / Self Care | Payer: Medicare Other | Source: Ambulatory Visit | Attending: Geriatric Medicine | Admitting: Geriatric Medicine

## 2014-03-04 DIAGNOSIS — R0989 Other specified symptoms and signs involving the circulatory and respiratory systems: Secondary | ICD-10-CM | POA: Diagnosis not present

## 2014-03-04 DIAGNOSIS — Z1389 Encounter for screening for other disorder: Secondary | ICD-10-CM | POA: Diagnosis not present

## 2014-03-04 DIAGNOSIS — I10 Essential (primary) hypertension: Secondary | ICD-10-CM | POA: Diagnosis not present

## 2014-03-04 DIAGNOSIS — Z23 Encounter for immunization: Secondary | ICD-10-CM | POA: Diagnosis not present

## 2014-03-04 DIAGNOSIS — Z79899 Other long term (current) drug therapy: Secondary | ICD-10-CM | POA: Diagnosis not present

## 2014-03-04 DIAGNOSIS — J984 Other disorders of lung: Secondary | ICD-10-CM | POA: Diagnosis not present

## 2014-03-04 DIAGNOSIS — Q253 Supravalvular aortic stenosis: Secondary | ICD-10-CM | POA: Diagnosis not present

## 2014-03-04 DIAGNOSIS — F17200 Nicotine dependence, unspecified, uncomplicated: Secondary | ICD-10-CM | POA: Diagnosis not present

## 2014-03-04 DIAGNOSIS — E780 Pure hypercholesterolemia: Secondary | ICD-10-CM | POA: Diagnosis not present

## 2014-03-04 DIAGNOSIS — Z0001 Encounter for general adult medical examination with abnormal findings: Secondary | ICD-10-CM | POA: Diagnosis not present

## 2014-03-04 DIAGNOSIS — I2510 Atherosclerotic heart disease of native coronary artery without angina pectoris: Secondary | ICD-10-CM | POA: Diagnosis not present

## 2014-03-05 ENCOUNTER — Telehealth: Payer: Self-pay | Admitting: *Deleted

## 2014-03-05 ENCOUNTER — Ambulatory Visit (HOSPITAL_COMMUNITY): Payer: Medicare Other | Attending: Interventional Cardiology | Admitting: Radiology

## 2014-03-05 ENCOUNTER — Other Ambulatory Visit: Payer: Self-pay

## 2014-03-05 DIAGNOSIS — I2510 Atherosclerotic heart disease of native coronary artery without angina pectoris: Secondary | ICD-10-CM | POA: Diagnosis not present

## 2014-03-05 DIAGNOSIS — R0789 Other chest pain: Secondary | ICD-10-CM

## 2014-03-05 DIAGNOSIS — I714 Abdominal aortic aneurysm, without rupture, unspecified: Secondary | ICD-10-CM

## 2014-03-05 DIAGNOSIS — R079 Chest pain, unspecified: Secondary | ICD-10-CM | POA: Insufficient documentation

## 2014-03-05 DIAGNOSIS — E785 Hyperlipidemia, unspecified: Secondary | ICD-10-CM | POA: Diagnosis not present

## 2014-03-05 DIAGNOSIS — I480 Paroxysmal atrial fibrillation: Secondary | ICD-10-CM | POA: Diagnosis not present

## 2014-03-05 MED ORDER — TECHNETIUM TC 99M SESTAMIBI GENERIC - CARDIOLITE
33.0000 | Freq: Once | INTRAVENOUS | Status: AC | PRN
  Administered 2014-03-05: 33 via INTRAVENOUS

## 2014-03-05 MED ORDER — TECHNETIUM TC 99M SESTAMIBI GENERIC - CARDIOLITE
11.0000 | Freq: Once | INTRAVENOUS | Status: AC | PRN
  Administered 2014-03-05: 11 via INTRAVENOUS

## 2014-03-05 MED ORDER — EZETIMIBE 10 MG PO TABS: 10.0000 mg | ORAL_TABLET | Freq: Every day | ORAL | Status: DC

## 2014-03-05 MED ORDER — REGADENOSON 0.4 MG/5ML IV SOLN
0.4000 mg | Freq: Once | INTRAVENOUS | Status: AC
  Administered 2014-03-05: 0.4 mg via INTRAVENOUS

## 2014-03-05 NOTE — Telephone Encounter (Signed)
Zetia was removed from patients list at last ov, but Dr Tamala Julian did not tell patient to stop taking this. Ok per [facility] to provide samples for patient.

## 2014-03-05 NOTE — Progress Notes (Signed)
[facility] SITE [address] NUCLEAR MED [address] [place], [place] [address] [phone]    Cardiology Nuclear Med Study  [name] is a 80 y.o. male     MRN : [id number]     DOB: [birth year]  Procedure Date: 03/05/2014  Nuclear Med Background Indication for Stress Test:  Evaluation for Ischemia History:  CAD, PAF, AFIB, AAA Cardiac Risk Factors: Hypertension  Symptoms:  Chest Tightness, DOE, Palpitations and SOB   Nuclear Pre-Procedure Caffeine/Decaff Intake:  None NPO After: 7:00pm   Lungs:  clear O2 Sat: 96% on room air. IV 0.9% NS with Angio Cath:  22g  IV Site: R Hand  IV Started by:  Crissie Figures, RN  Chest Size (in):  46 Cup Size: n/a  Height: 5' 10.5" (1.791 m)  Weight:  194 lb (87.998 kg)  BMI:  Body mass index is 27.43 kg/(m^2). Tech Comments:  N/A    Nuclear Med Study 1 or 2 day study: 1 day  Stress Test Type:  Lexiscan  Reading MD: N/A  Order Authorizing Provider:  Daneen Schick, MD  Resting Radionuclide: Technetium 49m Sestamibi  Resting Radionuclide Dose: 11.0 mCi   Stress Radionuclide:  Technetium 14m Sestamibi  Stress Radionuclide Dose: 33.0 mCi           Stress Protocol Rest HR: 70 Stress HR: 70  Rest BP: 139/80 Stress BP: 142/86  Exercise Time (min): n/a METS: n/a   Predicted Max HR: 139 bpm % Max HR: 68.35 bpm Rate Pressure Product: 13490   Dose of Adenosine (mg):  n/a Dose of Lexiscan: 0.4 mg  Dose of Atropine (mg): n/a Dose of Dobutamine: n/a mcg/kg/min (at max HR)  Stress Test Technologist: Perrin Maltese, EMT-P  Nuclear Technologist:  Earl Many, CNMT     Rest Procedure:  Myocardial perfusion imaging was performed at rest 45 minutes following the intravenous administration of Technetium 66m Sestamibi. Rest ECG: NSR - Normal EKG, occasional PVCs  Stress Procedure:  The patient received IV Lexiscan 0.4 mg over 15-seconds.  Technetium 57m Sestamibi injected at 30-seconds. This patient had sob with the Lexiscan injection.  Quantitative spect images were obtained after a 45 minute delay. Stress ECG: No significant change from baseline ECG  QPS Raw Data Images:  Normal; no motion artifact; normal heart/lung ratio. Stress Images:  There is a small area of moderate attenuation of the inferior base with normal homogeneous uptake in all other areas of the myocardium. Rest Images:  There is a small area of moderate attenuation of the inferior base with normal homogeneous uptake in all other areas of the myocardium. Subtraction (SDS):  No evidence of ischemia. Transient Ischemic Dilatation (Normal <1.22):  1.09 Lung/Heart Ratio (Normal <0.45):  0.33  Quantitative Gated Spect Images QGS EDV:  132 ml QGS ESV:  73 ml  Impression Exercise Capacity:  Lexiscan with no exercise. BP Response:  Normal blood pressure response. Clinical Symptoms:  No significant symptoms noted. ECG Impression:  No significant ST segment change suggestive of ischemia. Comparison with Prior Nuclear Study: No images to compare  Overall Impression:  Low risk stress nuclear study .  There is no evidence of ischemia.  There is severe hypokinesis of the inferior wall with a midly reduced EF .  LV Ejection Fraction: 45%.  LV Wall Motion:  Mildly depressed LV function.  marked hypokinesis of the inferior base.     Thayer Headings, Brooke Bonito., MD, [facility] 03/05/2014, 3:19 PM [address],  [address] Office -  [phone] Pager [phone]

## 2014-03-10 DIAGNOSIS — M47816 Spondylosis without myelopathy or radiculopathy, lumbar region: Secondary | ICD-10-CM | POA: Diagnosis not present

## 2014-03-12 ENCOUNTER — Telehealth: Payer: Self-pay

## 2014-03-12 NOTE — Telephone Encounter (Signed)
Pt wife aware of myoview results.Low risk myocardial perfusion study. No further testing needed unless the patient continues to have chest pain.pt wife verbalized understanding. She reports that pt main complaint has been sob, a cxr was ordered by Dr.Stoneking, there was an abnormality and pt tis being ref to a pulmonologist.

## 2014-03-12 NOTE — Telephone Encounter (Signed)
-----   Message from Sinclair Grooms, MD sent at 03/11/2014  7:54 PM EST ----- Low risk myocardial perfusion study. No further testing needed unless the patient continues to have chest pain.

## 2014-03-12 NOTE — Telephone Encounter (Signed)
Called to give pt myoview results.lmtcb 

## 2014-03-12 NOTE — Telephone Encounter (Signed)
Follow Up     Pt returning call from earlier regarding test results. Please call.

## 2014-03-19 ENCOUNTER — Institutional Professional Consult (permissible substitution): Payer: Medicare Other | Admitting: Pulmonary Disease

## 2014-03-27 DIAGNOSIS — J449 Chronic obstructive pulmonary disease, unspecified: Secondary | ICD-10-CM

## 2014-03-27 HISTORY — DX: Chronic obstructive pulmonary disease, unspecified: J44.9

## 2014-03-31 DIAGNOSIS — K130 Diseases of lips: Secondary | ICD-10-CM | POA: Diagnosis not present

## 2014-03-31 DIAGNOSIS — I10 Essential (primary) hypertension: Secondary | ICD-10-CM | POA: Diagnosis not present

## 2014-04-06 DIAGNOSIS — M47816 Spondylosis without myelopathy or radiculopathy, lumbar region: Secondary | ICD-10-CM | POA: Diagnosis not present

## 2014-04-07 ENCOUNTER — Ambulatory Visit (INDEPENDENT_AMBULATORY_CARE_PROVIDER_SITE_OTHER): Payer: Medicare Other | Admitting: Pulmonary Disease

## 2014-04-07 ENCOUNTER — Encounter: Payer: Self-pay | Admitting: Pulmonary Disease

## 2014-04-07 VITALS — BP 116/66 | HR 51 | Temp 96.9°F | Ht 70.5 in | Wt 191.0 lb

## 2014-04-07 DIAGNOSIS — J8410 Pulmonary fibrosis, unspecified: Secondary | ICD-10-CM | POA: Insufficient documentation

## 2014-04-07 NOTE — Assessment & Plan Note (Signed)
The patient has prominent interstitial lung disease that has been present dating back to 2005, but is clearly progressive from 2011. More than likely this is related to his rheumatoid arthritis, and he currently denies any occupational or environmental exposures. At this point, I think he needs a high-resolution CT chest to evaluate further, and will also need pulmonary function studies. If he indeed has a pattern that is consistent with usual interstitial pneumonitis, this is probably related to his rheumatoid arthritis. Unfortunately, he has had issues with recurrent infections in the past while on methotrexate. We would have to weigh the risk involved putting him on Imuran or CellCept if we wanted to treat rheumatoid lung disease more aggressively. I would probably lean toward a more conservative approach in this patient, unless we see rapidly progressive symptoms disease. We will also be able to evaluate for possible underlying airflow obstruction with his history of smoking. Perhaps a bronchodilator regimen if he does have obstruction, along with some type of conditioning program such as rehabilitation, would improve his quality of life enough that we could avoid more aggressive immunosuppression?

## 2014-04-07 NOTE — Patient Instructions (Signed)
Will schedule for scan of your chest to evaluate your lung scarring. Will arrange for breathing studies Once the above are done, will arrange for followup to discuss.

## 2014-04-07 NOTE — Progress Notes (Signed)
Subjective:    [name], male    DOB: 03-08-1931, 81 y.o.   MRN: [id number]  HPI The patient is an 81 year old male who I've been asked to see for an abnormal chest x-ray and dyspnea on exertion. He states that he has had shortness of breath with activity for years, but he and his family feel this is getting worse. Describes a less than one block dyspnea on exertion at a moderate pace on flat ground, and will get winded walking up one flight of stairs or bringing groceries in from the car. He has minimal cough, and no significant mucus production. He has a history of smoking 1-3 packs per day for 40+ years, but has not smoked in 25+ years. He also has a history of paroxysmal atrial fibrillation, and an echo in 2014 showed only mild diastolic dysfunction and mild increase in his pulmonary artery pressures. He has had a recent chest x-ray that shows prominent interstitial markings, and this is clearly worse from 2011. I have reviewed all of his films as far back as 2005, and he had early interstitial changes in 2005. 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 Detailed questioning reveals no obvious exposures that can cause lung disease. He denies any significant asbestos exposure, and does not have birds at home. He tells me that he has never been on amiodarone to his knowledge, and has no history of recurrent UTIs for which he would take Macrodantin.  He does have a history of rheumatoid arthritis for which he has been on gold and methotrexate in the past with good response. Because of immunosuppression and recurrent staph infections, these were discontinued. He has been managed on plaque when ill and prednisone alone, and has never been on any of the Biologics. He tells me that he has had pulmonary function studies in the past, but it has been many years.   Review of Systems  Constitutional: Negative for fever and unexpected  weight change.  HENT: Positive for congestion. Negative for dental problem, ear pain, nosebleeds, postnasal drip, rhinorrhea, sinus pressure, sneezing, sore throat and trouble swallowing.   Eyes: Negative for redness and itching.  Respiratory: Positive for cough and shortness of breath. Negative for chest tightness and wheezing.   Cardiovascular: Negative for palpitations and leg swelling.  Gastrointestinal: Negative for nausea and vomiting.  Genitourinary: Negative for dysuria.  Musculoskeletal: Negative for joint swelling.  Skin: Negative for rash.  Neurological: Negative for headaches.  Hematological: Does not bruise/bleed easily.  Psychiatric/Behavioral: Negative for dysphoric mood. The patient is not nervous/anxious.        Objective:   Physical Exam Constitutional:  Well developed, no acute distress  HENT:  Nares patent without discharge, septal deviation noted.  Oropharynx without exudate, palate and uvula are normal  Eyes:  Perrla, eomi, no scleral icterus  Neck:  No JVD, no TMG  Cardiovascular:  Mild tachy, regular rhythm, no rubs or gallops.  No murmurs        Intact distal pulses but diminished  Pulmonary :  Normal breath sounds, no stridor or respiratory distress   No rhonchi, or wheezing, bibasilar crackles 1/3 up bilat  Abdominal:  Soft, nondistended, bowel sounds present.  No tenderness noted.   Musculoskeletal:  minimal lower extremity edema noted.  Lymph Nodes:  No cervical lymphadenopathy noted  Skin:  No cyanosis noted  Neurologic:  Alert, appropriate, moves all 4 extremities without obvious deficit.  Assessment & Plan:

## 2014-04-10 ENCOUNTER — Ambulatory Visit (INDEPENDENT_AMBULATORY_CARE_PROVIDER_SITE_OTHER)
Admission: RE | Admit: 2014-04-10 | Discharge: 2014-04-10 | Disposition: A | Discharge status: Home / Self Care | Payer: Medicare Other | Source: Ambulatory Visit | Attending: Pulmonary Disease | Admitting: Pulmonary Disease

## 2014-04-10 DIAGNOSIS — I2510 Atherosclerotic heart disease of native coronary artery without angina pectoris: Secondary | ICD-10-CM | POA: Diagnosis not present

## 2014-04-10 DIAGNOSIS — J9809 Other diseases of bronchus, not elsewhere classified: Secondary | ICD-10-CM | POA: Diagnosis not present

## 2014-04-10 DIAGNOSIS — J8410 Pulmonary fibrosis, unspecified: Secondary | ICD-10-CM | POA: Diagnosis not present

## 2014-04-10 DIAGNOSIS — J432 Centrilobular emphysema: Secondary | ICD-10-CM | POA: Diagnosis not present

## 2014-04-21 DIAGNOSIS — M47816 Spondylosis without myelopathy or radiculopathy, lumbar region: Secondary | ICD-10-CM | POA: Diagnosis not present

## 2014-04-24 ENCOUNTER — Telehealth: Payer: Self-pay | Admitting: Interventional Cardiology

## 2014-04-24 NOTE — Telephone Encounter (Signed)
[name] calling to request Zetia samples for pt, pls call [phone]

## 2014-04-24 NOTE — Telephone Encounter (Signed)
Returned call. Will leave samples of Zetia 10 mg at the front desk for patient to pick up.

## 2014-04-27 ENCOUNTER — Other Ambulatory Visit: Payer: Self-pay | Admitting: Dermatology

## 2014-04-27 DIAGNOSIS — D1801 Hemangioma of skin and subcutaneous tissue: Secondary | ICD-10-CM | POA: Diagnosis not present

## 2014-04-27 DIAGNOSIS — D485 Neoplasm of uncertain behavior of skin: Secondary | ICD-10-CM | POA: Diagnosis not present

## 2014-04-27 DIAGNOSIS — L570 Actinic keratosis: Secondary | ICD-10-CM | POA: Diagnosis not present

## 2014-04-30 ENCOUNTER — Ambulatory Visit (INDEPENDENT_AMBULATORY_CARE_PROVIDER_SITE_OTHER): Payer: Medicare Other | Admitting: Pulmonary Disease

## 2014-04-30 DIAGNOSIS — J8410 Pulmonary fibrosis, unspecified: Secondary | ICD-10-CM

## 2014-04-30 LAB — PULMONARY FUNCTION TEST
DL/VA % pred: 69 %
DL/VA: 3.15 ml/min/mmHg/L
DLCO unc % pred: 66 %
DLCO unc: 20.52 ml/min/mmHg
FEF 25-75 Post: 3.23 L/sec
FEF 25-75 Pre: 2.33 L/sec
FEF2575-%CHANGE-POST: 38 %
FEF2575-%Pred-Post: 179 %
FEF2575-%Pred-Pre: 129 %
FEV1-%CHANGE-POST: 8 %
FEV1-%PRED-POST: 119 %
FEV1-%Pred-Pre: 109 %
FEV1-Post: 3.19 L
FEV1-Pre: 2.93 L
FEV1FVC-%CHANGE-POST: 4 %
FEV1FVC-%Pred-Pre: 106 %
FEV6-%CHANGE-POST: 4 %
FEV6-%PRED-POST: 113 %
FEV6-%PRED-PRE: 108 %
FEV6-Post: 4.01 L
FEV6-Pre: 3.84 L
FEV6FVC-%Change-Post: 0 %
FEV6FVC-%Pred-Post: 106 %
FEV6FVC-%Pred-Pre: 106 %
FVC-%CHANGE-POST: 4 %
FVC-%Pred-Post: 106 %
FVC-%Pred-Pre: 102 %
FVC-POST: 4.06 L
PRE FEV6/FVC RATIO: 99 %
Post FEV1/FVC ratio: 79 %
Post FEV6/FVC ratio: 99 %
Pre FEV1/FVC ratio: 75 %
RV % pred: 71 %
RV: 1.88 L
TLC % PRED: 85 %
TLC: 5.83 L

## 2014-04-30 NOTE — Progress Notes (Signed)
PFT done today. 

## 2014-05-05 ENCOUNTER — Ambulatory Visit (INDEPENDENT_AMBULATORY_CARE_PROVIDER_SITE_OTHER): Payer: Medicare Other | Admitting: Pulmonary Disease

## 2014-05-05 ENCOUNTER — Telehealth: Payer: Self-pay | Admitting: Pulmonary Disease

## 2014-05-05 ENCOUNTER — Encounter: Payer: Self-pay | Admitting: Pulmonary Disease

## 2014-05-05 VITALS — BP 126/74 | HR 107 | Temp 97.6°F | Ht 69.0 in | Wt 189.6 lb

## 2014-05-05 DIAGNOSIS — J8410 Pulmonary fibrosis, unspecified: Secondary | ICD-10-CM | POA: Diagnosis not present

## 2014-05-05 NOTE — Telephone Encounter (Signed)
Melissa calling back says this is an emergency call please call asap @ [phone].Hillery Hunter

## 2014-05-05 NOTE — Progress Notes (Signed)
   Subjective:    [name], male    DOB: 1930/05/08, 81 y.o.   MRN: [id number]  HPI The patient comes in today for follow-up of his known interstitial lung disease. He has had a high-resolution CT that shows classic changes of UIP, and it has progressed from 2005. He has had pulmonary function studies that show surprisingly no airflow obstruction, no restriction by total lung capacity, and only a mild reduction in DLCO at 66% of predicted. I have reviewed the studies in great length with he and family member, and answered all of their questions.   Review of Systems  Constitutional: Negative for fever and unexpected weight change.  HENT: Negative for congestion, dental problem, ear pain, nosebleeds, postnasal drip, rhinorrhea, sinus pressure, sneezing, sore throat and trouble swallowing.   Eyes: Negative for redness and itching.  Respiratory: Positive for cough and shortness of breath. Negative for chest tightness and wheezing.   Cardiovascular: Negative for palpitations and leg swelling.  Gastrointestinal: Negative for nausea and vomiting.  Genitourinary: Negative for dysuria.  Musculoskeletal: Negative for joint swelling.  Skin: Negative for rash.  Neurological: Negative for headaches.  Hematological: Does not bruise/bleed easily.  Psychiatric/Behavioral: Negative for dysphoric mood. The patient is not nervous/anxious.        Objective:   Physical Exam Well-developed male in no acute distress Nose without purulence or discharge noted Neck without lymphadenopathy or thyromegaly Lower extremities with minimal edema, no cyanosis Alert and oriented, moves all 4 extremities.       Assessment & Plan:

## 2014-05-05 NOTE — Telephone Encounter (Signed)
Called and spoke to [place]. Melissa stated the pt is upset because he was under the impression the O2 would be delivered today. Per pt's order pt is to have the POC eval before O2 is delivered. Informed pt's wife of the order and the process. [name] stated she was told that if they were not contacted today and O2 delivered then to call our office back. Informed pt's wife that [facility] will come out in a couple days to evaluate pt for POC. Informed pt's wife if [facility] has not come by their home by the end of the week then to call us back. [name] verbalized understanding and denied any further questions or concerns at this time.

## 2014-05-05 NOTE — Patient Instructions (Signed)
Will start on oxygen with exertion, and will check your overnight oxygen as well. Stay as active as possible, and let us know if you wish to attend pulmonary rehab at [facility] followup with me again in 54mos, with chest xray the same day.

## 2014-05-05 NOTE — Assessment & Plan Note (Addendum)
The patient has interstitial lung disease that is progressive from 2005 which by high-resolution CT is most consistent with usual interstitial pneumonia. Given his underlying rheumatoid arthritis, this is most certainly the etiology. The good news here is that [name] does not have any airflow obstruction, restriction, but does have a mild reduction in his diffusion capacity on PFTs. I have had a long discussion with [name] and his family member about treatment for this, and I'm concerned the risk outweighed the benefits currently. [name] has tolerated aggressive immunosuppression poorly in the past, and I would be hesitant to put him on Cytoxan, Imuran, or CellCept. His PFTs are also not overly abnormal. [name] does have significant oxygen desaturation with walking today, and perhaps ambulatory oxygen would help him with his quality of life. We will also need to check overnight oximetry as well.  Time spent today with pt discussing above was 34 min.

## 2014-05-13 DIAGNOSIS — J8410 Pulmonary fibrosis, unspecified: Secondary | ICD-10-CM | POA: Diagnosis not present

## 2014-05-13 DIAGNOSIS — R0602 Shortness of breath: Secondary | ICD-10-CM | POA: Diagnosis not present

## 2014-05-15 ENCOUNTER — Encounter: Payer: Self-pay | Admitting: Family

## 2014-05-15 ENCOUNTER — Telehealth: Payer: Self-pay | Admitting: Pulmonary Disease

## 2014-05-15 DIAGNOSIS — J8410 Pulmonary fibrosis, unspecified: Secondary | ICD-10-CM

## 2014-05-15 NOTE — Telephone Encounter (Signed)
Message sent to [facility] from [facility] to find out about orders for oxygen.  Will wait for call back from [facility].

## 2014-05-18 ENCOUNTER — Ambulatory Visit (INDEPENDENT_AMBULATORY_CARE_PROVIDER_SITE_OTHER): Payer: Medicare Other | Admitting: Family

## 2014-05-18 ENCOUNTER — Encounter: Payer: Self-pay | Admitting: Family

## 2014-05-18 ENCOUNTER — Ambulatory Visit (HOSPITAL_COMMUNITY)
Admission: RE | Admit: 2014-05-18 | Discharge: 2014-05-18 | Disposition: A | Discharge status: Home / Self Care | Payer: Medicare Other | Source: Ambulatory Visit | Attending: Family | Admitting: Family

## 2014-05-18 VITALS — BP 128/79 | HR 78 | Resp 16 | Ht 70.0 in | Wt 190.0 lb

## 2014-05-18 DIAGNOSIS — I714 Abdominal aortic aneurysm, without rupture, unspecified: Secondary | ICD-10-CM

## 2014-05-18 NOTE — Telephone Encounter (Signed)
Spoke with Respiratory therapy at [facility], states the ono and poc eval that were ordered on 05/05/14 were performed but there was no order for any 02.    [place] have you seen the POC eval and the ono?  If so, please advise on 02 order.  If not, let us know so we can request it again from [facility].    Thanks!

## 2014-05-18 NOTE — Telephone Encounter (Signed)
I have sent a staff message to [facility] to have these results refaxed to [place]. [name] is aware that we are working on this and will be in contact with her. Will route to Mindy to keep an eye out for these results.

## 2014-05-18 NOTE — Telephone Encounter (Signed)
[name] is aware that he does not oxygen while sleeping. I have sent a staff message to [facility] to have her send over pt's POC evaluation. Will route message to Mindy to keep an eye out for this.

## 2014-05-18 NOTE — Telephone Encounter (Signed)
Please let pt know that his overnight study did not show any significant decrease in his oxygen level.  He only spent 36 sec the entire night less than 88%  Does not require oxygen during sleep

## 2014-05-18 NOTE — Patient Instructions (Signed)
Abdominal Aortic Aneurysm An aneurysm is a weakened or damaged part of an artery wall that bulges from the normal force of blood pumping through the body. An abdominal aortic aneurysm is an aneurysm that occurs in the lower part of the aorta, the main artery of the body.  The major concern with an abdominal aortic aneurysm is that it can enlarge and burst (rupture) or blood can flow between the layers of the wall of the aorta through a tear (aorticdissection). Both of these conditions can cause bleeding inside the body and can be life threatening unless diagnosed and treated promptly. CAUSES  The exact cause of an abdominal aortic aneurysm is unknown. Some contributing factors are:   A hardening of the arteries caused by the buildup of fat and other substances in the lining of a blood vessel (arteriosclerosis).  Inflammation of the walls of an artery (arteritis).   Connective tissue diseases, such as Marfan syndrome.   Abdominal trauma.   An infection, such as syphilis or staphylococcus, in the wall of the aorta (infectious aortitis) caused by bacteria. RISK FACTORS  Risk factors that contribute to an abdominal aortic aneurysm may include:  Age older than 60 years.   High blood pressure (hypertension).  Male gender.  Ethnicity (white race).  Obesity.  Family history of aneurysm (first degree relatives only).  Tobacco use. PREVENTION  The following healthy lifestyle habits may help decrease your risk of abdominal aortic aneurysm:  Quitting smoking. Smoking can raise your blood pressure and cause arteriosclerosis.  Limiting or avoiding alcohol.  Keeping your blood pressure, blood sugar level, and cholesterol levels within normal limits.  Decreasing your salt intake. In somepeople, too much salt can raise blood pressure and increase your risk of abdominal aortic aneurysm.  Eating a diet low in saturated fats and cholesterol.  Increasing your fiber intake by including  whole grains, vegetables, and fruits in your diet. Eating these foods may help lower blood pressure.  Maintaining a healthy weight.  Staying physically active and exercising regularly. SYMPTOMS  The symptoms of abdominal aortic aneurysm may vary depending on the size and rate of growth of the aneurysm.Most grow slowly and do not have any symptoms. When symptoms do occur, they may include:  Pain (abdomen, side, lower back, or groin). The pain may vary in intensity. A sudden onset of severe pain may indicate that the aneurysm has ruptured.  Feeling full after eating only small amounts of food.  Nausea or vomiting or both.  Feeling a pulsating lump in the abdomen.  Feeling faint or passing out. DIAGNOSIS  Since most unruptured abdominal aortic aneurysms have no symptoms, they are often discovered during diagnostic exams for other conditions. An aneurysm may be found during the following procedures:  Ultrasonography (A one-time screening for abdominal aortic aneurysm by ultrasonography is also recommended for all men aged 65-75 years who have ever smoked).  X-ray exams.  A computed tomography (CT).  Magnetic resonance imaging (MRI).  Angiography or arteriography. TREATMENT  Treatment of an abdominal aortic aneurysm depends on the size of your aneurysm, your age, and risk factors for rupture. Medication to control blood pressure and pain may be used to manage aneurysms smaller than 6 cm. Regular monitoring for enlargement may be recommended by your caregiver if:  The aneurysm is 3-4 cm in size (an annual ultrasonography may be recommended).  The aneurysm is 4-4.5 cm in size (an ultrasonography every 6 months may be recommended).  The aneurysm is larger than 4.5 cm in   size (your caregiver may ask that you be examined by a vascular surgeon). If your aneurysm is larger than 6 cm, surgical repair may be recommended. There are two main methods for repair of an aneurysm:   Endovascular  repair (a minimally invasive surgery). This is done most often.  Open repair. This method is used if an endovascular repair is not possible. Document Released: 12/21/2004 Document Revised: 07/08/2012 Document Reviewed: 04/12/2012 ExitCare Patient Information 2015 [employer]. This information is not intended to replace advice given to you by your health care provider. Make sure you discuss any questions you have with your health care provider.  

## 2014-05-18 NOTE — Progress Notes (Signed)
[facility] [place]  Established Abdominal Aortic Aneurysm  History of Present Illness  [name] is a 81 y.o. (Aug 04, 1930) male that Dr. Kellie Simmering has been monitoring for AAA and he presents today for: follow up of AAA. Previous studies demonstrate an AAA, measuring 4.32 cm (05/01/2012). The patient does have chronic back pain which wife attributes to generalized RA, denies abdominal pain. The patient is a former smoker. The patient denies claudication in legs with walking. The patient denies history of stroke or TIA symptoms. [place], seem fairly physically active. Pt was diagnosed in February 2016 with pulmonary fibrosis, wife states this may be secondary to his RA, gradual progression, followed by Dr. Gwenette Greet.  2003 CT showed 3.5 cm as largest diameter.  Pt Diabetic: No Pt smoker: former smoker, quit in the 1980's  Past Medical History  Diagnosis Date  . AAA (abdominal aortic aneurysm)   . Staph skin infection     Back  . Immune disorder   . Myocardial infarction 2000  . Shortness of breath   . Arthritis     ra  . Hypertension     dr Daneen Schick  . Hematuria, gross     HISTORY OF  ?  2004  . Atrial fibrillation   . Coronary atherosclerosis   . Rheumatoid arthritis    Past Surgical History  Procedure Laterality Date  . Stents  2000  . Back surgery    . Hand surgery      rel to staph  . Total knee arthroplasty Right 01/20/2013    Procedure: RIGHT TOTAL KNEE ARTHROPLASTY;  Surgeon: Kerin Salen, MD;  Location: [facility];  Service: Orthopedics;  Laterality: Right;  . Esophagogastroduodenoscopy  2004    ?  Marland Kitchen Eye surgery Left 03-14-13    [place]  . Joint replacement      bil hips  . Joint replacement Right 01-20-13    Knee   Social History History   Social History  . Marital Status: Married    Spouse Name: N/A  . Number of Children: N/A  . Years of Education: N/A   Occupational History  . Not on file.   Social History Main Topics  .  Smoking status: Former Smoker -- 3.00 packs/day for 50 years    Types: Cigarettes    Quit date: 03/27/1978  . Smokeless tobacco: Former Systems developer    Types: Chew  . Alcohol Use: No  . Drug Use: No  . Sexual Activity: Not on file   Other Topics Concern  . Not on file   Social History Narrative   Family History Family History  Problem Relation Age of Onset  . Heart disease Brother     Heart Disease before age 38  . Stroke Brother   . Heart attack Brother   . Varicose Veins Sister   . Diabetes Son   . Heart disease Son     Heart Disease before age 33  . COPD Sister     Current Outpatient Prescriptions on File Prior to Visit  Medication Sig Dispense Refill  . aspirin 81 MG tablet Take 81 mg by mouth daily.    Marland Kitchen atorvastatin (LIPITOR) 40 MG tablet Take 40 mg by mouth daily.    . calcium carbonate (OS-CAL) 600 MG TABS Take 600 mg by mouth daily.     . diclofenac sodium (VOLTAREN) 1 % GEL Apply 1 application topically 2 (two) times daily as needed (joint/muscle pain).     Marland Kitchen ezetimibe (  ZETIA) 10 MG tablet Take 1 tablet (10 mg total) by mouth daily.    . hydroxychloroquine (PLAQUENIL) 200 MG tablet Take 200 mg by mouth 2 (two) times daily.     . metoprolol tartrate (LOPRESSOR) 25 MG tablet Take 25 mg by mouth 2 (two) times daily.    . Multiple Vitamin (MULTIVITAMIN) capsule Take 1 capsule by mouth daily.      . Multiple Vitamins-Minerals (OCUVITE PRESERVISION PO) Take 1 tablet by mouth daily.    . nitroGLYCERIN (NITROSTAT) 0.4 MG SL tablet Place 1 tablet (0.4 mg total) under the tongue every 5 (five) minutes as needed for chest pain. 25 tablet 3  . omeprazole (PRILOSEC) 20 MG capsule Take 20 mg by mouth daily.      . predniSONE (DELTASONE) 5 MG tablet Take 5 mg by mouth daily.      Marland Kitchen Propylene Glycol (SYSTANE BALANCE OP) Place 1 drop into both eyes 2 (two) times daily.    . ramipril (ALTACE) 2.5 MG tablet Take 2.5 mg by mouth daily.      . Tamsulosin HCl (FLOMAX) 0.4 MG CAPS Take 0.4 mg  by mouth daily.      No current facility-administered medications on file prior to visit.   Allergies  Allergen Reactions  . Morphine And Related Nausea And Vomiting and Other (See Comments)    Hallucinations   . Morphine Other (See Comments)    "makes me crazy"    ROS: See HPI for pertinent positives and negatives.  Physical Examination  Filed Vitals:   05/18/14 0925  BP: 128/79  Pulse: 78  Resp: 16  Height: 5\' 10"  (1.778 m)  Weight: 190 lb (86.183 kg)  SpO2: 97%   Body mass index is 27.26 kg/(m^2).   General: A&O x 3, WD, male.  Pulmonary: Sym exp, good air movt, CTAB, rales in both bases, no rhonchi, or wheezing.  Cardiac: RRR, Nl S1, S2, no no detected murmur.   Carotid Bruits Left Right   Negative Negative  Aorta is not palpable Radial pulses are 2+ and =   VASCULAR EXAM:     LE Pulses LEFT RIGHT       FEMORAL 2+ palpable 2+ palpable   POPLITEAL not palpable  not palpable   POSTERIOR TIBIAL not palpable   not palpable    DORSALIS PEDIS  ANTERIOR TIBIAL palpable  faintly palpable     Gastrointestinal: soft, NTND, -G/R, - HSM, - palpable masses, - CVAT B.  Musculoskeletal: M/S 4/5 throughout, Extremities without ischemic changes.  Neurologic: CN 2-12 grossly intact, Motor exam as listed above.          Non-Invasive Vascular Imaging  AAA Duplex (05/18/2014) ABDOMINAL AORTA DUPLEX EVALUATION    INDICATION: Follow-up abdominal aortic aneurysm     PREVIOUS INTERVENTION(S):     DUPLEX EXAM:     LOCATION DIAMETER AP (cm) DIAMETER TRANSVERSE (cm) VELOCITIES (cm/sec)  Aorta Proximal 2.97 2.95 75  Aorta Mid 4.43 4.43 93  Aorta Distal 2.82 2.87 47  Right Common Iliac Artery 1.13  117  Left Common Iliac Artery 0.91  163    Previous max aortic  diameter:  4.42cm x 4.42cm Date: 05/05/2013     ADDITIONAL FINDINGS:     IMPRESSION: Stable abdominal aortic aneurysm measuring 4.43cm on today's exam with mild intramural thrombus observed.    Compared to the previous exam:  No significant change compared to prior exam.      Medical Decision Making  The patient is a 81  y.o. male who presents with asymptomatic AAA with no significant increase in size.   Based on this patient's exam and diagnostic studies, the patient will follow up in 1 year  with the following studies: AAA Duplex.  Consideration for repair of AAA would be made when the size approaches 4.8 or 5.0 cm, growth > 1 cm/yr, and symptomatic status.  I emphasized the importance of maximal medical management including strict control of blood pressure, blood glucose, and lipid levels, antiplatelet agents, obtaining regular exercise, and continued cessation of smoking.   The patient was given information about AAA including signs, symptoms, treatment, and how to minimize the risk of enlargement and rupture of aneurysms.    The patient was advised to call 911 should the patient experience sudden onset abdominal or back pain.   Thank you for allowing [place] to participate in this patient's care.  Clemon Chambers, RN, MSN, FNP-C [facility] [place] Office: [facility] Physician: Trula Slade  05/18/2014, 9:22 AM

## 2014-05-18 NOTE — Telephone Encounter (Signed)
Results are in KC's green folder. Please advise thanks

## 2014-05-18 NOTE — Telephone Encounter (Signed)
The only thing in my folder is ONO.  I need his POC evaluation to determine needs during day and get his oxygen ordered.  If they have not done this yet, will send somewhere else.

## 2014-05-18 NOTE — Telephone Encounter (Signed)
I have not seen the POC evaluation, and that is why no oxygen has been ordered.

## 2014-05-18 NOTE — Telephone Encounter (Signed)
See last note

## 2014-05-18 NOTE — Telephone Encounter (Signed)
Pt wife is very upset stating [facility] is not responding in the time she feels they need to respond, it has been two weeks since order was placed and she has even left a message for the manager at [place] and she has yet to call her back either, [phone]

## 2014-05-18 NOTE — Addendum Note (Signed)
Addended by: Mena Goes on: 05/18/2014 03:52 PM   Modules accepted: Orders

## 2014-05-19 NOTE — Telephone Encounter (Signed)
Order has been placed per [facility]. [name] is aware that we have taken care of this. Advised her to call us if she doesn't hear from [facility] by this time tomorrow. She agreed.

## 2014-05-19 NOTE — Telephone Encounter (Signed)
This has been placed in KC green folder. Please advise thanks 

## 2014-05-19 NOTE — Telephone Encounter (Signed)
Please get it thru their heads the pt DOES NOT WANT [place] ANY CIRCUMSTANCE  Ok to order oxygen by pulsed POC at 2 liters

## 2014-05-19 NOTE — Telephone Encounter (Signed)
Received message from Melvina: We can't do the POC eval yet because there hasn't been an order for daytime O2 at this point. The patient would have to have qualifying sats in your office and we would have to have an O2 order then we could eval for a POC.  Documentation in order placed: Staff message sent to Darlina Guys for [facility] to arrange: 1) ONO on room air one night with download to Dr. Gwenette Greet 2) Needs poc evaluation asap, desat to 85% in office today. Does not need tanks until POC eval. 3) Pt requested to use the [employer]. Contact patient on home number to arrange. [place] the [facility] location and spoke with Freda Munro. She is going to fax the POC evaluation to [place] [phone]). Will route to Mindy to follow up on.

## 2014-05-20 ENCOUNTER — Telehealth: Payer: Self-pay | Admitting: Pulmonary Disease

## 2014-05-20 DIAGNOSIS — J8410 Pulmonary fibrosis, unspecified: Secondary | ICD-10-CM

## 2014-05-20 NOTE — Telephone Encounter (Signed)
[name] called and stated that order had to be changed. Order was entered incorrectly.

## 2014-05-20 NOTE — Telephone Encounter (Signed)
Spoke with Melissa and new order has been placed. Nothing further is needed.

## 2014-05-20 NOTE — Telephone Encounter (Signed)
Spoke with pt's wife. Advised her that I spoke with Melissa this AM about this. Melissa states that she sees the order in Epic and will be working on this for them. She verbalized understanding. Nothing further was needed.

## 2014-05-21 ENCOUNTER — Telehealth: Payer: Self-pay | Admitting: Pulmonary Disease

## 2014-05-21 NOTE — Telephone Encounter (Signed)
[name]

## 2014-05-21 NOTE — Telephone Encounter (Signed)
The wife has excepted the concentrator and the mini FYI per [facility]

## 2014-05-21 NOTE — Telephone Encounter (Signed)
Melissa calling back.  [phone]

## 2014-05-21 NOTE — Telephone Encounter (Signed)
Spoke with Melissa ([facility]), states pt and wife refused 02 yesterday because according to them [facility] only ordered a Simply Go Mini.  Melissa states that he will also need a concentrator for the house as well as the mini with no tanks, because ins won't cover just the Simply Go Mini.  The family was upset that a concentrator was delivered, so they declined all 02.  Private pay for the Simply Go was offered but pt and family declined.  Per Melissa, the only way [facility] can provide the Simply go Mini is if there is a home concentrator as well.    Spoke with pt's wife, she is upset that they are receiving a home 02 concentrator as well as the Simply Go Mini and were going to deliver the device between 8:00 and 12:00 midnight.  She states that they will not accept anything but the Simply Go and refuses to have the home concentrator in their home "sitting around collecting dust".  I advised pt of what Melissa said above about how a home concentrator has to be delivered if the simply go will be covered.  I advised pt's wife that a private pay for the Simply Go is the only other option at this time.  She is requesting that an exception be made to this.  I advised pt that our office does not do billing for [facility] as they are a totally different company.    Called Melissa to make her aware of pt's wife's standing.  She states the only other option she has is to go to respiratory and see if an appeal can be made as they are the ones who came to this decision of need for the pt.  Melissa will call us once a decision is made.

## 2014-05-21 NOTE — Telephone Encounter (Signed)
Noted  

## 2014-05-21 NOTE — Telephone Encounter (Signed)
Spoke with Lenna Sciara, states that respiratory gave pt's wife the 3 options:   1. Private pay for simply go mini 2. Go with home concentrator and simply go mini 3. Simply go- portable 02 and concentrator all in 1- is larger, 10lbs instead of 5lbs like the mini.  States that the pt's wife is requesting measurements of home concentrator.  Melissa will keep [place] updated when pt's wife makes a decision.

## 2014-06-02 DIAGNOSIS — M47816 Spondylosis without myelopathy or radiculopathy, lumbar region: Secondary | ICD-10-CM | POA: Diagnosis not present

## 2014-06-04 ENCOUNTER — Encounter: Payer: Self-pay | Admitting: Pulmonary Disease

## 2014-06-04 DIAGNOSIS — M2550 Pain in unspecified joint: Secondary | ICD-10-CM | POA: Diagnosis not present

## 2014-06-04 DIAGNOSIS — M545 Low back pain: Secondary | ICD-10-CM | POA: Diagnosis not present

## 2014-06-04 DIAGNOSIS — Z79899 Other long term (current) drug therapy: Secondary | ICD-10-CM | POA: Diagnosis not present

## 2014-06-04 DIAGNOSIS — M0589 Other rheumatoid arthritis with rheumatoid factor of multiple sites: Secondary | ICD-10-CM | POA: Diagnosis not present

## 2014-09-02 DIAGNOSIS — E780 Pure hypercholesterolemia: Secondary | ICD-10-CM | POA: Diagnosis not present

## 2014-09-02 DIAGNOSIS — Z79899 Other long term (current) drug therapy: Secondary | ICD-10-CM | POA: Diagnosis not present

## 2014-09-02 DIAGNOSIS — D539 Nutritional anemia, unspecified: Secondary | ICD-10-CM | POA: Diagnosis not present

## 2014-09-02 DIAGNOSIS — I10 Essential (primary) hypertension: Secondary | ICD-10-CM | POA: Diagnosis not present

## 2014-10-20 DIAGNOSIS — B029 Zoster without complications: Secondary | ICD-10-CM | POA: Diagnosis not present

## 2014-11-03 ENCOUNTER — Ambulatory Visit: Payer: Medicare Other | Admitting: Pulmonary Disease

## 2014-11-03 ENCOUNTER — Ambulatory Visit (INDEPENDENT_AMBULATORY_CARE_PROVIDER_SITE_OTHER): Payer: Medicare Other | Admitting: Internal Medicine

## 2014-11-03 ENCOUNTER — Encounter: Payer: Self-pay | Admitting: Internal Medicine

## 2014-11-03 ENCOUNTER — Ambulatory Visit (INDEPENDENT_AMBULATORY_CARE_PROVIDER_SITE_OTHER)
Admission: RE | Admit: 2014-11-03 | Discharge: 2014-11-03 | Disposition: A | Discharge status: Home / Self Care | Payer: Medicare Other | Source: Ambulatory Visit | Attending: Internal Medicine | Admitting: Internal Medicine

## 2014-11-03 VITALS — BP 140/80 | HR 93 | Ht 70.0 in | Wt 185.0 lb

## 2014-11-03 DIAGNOSIS — J8410 Pulmonary fibrosis, unspecified: Secondary | ICD-10-CM

## 2014-11-03 DIAGNOSIS — I10 Essential (primary) hypertension: Secondary | ICD-10-CM | POA: Diagnosis not present

## 2014-11-03 DIAGNOSIS — J8489 Other specified interstitial pulmonary diseases: Secondary | ICD-10-CM | POA: Diagnosis not present

## 2014-11-03 NOTE — Progress Notes (Signed)
Subjective:     [name], male   DOB: 08-20-1930,    MRN: [id number]  HPI  66 yowm with steroid dep RA f/u [facility] and PF f/u by [facility] previously.    11/03/2014 1st   office visit/ [name]  / on Altace  Chief Complaint  Patient presents with  . Follow-up    Former pt of [name]. Pt states that his breathing is doing well and denies any new co's today.   limited by back more than sob / arthritis good control since last ov here  No obvious day to day or daytime variability or assoc chronic cough or cp or chest tightness, subjective wheeze or overt sinus or hb symptoms. No unusual exp hx or h/o childhood pna/ asthma or knowledge of premature birth.  Sleeping ok without nocturnal  or early am exacerbation  of respiratory  c/o's or need for noct saba. Also denies any obvious fluctuation of symptoms with weather or environmental changes or other aggravating or alleviating factors except as outlined above   Current Medications, Allergies, Complete Past Medical History, Past Surgical History, Family History, and Social History were reviewed in [employer] record.  ROS  The following are not active complaints unless bolded sore throat, dysphagia, dental problems, itching, sneezing,  nasal congestion or excess/ purulent secretions, ear ache,   fever, chills, sweats, unintended wt loss, classically pleuritic or exertional cp, hemoptysis,  orthopnea pnd or leg swelling, presyncope, palpitations, abdominal pain, anorexia, nausea, vomiting, diarrhea  or change in bowel or bladder habits, change in stools or urine, dysuria,hematuria,  rash, arthralgias, visual complaints, headache, numbness, weakness or ataxia or problems with walking or coordination,  change in mood/affect or memory.       Review of Systems     Objective:   Physical Exam amb wm nad  Wt Readings from Last 3 Encounters:  11/03/14 185 lb (83.915 kg)  05/18/14 190 lb (86.183 kg)  05/05/14 189 lb  9.6 oz (86.002 kg)    Vital signs reviewed  HEENT: nl dentition, turbinates, and orophanx. Nl external ear canals without cough reflex   NECK :  without JVD/Nodes/TM/ nl carotid upstrokes bilaterally   LUNGS: no acc muscle use,  Very minimal insp crackles both bases  without cough on insp or exp maneuvers   CV:  RRR  no s3 or murmur or increase in P2, no edema   ABD:  soft and nontender with nl excursion in the supine position. No bruits or organomegaly, bowel sounds nl  MS:  warm without deformities, calf tenderness, cyanosis or clubbing  SKIN: warm and dry without lesions    NEURO:  alert, approp, no deficits     I personally reviewed images and agree with radiology impression as follows:  CXR:   11/03/14  1. Stable severe changes of bilateral pulmonary interstitial fibrosis.  2. Stable cardiomegaly, no pulmonary venous congestion. No acute abnormality.        Assessment:

## 2014-11-03 NOTE — Patient Instructions (Signed)
Please schedule a follow up visit in 6 months but call sooner (for cough or breathing problems)  if needed with pfts on return

## 2014-11-04 ENCOUNTER — Encounter: Payer: Self-pay | Admitting: Internal Medicine

## 2014-11-04 DIAGNOSIS — I10 Essential (primary) hypertension: Secondary | ICD-10-CM | POA: Insufficient documentation

## 2014-11-04 NOTE — Assessment & Plan Note (Addendum)
-   HRCT 04/10/14 1. The appearance of the lungs does indicate underlying interstitial lung disease, and the pattern is favored to reflect usual interstitial pneumonia (UIP). The findings described above have clearly progressed compared to remote prior study 03/26/2004 where the lung bases were incidentally imaged on a CT of the abdomen and pelvis. 2. In addition, there is diffuse bronchial wall thickening with moderate centrilobular and parase - PFTs 04/30/14  VC  3.96 (104%)  s obstruction and dlco 66% corrects to 69% - 11/03/2014  Walked RA x 3 laps @ 185 ft each stopped due to  End of study, nl pace, no sob or desat   > ok to d/c 02    I had an extended discussion with the patient reviewing all relevant studies completed to date and  lasting 15 to 20 minutes of a 25 minute visit    1) he has very mild of  typical of pt with underlying connective tissue and as long as this remains well controlled he should do fine  2) f/u in 6 months pfts approp to put two points on the curve but serial cxr's and hrct not needed unless new/ unexplained symptoms   Each maintenance medication was reviewed in detail including most importantly the difference between maintenance and prns and under what circumstances the prns are to be triggered using an action plan format that is not reflected in the computer generated alphabetically organized AVS.    Please see instructions for details which were reviewed in writing and the patient given a copy highlighting the part that I personally wrote and discussed at today's ov.

## 2014-11-04 NOTE — Assessment & Plan Note (Signed)
Adequate control on present rx, reviewed > no change in rx needed  But note on ACEi which can be problematic in trying to interpret any change in cough or sob related to pf so low threshold to change to arb if this becomes an issue here

## 2015-02-10 DIAGNOSIS — Z23 Encounter for immunization: Secondary | ICD-10-CM | POA: Diagnosis not present

## 2015-02-10 DIAGNOSIS — Z79899 Other long term (current) drug therapy: Secondary | ICD-10-CM | POA: Diagnosis not present

## 2015-02-10 DIAGNOSIS — J8410 Pulmonary fibrosis, unspecified: Secondary | ICD-10-CM | POA: Diagnosis not present

## 2015-02-10 DIAGNOSIS — M0579 Rheumatoid arthritis with rheumatoid factor of multiple sites without organ or systems involvement: Secondary | ICD-10-CM | POA: Diagnosis not present

## 2015-02-10 DIAGNOSIS — M545 Low back pain: Secondary | ICD-10-CM | POA: Diagnosis not present

## 2015-02-10 DIAGNOSIS — M2550 Pain in unspecified joint: Secondary | ICD-10-CM | POA: Diagnosis not present

## 2015-04-13 DIAGNOSIS — Z961 Presence of intraocular lens: Secondary | ICD-10-CM | POA: Diagnosis not present

## 2015-04-13 DIAGNOSIS — H52223 Regular astigmatism, bilateral: Secondary | ICD-10-CM | POA: Diagnosis not present

## 2015-04-13 DIAGNOSIS — H35363 Drusen (degenerative) of macula, bilateral: Secondary | ICD-10-CM | POA: Diagnosis not present

## 2015-04-13 DIAGNOSIS — H11423 Conjunctival edema, bilateral: Secondary | ICD-10-CM | POA: Diagnosis not present

## 2015-04-13 DIAGNOSIS — Z79899 Other long term (current) drug therapy: Secondary | ICD-10-CM | POA: Diagnosis not present

## 2015-04-13 DIAGNOSIS — H40023 Open angle with borderline findings, high risk, bilateral: Secondary | ICD-10-CM | POA: Diagnosis not present

## 2015-04-13 DIAGNOSIS — Z9849 Cataract extraction status, unspecified eye: Secondary | ICD-10-CM | POA: Diagnosis not present

## 2015-04-13 DIAGNOSIS — H524 Presbyopia: Secondary | ICD-10-CM | POA: Diagnosis not present

## 2015-04-13 DIAGNOSIS — H04123 Dry eye syndrome of bilateral lacrimal glands: Secondary | ICD-10-CM | POA: Diagnosis not present

## 2015-04-13 DIAGNOSIS — H11153 Pinguecula, bilateral: Secondary | ICD-10-CM | POA: Diagnosis not present

## 2015-04-13 DIAGNOSIS — H02105 Unspecified ectropion of left lower eyelid: Secondary | ICD-10-CM | POA: Diagnosis not present

## 2015-04-13 DIAGNOSIS — H5203 Hypermetropia, bilateral: Secondary | ICD-10-CM | POA: Diagnosis not present

## 2015-04-21 ENCOUNTER — Telehealth: Payer: Self-pay | Admitting: Interventional Cardiology

## 2015-04-21 NOTE — Telephone Encounter (Signed)
New Message  Pt wife calling to speak w/ RN- was offered APP appts- refused and requested to speak w/ RN. Please call back and discuss.

## 2015-04-22 NOTE — Telephone Encounter (Signed)
Returned pt wife call she sts that it is not a good time. She is currentltyat a doctor appt she will call back

## 2015-04-29 ENCOUNTER — Encounter (INDEPENDENT_AMBULATORY_CARE_PROVIDER_SITE_OTHER): Payer: Medicare Other | Admitting: Ophthalmology

## 2015-05-03 ENCOUNTER — Ambulatory Visit (INDEPENDENT_AMBULATORY_CARE_PROVIDER_SITE_OTHER): Payer: Medicare Other | Admitting: Internal Medicine

## 2015-05-03 ENCOUNTER — Encounter: Payer: Self-pay | Admitting: Internal Medicine

## 2015-05-03 VITALS — BP 118/76 | HR 103 | Ht 69.0 in | Wt 188.0 lb

## 2015-05-03 DIAGNOSIS — J8410 Pulmonary fibrosis, unspecified: Secondary | ICD-10-CM | POA: Diagnosis not present

## 2015-05-03 DIAGNOSIS — I10 Essential (primary) hypertension: Secondary | ICD-10-CM

## 2015-05-03 LAB — PULMONARY FUNCTION TEST
DL/VA % pred: 71 %
DL/VA: 3.22 ml/min/mmHg/L
DLCO unc % pred: 65 %
DLCO unc: 20.44 ml/min/mmHg
FEF 25-75 Post: 2.11 L/sec
FEF 25-75 Pre: 2.29 L/sec
FEF2575-%Change-Post: -7 %
FEF2575-%PRED-PRE: 130 %
FEF2575-%Pred-Post: 120 %
FEV1-%Change-Post: -2 %
FEV1-%PRED-PRE: 107 %
FEV1-%Pred-Post: 104 %
FEV1-POST: 2.77 L
FEV1-Pre: 2.83 L
FEV1FVC-%Change-Post: 0 %
FEV1FVC-%Pred-Pre: 107 %
FEV6-%Change-Post: -1 %
FEV6-%PRED-PRE: 104 %
FEV6-%Pred-Post: 103 %
FEV6-POST: 3.59 L
FEV6-Pre: 3.64 L
FEV6FVC-%Change-Post: 0 %
FEV6FVC-%PRED-POST: 105 %
FEV6FVC-%Pred-Pre: 105 %
FVC-%CHANGE-POST: -1 %
FVC-%Pred-Post: 97 %
FVC-%Pred-Pre: 98 %
FVC-POST: 3.64 L
FVC-Pre: 3.7 L
PRE FEV1/FVC RATIO: 77 %
PRE FEV6/FVC RATIO: 98 %
Post FEV1/FVC ratio: 76 %
Post FEV6/FVC ratio: 99 %
RV % pred: 89 %
RV: 2.37 L
TLC % pred: 86 %
TLC: 5.93 L

## 2015-05-03 NOTE — Progress Notes (Signed)
PFT done today. 

## 2015-05-03 NOTE — Progress Notes (Signed)
Subjective:     [name], male   DOB: 1930/08/03,    MRN: [id number]    Brief patient profile:  62 yowm with steroid dep RA f/u [facility] and PF f/u by [facility] previously.    11/03/2014 1st   office visit/ [name]  / on Altace  Chief Complaint  Patient presents with  . Follow-up    Former pt of [name]. Pt states that his breathing is doing well and denies any new co's today.   limited by back more than sob / arthritis good control since last ov here rec No change rx    05/03/2015  f/u ov/Daelynn Blower re:  RA/ pf on Altace  Chief Complaint  Patient presents with  . Follow-up    Breathing is doing well.  He has occ cough with "cloudy" sputum.    wife is hearing wheezing also but it doesn't bother him    No obvious day to day or daytime variability or assoc chronic cough or cp or chest tightness,   or overt sinus or hb symptoms. No unusual exp hx or h/o childhood pna/ asthma or knowledge of premature birth.  Sleeping ok without nocturnal  or early am exacerbation  of respiratory  c/o's or need for noct saba. Also denies any obvious fluctuation of symptoms with weather or environmental changes or other aggravating or alleviating factors except as outlined above   Current Medications, Allergies, Complete Past Medical History, Past Surgical History, Family History, and Social History were reviewed in [employer] record.  ROS  The following are not active complaints unless bolded sore throat, dysphagia, dental problems, itching, sneezing,  nasal congestion or excess/ purulent secretions, ear ache,   fever, chills, sweats, unintended wt loss, classically pleuritic or exertional cp, hemoptysis,  orthopnea pnd or leg swelling, presyncope, palpitations, abdominal pain, anorexia, nausea, vomiting, diarrhea  or change in bowel or bladder habits, change in stools or urine, dysuria,hematuria,  rash, arthralgias, visual complaints, headache, numbness, weakness or ataxia or  problems with walking or coordination,  change in mood/affect or memory.            Objective:   Physical Exam amb wm nad gruff voice/ freq throat clearing   05/03/2015         188   11/03/14 185 lb (83.915 kg)  05/18/14 190 lb (86.183 kg)  05/05/14 189 lb 9.6 oz (86.002 kg)    Vital signs reviewed  HEENT: nl dentition, turbinates, and orophanx. Nl external ear canals without cough reflex   NECK :  without JVD/Nodes/TM/ nl carotid upstrokes bilaterally   LUNGS: no acc muscle use,   Mid insp crackles both bases  without cough on insp or exp maneuvers   CV:  RRR  no s3 or murmur or increase in P2, no edema   ABD:  soft and nontender with nl excursion in the supine position. No bruits or organomegaly, bowel sounds nl  MS:  warm without deformities, calf tenderness, cyanosis or clubbing  SKIN: warm and dry without lesions    NEURO:  alert, approp, no deficits     I personally reviewed images and agree with radiology impression as follows:  CXR:   11/03/14  1. Stable severe changes of bilateral pulmonary interstitial fibrosis. 2. Stable cardiomegaly, no pulmonary venous congestion. No acute abnormality.        Assessment:

## 2015-05-03 NOTE — Assessment & Plan Note (Signed)
-   HRCT 04/10/14 1. The appearance of the lungs does indicate underlying interstitial lung disease, and the pattern is favored to reflect usual interstitial pneumonia (UIP). The findings described above have clearly progressed compared to remote prior study 03/26/2004 where the lung bases were incidentally imaged on a CT of the abdomen and pelvis. 2. In addition, there is diffuse bronchial wall thickening with moderate centrilobular and paraseptal emphysema; imaging findings suggesting concurrent COPD. - PFTs 04/30/14  VC  3.96 (104%)  s obstruction and dlco 66% corrects to 69% - 11/03/2014  Walked RA x 3 laps @ 185 ft each stopped due to  End of study, nl pace, no sob or desat   > ok to d/c 02  - PFT's  05/03/2015   FVC  3.70 (97%) no obst with DLCO  65 % corrects to 71 % for alv volume    No evidence of dz progression nor any airway problem.  The wheezing the wife is hearing is likely upper airway in origin and suspicious of an ACEi case (see separate a/p)   I had an extended discussion with the patient reviewing all relevant studies completed to date and  lasting 15 to 20 minutes of a 25 minute visit   Discussed natural hx of PF assoc with RA - see avs  Each maintenance medication was reviewed in detail including most importantly the difference between maintenance and prns and under what circumstances the prns are to be triggered using an action plan format that is not reflected in the computer generated alphabetically organized AVS.    Please see instructions for details which were reviewed in writing and the patient given a copy highlighting the part that I personally wrote and discussed at today's ov.

## 2015-05-03 NOTE — Assessment & Plan Note (Signed)
In the best review of chronic cough to date ( NEJM 2016 375 [phone]) ,  ACEi are now felt to cause cough in up to  20% of pts which is a 4 fold increase from previous reports and does not include the variety of non-specific complaints we see in pulmonary clinic in pts on ACEi but previously attributed to another dx like  Copd/asthma(the wheezing he has had is very likely upper airway) and  include PNDS, throat and chest congestion, "bronchitis", unexplained dyspnea and noct "strangling" sensations, and hoarseness, but also  atypical /refractory GERD symptoms like dysphagia and "bad heartburn"   The only way I know  to prove this is not an "ACEi Case" is a trial off ACEi x a minimum of 6 weeks then regroup > defer to Dr Tamala Julian

## 2015-05-03 NOTE — Patient Instructions (Addendum)
Your pulmonary fibrosis is likely due to your rheumatism and as long as this is well controlled your fibrosis should not get worse  See [name] re  Your Altace - the only way to know to what extent it's causing your voice/ cough/ throat congestion and urge to clear your throat   Please schedule a follow up visit in 12 months but call sooner if needed with pfts and cxr on return

## 2015-05-10 ENCOUNTER — Encounter (INDEPENDENT_AMBULATORY_CARE_PROVIDER_SITE_OTHER): Payer: Medicare Other | Admitting: Ophthalmology

## 2015-05-10 DIAGNOSIS — Q253 Supravalvular aortic stenosis: Secondary | ICD-10-CM | POA: Diagnosis not present

## 2015-05-10 DIAGNOSIS — Z79899 Other long term (current) drug therapy: Secondary | ICD-10-CM | POA: Diagnosis not present

## 2015-05-10 DIAGNOSIS — Z1389 Encounter for screening for other disorder: Secondary | ICD-10-CM | POA: Diagnosis not present

## 2015-05-10 DIAGNOSIS — I714 Abdominal aortic aneurysm, without rupture: Secondary | ICD-10-CM | POA: Diagnosis not present

## 2015-05-10 DIAGNOSIS — M069 Rheumatoid arthritis, unspecified: Secondary | ICD-10-CM | POA: Diagnosis not present

## 2015-05-10 DIAGNOSIS — J8410 Pulmonary fibrosis, unspecified: Secondary | ICD-10-CM | POA: Diagnosis not present

## 2015-05-10 DIAGNOSIS — E7800 Pure hypercholesterolemia, unspecified: Secondary | ICD-10-CM | POA: Diagnosis not present

## 2015-05-10 DIAGNOSIS — Z0000 Encounter for general adult medical examination without abnormal findings: Secondary | ICD-10-CM | POA: Diagnosis not present

## 2015-05-10 DIAGNOSIS — I10 Essential (primary) hypertension: Secondary | ICD-10-CM | POA: Diagnosis not present

## 2015-05-11 ENCOUNTER — Encounter (INDEPENDENT_AMBULATORY_CARE_PROVIDER_SITE_OTHER): Payer: Medicare Other | Admitting: Ophthalmology

## 2015-05-11 DIAGNOSIS — H353122 Nonexudative age-related macular degeneration, left eye, intermediate dry stage: Secondary | ICD-10-CM

## 2015-05-11 DIAGNOSIS — H35033 Hypertensive retinopathy, bilateral: Secondary | ICD-10-CM

## 2015-05-11 DIAGNOSIS — Z79899 Other long term (current) drug therapy: Secondary | ICD-10-CM | POA: Diagnosis not present

## 2015-05-11 DIAGNOSIS — H43813 Vitreous degeneration, bilateral: Secondary | ICD-10-CM | POA: Diagnosis not present

## 2015-05-11 DIAGNOSIS — M0609 Rheumatoid arthritis without rheumatoid factor, multiple sites: Secondary | ICD-10-CM

## 2015-05-11 DIAGNOSIS — I10 Essential (primary) hypertension: Secondary | ICD-10-CM

## 2015-05-19 ENCOUNTER — Encounter: Payer: Self-pay | Admitting: Family

## 2015-05-24 ENCOUNTER — Ambulatory Visit (INDEPENDENT_AMBULATORY_CARE_PROVIDER_SITE_OTHER): Payer: Medicare Other | Admitting: Family

## 2015-05-24 ENCOUNTER — Encounter: Payer: Self-pay | Admitting: Family

## 2015-05-24 ENCOUNTER — Ambulatory Visit (HOSPITAL_COMMUNITY)
Admission: RE | Admit: 2015-05-24 | Discharge: 2015-05-24 | Disposition: A | Discharge status: Home / Self Care | Payer: Medicare Other | Source: Ambulatory Visit | Attending: Family | Admitting: Family

## 2015-05-24 VITALS — BP 130/83 | HR 70 | Ht 69.0 in | Wt 189.1 lb

## 2015-05-24 DIAGNOSIS — I10 Essential (primary) hypertension: Secondary | ICD-10-CM | POA: Insufficient documentation

## 2015-05-24 DIAGNOSIS — Z87891 Personal history of nicotine dependence: Secondary | ICD-10-CM | POA: Diagnosis not present

## 2015-05-24 DIAGNOSIS — I714 Abdominal aortic aneurysm, without rupture, unspecified: Secondary | ICD-10-CM

## 2015-05-24 NOTE — Addendum Note (Signed)
Addended by: Mena Goes on: 05/24/2015 10:38 AM   Modules accepted: Orders

## 2015-05-24 NOTE — Progress Notes (Signed)
[facility]   CC: Follow up Abdominal Aortic Aneurysm  History of Present Illness  [name] is a 82 y.o. (1931-01-12) male that Dr. Kellie Simmering has been monitoring for AAA and he presents today for: follow up of AAA. Previous studies demonstrate an AAA, measuring 4.32 cm (05/01/2012); 2003 CT showed 3.5 cm as largest diameter. The patient does have chronic back pain which wife attributes to generalized RA, denies abdominal pain. The patient is a former smoker. The patient denies claudication in legs with walking. The patient denies history of stroke or TIA symptoms. [place], seem fairly physically active. Pt was diagnosed in February 2016 with pulmonary fibrosis, wife states this may be secondary to his RA, gradual progression, followed by Dr. Gwenette Greet.   Pt Diabetic: No Pt smoker: former smoker, quit in the 1980's  Past Medical History  Diagnosis Date  . AAA (abdominal aortic aneurysm) ([place])   . Staph skin infection     Back  . Immune disorder ([place])   . Myocardial infarction ([place]) 2000  . Shortness of breath   . Arthritis     ra  . Hypertension     dr Daneen Schick  . Hematuria, gross     HISTORY OF  ?  2004  . Atrial fibrillation ([place])   . Coronary atherosclerosis   . Rheumatoid arthritis ([place])   . COPD (chronic obstructive pulmonary disease) ([place]) Jan.2016    Small amount of Emphysema- Pulmonary Fibrosis ( Feb. 9, 2016 )   Past Surgical History  Procedure Laterality Date  . Stents  2000  . Back surgery    . Hand surgery      rel to staph  . Total knee arthroplasty Right 01/20/2013    Procedure: RIGHT TOTAL KNEE ARTHROPLASTY;  Surgeon: Kerin Salen, MD;  Location: [facility];  Service: Orthopedics;  Laterality: Right;  . Esophagogastroduodenoscopy  2004    ?  Marland Kitchen Eye surgery Left 03-14-13    [place]  . Joint replacement      bil hips  . Joint replacement Right 01-20-13    Knee   Social History Social History   Social History  . Marital  Status: Married    Spouse Name: N/A  . Number of Children: N/A  . Years of Education: N/A   Occupational History  . Not on file.   Social History Main Topics  . Smoking status: Former Smoker -- 3.00 packs/day for 50 years    Types: Cigarettes    Quit date: 03/27/1978  . Smokeless tobacco: Former Systems developer    Types: Chew  . Alcohol Use: No  . Drug Use: No  . Sexual Activity: Not on file   Other Topics Concern  . Not on file   Social History Narrative   Family History Family History  Problem Relation Age of Onset  . Heart disease Brother     Heart Disease before age 52  . Stroke Brother   . Heart attack Brother   . Hypertension Brother   . Varicose Veins Sister   . Diabetes Son   . Heart disease Son     Heart Disease before age 89  . Hypertension Son   . Heart attack Son   . COPD Sister   . Bleeding Disorder Father   . COPD Father     Emphysema    Current Outpatient Prescriptions on File Prior to Visit  Medication Sig Dispense Refill  . aspirin 81 MG tablet Take 81 mg by  mouth daily.    Marland Kitchen atorvastatin (LIPITOR) 40 MG tablet Take 40 mg by mouth daily.    . calcium carbonate (OS-CAL) 600 MG TABS Take 600 mg by mouth daily.     . diclofenac sodium (VOLTAREN) 1 % GEL Apply 1 application topically 2 (two) times daily as needed (joint/muscle pain).     . hydroxychloroquine (PLAQUENIL) 200 MG tablet Take 200 mg by mouth 2 (two) times daily.     . meloxicam (MOBIC) 15 MG tablet Take 15 mg by mouth daily as needed for pain.    . metoprolol tartrate (LOPRESSOR) 25 MG tablet Take 25 mg by mouth 2 (two) times daily.    . Multiple Vitamin (MULTIVITAMIN) capsule Take 1 capsule by mouth daily.      . Multiple Vitamins-Minerals (OCUVITE PRESERVISION PO) Take 1 tablet by mouth daily.    . nitroGLYCERIN (NITROSTAT) 0.4 MG SL tablet Place 1 tablet (0.4 mg total) under the tongue every 5 (five) minutes as needed for chest pain. 25 tablet 3  . omeprazole (PRILOSEC) 20 MG capsule Take 20 mg  by mouth daily.      . predniSONE (DELTASONE) 5 MG tablet Take 5 mg by mouth daily.      Marland Kitchen Propylene Glycol (SYSTANE BALANCE OP) Place 1 drop into both eyes 2 (two) times daily.    . ramipril (ALTACE) 2.5 MG tablet Take 2.5 mg by mouth daily.      Marland Kitchen sulfaSALAzine (AZULFIDINE) 500 MG tablet Take 500 mg by mouth 4 (four) times daily.    . Tamsulosin HCl (FLOMAX) 0.4 MG CAPS Take 0.4 mg by mouth daily.      No current facility-administered medications on file prior to visit.   Allergies  Allergen Reactions  . Morphine And Related Nausea And Vomiting and Other (See Comments)    Hallucinations   . Morphine Other (See Comments)    "makes me crazy"    ROS: See HPI for pertinent positives and negatives.  Physical Examination  Filed Vitals:   05/24/15 0908  BP: 130/83  Pulse: 70  Height: 5\' 9"  (1.753 m)  Weight: 189 lb 1.6 oz (85.775 kg)  SpO2: 95%   Body mass index is 27.91 kg/(m^2).   General: A&O x 3, WD, male.  Pulmonary: Sym exp, good air movt, CTAB, rales in both bases, left more so than right, no rhonchi, or wheezing.  Cardiac: RRR, Nl S1, S2, + murmur.   Carotid Bruits Left Right   Negative Negative  Aorta is not palpable Radial pulses are 2+ and =   VASCULAR EXAM:     LE Pulses LEFT RIGHT   FEMORAL 2+ palpable 2+ palpable   POPLITEAL not palpable  not palpable   POSTERIOR TIBIAL not palpable   not palpable    DORSALIS PEDIS  ANTERIOR TIBIAL palpable  faintly palpable     Gastrointestinal: soft, NTND, -G/R, - HSM, - palpable masses, - CVAT B.  Musculoskeletal: M/S 4/5 throughout, Extremities without ischemic changes.  Neurologic: CN 2-12 grossly intact except is hard of hearing, Motor exam as listed above.               Non-Invasive Vascular Imaging  AAA Duplex (05/24/2015)  Previous size: 4.43 cm (Date: 05/18/14)  Current size:  4.6 cm (Date: 05/24/2015)  Medical Decision Making  The patient is a 82 y.o. male who presents with asymptomatic AAA with slight increase in size, from 4.43 cm a year ago to 4.6 cm as largest diameter today.  Based on this patient's exam and diagnostic studies, the patient will follow up in 6 months  with the following studies: AAA duplex.  Consideration for repair of AAA would be made when the size is 5.5 cm, growth > 1 cm/yr, and symptomatic status.  I emphasized the importance of maximal medical management including strict control of blood pressure, blood glucose, and lipid levels, antiplatelet agents, obtaining regular exercise, and continued cessation of smoking.   The patient was given information about AAA including signs, symptoms, treatment, and how to minimize the risk of enlargement and rupture of aneurysms.    The patient was advised to call 911 should the patient experience sudden onset abdominal or back pain.   Thank you for allowing [place] to participate in this patient's care.  Clemon Chambers, RN, MSN, FNP-C [facility] [place] Office: [facility] Physician: Trula Slade  05/24/2015, 9:03 AM

## 2015-05-24 NOTE — Patient Instructions (Signed)
Abdominal Aortic Aneurysm An aneurysm is a weakened or damaged part of an artery wall that bulges from the normal force of blood pumping through the body. An abdominal aortic aneurysm is an aneurysm that occurs in the lower part of the aorta, the main artery of the body.  The major concern with an abdominal aortic aneurysm is that it can enlarge and burst (rupture) or blood can flow between the layers of the wall of the aorta through a tear (aorticdissection). Both of these conditions can cause bleeding inside the body and can be life threatening unless diagnosed and treated promptly. CAUSES  The exact cause of an abdominal aortic aneurysm is unknown. Some contributing factors are:   A hardening of the arteries caused by the buildup of fat and other substances in the lining of a blood vessel (arteriosclerosis).  Inflammation of the walls of an artery (arteritis).   Connective tissue diseases, such as Marfan syndrome.   Abdominal trauma.   An infection, such as syphilis or staphylococcus, in the wall of the aorta (infectious aortitis) caused by bacteria. RISK FACTORS  Risk factors that contribute to an abdominal aortic aneurysm may include:  Age older than 60 years.   High blood pressure (hypertension).  Male gender.  Ethnicity (white race).  Obesity.  Family history of aneurysm (first degree relatives only).  Tobacco use. PREVENTION  The following healthy lifestyle habits may help decrease your risk of abdominal aortic aneurysm:  Quitting smoking. Smoking can raise your blood pressure and cause arteriosclerosis.  Limiting or avoiding alcohol.  Keeping your blood pressure, blood sugar level, and cholesterol levels within normal limits.  Decreasing your salt intake. In somepeople, too much salt can raise blood pressure and increase your risk of abdominal aortic aneurysm.  Eating a diet low in saturated fats and cholesterol.  Increasing your fiber intake by including  whole grains, vegetables, and fruits in your diet. Eating these foods may help lower blood pressure.  Maintaining a healthy weight.  Staying physically active and exercising regularly. SYMPTOMS  The symptoms of abdominal aortic aneurysm may vary depending on the size and rate of growth of the aneurysm.Most grow slowly and do not have any symptoms. When symptoms do occur, they may include:  Pain (abdomen, side, lower back, or groin). The pain may vary in intensity. A sudden onset of severe pain may indicate that the aneurysm has ruptured.  Feeling full after eating only small amounts of food.  Nausea or vomiting or both.  Feeling a pulsating lump in the abdomen.  Feeling faint or passing out. DIAGNOSIS  Since most unruptured abdominal aortic aneurysms have no symptoms, they are often discovered during diagnostic exams for other conditions. An aneurysm may be found during the following procedures:  Ultrasonography (A one-time screening for abdominal aortic aneurysm by ultrasonography is also recommended for all men aged 65-75 years who have ever smoked).  X-ray exams.  A computed tomography (CT).  Magnetic resonance imaging (MRI).  Angiography or arteriography. TREATMENT  Treatment of an abdominal aortic aneurysm depends on the size of your aneurysm, your age, and risk factors for rupture. Medication to control blood pressure and pain may be used to manage aneurysms smaller than 6 cm. Regular monitoring for enlargement may be recommended by your caregiver if:  The aneurysm is 3-4 cm in size (an annual ultrasonography may be recommended).  The aneurysm is 4-4.5 cm in size (an ultrasonography every 6 months may be recommended).  The aneurysm is larger than 4.5 cm in   size (your caregiver may ask that you be examined by a vascular surgeon). If your aneurysm is larger than 6 cm, surgical repair may be recommended. There are two main methods for repair of an aneurysm:   Endovascular  repair (a minimally invasive surgery). This is done most often.  Open repair. This method is used if an endovascular repair is not possible.   This information is not intended to replace advice given to you by your health care provider. Make sure you discuss any questions you have with your health care provider.   Document Released: 12/21/2004 Document Revised: 07/08/2012 Document Reviewed: 04/12/2012 Elsevier Interactive Patient Education 2016 [employer]  

## 2015-06-28 ENCOUNTER — Encounter: Payer: Self-pay | Admitting: Interventional Cardiology

## 2015-06-28 ENCOUNTER — Ambulatory Visit (INDEPENDENT_AMBULATORY_CARE_PROVIDER_SITE_OTHER): Payer: Medicare Other | Admitting: Interventional Cardiology

## 2015-06-28 VITALS — BP 110/64 | HR 72 | Ht 70.0 in | Wt 198.0 lb

## 2015-06-28 DIAGNOSIS — I714 Abdominal aortic aneurysm, without rupture, unspecified: Secondary | ICD-10-CM

## 2015-06-28 DIAGNOSIS — I480 Paroxysmal atrial fibrillation: Secondary | ICD-10-CM

## 2015-06-28 DIAGNOSIS — I2510 Atherosclerotic heart disease of native coronary artery without angina pectoris: Secondary | ICD-10-CM | POA: Diagnosis not present

## 2015-06-28 DIAGNOSIS — E785 Hyperlipidemia, unspecified: Secondary | ICD-10-CM

## 2015-06-28 NOTE — Progress Notes (Signed)
Cardiology Office Note   Date:  06/28/2015   ID:  [name], DOB 06-27-1930, MRN [id number]  PCP:  [name] Argyle, MD  Cardiologist:  [name] Grooms, MD   Chief Complaint  Patient presents with  . Coronary Artery Disease      History of Present Illness: [name] is a 82 y.o. male who presents for Coronary artery disease with bare-metal stent right coronary, abdominal aortic aneurysm followed by Dr. Kellie [name], hypertension, and interstitial lung disease.  I have not seen [name] in quite some time. From coronary artery disease/ischemic heart disease standpoint he is doing quite well. He has not had angina. His family is concerned about his current medical regimen. [name] is recommended discontinuing Ramipril. The patient has chronic interstitial lung disease. Family is also concerned about the recommendation to discontinue his that he had. Apparently, recent lipid panel was in excellent condition when done by [name]. This is with the patient taking atorvastatin only.    Past Medical History  Diagnosis Date  . AAA (abdominal aortic aneurysm) ([place])   . Staph skin infection     Back  . Immune disorder ([place])   . Myocardial infarction ([place]) 2000  . Shortness of breath   . Arthritis     ra  . Hypertension     dr [name]  . Hematuria, gross     HISTORY OF  ?  2004  . Atrial fibrillation ([place])   . Coronary atherosclerosis   . Rheumatoid arthritis ([place])   . COPD (chronic obstructive pulmonary disease) ([place]) Jan.2016    Small amount of Emphysema- Pulmonary Fibrosis ( Feb. 9, 2016 )  . Pulmonary fibrosis [facility])     Past Surgical History  Procedure Laterality Date  . Stents  2000  . Back surgery    . Hand surgery      rel to staph  . Total knee arthroplasty Right 01/20/2013    Procedure: RIGHT TOTAL KNEE ARTHROPLASTY;  Surgeon: [name] Salen, MD;  Location: [facility];  Service: Orthopedics;  Laterality: Right;  . Esophagogastroduodenoscopy  2004     ?  Marland Kitchen Eye surgery Left 03-14-13    [place]  . Joint replacement      bil hips  . Joint replacement Right 01-20-13    Knee     Current Outpatient Prescriptions  Medication Sig Dispense Refill  . aspirin 81 MG tablet Take 81 mg by mouth daily.    Marland Kitchen atorvastatin (LIPITOR) 40 MG tablet Take 40 mg by mouth daily.    . calcium carbonate (OS-CAL) 600 MG TABS Take 600 mg by mouth daily.     . diclofenac sodium (VOLTAREN) 1 % GEL Apply 1 application topically 2 (two) times daily as needed (joint/muscle pain).     . hydroxychloroquine (PLAQUENIL) 200 MG tablet Take 200 mg by mouth 2 (two) times daily.     . meloxicam (MOBIC) 15 MG tablet Take 15 mg by mouth daily as needed for pain.    . metoprolol tartrate (LOPRESSOR) 25 MG tablet Take 25 mg by mouth 2 (two) times daily.    . Multiple Vitamin (MULTIVITAMIN) capsule Take 1 capsule by mouth daily.      . Multiple Vitamins-Minerals (OCUVITE PRESERVISION PO) Take 1 tablet by mouth daily.    . nitroGLYCERIN (NITROSTAT) 0.4 MG SL tablet Place 1 tablet (0.4 mg total) under the tongue every 5 (five) minutes as needed for chest pain. 25 tablet 3  . omeprazole (  PRILOSEC) 20 MG capsule Take 20 mg by mouth daily.      . predniSONE (DELTASONE) 5 MG tablet Take 5 mg by mouth daily.      Marland Kitchen Propylene Glycol (SYSTANE BALANCE OP) Place 1 drop into both eyes 2 (two) times daily.    . ramipril (ALTACE) 2.5 MG tablet Take 2.5 mg by mouth daily.      Marland Kitchen sulfaSALAzine (AZULFIDINE) 500 MG tablet Take 500 mg by mouth 4 (four) times daily.    . Tamsulosin HCl (FLOMAX) 0.4 MG CAPS Take 0.4 mg by mouth daily.      No current facility-administered medications for this visit.    Allergies:   Morphine and related and Morphine    Social History:  The patient  reports that he quit smoking about 37 years ago. His smoking use included Cigarettes. He has a 150 pack-year smoking history. He has quit using smokeless tobacco. His smokeless tobacco use included Chew. He reports  that he does not drink alcohol or use illicit drugs.   Family History:  The patient's family history includes Bleeding Disorder in his father; COPD in his father and sister; Diabetes in his son; Heart attack in his brother and son; Heart disease in his brother and son; Hypertension in his brother and son; Stroke in his brother; Varicose Veins in his sister.    ROS:  Please see the history of present illness.   Otherwise, review of systems are positive for Chronic dyspnea on exertion, cough, muscle aches and joint deformity..   All other systems are reviewed and negative.    PHYSICAL EXAM: VS:  BP 110/64 mmHg  Pulse 72  Ht 5\' 10"  (1.778 m)  Wt 198 lb (89.812 kg)  BMI 28.41 kg/m2 , BMI Body mass index is 28.41 kg/(m^2). GEN: Well nourished, well developed, in no acute distress HEENT: normal Neck: no JVD, carotid bruits, or masses Cardiac: RRR.  There is a 2/6 systolic right upper sternal border murmur. There is no rub, or gallop. There is no edema. Respiratory:  clear to auscultation bilaterally, normal work of breathing. GI: soft, nontender, nondistended, + BS MS: no deformity or atrophy Skin: warm and dry, no rash Neuro:  Strength and sensation are intact Psych: euthymic mood, full affect   EKG:  EKG is ordered today. The ekg reveals normal sinus rhythm with PVCs. No acute abnormality is seen.   Recent Labs: No results found for requested labs within last 365 days.    Lipid Panel No results found for: CHOL, TRIG, HDL, CHOLHDL, VLDL, LDLCALC, LDLDIRECT    Wt Readings from Last 3 Encounters:  06/28/15 198 lb (89.812 kg)  05/24/15 189 lb 1.6 oz (85.775 kg)  05/03/15 188 lb (85.276 kg)      Other studies Reviewed: Additional studies/ records that were reviewed today include: None to review. The findings include none to review.    ASSESSMENT AND PLAN:  1. Coronary artery disease involving native coronary artery of native heart without angina pectoris Asymptomatic  2.  Paroxysmal atrial fibrillation (HCC) No recent documentation of a half  3. Abdominal aortic aneurysm (AAA) without rupture (HCC) 4.6 cm with a recently evaluated. Last recording 4.43 cm one year ago. This is followed by Dr. Kellie [name].  4. Hyperlipidemia Followed by primary care. Currently taking only atorvastatin and stable.    Current medicines are reviewed at length with the patient today.  The patient has the following concerns regarding medicines: It is my recommendation to continue off Zetia if  LDL cholesterol is less than 100. I also have no problem with ramipril discontinuation..  The following changes/actions have been instituted:    Discontinue ramipril  Discontinue Zetia  Follow-up 1 year  Target blood pressure less than 140/90 mmHg  Labs/ tests ordered today include:  No orders of the defined types were placed in this encounter.     Disposition:   FU with HS in 1 year  Signed, [name] Grooms, MD  06/28/2015 8:23 AM    [address], [place],   [address] Phone: [phone]; Fax: [phone]

## 2015-06-28 NOTE — Patient Instructions (Addendum)
Medication Instructions:  Your physician has recommended you make the following change in your medication:  STOP Ramipril   Labwork: None ordered  Testing/Procedures: None ordered  Follow-Up: Your physician wants you to follow-up in: 1 year with Dr.Smith You will receive a reminder letter in the mail two months in advance. If you don't receive a letter, please call our office to schedule the follow-up appointment.   Any Other Special Instructions Will Be Listed Below (If Applicable). Please monitor your blood pressure regularly. Your blood pressure goal should be <140/90     If you need a refill on your cardiac medications before your next appointment, please call your pharmacy.

## 2015-08-10 DIAGNOSIS — Z79899 Other long term (current) drug therapy: Secondary | ICD-10-CM | POA: Diagnosis not present

## 2015-08-10 DIAGNOSIS — M2550 Pain in unspecified joint: Secondary | ICD-10-CM | POA: Diagnosis not present

## 2015-08-10 DIAGNOSIS — M0579 Rheumatoid arthritis with rheumatoid factor of multiple sites without organ or systems involvement: Secondary | ICD-10-CM | POA: Diagnosis not present

## 2015-08-10 DIAGNOSIS — J8410 Pulmonary fibrosis, unspecified: Secondary | ICD-10-CM | POA: Diagnosis not present

## 2015-11-17 ENCOUNTER — Encounter: Payer: Self-pay | Admitting: Family

## 2015-11-22 ENCOUNTER — Ambulatory Visit (INDEPENDENT_AMBULATORY_CARE_PROVIDER_SITE_OTHER): Payer: Medicare Other | Admitting: Family

## 2015-11-22 ENCOUNTER — Encounter: Payer: Self-pay | Admitting: Family

## 2015-11-22 ENCOUNTER — Other Ambulatory Visit: Payer: Self-pay | Admitting: Family

## 2015-11-22 ENCOUNTER — Ambulatory Visit (HOSPITAL_COMMUNITY)
Admission: RE | Admit: 2015-11-22 | Discharge: 2015-11-22 | Disposition: A | Discharge status: Home / Self Care | Payer: Medicare Other | Source: Ambulatory Visit | Attending: Family | Admitting: Family

## 2015-11-22 VITALS — BP 136/74 | HR 62 | Temp 97.2°F | Resp 14 | Ht 70.5 in | Wt 187.0 lb

## 2015-11-22 DIAGNOSIS — I714 Abdominal aortic aneurysm, without rupture, unspecified: Secondary | ICD-10-CM

## 2015-11-22 DIAGNOSIS — Z87891 Personal history of nicotine dependence: Secondary | ICD-10-CM

## 2015-11-22 DIAGNOSIS — I7409 Other arterial embolism and thrombosis of abdominal aorta: Secondary | ICD-10-CM | POA: Insufficient documentation

## 2015-11-22 DIAGNOSIS — I2510 Atherosclerotic heart disease of native coronary artery without angina pectoris: Secondary | ICD-10-CM

## 2015-11-22 NOTE — Progress Notes (Signed)
[facility] [place]   CC: Follow up Abdominal Aortic Aneurysm  History of Present Illness  [name] is a 82 y.o. (1930/08/26) male whom Dr. Kellie Simmering has been monitoring for AAA and he presents today for: follow up of AAA. Previous studies demonstrate an AAA, measuring 4.32 cm (05/01/2012); 2003 CT showed 3.5 cm as largest diameter. The patient does have chronic back pain which wife attributes to generalized RA, denies abdominal pain, denies any new back pain, denies abdominal pain. The patient is a former smoker. The patient denies claudication in legs with walking. The patient denies history of stroke or TIA symptoms. [place], seem fairly physically active. Pt was diagnosed in February 2016 with pulmonary fibrosis, followed by Dr. [name]. He denies feeling any more dyspneic than usual, denies any worse cough than usual.  He has RA, takes prednisone for this.   Pt Diabetic: No Pt smoker: former smoker, quit in the 1980's  Past Medical History:  Diagnosis Date  . AAA (abdominal aortic aneurysm) ([place])   . Arthritis    ra  . Atrial fibrillation ([place])   . COPD (chronic obstructive pulmonary disease) ([place]) Jan.2016   Small amount of Emphysema- Pulmonary Fibrosis ( Feb. 9, 2016 )  . Coronary atherosclerosis   . Hematuria, gross    HISTORY OF  ?  2004  . Hypertension    dr Daneen Schick  . Immune disorder ([place])   . Myocardial infarction ([place]) 2000  . Pulmonary fibrosis ([place])   . Rheumatoid arthritis ([place])   . Shortness of breath   . Staph skin infection    Back   Past Surgical History:  Procedure Laterality Date  . BACK SURGERY    . ESOPHAGOGASTRODUODENOSCOPY  2004    ?  Marland Kitchen EYE SURGERY Left 03-14-13   [place]  . HAND SURGERY     rel to staph  . JOINT REPLACEMENT     bil hips  . JOINT REPLACEMENT Right 01-20-13   Knee  . stents  2000  . TOTAL KNEE ARTHROPLASTY Right 01/20/2013   Procedure: RIGHT TOTAL KNEE ARTHROPLASTY;  Surgeon: Kerin Salen,  MD;  Location: [facility];  Service: Orthopedics;  Laterality: Right;   Social History Social History   Social History  . Marital status: Married    Spouse name: N/A  . Number of children: N/A  . Years of education: N/A   Occupational History  . Not on file.   Social History Main Topics  . Smoking status: Former Smoker    Packs/day: 3.00    Years: 50.00    Types: Cigarettes    Quit date: 03/27/1978  . Smokeless tobacco: Former Systems developer    Types: Chew  . Alcohol use No  . Drug use: No  . Sexual activity: Not on file   Other Topics Concern  . Not on file   Social History Narrative  . No narrative on file   Family History Family History  Problem Relation Age of Onset  . Heart disease Brother     Heart Disease before age 23  . Stroke Brother   . Heart attack Brother   . Hypertension Brother   . Varicose Veins Sister   . Diabetes Son   . Heart disease Son     Heart Disease before age 81  . Hypertension Son   . Heart attack Son   . COPD Sister   . Bleeding Disorder Father   . COPD Father     Emphysema  Current Outpatient Prescriptions on File Prior to Visit  Medication Sig Dispense Refill  . aspirin 81 MG tablet Take 81 mg by mouth daily.    Marland Kitchen atorvastatin (LIPITOR) 40 MG tablet Take 40 mg by mouth daily.    . calcium carbonate (OS-CAL) 600 MG TABS Take 600 mg by mouth daily.     . hydroxychloroquine (PLAQUENIL) 200 MG tablet Take 200 mg by mouth 2 (two) times daily.     . meloxicam (MOBIC) 15 MG tablet Take 15 mg by mouth daily as needed for pain.    . metoprolol tartrate (LOPRESSOR) 25 MG tablet Take 25 mg by mouth 2 (two) times daily.    . Multiple Vitamin (MULTIVITAMIN) capsule Take 1 capsule by mouth daily.      . Multiple Vitamins-Minerals (OCUVITE PRESERVISION PO) Take 1 tablet by mouth daily.    Marland Kitchen omeprazole (PRILOSEC) 20 MG capsule Take 20 mg by mouth daily.      . predniSONE (DELTASONE) 5 MG tablet Take 5 mg by mouth daily.      Marland Kitchen Propylene Glycol (SYSTANE  BALANCE OP) Place 1 drop into both eyes 2 (two) times daily.    Marland Kitchen sulfaSALAzine (AZULFIDINE) 500 MG tablet Take 500 mg by mouth 4 (four) times daily.    . Tamsulosin HCl (FLOMAX) 0.4 MG CAPS Take 0.4 mg by mouth daily.     . diclofenac sodium (VOLTAREN) 1 % GEL Apply 1 application topically 2 (two) times daily as needed (joint/muscle pain).     . nitroGLYCERIN (NITROSTAT) 0.4 MG SL tablet Place 1 tablet (0.4 mg total) under the tongue every 5 (five) minutes as needed for chest pain. (Patient not taking: Reported on 11/22/2015) 25 tablet 3   No current facility-administered medications on file prior to visit.    Allergies  Allergen Reactions  . Morphine And Related Nausea And Vomiting and Other (See Comments)    Hallucinations   . Morphine Other (See Comments)    "makes me crazy"    ROS: See HPI for pertinent positives and negatives.  Physical Examination  Vitals:   11/22/15 0837  BP: 136/74  Pulse: 62  Resp: 14  Temp: 97.2 F (36.2 C)  TempSrc: Oral  SpO2: 97%  Weight: 187 lb (84.8 kg)  Height: 5' 10.5" (1.791 m)   Body mass index is 26.45 kg/m.  General: A&O x 3, WD, male.  Pulmonary: Sym exp, good air movt, respirations are non labored, CTAB, rales in left base, no rhonchi, or wheezing.  Cardiac: RRR, Nl S1, S2, + murmur.   Carotid Bruits Left Right   Negative Negative  Aorta is moderately palpable Radial pulses are 2+ and =   VASCULAR EXAM:     LE Pulses LEFT RIGHT   FEMORAL 2+ palpable 2+ palpable   POPLITEAL 2+ palpable  3+ palpable   POSTERIOR TIBIAL 2+ palpable   2+ palpable    DORSALIS PEDIS  ANTERIOR TIBIAL 2+lpable  2+ palpable     Gastrointestinal: soft, NTND, -G/R, - HSM, - palpable masses, - CVAT  B.  Musculoskeletal: M/S 5/5 throughout, Extremities without ischemic changes. RA changes in hands and feet. Skin is thin and friable.  Neurologic: CN 2-12 grossly intact except is hard of hearing, Motor exam as listed above.   Non-Invasive Vascular Imaging  AAA Duplex (11/22/2015)  Previous size: 4.6 cm x 4.6 cm (Date: 05/24/15)  Current size:  5.14 cm x 4.9 cm (Date: 11/22/15)  Medical Decision Making  The patient is a 82  y.o. male who presents with asymptomatic AAA with 5 mm increase in size in 6 months, largest diameter today at 5.14 cm. At some point soon, consider duplex of both popliteal arteries: prominent popliteal pulses.   Based on this patient's exam and diagnostic studies, the patient will follow up in 2-4 weeks  with the following studies: CTA abd/pelvis, see Dr,. Donzetta Matters afterward for discussion of results.  Consideration for repair of AAA would be made when the size is 5.5 cm, growth > 1 cm/yr, and symptomatic status.  I emphasized the importance of maximal medical management including strict control of blood pressure, blood glucose, and lipid levels, antiplatelet agents, obtaining regular exercise, and continued cessation of smoking.   The patient was given information about AAA including [name], symptoms, treatment, and how to minimize the risk of enlargement and rupture of aneurysms.    The patient was advised to call 911 should the patient experience sudden onset abdominal or back pain.   Thank you for allowing [place] to participate in this patient's care.  Clemon Chambers, RN, MSN, FNP-C [facility] [place] Office: [phone]   11/22/2015, 8:53 AM

## 2015-11-22 NOTE — Patient Instructions (Signed)
Abdominal Aortic Aneurysm An aneurysm is a weakened or damaged part of an artery wall that bulges from the normal force of blood pumping through the body. An abdominal aortic aneurysm is an aneurysm that occurs in the lower part of the aorta, the main artery of the body.  The major concern with an abdominal aortic aneurysm is that it can enlarge and burst (rupture) or blood can flow between the layers of the wall of the aorta through a tear (aorticdissection). Both of these conditions can cause bleeding inside the body and can be life threatening unless diagnosed and treated promptly. CAUSES  The exact cause of an abdominal aortic aneurysm is unknown. Some contributing factors are:   A hardening of the arteries caused by the buildup of fat and other substances in the lining of a blood vessel (arteriosclerosis).  Inflammation of the walls of an artery (arteritis).   Connective tissue diseases, such as Marfan syndrome.   Abdominal trauma.   An infection, such as syphilis or staphylococcus, in the wall of the aorta (infectious aortitis) caused by bacteria. RISK FACTORS  Risk factors that contribute to an abdominal aortic aneurysm may include:  Age older than 60 years.   High blood pressure (hypertension).  Male gender.  Ethnicity (white race).  Obesity.  Family history of aneurysm (first degree relatives only).  Tobacco use. PREVENTION  The following healthy lifestyle habits may help decrease your risk of abdominal aortic aneurysm:  Quitting smoking. Smoking can raise your blood pressure and cause arteriosclerosis.  Limiting or avoiding alcohol.  Keeping your blood pressure, blood sugar level, and cholesterol levels within normal limits.  Decreasing your salt intake. In somepeople, too much salt can raise blood pressure and increase your risk of abdominal aortic aneurysm.  Eating a diet low in saturated fats and cholesterol.  Increasing your fiber intake by including  whole grains, vegetables, and fruits in your diet. Eating these foods may help lower blood pressure.  Maintaining a healthy weight.  Staying physically active and exercising regularly. SYMPTOMS  The symptoms of abdominal aortic aneurysm may vary depending on the size and rate of growth of the aneurysm.Most grow slowly and do not have any symptoms. When symptoms do occur, they may include:  Pain (abdomen, side, lower back, or groin). The pain may vary in intensity. A sudden onset of severe pain may indicate that the aneurysm has ruptured.  Feeling full after eating only small amounts of food.  Nausea or vomiting or both.  Feeling a pulsating lump in the abdomen.  Feeling faint or passing out. DIAGNOSIS  Since most unruptured abdominal aortic aneurysms have no symptoms, they are often discovered during diagnostic exams for other conditions. An aneurysm may be found during the following procedures:  Ultrasonography (A one-time screening for abdominal aortic aneurysm by ultrasonography is also recommended for all men aged 65-75 years who have ever smoked).  X-ray exams.  A computed tomography (CT).  Magnetic resonance imaging (MRI).  Angiography or arteriography. TREATMENT  Treatment of an abdominal aortic aneurysm depends on the size of your aneurysm, your age, and risk factors for rupture. Medication to control blood pressure and pain may be used to manage aneurysms smaller than 6 cm. Regular monitoring for enlargement may be recommended by your caregiver if:  The aneurysm is 3-4 cm in size (an annual ultrasonography may be recommended).  The aneurysm is 4-4.5 cm in size (an ultrasonography every 6 months may be recommended).  The aneurysm is larger than 4.5 cm in   size (your caregiver may ask that you be examined by a vascular surgeon). If your aneurysm is larger than 6 cm, surgical repair may be recommended. There are two main methods for repair of an aneurysm:   Endovascular  repair (a minimally invasive surgery). This is done most often.  Open repair. This method is used if an endovascular repair is not possible.   This information is not intended to replace advice given to you by your health care provider. Make sure you discuss any questions you have with your health care provider.   Document Released: 12/21/2004 Document Revised: 07/08/2012 Document Reviewed: 04/12/2012 Elsevier Interactive Patient Education 2016 [employer]  

## 2015-12-08 ENCOUNTER — Ambulatory Visit
Admission: RE | Admit: 2015-12-08 | Discharge: 2015-12-08 | Disposition: A | Discharge status: Home / Self Care | Payer: Medicare Other | Source: Ambulatory Visit | Attending: Family | Admitting: Family

## 2015-12-08 DIAGNOSIS — I700 Atherosclerosis of aorta: Secondary | ICD-10-CM | POA: Diagnosis not present

## 2015-12-08 DIAGNOSIS — I714 Abdominal aortic aneurysm, without rupture, unspecified: Secondary | ICD-10-CM

## 2015-12-08 MED ORDER — IOPAMIDOL (ISOVUE-370) INJECTION 76%
75.0000 mL | Freq: Once | INTRAVENOUS | Status: AC | PRN
  Administered 2015-12-08: 75 mL via INTRAVENOUS

## 2015-12-13 ENCOUNTER — Encounter: Payer: Self-pay | Admitting: Vascular Surgery

## 2015-12-17 ENCOUNTER — Ambulatory Visit (INDEPENDENT_AMBULATORY_CARE_PROVIDER_SITE_OTHER): Payer: Medicare Other | Admitting: Vascular Surgery

## 2015-12-17 ENCOUNTER — Encounter: Payer: Self-pay | Admitting: Vascular Surgery

## 2015-12-17 VITALS — BP 121/82 | HR 82 | Temp 97.2°F | Resp 18 | Ht 70.0 in | Wt 190.0 lb

## 2015-12-17 DIAGNOSIS — I714 Abdominal aortic aneurysm, without rupture, unspecified: Secondary | ICD-10-CM

## 2015-12-17 DIAGNOSIS — I2510 Atherosclerotic heart disease of native coronary artery without angina pectoris: Secondary | ICD-10-CM | POA: Diagnosis not present

## 2015-12-17 NOTE — Progress Notes (Signed)
[name], male   DOB: 04/20/1930, 82 y.o.   MRN: [id number]  Reason for Consult: AAA (had CTA scan )   Referred by Lajean Manes, MD  Subjective:     HPI:  [name] is a 82 y.o. male with history of rheumatoid arthritis on chronic steroid immunosuppression also has pulmonary fibrosis and was a previous smoker. He is followed for aortic aneurysm. He does have chronic back pain although not attributable to his aneurysm. He also has no family history of aneurysmal disease. He does take aspirin and statin daily. His aneurysm was noted to be 4.6 cm in February of this year and now on CT scan noted to be greater than 5. He remains active and able to walk without limitation from his lower extremities. He does not have any recent weight loss or other changes.  Past Medical History:  Diagnosis Date  . AAA (abdominal aortic aneurysm) ([place])   . Arthritis    ra  . Atrial fibrillation ([place])   . COPD (chronic obstructive pulmonary disease) ([place]) Jan.2016   Small amount of Emphysema- Pulmonary Fibrosis ( Feb. 9, 2016 )  . Coronary atherosclerosis   . Hematuria, gross    HISTORY OF  ?  2004  . Hypertension    dr Daneen Schick  . Immune disorder ([place])   . Myocardial infarction ([place]) 2000  . Pulmonary fibrosis ([place])   . Rheumatoid arthritis ([place])   . Shortness of breath   . Staph skin infection    Back   Family History  Problem Relation Age of Onset  . Heart disease Brother     Heart Disease before age 5  . Stroke Brother   . Heart attack Brother   . Hypertension Brother   . Varicose Veins Sister   . Diabetes Son   . Heart disease Son     Heart Disease before age 2  . Hypertension Son   . Heart attack Son   . COPD Sister   . Bleeding Disorder Father   . COPD Father     Emphysema   Past Surgical History:  Procedure Laterality Date  . BACK SURGERY    . ESOPHAGOGASTRODUODENOSCOPY  2004    ?  Marland Kitchen EYE SURGERY Left 03-14-13   [place]  . HAND SURGERY     rel to staph  . JOINT REPLACEMENT     bil hips  . JOINT REPLACEMENT Right 01-20-13   Knee  . stents  2000  . TOTAL KNEE ARTHROPLASTY Right 01/20/2013   Procedure: RIGHT TOTAL KNEE ARTHROPLASTY;  Surgeon: Kerin Salen, MD;  Location: [facility];  Service: Orthopedics;  Laterality: Right;    Short Social History:  Social History  Substance Use Topics  . Smoking status: Former Smoker    Packs/day: 3.00    Years: 50.00    Types: Cigarettes    Quit date: 03/27/1978  . Smokeless tobacco: Former Systems developer    Types: Chew  . Alcohol use No    Allergies  Allergen Reactions  . Morphine And Related Nausea And Vomiting and Other (See Comments)    Hallucinations   . Morphine Other (See Comments)    "makes me crazy"    Current Outpatient Prescriptions  Medication Sig Dispense Refill  . aspirin 81 MG tablet Take 81 mg by mouth daily.    Marland Kitchen atorvastatin (LIPITOR) 40 MG tablet Take 40 mg by mouth daily.    . calcium carbonate (OS-CAL) 600 MG TABS Take  600 mg by mouth daily.     . diclofenac sodium (VOLTAREN) 1 % GEL Apply 1 application topically 2 (two) times daily as needed (joint/muscle pain).     . hydroxychloroquine (PLAQUENIL) 200 MG tablet Take 200 mg by mouth 2 (two) times daily.     . meloxicam (MOBIC) 15 MG tablet Take 15 mg by mouth daily as needed for pain.    . metoprolol tartrate (LOPRESSOR) 25 MG tablet Take 25 mg by mouth 2 (two) times daily.    . Multiple Vitamin (MULTIVITAMIN) capsule Take 1 capsule by mouth daily.      . Multiple Vitamins-Minerals (OCUVITE PRESERVISION PO) Take 1 tablet by mouth daily.    . nitroGLYCERIN (NITROSTAT) 0.4 MG SL tablet Place 1 tablet (0.4 mg total) under the tongue every 5 (five) minutes as needed for chest pain. 25 tablet 3  . omeprazole (PRILOSEC) 20 MG capsule Take 20 mg by mouth daily.      . predniSONE (DELTASONE) 5 MG tablet Take 5 mg by mouth daily.      Marland Kitchen Propylene Glycol (SYSTANE BALANCE OP) Place 1 drop into both eyes 2 (two) times daily.      Marland Kitchen sulfaSALAzine (AZULFIDINE) 500 MG tablet Take 500 mg by mouth 4 (four) times daily.    . Tamsulosin HCl (FLOMAX) 0.4 MG CAPS Take 0.4 mg by mouth daily.      No current facility-administered medications for this visit.     Review of Systems  Constitutional:  Constitutional negative. Eyes: Eyes negative.  Respiratory: Positive for shortness of breath.  Cardiovascular: Cardiovascular negative.  GI: Gastrointestinal negative.  GU: Genitourinary negative. Musculoskeletal: Musculoskeletal negative.  Skin: Skin negative.  Neurological: Neurological negative. Hematologic: Hematologic/lymphatic negative.  Psychiatric: Psychiatric negative.        Objective:  Objective   Vitals:   12/17/15 1117  BP: 121/82  Pulse: 82  Resp: 18  Temp: 97.2 F (36.2 C)  TempSrc: Oral  SpO2: 96%  Weight: 190 lb (86.2 kg)  Height: 5\' 10"  (1.778 m)   Body mass index is 27.26 kg/m.  Physical Exam  Constitutional: He is oriented to person, place, and time. He appears well-developed.  Eyes: EOM are normal.  Neck: Neck supple.  Cardiovascular: Normal rate, regular rhythm and normal heart sounds.   No murmur heard. Pulmonary/Chest: Effort normal and breath sounds normal.  Abdominal: Soft. He exhibits mass.  Musculoskeletal: Normal range of motion. He exhibits no edema.  Neurological: He is alert and oriented to person, place, and time.  Skin: Skin is warm and dry.  Psychiatric: He has a normal mood and affect. His behavior is normal. Judgment and thought content normal.    Data: IMPRESSION: VASCULAR  1. Significant increase in size of infrarenal abdominal aortic aneurysm, now measuring 5.1 cm in diameter, previously, 3.6 cm. No evidence of dissection or impending rupture. Aortic aneurysm NOS (ICD10-I71.9) 2.  Aortic Atherosclerosis (ICD10-170.0) NON-VASCULAR  1. Interstitial thickening and honeycombing within the imaged bilateral lung bases likely minimally progressed compared to  the 03/2014 examination, worrisome for progression of pulmonary fibrosis / UIP. 2. Cholelithiasis without evidence of cholecystitis. 3. Nonobstructing bilateral nephrolithiasis. 4. Colonic diverticulosis without evidence of diverticulitis. 5. Moderate sized hiatal hernia.     Assessment/Plan:  A 68-year-old white male with now 5.1 cm abdominal aortic aneurysm that is easily palpable on physical exam. This has grown from 4.6 cm just 7 months ago. We described the natural course of aortic aneurysm disease in that repair is usually recommended  for patients with greater than 5.5 cm aneurysms or those with significant growth which his meets the criteria for. I reviewed his CT scan findings and he is a suitable candidate for endovascular repair which his family and he are interested in. We discussed the risk benefits and alternatives. He will need to have cardiology and pulmonary see him that we will tentatively plan to fix him on October 12.     Waynetta Sandy MD [facility]

## 2015-12-20 ENCOUNTER — Other Ambulatory Visit: Payer: Self-pay

## 2015-12-21 ENCOUNTER — Telehealth: Payer: Self-pay

## 2015-12-21 DIAGNOSIS — I2510 Atherosclerotic heart disease of native coronary artery without angina pectoris: Secondary | ICD-10-CM

## 2015-12-21 DIAGNOSIS — Z01810 Encounter for preprocedural cardiovascular examination: Secondary | ICD-10-CM

## 2015-12-21 NOTE — Telephone Encounter (Signed)
Cardiac clearance request received from [facility]. Patient is scheduled for a Endovascular Aneurysm Repair with Dr.Cain on 01/06/16.  Clearance should be faxed to [phone]  Request forwarded to Dr.Smith to advise

## 2015-12-21 NOTE — Telephone Encounter (Signed)
Determine if he is having any cardiac symptoms. Whether he is or not, I believe he needs to have a Lexiscan myocardial perfusion study to be absolutely certain that no underlying significant problems are present prior to the stress of vascular surgery. Clearance will be pending the results of the study which we will compare to the study done 2 years ago.

## 2015-12-21 NOTE — Telephone Encounter (Signed)
Called to adv pt that per Dr.Smith a Lexiscan (stress test) will be need before cariac clearance can be granted for his upcoming sx with [facility]. Order for Carlton Adam is in [place]. Pt should call back to schedule asap. Message also fed to scheduling to try again to contact pt.

## 2015-12-22 ENCOUNTER — Telehealth (HOSPITAL_COMMUNITY): Payer: Self-pay | Admitting: *Deleted

## 2015-12-22 NOTE — Telephone Encounter (Signed)
Patient given detailed instructions per Myocardial Perfusion Study Information Sheet for the test on 12/23/15. Patient notified to arrive 15 minutes early and that it is imperative to arrive on time for appointment to keep from having the test rescheduled.  If you need to cancel or reschedule your appointment, please call the office within 24 hours of your appointment. Failure to do so may result in a cancellation of your appointment, and a $50 no show fee. Patient verbalized understanding. Ivett Luebbe Jacqueline    

## 2015-12-23 ENCOUNTER — Encounter: Payer: Self-pay | Admitting: Vascular Surgery

## 2015-12-23 ENCOUNTER — Ambulatory Visit (HOSPITAL_COMMUNITY): Payer: Medicare Other | Attending: Cardiology

## 2015-12-23 DIAGNOSIS — I2510 Atherosclerotic heart disease of native coronary artery without angina pectoris: Secondary | ICD-10-CM | POA: Diagnosis not present

## 2015-12-23 DIAGNOSIS — Z01810 Encounter for preprocedural cardiovascular examination: Secondary | ICD-10-CM | POA: Diagnosis not present

## 2015-12-23 LAB — MYOCARDIAL PERFUSION IMAGING
CHL CUP NUCLEAR SRS: 6
CHL CUP NUCLEAR SSS: 9
CSEPPHR: 94 {beats}/min
LV sys vol: 61 mL
LVDIAVOL: 122 mL (ref 62–150)
RATE: 0.32
Rest HR: 72 {beats}/min
SDS: 3
TID: 1.12

## 2015-12-23 MED ORDER — TECHNETIUM TC 99M TETROFOSMIN IV KIT
10.5000 | PACK | Freq: Once | INTRAVENOUS | Status: AC | PRN
  Administered 2015-12-23: 11 via INTRAVENOUS
  Filled 2015-12-23: qty 11

## 2015-12-23 MED ORDER — TECHNETIUM TC 99M TETROFOSMIN IV KIT
30.5000 | PACK | Freq: Once | INTRAVENOUS | Status: AC | PRN
  Administered 2015-12-23: 31 via INTRAVENOUS
  Filled 2015-12-23: qty 31

## 2015-12-23 MED ORDER — REGADENOSON 0.4 MG/5ML IV SOLN
0.4000 mg | Freq: Once | INTRAVENOUS | Status: AC
  Administered 2015-12-23: 0.4 mg via INTRAVENOUS

## 2015-12-27 NOTE — Telephone Encounter (Signed)
Lexiscan completed and clearance sent to Dr. Claretha Cooper office.

## 2016-01-01 ENCOUNTER — Encounter (HOSPITAL_COMMUNITY): Payer: Self-pay

## 2016-01-01 NOTE — Pre-Procedure Instructions (Signed)
[name]  01/01/2016     Your procedure is scheduled on October 12.  Report to [facility] Admitting at 5:30 A.M.  Call this number if you have problems the morning of surgery:  [phone]   Remember:  Do not eat food or drink liquids after midnight.  Take these medicines the morning of surgery with A SIP OF WATER: Metoprolol, Omeprazole, Prednisone, Sulfasalazine, Flomax   STOP Multiple Vitamins, Occuvite, Calcium today   STOP/ Do not take Aspirin, Aleve, Naproxen, Advil, Ibuprofen, Motrin, Vitamins, Herbs, or Supplements starting today   Do not wear jewelry, make-up or nail polish.  Do not wear lotions, powders, or perfumes, or deoderant.  Do not shave 48 hours prior to surgery.  Men may shave face and neck.  Do not bring valuables to the hospital.  [facility] is not responsible for any belongings or valuables.  Contacts, dentures or bridgework may not be worn into surgery.  Leave your suitcase in the car.  After surgery it may be brought to your room.  For patients admitted to the hospital, discharge time will be determined by your treatment team.  Patients discharged the day of surgery will not be allowed to drive home.   [facility] - Preparing for Surgery  Before surgery, you can play an important role.  Because skin is not sterile, your skin needs to be as free of germs as possible.  You can reduce the number of germs on you skin by washing with CHG (chlorahexidine gluconate) soap before surgery.  CHG is an antiseptic cleaner which kills germs and bonds with the skin to continue killing germs even after washing.  Please DO NOT use if you have an allergy to CHG or antibacterial soaps.  If your skin becomes reddened/irritated stop using the CHG and inform your nurse when you arrive at Short Stay.  Do not shave (including legs and underarms) for at least 48 hours prior to the first CHG shower.  You may shave your face.  Please follow these instructions  carefully:   1.  Shower with CHG Soap the night before surgery and the morning of Surgery.  2.  If you choose to wash your hair, wash your hair first as usual with your normal shampoo.  3.  After you shampoo, rinse your hair and body thoroughly to remove the shampoo.  4.  Use CHG as you would any other liquid soap.  You can apply CHG directly to the skin and wash gently with scrungie or a clean washcloth.  5.  Apply the CHG Soap to your body ONLY FROM THE NECK DOWN.  Do not use on open wounds or open sores.  Avoid contact with your eyes, ears, mouth and genitals (private parts).  Wash genitals (private parts) with your normal soap.  6.  Wash thoroughly, paying special attention to the area where your surgery will be performed.  7.  Thoroughly rinse your body with warm water from the neck down.  8.  DO NOT shower/wash with your normal soap after using and rinsing off the CHG Soap.  9.  Pat yourself dry with a clean towel.            10.  Wear clean pajamas.            11.  Place clean sheets on your bed the night of your first shower and do not sleep with pets.  Day of Surgery  Do not apply any lotions  the morning of surgery.  Please wear clean clothes to the hospital/surgery center.

## 2016-01-03 ENCOUNTER — Encounter (HOSPITAL_COMMUNITY): Payer: Self-pay

## 2016-01-03 ENCOUNTER — Encounter (HOSPITAL_COMMUNITY)
Admission: RE | Admit: 2016-01-03 | Discharge: 2016-01-03 | Disposition: A | Discharge status: Home / Self Care | Payer: Medicare Other | Source: Ambulatory Visit | Attending: Vascular Surgery | Admitting: Vascular Surgery

## 2016-01-03 ENCOUNTER — Other Ambulatory Visit (HOSPITAL_COMMUNITY): Payer: Self-pay | Admitting: *Deleted

## 2016-01-03 HISTORY — DX: Cardiac arrhythmia, unspecified: I49.9

## 2016-01-03 HISTORY — DX: Anemia, unspecified: D64.9

## 2016-01-03 LAB — PROTIME-INR
INR: 1.06
Prothrombin Time: 13.8 seconds (ref 11.4–15.2)

## 2016-01-03 LAB — CBC
HCT: 41.5 % (ref 39.0–52.0)
Hemoglobin: 13.5 g/dL (ref 13.0–17.0)
MCH: 32 pg (ref 26.0–34.0)
MCHC: 32.5 g/dL (ref 30.0–36.0)
MCV: 98.3 fL (ref 78.0–100.0)
PLATELETS: 287 10*3/uL (ref 150–400)
RBC: 4.22 MIL/uL (ref 4.22–5.81)
RDW: 13.6 % (ref 11.5–15.5)
WBC: 8.5 10*3/uL (ref 4.0–10.5)

## 2016-01-03 LAB — TYPE AND SCREEN
ABO/RH(D): O NEG
ANTIBODY SCREEN: NEGATIVE

## 2016-01-03 LAB — COMPREHENSIVE METABOLIC PANEL
ALBUMIN: 3.5 g/dL (ref 3.5–5.0)
ALT: 14 U/L — AB (ref 17–63)
AST: 24 U/L (ref 15–41)
Alkaline Phosphatase: 69 U/L (ref 38–126)
Anion gap: 11 (ref 5–15)
BUN: 14 mg/dL (ref 6–20)
CHLORIDE: 108 mmol/L (ref 101–111)
CO2: 20 mmol/L — AB (ref 22–32)
CREATININE: 0.71 mg/dL (ref 0.61–1.24)
Calcium: 9.2 mg/dL (ref 8.9–10.3)
GFR calc Af Amer: >60 mL/min (ref >60)
GFR calc non Af Amer: >60 mL/min (ref >60)
GLUCOSE: 100 mg/dL — AB (ref 65–99)
POTASSIUM: 4.1 mmol/L (ref 3.5–5.1)
Sodium: 139 mmol/L (ref 135–145)
Total Bilirubin: 0.6 mg/dL (ref 0.3–1.2)
Total Protein: 6 g/dL — ABNORMAL LOW (ref 6.5–8.1)

## 2016-01-03 LAB — URINALYSIS, ROUTINE W REFLEX MICROSCOPIC
BILIRUBIN URINE: NEGATIVE
Glucose, UA: NEGATIVE mg/dL
Hgb urine dipstick: NEGATIVE
Ketones, ur: NEGATIVE mg/dL
Leukocytes, UA: NEGATIVE
NITRITE: NEGATIVE
PH: 6.5 (ref 5.0–8.0)
Protein, ur: NEGATIVE mg/dL
SPECIFIC GRAVITY, URINE: 1.023 (ref 1.005–1.030)

## 2016-01-03 LAB — BLOOD GAS, ARTERIAL
ACID-BASE DEFICIT: 0.9 mmol/L (ref 0.0–2.0)
BICARBONATE: 22.8 mmol/L (ref 20.0–28.0)
DRAWN BY: 206361
FIO2: 0.21
O2 Saturation: 94.9 %
PATIENT TEMPERATURE: 98.6
PH ART: 7.429 (ref 7.350–7.450)
pCO2 arterial: 35.1 mmHg (ref 32.0–48.0)
pO2, Arterial: 76.2 mmHg — ABNORMAL LOW (ref 83.0–108.0)

## 2016-01-03 LAB — SURGICAL PCR SCREEN
MRSA, PCR: NEGATIVE
STAPHYLOCOCCUS AUREUS: NEGATIVE

## 2016-01-03 LAB — APTT: APTT: 30 s (ref 24–36)

## 2016-01-06 ENCOUNTER — Other Ambulatory Visit: Payer: Self-pay

## 2016-01-06 ENCOUNTER — Inpatient Hospital Stay (HOSPITAL_COMMUNITY): Payer: Medicare Other | Admitting: Anesthesiology

## 2016-01-06 ENCOUNTER — Encounter (HOSPITAL_COMMUNITY)
Admission: RE | Disposition: A | Discharge status: Home / Self Care | Payer: Self-pay | Source: Ambulatory Visit | Attending: Vascular Surgery

## 2016-01-06 ENCOUNTER — Inpatient Hospital Stay (HOSPITAL_COMMUNITY): Payer: Medicare Other

## 2016-01-06 ENCOUNTER — Encounter (HOSPITAL_COMMUNITY): Payer: Self-pay | Admitting: *Deleted

## 2016-01-06 ENCOUNTER — Inpatient Hospital Stay (HOSPITAL_COMMUNITY)
Admission: RE | Admit: 2016-01-06 | Discharge: 2016-01-07 | DRG: 269 | Disposition: A | Discharge status: Home / Self Care | Payer: Medicare Other | Source: Ambulatory Visit | Attending: Vascular Surgery | Admitting: Vascular Surgery

## 2016-01-06 DIAGNOSIS — K449 Diaphragmatic hernia without obstruction or gangrene: Secondary | ICD-10-CM | POA: Diagnosis present

## 2016-01-06 DIAGNOSIS — Z79899 Other long term (current) drug therapy: Secondary | ICD-10-CM

## 2016-01-06 DIAGNOSIS — J8410 Pulmonary fibrosis, unspecified: Secondary | ICD-10-CM | POA: Diagnosis present

## 2016-01-06 DIAGNOSIS — J449 Chronic obstructive pulmonary disease, unspecified: Secondary | ICD-10-CM | POA: Diagnosis not present

## 2016-01-06 DIAGNOSIS — Z96643 Presence of artificial hip joint, bilateral: Secondary | ICD-10-CM | POA: Diagnosis present

## 2016-01-06 DIAGNOSIS — Z48812 Encounter for surgical aftercare following surgery on the circulatory system: Secondary | ICD-10-CM

## 2016-01-06 DIAGNOSIS — Z885 Allergy status to narcotic agent status: Secondary | ICD-10-CM

## 2016-01-06 DIAGNOSIS — I10 Essential (primary) hypertension: Secondary | ICD-10-CM | POA: Diagnosis present

## 2016-01-06 DIAGNOSIS — I2510 Atherosclerotic heart disease of native coronary artery without angina pectoris: Secondary | ICD-10-CM | POA: Diagnosis present

## 2016-01-06 DIAGNOSIS — Z96651 Presence of right artificial knee joint: Secondary | ICD-10-CM | POA: Diagnosis present

## 2016-01-06 DIAGNOSIS — Z95828 Presence of other vascular implants and grafts: Secondary | ICD-10-CM

## 2016-01-06 DIAGNOSIS — Z9842 Cataract extraction status, left eye: Secondary | ICD-10-CM | POA: Diagnosis not present

## 2016-01-06 DIAGNOSIS — Z7952 Long term (current) use of systemic steroids: Secondary | ICD-10-CM

## 2016-01-06 DIAGNOSIS — I714 Abdominal aortic aneurysm, without rupture, unspecified: Secondary | ICD-10-CM | POA: Diagnosis present

## 2016-01-06 DIAGNOSIS — Z7982 Long term (current) use of aspirin: Secondary | ICD-10-CM

## 2016-01-06 DIAGNOSIS — Z87891 Personal history of nicotine dependence: Secondary | ICD-10-CM

## 2016-01-06 DIAGNOSIS — Z8679 Personal history of other diseases of the circulatory system: Secondary | ICD-10-CM

## 2016-01-06 DIAGNOSIS — I252 Old myocardial infarction: Secondary | ICD-10-CM | POA: Diagnosis not present

## 2016-01-06 DIAGNOSIS — I4891 Unspecified atrial fibrillation: Secondary | ICD-10-CM | POA: Diagnosis not present

## 2016-01-06 DIAGNOSIS — N200 Calculus of kidney: Secondary | ICD-10-CM | POA: Diagnosis present

## 2016-01-06 DIAGNOSIS — R0602 Shortness of breath: Secondary | ICD-10-CM | POA: Diagnosis not present

## 2016-01-06 DIAGNOSIS — M1990 Unspecified osteoarthritis, unspecified site: Secondary | ICD-10-CM | POA: Diagnosis not present

## 2016-01-06 DIAGNOSIS — Z9889 Other specified postprocedural states: Secondary | ICD-10-CM

## 2016-01-06 HISTORY — PX: ABDOMINAL AORTIC ENDOVASCULAR STENT GRAFT: SHX5707

## 2016-01-06 LAB — PROTIME-INR
INR: 1.21
PROTHROMBIN TIME: 15.3 s — AB (ref 11.4–15.2)

## 2016-01-06 LAB — CBC
HCT: 35 % — ABNORMAL LOW (ref 39.0–52.0)
Hemoglobin: 11.5 g/dL — ABNORMAL LOW (ref 13.0–17.0)
MCH: 31.6 pg (ref 26.0–34.0)
MCHC: 32.9 g/dL (ref 30.0–36.0)
MCV: 96.2 fL (ref 78.0–100.0)
PLATELETS: 215 10*3/uL (ref 150–400)
RBC: 3.64 MIL/uL — ABNORMAL LOW (ref 4.22–5.81)
RDW: 13.7 % (ref 11.5–15.5)
WBC: 7.3 10*3/uL (ref 4.0–10.5)

## 2016-01-06 LAB — BASIC METABOLIC PANEL
Anion gap: 4 — ABNORMAL LOW (ref 5–15)
BUN: 9 mg/dL (ref 6–20)
CALCIUM: 8.5 mg/dL — AB (ref 8.9–10.3)
CO2: 26 mmol/L (ref 22–32)
Chloride: 110 mmol/L (ref 101–111)
Creatinine, Ser: 0.65 mg/dL (ref 0.61–1.24)
GFR calc Af Amer: >60 mL/min (ref >60)
GLUCOSE: 90 mg/dL (ref 65–99)
Potassium: 4.2 mmol/L (ref 3.5–5.1)
Sodium: 140 mmol/L (ref 135–145)

## 2016-01-06 LAB — MAGNESIUM: Magnesium: 1.8 mg/dL (ref 1.7–2.4)

## 2016-01-06 LAB — APTT: APTT: 33 s (ref 24–36)

## 2016-01-06 SURGERY — INSERTION, ENDOVASCULAR STENT GRAFT, AORTA, ABDOMINAL
Anesthesia: General

## 2016-01-06 MED ORDER — ONDANSETRON HCL 4 MG/2ML IJ SOLN: 4.0000 mg | Freq: Once | INTRAMUSCULAR | Status: DC | PRN

## 2016-01-06 MED ORDER — MAGNESIUM SULFATE 2 GM/50ML IV SOLN: 2.0000 g | Freq: Every day | INTRAVENOUS | Status: DC | PRN

## 2016-01-06 MED ORDER — SUGAMMADEX SODIUM 500 MG/5ML IV SOLN
INTRAVENOUS | Status: DC | PRN
  Administered 2016-01-06: 170 mg via INTRAVENOUS

## 2016-01-06 MED ORDER — FENTANYL CITRATE (PF) 250 MCG/5ML IJ SOLN
INTRAMUSCULAR | Status: AC
  Filled 2016-01-06: qty 5

## 2016-01-06 MED ORDER — ONDANSETRON HCL 4 MG/2ML IJ SOLN
4.0000 mg | Freq: Four times a day (QID) | INTRAMUSCULAR | Status: DC | PRN
  Administered 2016-01-06: 4 mg via INTRAVENOUS
  Filled 2016-01-06: qty 2

## 2016-01-06 MED ORDER — HEPARIN SODIUM (PORCINE) 1000 UNIT/ML IJ SOLN
INTRAMUSCULAR | Status: AC
  Filled 2016-01-06: qty 1

## 2016-01-06 MED ORDER — GLYCOPYRROLATE 0.2 MG/ML IJ SOLN
INTRAMUSCULAR | Status: DC | PRN
Start: 2016-01-06 — End: 2016-01-06
  Administered 2016-01-06: .2 mg via INTRAVENOUS
  Administered 2016-01-06: .3 mg via INTRAVENOUS

## 2016-01-06 MED ORDER — HYDROMORPHONE HCL 1 MG/ML IJ SOLN
0.5000 mg | INTRAMUSCULAR | Status: DC | PRN
  Administered 2016-01-06: 1 mg via INTRAVENOUS
  Filled 2016-01-06: qty 1

## 2016-01-06 MED ORDER — LIDOCAINE HCL (CARDIAC) 20 MG/ML IV SOLN
INTRAVENOUS | Status: DC | PRN
  Administered 2016-01-06: 100 mg via INTRATRACHEAL

## 2016-01-06 MED ORDER — PHENYLEPHRINE HCL 10 MG/ML IJ SOLN
INTRAVENOUS | Status: DC | PRN
  Administered 2016-01-06: 25 ug/min via INTRAVENOUS

## 2016-01-06 MED ORDER — ACETAMINOPHEN 325 MG RE SUPP: 325.0000 mg | RECTAL | Status: DC | PRN

## 2016-01-06 MED ORDER — DOCUSATE SODIUM 100 MG PO CAPS
100.0000 mg | ORAL_CAPSULE | Freq: Every day | ORAL | Status: DC
  Administered 2016-01-07: 100 mg via ORAL
  Filled 2016-01-06: qty 1

## 2016-01-06 MED ORDER — PANTOPRAZOLE SODIUM 40 MG PO TBEC: 40.0000 mg | DELAYED_RELEASE_TABLET | Freq: Every day | ORAL | Status: DC

## 2016-01-06 MED ORDER — GUAIFENESIN-DM 100-10 MG/5ML PO SYRP: 15.0000 mL | ORAL_SOLUTION | ORAL | Status: DC | PRN

## 2016-01-06 MED ORDER — EPHEDRINE SULFATE 50 MG/ML IJ SOLN
INTRAMUSCULAR | Status: DC | PRN
  Administered 2016-01-06 (×2): 5 mg via INTRAVENOUS

## 2016-01-06 MED ORDER — ACETAMINOPHEN 325 MG PO TABS: 325.0000 mg | ORAL_TABLET | ORAL | Status: DC | PRN

## 2016-01-06 MED ORDER — ONDANSETRON HCL 4 MG/2ML IJ SOLN
INTRAMUSCULAR | Status: DC | PRN
  Administered 2016-01-06: 4 mg via INTRAVENOUS

## 2016-01-06 MED ORDER — CHLORHEXIDINE GLUCONATE CLOTH 2 % EX PADS: 6.0000 | MEDICATED_PAD | Freq: Once | CUTANEOUS | Status: DC

## 2016-01-06 MED ORDER — GLYCOPYRROLATE 0.2 MG/ML IV SOSY
PREFILLED_SYRINGE | INTRAVENOUS | Status: AC
  Filled 2016-01-06: qty 3

## 2016-01-06 MED ORDER — ROCURONIUM BROMIDE 10 MG/ML (PF) SYRINGE
PREFILLED_SYRINGE | INTRAVENOUS | Status: AC
  Filled 2016-01-06: qty 10

## 2016-01-06 MED ORDER — LACTATED RINGERS IV SOLN
INTRAVENOUS | Status: DC | PRN
  Administered 2016-01-06 (×2): via INTRAVENOUS

## 2016-01-06 MED ORDER — HEPARIN SODIUM (PORCINE) 1000 UNIT/ML IJ SOLN
INTRAMUSCULAR | Status: DC | PRN
  Administered 2016-01-06: 8000 [IU] via INTRAVENOUS

## 2016-01-06 MED ORDER — LIDOCAINE 2% (20 MG/ML) 5 ML SYRINGE
INTRAMUSCULAR | Status: AC
  Filled 2016-01-06: qty 5

## 2016-01-06 MED ORDER — LABETALOL HCL 5 MG/ML IV SOLN: 10.0000 mg | INTRAVENOUS | Status: DC | PRN

## 2016-01-06 MED ORDER — SODIUM CHLORIDE 0.9 % IV SOLN
INTRAVENOUS | Status: DC
Start: 2016-01-06 — End: 2016-01-07
  Administered 2016-01-06: 11:00:00 via INTRAVENOUS

## 2016-01-06 MED ORDER — IODIXANOL 320 MG/ML IV SOLN
INTRAVENOUS | Status: DC | PRN
  Administered 2016-01-06: 60 mL via INTRA_ARTERIAL

## 2016-01-06 MED ORDER — NITROGLYCERIN 0.4 MG SL SUBL: 0.4000 mg | SUBLINGUAL_TABLET | SUBLINGUAL | Status: DC | PRN

## 2016-01-06 MED ORDER — ATORVASTATIN CALCIUM 40 MG PO TABS
40.0000 mg | ORAL_TABLET | Freq: Every day | ORAL | Status: DC
Start: 2016-01-06 — End: 2016-01-07
  Administered 2016-01-06 – 2016-01-07 (×2): 40 mg via ORAL
  Filled 2016-01-06 (×2): qty 1

## 2016-01-06 MED ORDER — ONDANSETRON HCL 4 MG/2ML IJ SOLN
INTRAMUSCULAR | Status: AC
  Filled 2016-01-06: qty 2

## 2016-01-06 MED ORDER — HYDROXYCHLOROQUINE SULFATE 200 MG PO TABS
200.0000 mg | ORAL_TABLET | Freq: Two times a day (BID) | ORAL | Status: DC
  Administered 2016-01-06 – 2016-01-07 (×3): 200 mg via ORAL
  Filled 2016-01-06 (×3): qty 1

## 2016-01-06 MED ORDER — MEPERIDINE HCL 25 MG/ML IJ SOLN: 6.2500 mg | INTRAMUSCULAR | Status: DC | PRN

## 2016-01-06 MED ORDER — SODIUM CHLORIDE 0.9 % IV SOLN
INTRAVENOUS | Status: DC | PRN
  Administered 2016-01-06: 500 mL

## 2016-01-06 MED ORDER — ROCURONIUM BROMIDE 100 MG/10ML IV SOLN
INTRAVENOUS | Status: DC | PRN
  Administered 2016-01-06: 50 mg via INTRAVENOUS

## 2016-01-06 MED ORDER — METOPROLOL TARTRATE 25 MG PO TABS
25.0000 mg | ORAL_TABLET | Freq: Two times a day (BID) | ORAL | Status: DC
  Administered 2016-01-06 – 2016-01-07 (×3): 25 mg via ORAL
  Filled 2016-01-06 (×3): qty 1

## 2016-01-06 MED ORDER — HYDROMORPHONE HCL 1 MG/ML IJ SOLN
INTRAMUSCULAR | Status: AC
  Filled 2016-01-06: qty 1

## 2016-01-06 MED ORDER — PROPOFOL 10 MG/ML IV BOLUS
INTRAVENOUS | Status: DC | PRN
  Administered 2016-01-06: 130 mg via INTRAVENOUS

## 2016-01-06 MED ORDER — SODIUM CHLORIDE 0.9 % IV SOLN: 500.0000 mL | Freq: Once | INTRAVENOUS | Status: DC | PRN

## 2016-01-06 MED ORDER — TAMSULOSIN HCL 0.4 MG PO CAPS
0.4000 mg | ORAL_CAPSULE | Freq: Every day | ORAL | Status: DC
  Administered 2016-01-06 – 2016-01-07 (×2): 0.4 mg via ORAL
  Filled 2016-01-06 (×2): qty 1

## 2016-01-06 MED ORDER — ENOXAPARIN SODIUM 40 MG/0.4ML ~~LOC~~ SOLN: 40.0000 mg | SUBCUTANEOUS | Status: DC

## 2016-01-06 MED ORDER — 0.9 % SODIUM CHLORIDE (POUR BTL) OPTIME
TOPICAL | Status: DC | PRN
  Administered 2016-01-06: 1000 mL

## 2016-01-06 MED ORDER — TRAMADOL HCL 50 MG PO TABS
50.0000 mg | ORAL_TABLET | Freq: Four times a day (QID) | ORAL | Status: DC | PRN
  Administered 2016-01-06: 50 mg via ORAL
  Filled 2016-01-06: qty 1

## 2016-01-06 MED ORDER — CALCIUM CARBONATE 1250 (500 CA) MG PO TABS
625.0000 mg | ORAL_TABLET | Freq: Every day | ORAL | Status: DC
  Administered 2016-01-06 – 2016-01-07 (×2): 625 mg via ORAL
  Filled 2016-01-06 (×2): qty 1

## 2016-01-06 MED ORDER — PROTAMINE SULFATE 10 MG/ML IV SOLN
INTRAVENOUS | Status: DC | PRN
  Administered 2016-01-06: 25 mg via INTRAVENOUS

## 2016-01-06 MED ORDER — HYDROMORPHONE HCL 1 MG/ML IJ SOLN
0.2500 mg | INTRAMUSCULAR | Status: DC | PRN
  Administered 2016-01-06 (×2): 0.5 mg via INTRAVENOUS

## 2016-01-06 MED ORDER — CEFUROXIME SODIUM 1.5 G IJ SOLR
1.5000 g | INTRAMUSCULAR | Status: AC
  Administered 2016-01-06: 1.5 g via INTRAVENOUS
  Filled 2016-01-06: qty 1.5

## 2016-01-06 MED ORDER — SULFASALAZINE 500 MG PO TABS
1000.0000 mg | ORAL_TABLET | Freq: Two times a day (BID) | ORAL | Status: DC
  Administered 2016-01-06 – 2016-01-07 (×2): 1000 mg via ORAL
  Filled 2016-01-06 (×2): qty 2

## 2016-01-06 MED ORDER — PREDNISONE 10 MG PO TABS
5.0000 mg | ORAL_TABLET | Freq: Every day | ORAL | Status: DC
Start: 2016-01-06 — End: 2016-01-07
  Administered 2016-01-06 – 2016-01-07 (×2): 5 mg via ORAL
  Filled 2016-01-06 (×2): qty 1

## 2016-01-06 MED ORDER — CEFUROXIME SODIUM 1.5 G IJ SOLR
1.5000 g | Freq: Two times a day (BID) | INTRAMUSCULAR | Status: AC
  Administered 2016-01-06 – 2016-01-07 (×2): 1.5 g via INTRAVENOUS
  Filled 2016-01-06 (×2): qty 1.5

## 2016-01-06 MED ORDER — PROPOFOL 10 MG/ML IV BOLUS
INTRAVENOUS | Status: AC
  Filled 2016-01-06: qty 20

## 2016-01-06 MED ORDER — ADULT MULTIVITAMIN W/MINERALS CH
1.0000 | ORAL_TABLET | Freq: Every day | ORAL | Status: DC
  Administered 2016-01-06 – 2016-01-07 (×2): 1 via ORAL
  Filled 2016-01-06 (×2): qty 1

## 2016-01-06 MED ORDER — FENTANYL CITRATE (PF) 250 MCG/5ML IJ SOLN
INTRAMUSCULAR | Status: DC | PRN
  Administered 2016-01-06: 250 ug via INTRAVENOUS

## 2016-01-06 MED ORDER — PANTOPRAZOLE SODIUM 40 MG PO TBEC
40.0000 mg | DELAYED_RELEASE_TABLET | Freq: Every day | ORAL | Status: DC
  Administered 2016-01-06 – 2016-01-07 (×2): 40 mg via ORAL
  Filled 2016-01-06 (×2): qty 1

## 2016-01-06 MED ORDER — SODIUM CHLORIDE 0.9 % IV SOLN: INTRAVENOUS | Status: DC

## 2016-01-06 MED ORDER — PHENOL 1.4 % MT LIQD: 1.0000 | OROMUCOSAL | Status: DC | PRN

## 2016-01-06 MED ORDER — SUGAMMADEX SODIUM 200 MG/2ML IV SOLN
INTRAVENOUS | Status: AC
  Filled 2016-01-06: qty 2

## 2016-01-06 MED ORDER — ASPIRIN EC 81 MG PO TBEC
81.0000 mg | DELAYED_RELEASE_TABLET | Freq: Every day | ORAL | Status: DC
  Administered 2016-01-07: 81 mg via ORAL
  Filled 2016-01-06: qty 1

## 2016-01-06 MED ORDER — POTASSIUM CHLORIDE CRYS ER 20 MEQ PO TBCR: 20.0000 meq | EXTENDED_RELEASE_TABLET | Freq: Every day | ORAL | Status: DC | PRN

## 2016-01-06 MED ORDER — HYDRALAZINE HCL 20 MG/ML IJ SOLN: 5.0000 mg | INTRAMUSCULAR | Status: DC | PRN

## 2016-01-06 MED ORDER — ALUM & MAG HYDROXIDE-SIMETH 200-200-20 MG/5ML PO SUSP: 15.0000 mL | ORAL | Status: DC | PRN

## 2016-01-06 MED ORDER — METOPROLOL TARTRATE 5 MG/5ML IV SOLN: 2.0000 mg | INTRAVENOUS | Status: DC | PRN

## 2016-01-06 MED ORDER — PROTAMINE SULFATE 10 MG/ML IV SOLN
INTRAVENOUS | Status: AC
  Filled 2016-01-06: qty 5

## 2016-01-06 SURGICAL SUPPLY — 59 items
BLADE SURG CLIPPER 3M 9600 (MISCELLANEOUS) ×3 IMPLANT
CANISTER SUCTION 2500CC (MISCELLANEOUS) ×3 IMPLANT
CATH ANGIO 5F BER2 65CM (CATHETERS) ×6 IMPLANT
CATH BEACON 5.038 65CM KMP-01 (CATHETERS) ×3 IMPLANT
CATH OMNI FLUSH .035X70CM (CATHETERS) ×3 IMPLANT
COVER PROBE W GEL 5X96 (DRAPES) ×3 IMPLANT
DERMABOND ADVANCED (GAUZE/BANDAGES/DRESSINGS) ×4
DERMABOND ADVANCED .7 DNX12 (GAUZE/BANDAGES/DRESSINGS) ×2 IMPLANT
DEVICE CLOSURE PERCLS PRGLD 6F (VASCULAR PRODUCTS) ×4 IMPLANT
DRAPE ZERO GRAVITY STERILE (DRAPES) ×3 IMPLANT
DRSG TEGADERM 2-3/8X2-3/4 SM (GAUZE/BANDAGES/DRESSINGS) ×6 IMPLANT
DRYSEAL FLEXSHEATH 12FR 33CM (SHEATH) ×2
DRYSEAL FLEXSHEATH 16FR 33CM (SHEATH) ×2
ELECT REM PT RETURN 9FT ADLT (ELECTROSURGICAL) ×6
ELECTRODE REM PT RTRN 9FT ADLT (ELECTROSURGICAL) ×2 IMPLANT
EXCLUDER TNK 23X14.5MMX12CM (Endovascular Graft) ×1 IMPLANT
EXCLUDER TRUNK 23X14.5MMX12CM (Endovascular Graft) ×3 IMPLANT
GAUZE SPONGE 2X2 8PLY STRL LF (GAUZE/BANDAGES/DRESSINGS) ×2 IMPLANT
GLOVE BIO SURGEON STRL SZ7.5 (GLOVE) ×3 IMPLANT
GLOVE BIOGEL PI IND STRL 6.5 (GLOVE) ×4 IMPLANT
GLOVE BIOGEL PI INDICATOR 6.5 (GLOVE) ×8
GLOVE SURG SS PI 7.0 STRL IVOR (GLOVE) ×6 IMPLANT
GOWN STRL REUS W/ TWL LRG LVL3 (GOWN DISPOSABLE) ×2 IMPLANT
GOWN STRL REUS W/ TWL XL LVL3 (GOWN DISPOSABLE) ×2 IMPLANT
GOWN STRL REUS W/TWL LRG LVL3 (GOWN DISPOSABLE) ×4
GOWN STRL REUS W/TWL XL LVL3 (GOWN DISPOSABLE) ×4
GRAFT BALLN CATH 65CM (STENTS) ×1 IMPLANT
GUIDEWIRE ANGLED .035X150CM (WIRE) ×3 IMPLANT
KIT BASIN OR (CUSTOM PROCEDURE TRAY) ×3 IMPLANT
KIT ROOM TURNOVER OR (KITS) ×3 IMPLANT
LEG CONTRALATERAL 16X16X9.5 (Endovascular Graft) ×3 IMPLANT
LEG CONTRALATERAL 16X18X13.5 (Endovascular Graft) ×3 IMPLANT
NEEDLE PERC 18GX7CM (NEEDLE) ×3 IMPLANT
NS IRRIG 1000ML POUR BTL (IV SOLUTION) ×3 IMPLANT
PACK ENDOVASCULAR (PACKS) ×3 IMPLANT
PAD ARMBOARD 7.5X6 YLW CONV (MISCELLANEOUS) ×6 IMPLANT
PERCLOSE PROGLIDE 6F (VASCULAR PRODUCTS) ×12
SHEATH AVANTI 11CM 8FR (MISCELLANEOUS) ×3 IMPLANT
SHEATH BRITE TIP 8FR 23CM (MISCELLANEOUS) IMPLANT
SHEATH DRYSEAL FLEX 12FR 33CM (SHEATH) ×1 IMPLANT
SHEATH DRYSEAL FLEX 16FR 33CM (SHEATH) ×1 IMPLANT
SHEATH INTROD 8 FR 25CM (MISCELLANEOUS) ×3 IMPLANT
SHIELD RADPAD SCOOP 12X17 (MISCELLANEOUS) IMPLANT
SPONGE GAUZE 2X2 STER 10/PKG (GAUZE/BANDAGES/DRESSINGS) ×4
STENT GRAFT BALLN CATH 65CM (STENTS) ×2
STOPCOCK MORSE 400PSI 3WAY (MISCELLANEOUS) ×3 IMPLANT
SUT ETHILON 3 0 PS 1 (SUTURE) IMPLANT
SUT MNCRL AB 4-0 PS2 18 (SUTURE) ×6 IMPLANT
SUT PROLENE 5 0 C 1 24 (SUTURE) IMPLANT
SUT VIC AB 2-0 CT1 27 (SUTURE)
SUT VIC AB 2-0 CT1 TAPERPNT 27 (SUTURE) IMPLANT
SUT VIC AB 3-0 SH 27 (SUTURE)
SUT VIC AB 3-0 SH 27X BRD (SUTURE) IMPLANT
SYR 30ML LL (SYRINGE) ×3 IMPLANT
SYR MEDRAD MARK V 150ML (SYRINGE) ×3 IMPLANT
TRAY FOLEY W/METER SILVER 16FR (SET/KITS/TRAYS/PACK) ×3 IMPLANT
TUBING HIGH PRESSURE 120CM (CONNECTOR) ×3 IMPLANT
WIRE AMPLATZ SS-J .035X180CM (WIRE) ×6 IMPLANT
WIRE BENTSON .035X145CM (WIRE) ×6 IMPLANT

## 2016-01-06 NOTE — Op Note (Signed)
Patient name: [name] MRN: [id number] DOB: May 14, 1930 Sex: male  01/06/2016 Pre-operative Diagnosis: AAA Post-operative diagnosis:  Same Surgeon:  Erlene Quan C. Donzetta Matters, MD Assistant: Alger Memos, [place] Procedure Performed: 1.  Catheter in aorta. 2.  Aortogram 3.  Modular bifurcated prosthesis with single docking limb 23 x 14 x 12 ipsilateral right 4.  distal extension of ipsilateral side with 16 x 9.5 and contralateral left with 18 x 13.5 5. US guided cannulation of bilateral common femoral arteries with percutaneous closure.   Indications:  82 year old white male with history of abdominal aortic aneurysm that has been noted to grow greater than 0.5 cm in the previous 6 months. He is now indicated for endovascular repair.  Findings: There was a suitable neck for planning of graft. There were suitable access vessels for placement of bilateral sheaths. At completion of graft placement there were no type I or 3 endoleak's with possible late type II. The aneurysm had been excluded. After sheath removal there were signals at the level of the feet DP and PT consistent with preoperative exam.   Procedure:  Patient was correctly identified taken to the operating room placed supine on the operating table general endotracheal anesthesia induced given antibiotics sterilely prepped and draped in usual fashion. Timeout was called we then began by identifying our bilateral common femoral vessels under ultrasound. We then cannulated both with 18-gauge needle placed a wire and deployed to provide devices on either side and plugged with 8 [place] sheath. Patient was then heparinized at this time. Catheter was used to asked if wires into the descending thoracic aorta and we exchanged for 16 French sheath on the right and 12 French sheath on the left. The main body right device was then brought to the level of L2 and a Omni catheter through the left side. Aortogram performed we identified our renal arteries.  Following this the device was deployed to the contralateral gate opening. We then cannulated our gate from the contralateral side using BER catheter and Glidewire. This was confirmed by twirling the Omni catheter within the graft. We then performed another aortogram identified our renals which appeared to be much higher than our graft. We therefore recaptured our graft, moved it a higher to the right renal artery and reopened it. Another aortogram demonstrated we now had adequate positioning. We performed retrograde angiogram from the left side and then deployed our left-sided extension 18 x 13.5 just to the level of the iliac bifurcation on the left. We then completed the deployment of our main body device. Retrograde angiogram from the right demonstrated our bifurcation and we then deployed our ipsilateral extension 16 x 9.5. We then ironed our graft with Q50 balloon throughout from both sides. Completion angiogram demonstrated the above findings with possible late type II endoleak. Satisfied we then removed our sheath on the left cinched down our provide devices of which one did snap and so pressure was held for a period of 15 minutes on the left side. On the right but devices cinched down nicely and there was no further bleeding. Patient was given 25 mg of protamine pressure held on the right for a period of 5 minutes both small incisions were closed with 4-0 Monocryl suture and Dermabond placed above that. Patient was then allowed awaken anesthesia having tolerated the procedure well was noted to be neurologically intact and had signals at his ankles at the [facility] and PT consistent with his preoperative exam.  Tameah Mihalko C. Donzetta Matters, MD [facility] [place] Office: [phone] Pager: [phone]

## 2016-01-06 NOTE — Anesthesia Preprocedure Evaluation (Signed)
Anesthesia Evaluation  Patient identified by MRN, date of birth, ID band Patient awake    Reviewed: Allergy & Precautions, NPO status , Patient's Chart, lab work & pertinent test results  Airway Mallampati: I  TM Distance: >3 FB Neck ROM: Full    Dental   Pulmonary COPD, former smoker,    Pulmonary exam normal        Cardiovascular hypertension, + CAD, + Past MI and + Cardiac Stents  Normal cardiovascular exam     Neuro/Psych    GI/Hepatic   Endo/Other    Renal/GU      Musculoskeletal   Abdominal   Peds  Hematology   Anesthesia Other Findings   Reproductive/Obstetrics                             Anesthesia Physical Anesthesia Plan  ASA: III  Anesthesia Plan: General   Post-op Pain Management:    Induction: Intravenous  Airway Management Planned: Oral ETT  Additional Equipment: Arterial line  Intra-op Plan:   Post-operative Plan: Extubation in OR  Informed Consent: I have reviewed the patients History and Physical, chart, labs and discussed the procedure including the risks, benefits and alternatives for the proposed anesthesia with the patient or authorized representative who has indicated his/her understanding and acceptance.     Plan Discussed with: CRNA and Surgeon  Anesthesia Plan Comments:         Anesthesia Quick Evaluation

## 2016-01-06 NOTE — Anesthesia Postprocedure Evaluation (Signed)
Anesthesia Post Note  Patient: [name]  Procedure(s) Performed: Procedure(s) (LRB): ABDOMINAL AORTIC ENDOVASCULAR STENT GRAFT (N/A)  Patient location during evaluation: PACU Anesthesia Type: General Level of consciousness: awake and alert Pain management: pain level controlled Vital Signs Assessment: post-procedure vital signs reviewed and stable Respiratory status: spontaneous breathing, nonlabored ventilation, respiratory function stable and patient connected to nasal cannula oxygen Cardiovascular status: blood pressure returned to baseline and stable Postop Assessment: no signs of nausea or vomiting Anesthetic complications: no    Last Vitals:  Vitals:   01/06/16 1125 01/06/16 1155  BP: 126/71 119/75  Pulse: 68 68  Resp: 18 16  Temp:      Last Pain:  Vitals:   01/06/16 1155  TempSrc:   PainSc: 0-No pain                 Melvie Paglia DAVID

## 2016-01-06 NOTE — Progress Notes (Signed)
  Day of Surgery Note    Subjective:  No complaints  Vitals:   01/06/16 1125 01/06/16 1155  BP: 126/71 119/75  Pulse: 68 68  Resp: 18 16  Temp:      Incisions:   Bilateral groins are soft without hematoma Extremities:  + palpable DP pulses bilaterally Cardiac:  regular Lungs:  Non labored Abdomen:  Soft, NT/ND  Assessment/Plan:  This is a 82 y.o. male who is s/p  Procedure Performed: 1.  Catheter in aorta. 2.  Aortogram 3.  Modular bifurcated prosthesis with single docking limb 23 x 14 x 12 ipsilateral right 4.  distal extension of ipsilateral side with 16 x 9.5 and contralateral left with 18 x 13.5 5. US guided cannulation of bilateral common femoral arteries with percutaneous closure.  -pt doing well in pacu with +palpable DP pulses bilaterally. -bilateral groins are soft without hematoma -to 3 south when bed available -anticipate d/c in 1-2 days.   Leontine Locket, PA-C 01/06/2016 12:21 PM [phone]

## 2016-01-06 NOTE — Anesthesia Procedure Notes (Signed)
Procedure Name: Intubation Date/Time: 01/06/2016 7:41 AM Performed by: Mariea Clonts Pre-anesthesia Checklist: Patient identified, Emergency Drugs available, Suction available and Patient being monitored Patient Re-evaluated:Patient Re-evaluated prior to inductionOxygen Delivery Method: Circle System Utilized Preoxygenation: Pre-oxygenation with 100% oxygen Intubation Type: IV induction Ventilation: Mask ventilation without difficulty Laryngoscope Size: Miller and 2 Grade View: Grade I Tube type: Oral Tube size: 7.5 mm Number of attempts: 1 Airway Equipment and Method: Stylet and Oral airway Placement Confirmation: ETT inserted through vocal cords under direct vision,  positive ETCO2 and breath sounds checked- equal and bilateral Tube secured with: Tape Dental Injury: Teeth and Oropharynx as per pre-operative assessment

## 2016-01-06 NOTE — H&P (Signed)
     History and Physical Update  The patient was interviewed and re-examined.  The patient's previous History and Physical has been reviewed and is unchanged from the office. Proceed with EVAR.  Jesse Nosbisch C. Donzetta Matters, MD [facility] [place] Office: [phone] Pager: [phone]   01/06/2016, 7:17 AM

## 2016-01-06 NOTE — Transfer of Care (Signed)
Immediate Anesthesia Transfer of Care Note  Patient: [name]  Procedure(s) Performed: Procedure(s): ABDOMINAL AORTIC ENDOVASCULAR STENT GRAFT (N/A)  Patient Location: PACU  Anesthesia Type:General  Level of Consciousness: awake, alert  and oriented  Airway & Oxygen Therapy: Patient Spontanous Breathing and Patient connected to nasal cannula oxygen  Post-op Assessment: Report given to RN, Post -op Vital signs reviewed and stable and Patient moving all extremities X 4  Post vital signs: Reviewed and stable  Last Vitals:  Vitals:   01/06/16 0627 01/06/16 0925  BP: 136/88 131/76  Pulse: 68 89  Resp: 18 20  Temp: 36.6 C 36.5 C    Last Pain:  Vitals:   01/06/16 0925  TempSrc:   PainSc: (P) 0-No pain      Patients Stated Pain Goal: 3 ([id number])  Complications: No apparent anesthesia complications

## 2016-01-07 ENCOUNTER — Telehealth: Payer: Self-pay | Admitting: Vascular Surgery

## 2016-01-07 ENCOUNTER — Encounter (HOSPITAL_COMMUNITY): Payer: Self-pay | Admitting: Vascular Surgery

## 2016-01-07 LAB — CBC
HEMATOCRIT: 34.7 % — AB (ref 39.0–52.0)
HEMOGLOBIN: 11.4 g/dL — AB (ref 13.0–17.0)
MCH: 31.7 pg (ref 26.0–34.0)
MCHC: 32.9 g/dL (ref 30.0–36.0)
MCV: 96.4 fL (ref 78.0–100.0)
Platelets: 120 10*3/uL — ABNORMAL LOW (ref 150–400)
RBC: 3.6 MIL/uL — AB (ref 4.22–5.81)
RDW: 13.8 % (ref 11.5–15.5)
WBC: 9.6 10*3/uL (ref 4.0–10.5)

## 2016-01-07 LAB — BASIC METABOLIC PANEL
ANION GAP: 7 (ref 5–15)
BUN: 8 mg/dL (ref 6–20)
CHLORIDE: 105 mmol/L (ref 101–111)
CO2: 23 mmol/L (ref 22–32)
Calcium: 8.8 mg/dL — ABNORMAL LOW (ref 8.9–10.3)
Creatinine, Ser: 0.72 mg/dL (ref 0.61–1.24)
GFR calc Af Amer: >60 mL/min (ref >60)
GFR calc non Af Amer: >60 mL/min (ref >60)
GLUCOSE: 89 mg/dL (ref 65–99)
POTASSIUM: 4 mmol/L (ref 3.5–5.1)
Sodium: 135 mmol/L (ref 135–145)

## 2016-01-07 MED ORDER — TRAMADOL HCL 50 MG PO TABS: 50.0000 mg | ORAL_TABLET | Freq: Four times a day (QID) | ORAL | 0 refills | Status: DC | PRN

## 2016-01-07 NOTE — Progress Notes (Addendum)
  [facility] Progress Note  Subjective  - POD #1  Complaining of some back pain which is chronic. Eating an oatmeal cookie.   Objective Vitals:   01/07/16 0500 01/07/16 0600  BP:    Pulse: 83 86  Resp: 19 20  Temp:      Intake/Output Summary (Last 24 hours) at 01/07/16 0726 Last data filed at 01/07/16 0630  Gross per 24 hour  Intake          2566.67 ml  Output             2035 ml  Net           531.67 ml   Groins without hematoma. Palpable DP pulses bilaterally.   Assessment/Planning: 82 y.o. male is s/p: EVAR 1 Day Post-Op   Doing well post-op.  Foley removed this am. Needs to void.  Tolerating a diet.  Has ambulated. Home after voiding.  F/u in 4 weeks with CTA.  Alvia Grove 01/07/2016 7:26 AM --  Laboratory CBC    Component Value Date/Time   WBC 9.6 01/07/2016 0500   HGB 11.4 (L) 01/07/2016 0500   HCT 34.7 (L) 01/07/2016 0500   PLT 120 (L) 01/07/2016 0500    BMET    Component Value Date/Time   NA 135 01/07/2016 0500   K 4.0 01/07/2016 0500   CL 105 01/07/2016 0500   CO2 23 01/07/2016 0500   GLUCOSE 89 01/07/2016 0500   BUN 8 01/07/2016 0500   CREATININE 0.72 01/07/2016 0500   CREATININE 0.75 11/23/2010 1208   CALCIUM 8.8 (L) 01/07/2016 0500   GFRNONAA >60 01/07/2016 0500   GFRNONAA >60 10/24/2010 1622   GFRAA >60 01/07/2016 0500   GFRAA >60 10/24/2010 1622    COAG Lab Results  Component Value Date   INR 1.21 01/06/2016   INR 1.06 01/03/2016   INR 1.06 01/13/2013   No results found for: PTT  Antibiotics Anti-infectives    Start     Dose/Rate Route Frequency Ordered Stop   01/06/16 1800  cefUROXime (ZINACEF) 1.5 g in dextrose 5 % 50 mL IVPB     1.5 g 100 mL/hr over 30 Minutes Intravenous Every 12 hours 01/06/16 1359 01/07/16 0645   01/06/16 1400  hydroxychloroquine (PLAQUENIL) tablet 200 mg     200 mg Oral 2 times daily 01/06/16 1359     01/06/16 0522  cefUROXime (ZINACEF) 1.5 g in dextrose 5 % 50 mL IVPB      1.5 g 100 mL/hr over 30 Minutes Intravenous 30 min pre-op 01/06/16 0522 01/06/16 0756       Virgina Jock, PA-C [facility] Office: [phone] Pager: [phone] 01/07/2016 7:26 AM  I have independently interviewed patient and agree with PA assessment and plan above.   Jalesha Plotz C. Donzetta Matters, MD [facility] [place] Office: [phone] Pager: [phone]

## 2016-01-07 NOTE — Telephone Encounter (Signed)
Spoke to spouse on home #, advised of appts but also req a letter, CT to be done at [facility] 11/20, F/U here 11/22

## 2016-01-07 NOTE — Telephone Encounter (Signed)
-----   Message from Denman George, RN sent at 01/07/2016 12:42 PM EDT ----- Regarding: note that pt. wants the CTA to be sched. at [facility]   ----- Message ----- From: Alvia Grove, PA-C Sent: 01/07/2016   7:25 AM To: Vvs Charge Pool  S/p EVAR 01/06/16  F/u in 4 weeks with Dr. Donzetta Matters. Can you arrange for the CTA to be at [facility]?   Thanks Maudie Mercury

## 2016-01-07 NOTE — Progress Notes (Signed)
Explained and discussed discharge instructions to patient and wife. Pt going home via w/c with belongings. Prescriptions and follow up appt.given to wife.

## 2016-01-07 NOTE — Telephone Encounter (Signed)
Unable to leave a vm phone just hung up Mailing letters for appt of CT on 11/19 and f/u 11/22

## 2016-01-07 NOTE — Telephone Encounter (Signed)
-----   Message from Denman George, RN sent at 01/06/2016  4:42 PM EDT ----- Regarding: needs 4 week f/u with CTA and appt. with Dr. Donzetta Matters   ----- Message ----- From: Gabriel Earing, PA-C Sent: 01/06/2016  12:23 PM To: Vvs Charge Pool  S/p EVAR 01/06/16.  F/u in 4 weeks with CTA protocol with Dr. Donzetta Matters.  Thanks, Aldona Bar

## 2016-01-07 NOTE — Care Management Note (Signed)
Case Management Note  Patient Details  Name: AAMIL MERCHANT MRN: [id number] Date of Birth: Jul 13, 1930  Subjective/Objective:  S/p AAA repair, pta indep, for dc today, no needs.                  Action/Plan:   Expected Discharge Date:                  Expected Discharge Plan:  Home/Self Care  In-House Referral:     Discharge planning Services  CM Consult  Post Acute Care Choice:    Choice offered to:     DME Arranged:    DME Agency:     HH Arranged:    HH Agency:     Status of Service:  Completed, signed off  If discussed at [employer] of Stay Meetings, dates discussed:    Additional Comments:  Zenon Mayo, RN 01/07/2016, 9:58 AM

## 2016-01-09 DIAGNOSIS — Z48812 Encounter for surgical aftercare following surgery on the circulatory system: Secondary | ICD-10-CM | POA: Diagnosis not present

## 2016-01-09 DIAGNOSIS — I2510 Atherosclerotic heart disease of native coronary artery without angina pectoris: Secondary | ICD-10-CM | POA: Diagnosis not present

## 2016-01-09 DIAGNOSIS — Z7982 Long term (current) use of aspirin: Secondary | ICD-10-CM | POA: Diagnosis not present

## 2016-01-09 DIAGNOSIS — Z87891 Personal history of nicotine dependence: Secondary | ICD-10-CM | POA: Diagnosis not present

## 2016-01-09 DIAGNOSIS — I10 Essential (primary) hypertension: Secondary | ICD-10-CM | POA: Diagnosis not present

## 2016-01-12 DIAGNOSIS — Z87891 Personal history of nicotine dependence: Secondary | ICD-10-CM | POA: Diagnosis not present

## 2016-01-12 DIAGNOSIS — Z7982 Long term (current) use of aspirin: Secondary | ICD-10-CM | POA: Diagnosis not present

## 2016-01-12 DIAGNOSIS — I2510 Atherosclerotic heart disease of native coronary artery without angina pectoris: Secondary | ICD-10-CM | POA: Diagnosis not present

## 2016-01-12 DIAGNOSIS — Z48812 Encounter for surgical aftercare following surgery on the circulatory system: Secondary | ICD-10-CM | POA: Diagnosis not present

## 2016-01-12 DIAGNOSIS — I10 Essential (primary) hypertension: Secondary | ICD-10-CM | POA: Diagnosis not present

## 2016-01-12 NOTE — Discharge Summary (Signed)
[facility] Discharge Summary  [name] 02-20-1931 82 y.o. male  [id number]  Admission Date: 01/06/2016  Discharge Date: 01/07/2016  Physician: Servando Snare, MD  Admission Diagnosis: Abdominal aortic aneurysm I71.4  HPI:   This is a 82 y.o. male with history of rheumatoid arthritis on chronic steroid immunosuppression also has pulmonary fibrosis and was a previous smoker. He is followed for aortic aneurysm. He does have chronic back pain although not attributable to his aneurysm. He also has no family history of aneurysmal disease. He does take aspirin and statin daily. His aneurysm was noted to be 4.6 cm in February of this year and now on CT scan noted to be greater than 5. He remains active and able to walk without limitation from his lower extremities. He does not have any recent weight loss or other changes.  Hospital Course:  The patient was admitted to the hospital and taken to the operating room on 01/06/2016 and underwent:  1.  Catheter in aorta. 2.  Aortogram 3.  Modular bifurcated prosthesis with single docking limb 23 x 14 x 12 ipsilateral right 4.  distal extension of ipsilateral side with 16 x 9.5 and contralateral left with 18 x 13.5 5. US guided cannulation of bilateral common femoral arteries with percutaneous closure.  The patient tolerated the procedure well and was transported to the PACU in stable condition.   The patient was doing well on postop day 1. His groin sheath sites were clean without evidence of hematoma. He had palpable DP pulses bilaterally. He was tolerating a diet, ambulating and voiding without difficulty. He was discharged home on postop day 1 in good condition.    CBC    Component Value Date/Time   WBC 9.6 01/07/2016 0500   RBC 3.60 (L) 01/07/2016 0500   HGB 11.4 (L) 01/07/2016 0500   HCT 34.7 (L) 01/07/2016 0500   PLT 120 (L) 01/07/2016 0500   MCV 96.4 01/07/2016 0500   MCH 31.7 01/07/2016 0500   MCHC  32.9 01/07/2016 0500   RDW 13.8 01/07/2016 0500   LYMPHSABS 1.4 01/13/2013 0938   MONOABS 0.6 01/13/2013 0938   EOSABS 0.2 01/13/2013 0938   BASOSABS 0.0 01/13/2013 0938    BMET    Component Value Date/Time   NA 135 01/07/2016 0500   K 4.0 01/07/2016 0500   CL 105 01/07/2016 0500   CO2 23 01/07/2016 0500   GLUCOSE 89 01/07/2016 0500   BUN 8 01/07/2016 0500   CREATININE 0.72 01/07/2016 0500   CREATININE 0.75 11/23/2010 1208   CALCIUM 8.8 (L) 01/07/2016 0500   GFRNONAA >60 01/07/2016 0500   GFRNONAA >60 10/24/2010 1622   GFRAA >60 01/07/2016 0500   GFRAA >60 10/24/2010 1622     Discharge Instructions:   The patient is discharged to home with extensive instructions on wound care and progressive ambulation.  They are instructed not to drive or perform any heavy lifting until returning to see the physician in his office.  Discharge Instructions    ABDOMINAL PROCEDURE/ANEURYSM REPAIR/AORTO-BIFEMORAL BYPASS:  Call MD for increased abdominal pain; cramping diarrhea; nausea/vomiting    Complete by:  As directed    Call MD for:  redness, tenderness, or signs of infection (pain, swelling, bleeding, redness, odor or green/yellow discharge around incision site)    Complete by:  As directed    Call MD for:  severe or increased pain, loss or decreased feeling  in affected limb(s)    Complete by:  As directed  Call MD for:  temperature >100.5    Complete by:  As directed    Discharge wound care:    Complete by:  As directed    Take off dressings tomorrow. Wash groins gently with soap and water and pat dry. You may apply a band-aid to your incisions.   Driving Restrictions    Complete by:  As directed    No driving while on pain medicine   Increase activity slowly    Complete by:  As directed    Walk with assistance use walker or cane as needed   Lifting restrictions    Complete by:  As directed    No heavy lifting for 2 weeks   Resume previous diet    Complete by:  As directed         Discharge Diagnosis:  Abdominal aortic aneurysm I71.4  Secondary Diagnosis: Patient Active Problem List   Diagnosis Date Noted  . Abdominal aortic aneurysm without rupture ([place]) 01/06/2016  . Essential hypertension 11/04/2014  . Pulmonary fibrosis ([place]) 04/07/2014  . CAD (coronary artery disease), native coronary artery 02/26/2014  . Hyperlipidemia 02/26/2014  . Paroxysmal atrial fibrillation ([place]) 02/26/2014  . Arthritis of knee, right 01/20/2013  . Abdominal aortic aneurysm ([place]) 05/01/2012  . Hand abscess 08/02/2010  . Tenosynovitis 08/02/2010  . 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 MSSA (methicillin susceptible Staphylococcus aureus) infection 08/02/2010  . Group A streptococcal infection 08/02/2010   Past Medical History:  Diagnosis Date  . AAA (abdominal aortic aneurysm) ([place])   . Anemia   . Arthritis    ra  . Atrial fibrillation ([place])   . COPD (chronic obstructive pulmonary disease) ([place]) Jan.2016   Small amount of Emphysema- Pulmonary Fibrosis ( Feb. 9, 2016 )  . Coronary atherosclerosis   . Dysrhythmia    A-Fib  . Hematuria, gross    HISTORY OF  ?  2004  . Hypertension    dr Daneen Schick  . Immune disorder ([place])   . Myocardial infarction 2000  . Pulmonary fibrosis ([place])   . Rheumatoid arthritis ([place])   . Shortness of breath    with exertion  . Staph skin infection    Back       Medication List    TAKE these medications   aspirin 81 MG tablet Take 81 mg by mouth daily.   atorvastatin 40 MG tablet Commonly known as:  LIPITOR Take 40 mg by mouth daily.   calcium carbonate 600 MG Tabs tablet Commonly known as:  OS-CAL Take 600 mg by mouth daily.   FLOMAX 0.4 MG Caps capsule Generic drug:  tamsulosin Take 0.4 mg by mouth daily.   hydroxychloroquine 200 MG tablet Commonly known as:  PLAQUENIL Take 200 mg by mouth 2 (two) times daily.   metoprolol tartrate 25 MG tablet Commonly known as:  LOPRESSOR Take 25 mg by mouth 2 (two) times daily.    multivitamin capsule Take 1 capsule by mouth daily.   nitroGLYCERIN 0.4 MG SL tablet Commonly known as:  NITROSTAT Place 1 tablet (0.4 mg total) under the tongue every 5 (five) minutes as needed for chest pain.   OCUVITE PRESERVISION PO Take 1 tablet by mouth daily.   omeprazole 20 MG capsule Commonly known as:  PRILOSEC Take 20 mg by mouth daily.   predniSONE 5 MG tablet Commonly known as:  DELTASONE Take 5 mg by mouth daily.   sulfaSALAzine 500 MG tablet Commonly known as:  AZULFIDINE Take 1,000 mg by mouth 2 (two)  times daily.   SYSTANE BALANCE OP Place 1 drop into both eyes 2 (two) times daily.   traMADol 50 MG tablet Commonly known as:  ULTRAM Take 1 tablet (50 mg total) by mouth every 6 (six) hours as needed for moderate pain.       Tramadol #8 No Refill  Disposition: Home  Patient's condition: is Good  Follow up: 1. Dr. Donnetta Hutching in 4 weeks with CTA   Virgina Jock, PA-C [facility] [phone] 01/12/2016  3:10 PM   - For VQI Registry use --- Instructions: Press F2 to tab through selections.  Delete question if not applicable.   Post-op:  Time to Extubation: [x ] In OR, [ ]  < 12 hrs, [ ]  12-24 hrs, [ ]  >=24 hrs Vasopressors Req. Post-op: No MI: No., [ ]  Troponin only, [ ]  EKG or Clinical New Arrhythmia: No CHF: No ICU Stay: 0 days Transfusion: No    Complications: Resp failure: No., [ ]  Pneumonia, [ ]  Ventilator Chg in renal function: No., [ ]  Inc. Cr > 0.5, [ ]  Temp. Dialysis, [ ]  Permanent dialysis Leg ischemia: No., no Surgery needed, [ ]  Yes, Surgery needed, [ ]  Amputation Bowel ischemia: No., [ ]  Medical Rx, [ ]  Surgical Rx Wound complication: No., [ ]  Superficial separation/infection, [ ]  Return to OR Return to OR: No  Return to OR for bleeding: No Stroke: No., [ ]  Minor, [ ]  Major  Discharge medications: Statin use:  Yes If No: [ ]  For Medical reasons, [ ]  Non-compliant, [ ]  Not-indicated ASA use:  Yes  If No: [  ] For Medical reasons, [ ]  Non-compliant, [ ]  Not-indicated Plavix use:  No If No: [ ]  For Medical reasons, [ ]  Non-compliant, [ x] Not-indicated Beta blocker use:  Yes If No: [ ]  For Medical reasons, [ ]  Non-compliant, [ ]  Not-indicated

## 2016-01-13 ENCOUNTER — Other Ambulatory Visit: Payer: Medicare Other

## 2016-01-14 DIAGNOSIS — Z7982 Long term (current) use of aspirin: Secondary | ICD-10-CM | POA: Diagnosis not present

## 2016-01-14 DIAGNOSIS — Z87891 Personal history of nicotine dependence: Secondary | ICD-10-CM | POA: Diagnosis not present

## 2016-01-14 DIAGNOSIS — I2510 Atherosclerotic heart disease of native coronary artery without angina pectoris: Secondary | ICD-10-CM | POA: Diagnosis not present

## 2016-01-14 DIAGNOSIS — I10 Essential (primary) hypertension: Secondary | ICD-10-CM | POA: Diagnosis not present

## 2016-01-14 DIAGNOSIS — Z48812 Encounter for surgical aftercare following surgery on the circulatory system: Secondary | ICD-10-CM | POA: Diagnosis not present

## 2016-01-18 DIAGNOSIS — Z7982 Long term (current) use of aspirin: Secondary | ICD-10-CM | POA: Diagnosis not present

## 2016-01-18 DIAGNOSIS — Z87891 Personal history of nicotine dependence: Secondary | ICD-10-CM | POA: Diagnosis not present

## 2016-01-18 DIAGNOSIS — Z48812 Encounter for surgical aftercare following surgery on the circulatory system: Secondary | ICD-10-CM | POA: Diagnosis not present

## 2016-01-18 DIAGNOSIS — I10 Essential (primary) hypertension: Secondary | ICD-10-CM | POA: Diagnosis not present

## 2016-01-18 DIAGNOSIS — I2510 Atherosclerotic heart disease of native coronary artery without angina pectoris: Secondary | ICD-10-CM | POA: Diagnosis not present

## 2016-01-19 ENCOUNTER — Encounter (HOSPITAL_COMMUNITY): Payer: Self-pay | Admitting: Vascular Surgery

## 2016-01-20 DIAGNOSIS — Z87891 Personal history of nicotine dependence: Secondary | ICD-10-CM | POA: Diagnosis not present

## 2016-01-20 DIAGNOSIS — I10 Essential (primary) hypertension: Secondary | ICD-10-CM | POA: Diagnosis not present

## 2016-01-20 DIAGNOSIS — I2510 Atherosclerotic heart disease of native coronary artery without angina pectoris: Secondary | ICD-10-CM | POA: Diagnosis not present

## 2016-01-20 DIAGNOSIS — Z7982 Long term (current) use of aspirin: Secondary | ICD-10-CM | POA: Diagnosis not present

## 2016-01-20 DIAGNOSIS — Z48812 Encounter for surgical aftercare following surgery on the circulatory system: Secondary | ICD-10-CM | POA: Diagnosis not present

## 2016-01-25 ENCOUNTER — Telehealth: Payer: Self-pay

## 2016-01-25 NOTE — Telephone Encounter (Signed)
Notified pt's wife of approval, per Dr. Donzetta Matters, for pt. to have flu shot.  Verb. Understanding.

## 2016-01-25 NOTE — Telephone Encounter (Signed)
-----   Message from Waynetta Sandy, MD sent at 01/25/2016  3:26 PM EDT ----- Regarding: RE: question about a flu shot Sounds prudent to get it.   [name] ----- Message ----- From: Denman George, RN Sent: 01/25/2016   3:05 PM To: Waynetta Sandy, MD Subject: question about a flu shot                      S/p [facility] 01/06/16; family called to inquire if okay to get flu shot; since he is 2 weeks post op, it shouldn't be a problem should it?

## 2016-01-26 DIAGNOSIS — Z7982 Long term (current) use of aspirin: Secondary | ICD-10-CM | POA: Diagnosis not present

## 2016-01-26 DIAGNOSIS — Z23 Encounter for immunization: Secondary | ICD-10-CM | POA: Diagnosis not present

## 2016-01-26 DIAGNOSIS — I10 Essential (primary) hypertension: Secondary | ICD-10-CM | POA: Diagnosis not present

## 2016-01-26 DIAGNOSIS — I2510 Atherosclerotic heart disease of native coronary artery without angina pectoris: Secondary | ICD-10-CM | POA: Diagnosis not present

## 2016-01-26 DIAGNOSIS — Z87891 Personal history of nicotine dependence: Secondary | ICD-10-CM | POA: Diagnosis not present

## 2016-01-26 DIAGNOSIS — Z48812 Encounter for surgical aftercare following surgery on the circulatory system: Secondary | ICD-10-CM | POA: Diagnosis not present

## 2016-01-28 DIAGNOSIS — Z48812 Encounter for surgical aftercare following surgery on the circulatory system: Secondary | ICD-10-CM | POA: Diagnosis not present

## 2016-01-28 DIAGNOSIS — Z7982 Long term (current) use of aspirin: Secondary | ICD-10-CM | POA: Diagnosis not present

## 2016-01-28 DIAGNOSIS — I10 Essential (primary) hypertension: Secondary | ICD-10-CM | POA: Diagnosis not present

## 2016-01-28 DIAGNOSIS — I2510 Atherosclerotic heart disease of native coronary artery without angina pectoris: Secondary | ICD-10-CM | POA: Diagnosis not present

## 2016-01-28 DIAGNOSIS — Z87891 Personal history of nicotine dependence: Secondary | ICD-10-CM | POA: Diagnosis not present

## 2016-02-01 DIAGNOSIS — Z87891 Personal history of nicotine dependence: Secondary | ICD-10-CM | POA: Diagnosis not present

## 2016-02-01 DIAGNOSIS — I2510 Atherosclerotic heart disease of native coronary artery without angina pectoris: Secondary | ICD-10-CM | POA: Diagnosis not present

## 2016-02-01 DIAGNOSIS — I10 Essential (primary) hypertension: Secondary | ICD-10-CM | POA: Diagnosis not present

## 2016-02-01 DIAGNOSIS — Z7982 Long term (current) use of aspirin: Secondary | ICD-10-CM | POA: Diagnosis not present

## 2016-02-01 DIAGNOSIS — Z48812 Encounter for surgical aftercare following surgery on the circulatory system: Secondary | ICD-10-CM | POA: Diagnosis not present

## 2016-02-09 ENCOUNTER — Encounter: Payer: Self-pay | Admitting: Vascular Surgery

## 2016-02-10 DIAGNOSIS — M2550 Pain in unspecified joint: Secondary | ICD-10-CM | POA: Diagnosis not present

## 2016-02-10 DIAGNOSIS — J8410 Pulmonary fibrosis, unspecified: Secondary | ICD-10-CM | POA: Diagnosis not present

## 2016-02-10 DIAGNOSIS — M0579 Rheumatoid arthritis with rheumatoid factor of multiple sites without organ or systems involvement: Secondary | ICD-10-CM | POA: Diagnosis not present

## 2016-02-10 DIAGNOSIS — Z79899 Other long term (current) drug therapy: Secondary | ICD-10-CM | POA: Diagnosis not present

## 2016-02-11 ENCOUNTER — Ambulatory Visit: Payer: Medicare Other | Admitting: Vascular Surgery

## 2016-02-14 ENCOUNTER — Ambulatory Visit (HOSPITAL_COMMUNITY)
Admission: RE | Admit: 2016-02-14 | Discharge: 2016-02-14 | Disposition: A | Discharge status: Home / Self Care | Payer: Medicare Other | Source: Ambulatory Visit | Attending: Vascular Surgery | Admitting: Vascular Surgery

## 2016-02-14 DIAGNOSIS — Z95828 Presence of other vascular implants and grafts: Secondary | ICD-10-CM | POA: Diagnosis not present

## 2016-02-14 DIAGNOSIS — I714 Abdominal aortic aneurysm, without rupture: Secondary | ICD-10-CM | POA: Diagnosis not present

## 2016-02-14 DIAGNOSIS — K449 Diaphragmatic hernia without obstruction or gangrene: Secondary | ICD-10-CM | POA: Diagnosis not present

## 2016-02-14 DIAGNOSIS — N200 Calculus of kidney: Secondary | ICD-10-CM | POA: Insufficient documentation

## 2016-02-14 DIAGNOSIS — K5730 Diverticulosis of large intestine without perforation or abscess without bleeding: Secondary | ICD-10-CM | POA: Diagnosis not present

## 2016-02-14 DIAGNOSIS — K8020 Calculus of gallbladder without cholecystitis without obstruction: Secondary | ICD-10-CM | POA: Insufficient documentation

## 2016-02-14 DIAGNOSIS — Z48812 Encounter for surgical aftercare following surgery on the circulatory system: Secondary | ICD-10-CM | POA: Diagnosis not present

## 2016-02-14 MED ORDER — IOPAMIDOL (ISOVUE-370) INJECTION 76%
100.0000 mL | Freq: Once | INTRAVENOUS | Status: AC | PRN
  Administered 2016-02-14: 100 mL via INTRAVENOUS

## 2016-02-16 ENCOUNTER — Ambulatory Visit (INDEPENDENT_AMBULATORY_CARE_PROVIDER_SITE_OTHER): Payer: Medicare Other | Admitting: Vascular Surgery

## 2016-02-16 ENCOUNTER — Encounter: Payer: Self-pay | Admitting: Vascular Surgery

## 2016-02-16 VITALS — BP 134/84 | HR 86 | Temp 97.6°F | Resp 16 | Ht 70.0 in | Wt 186.0 lb

## 2016-02-16 DIAGNOSIS — I714 Abdominal aortic aneurysm, without rupture, unspecified: Secondary | ICD-10-CM

## 2016-02-16 NOTE — Addendum Note (Signed)
Addended by: Lianne Cure A on: 02/16/2016 02:47 PM   Modules accepted: Orders

## 2016-02-16 NOTE — Progress Notes (Signed)
Subjective:     [name], male   DOB: [birth year], 82 y.o.   MRN: [id number]  HPI Mr. returns following Marylouise Stacks was performed for greater than 5 semirigid aneurysm. He has been doing well healed his groin incisions and his hematoma in his right groin without issue. He feels like he should be back to his normal level of activity is only complaint is back pain and left ankle pain which is residual from previous with peak injuries.  Review of Systems Back pain   Objective:   Physical Exam aaox3 Abdomen soft without mass Feet are warm Widened left popliteal artery 2+pulse   IMPRESSION: VASCULAR  Endovascular repair of the abdominal aortic aneurysm. There is a type 2 endoleak which appears to be related to lumbar arteries. Aortic aneurysm sac is stable in size measuring 5.1 cm.  Narrowing at the celiac trunk origin appears to be secondary to median arcuate ligament compression.     Assessment/Plan    82 year old white male here for follow-up from recent [place]. There is a type II endoleak from lumbar artery that was noted intraoperative as well. I cannot feel a pulsatile mass in his abdomen and his aneurysm is stable size at this time. We'll have him follow-up 6 months with repeat ultrasound as well as ultrasound of his bilateral lower extremities to evaluate for popliteal artery aneurysms.  Ashiya Kinkead C. Donzetta Matters, MD [facility] Office: [phone] Pager: [phone]

## 2016-04-18 DIAGNOSIS — H11153 Pinguecula, bilateral: Secondary | ICD-10-CM | POA: Diagnosis not present

## 2016-04-18 DIAGNOSIS — H35033 Hypertensive retinopathy, bilateral: Secondary | ICD-10-CM | POA: Diagnosis not present

## 2016-04-18 DIAGNOSIS — Z9849 Cataract extraction status, unspecified eye: Secondary | ICD-10-CM | POA: Diagnosis not present

## 2016-04-18 DIAGNOSIS — Z961 Presence of intraocular lens: Secondary | ICD-10-CM | POA: Diagnosis not present

## 2016-04-18 DIAGNOSIS — H353131 Nonexudative age-related macular degeneration, bilateral, early dry stage: Secondary | ICD-10-CM | POA: Diagnosis not present

## 2016-04-18 DIAGNOSIS — H40023 Open angle with borderline findings, high risk, bilateral: Secondary | ICD-10-CM | POA: Diagnosis not present

## 2016-04-18 DIAGNOSIS — H02875 Vascular anomalies of left lower eyelid: Secondary | ICD-10-CM | POA: Diagnosis not present

## 2016-04-18 DIAGNOSIS — H3530 Unspecified macular degeneration: Secondary | ICD-10-CM | POA: Diagnosis not present

## 2016-04-18 DIAGNOSIS — Z79899 Other long term (current) drug therapy: Secondary | ICD-10-CM | POA: Diagnosis not present

## 2016-04-18 DIAGNOSIS — H11423 Conjunctival edema, bilateral: Secondary | ICD-10-CM | POA: Diagnosis not present

## 2016-04-18 DIAGNOSIS — H18413 Arcus senilis, bilateral: Secondary | ICD-10-CM | POA: Diagnosis not present

## 2016-04-18 DIAGNOSIS — H5203 Hypermetropia, bilateral: Secondary | ICD-10-CM | POA: Diagnosis not present

## 2016-05-01 ENCOUNTER — Other Ambulatory Visit: Payer: Self-pay | Admitting: Internal Medicine

## 2016-05-01 DIAGNOSIS — R0600 Dyspnea, unspecified: Secondary | ICD-10-CM

## 2016-05-02 ENCOUNTER — Ambulatory Visit (INDEPENDENT_AMBULATORY_CARE_PROVIDER_SITE_OTHER)
Admission: RE | Admit: 2016-05-02 | Discharge: 2016-05-02 | Disposition: A | Discharge status: Home / Self Care | Payer: Medicare Other | Source: Ambulatory Visit | Attending: Internal Medicine | Admitting: Internal Medicine

## 2016-05-02 ENCOUNTER — Encounter: Payer: Self-pay | Admitting: Internal Medicine

## 2016-05-02 ENCOUNTER — Ambulatory Visit (INDEPENDENT_AMBULATORY_CARE_PROVIDER_SITE_OTHER): Payer: Medicare Other | Admitting: Internal Medicine

## 2016-05-02 VITALS — BP 108/70 | HR 92 | Ht 69.0 in | Wt 190.0 lb

## 2016-05-02 DIAGNOSIS — J8410 Pulmonary fibrosis, unspecified: Secondary | ICD-10-CM

## 2016-05-02 DIAGNOSIS — R0600 Dyspnea, unspecified: Secondary | ICD-10-CM | POA: Diagnosis not present

## 2016-05-02 DIAGNOSIS — R0602 Shortness of breath: Secondary | ICD-10-CM | POA: Diagnosis not present

## 2016-05-02 LAB — PULMONARY FUNCTION TEST
DL/VA % pred: 83 %
DL/VA: 3.75 ml/min/mmHg/L
DLCO COR % PRED: 72 %
DLCO UNC: 20.66 ml/min/mmHg
DLCO cor: 22.33 ml/min/mmHg
DLCO unc % pred: 66 %
FEF 25-75 POST: 2.59 L/s
FEF 25-75 Pre: 2.28 L/sec
FEF2575-%Change-Post: 13 %
FEF2575-%PRED-PRE: 133 %
FEF2575-%Pred-Post: 152 %
FEV1-%Change-Post: 3 %
FEV1-%PRED-POST: 106 %
FEV1-%PRED-PRE: 103 %
FEV1-POST: 2.76 L
FEV1-Pre: 2.68 L
FEV1FVC-%Change-Post: 2 %
FEV1FVC-%PRED-PRE: 108 %
FEV6-%Change-Post: 0 %
FEV6-%PRED-POST: 101 %
FEV6-%Pred-Pre: 100 %
FEV6-POST: 3.49 L
FEV6-Pre: 3.46 L
FEV6FVC-%CHANGE-POST: 0 %
FEV6FVC-%PRED-POST: 106 %
FEV6FVC-%Pred-Pre: 107 %
FVC-%CHANGE-POST: 0 %
FVC-%Pred-Post: 94 %
FVC-%Pred-Pre: 93 %
FVC-Post: 3.51 L
FVC-Pre: 3.47 L
POST FEV6/FVC RATIO: 99 %
PRE FEV1/FVC RATIO: 77 %
PRE FEV6/FVC RATIO: 100 %
Post FEV1/FVC ratio: 79 %
RV % pred: 73 %
RV: 1.98 L
TLC % PRED: 81 %
TLC: 5.57 L

## 2016-05-02 NOTE — Progress Notes (Signed)
Subjective:     [name], male   DOB: Aug 22, 1930,    MRN: [id number]    Brief patient profile:  70 yowm quit smoking 1980  with steroid dep RA f/u [facility] and PF f/u by [facility] previously.    11/03/2014 1st   office visit/ [name]  / on Altace  Chief Complaint  Patient presents with  . Follow-up    Former pt of [name]. Pt states that his breathing is doing well and denies any new co's today.   limited by back more than sob / arthritis good control since last ov here rec No change rx    05/03/2015  f/u ov/[name] re:  RA/ pf on Altace  Chief Complaint  Patient presents with  . Follow-up    Breathing is doing well.  He has occ cough with "cloudy" sputum.    wife is hearing wheezing also but it doesn't bother him  rec Your pulmonary fibrosis is likely due to your rheumatism and as long as this is well controlled your fibrosis should not get worse See Tamala Julian re  Your Altace - the only way to know to what extent it's causing your voice/ cough/ throat congestion and urge to clear your throat  Please schedule a follow up visit in 12 months but call sooner if needed with pfts and cxr on return     05/02/2016  f/u ov/[name] re: RA/ pf off acei  Chief Complaint  Patient presents with  . Follow-up    Breathing is unchanged. No new co's today.   back stops him before the breathing but able to do neighbor walk slow but some hills / doesn't walk much at stores  Not much cough at all off acei and certainly no excess/ purulent sputum or mucus plugs    No obvious day to day or daytime variability or assoc    cp or chest tightness,   or overt sinus or hb symptoms. No unusual exp hx or h/o childhood pna/ asthma or knowledge of premature birth.  Sleeping ok without nocturnal  or early am exacerbation  of respiratory  c/o's or need for noct saba. Also denies any obvious fluctuation of symptoms with weather or environmental changes or other aggravating or alleviating factors except as outlined  above   Current Medications, Allergies, Complete Past Medical History, Past Surgical History, Family History, and Social History were reviewed in [employer] record.  ROS  The following are not active complaints unless bolded sore throat, dysphagia, dental problems, itching, sneezing,  nasal congestion or excess/ purulent secretions, ear ache,   fever, chills, sweats, unintended wt loss, classically pleuritic or exertional cp, hemoptysis,  orthopnea pnd or leg swelling, presyncope, palpitations, abdominal pain, anorexia, nausea, vomiting, diarrhea  or change in bowel or bladder habits, change in stools or urine, dysuria,hematuria,  rash, arthralgias about avg for him, visual complaints, headache, numbness, weakness or ataxia or problems with walking or coordination,  change in mood/affect or memory.            Objective:   Physical Exam   amb wm nad    05/02/2016         190  05/03/2015         188   11/03/14 185 lb (83.915 kg)  05/18/14 190 lb (86.183 kg)  05/05/14 189 lb 9.6 oz (86.002 kg)    Vital signs reviewed  - Note on arrival 02 sats  94% on RA  HEENT: nl dentition, turbinates, and orophanx. Nl external ear canals without cough reflex   NECK :  without JVD/Nodes/TM/ nl carotid upstrokes bilaterally   LUNGS: no acc muscle use,   Mild coarsening of bs bilaterally s classic crackles   without cough on insp or exp maneuvers   CV:  RRR  no s3 or murmur or increase in P2, no edema   ABD:  soft and nontender with nl excursion in the supine position. No bruits or organomegaly, bowel sounds nl  MS:  warm with  RA  Deformities  R>>  L hand  calf tenderness, cyanosis or clubbing  SKIN: warm and dry without lesions    NEURO:  alert, approp, no deficits       CXR PA and Lateral:   05/02/2016 :    I personally reviewed images and agree with radiology impression as follows:    Severe chronic lung disease/fibrosis.  No definite acute process.        Assessment:        Outpatient Encounter Prescriptions as of 05/02/2016  Medication Sig  . aspirin 81 MG tablet Take 81 mg by mouth daily.  Marland Kitchen atorvastatin (LIPITOR) 40 MG tablet Take 40 mg by mouth daily.  . calcium carbonate (OS-CAL) 600 MG TABS Take 600 mg by mouth daily.   . carboxymethylcellulose (REFRESH PLUS) 0.5 % SOLN 1 drop 3 (three) times daily as needed.  . hydroxychloroquine (PLAQUENIL) 200 MG tablet Take 200 mg by mouth 2 (two) times daily.   . metoprolol tartrate (LOPRESSOR) 25 MG tablet Take 25 mg by mouth 2 (two) times daily.  . Multiple Vitamin (MULTIVITAMIN) capsule Take 1 capsule by mouth daily.    . Multiple Vitamins-Minerals (OCUVITE PRESERVISION PO) Take 1 tablet by mouth daily.  . nitroGLYCERIN (NITROSTAT) 0.4 MG SL tablet Place 1 tablet (0.4 mg total) under the tongue every 5 (five) minutes as needed for chest pain.  Marland Kitchen omeprazole (PRILOSEC) 20 MG capsule Take 20 mg by mouth daily.    . predniSONE (DELTASONE) 5 MG tablet Take 5 mg by mouth daily.    Marland Kitchen sulfaSALAzine (AZULFIDINE) 500 MG tablet Take 1,000 mg by mouth 2 (two) times daily.   . Tamsulosin HCl (FLOMAX) 0.4 MG CAPS Take 0.4 mg by mouth daily.   . [DISCONTINUED] Propylene Glycol (SYSTANE BALANCE OP) Place 1 drop into both eyes 2 (two) times daily.  . [DISCONTINUED] ramipril (ALTACE) 2.5 MG capsule TAKE 1 CAPSULE EVERY DAY  . [DISCONTINUED] traMADol (ULTRAM) 50 MG tablet Take 1 tablet (50 mg total) by mouth every 6 (six) hours as needed for moderate pain. (Patient not taking: Reported on 05/02/2016)   No facility-administered encounter medications on file as of 05/02/2016.

## 2016-05-02 NOTE — Patient Instructions (Addendum)
Please remember to go to the  x-ray department downstairs in the basement  for your tests - we will call you with the results when they are available.      If you are satisfied with your treatment plan,  let your doctor know and he/she can either refill your medications or you can return here when your prescription runs out.     If in any way you are not 100% satisfied,  please tell us.  If 100% better, tell your friends!  Pulmonary follow up is as needed   

## 2016-05-02 NOTE — Progress Notes (Signed)
Spoke with pt and notified of results per Dr. Wert. Pt verbalized understanding and denied any questions. 

## 2016-05-03 NOTE — Assessment & Plan Note (Signed)
-   HRCT 04/10/14 1. The appearance of the lungs does indicate underlying interstitial lung disease, and the pattern is favored to reflect usual interstitial pneumonia (UIP). The findings described above have clearly progressed compared to remote prior study 03/26/2004 where the lung bases were incidentally imaged on a CT of the abdomen and pelvis. 2. In addition, there is diffuse bronchial wall thickening with moderate centrilobular and paraseptal emphysema; imaging findings suggesting concurrent COPD. - PFTs 04/30/14  VC  3.96 (104%)  s obstruction and dlco 66% corrects to 69% - 11/03/2014  Walked RA x 3 laps @ 185 ft each stopped due to  End of study, nl pace, no sob or desat   > ok to d/c 02  - PFT's  05/03/2015   FVC  3.70 (97%) no obst with DLCO  65 % corrects to 71 % for alv volume   - PFTs  05/02/2016    FVC  3.47 (93%) no obest with DLCO 66/67% corrects 83% for alv vol   I had an extended discussion with the patient reviewing all relevant studies completed to date and  lasting 15 to 20 minutes of a 25 minute visit on the following ongoing concerns:   No evidence of dz progression typical of pt with underlying connective tissue dz under adequate control and having no symptoms off acei.  If he were more physically active he might have doe but presently Not limited by breathing from desired activities    Pulmonary f/u can be prn

## 2016-05-11 DIAGNOSIS — Z23 Encounter for immunization: Secondary | ICD-10-CM | POA: Diagnosis not present

## 2016-05-11 DIAGNOSIS — Z1389 Encounter for screening for other disorder: Secondary | ICD-10-CM | POA: Diagnosis not present

## 2016-05-11 DIAGNOSIS — Z0000 Encounter for general adult medical examination without abnormal findings: Secondary | ICD-10-CM | POA: Diagnosis not present

## 2016-05-11 DIAGNOSIS — I10 Essential (primary) hypertension: Secondary | ICD-10-CM | POA: Diagnosis not present

## 2016-05-11 DIAGNOSIS — E7800 Pure hypercholesterolemia, unspecified: Secondary | ICD-10-CM | POA: Diagnosis not present

## 2016-05-11 DIAGNOSIS — Q253 Supravalvular aortic stenosis: Secondary | ICD-10-CM | POA: Diagnosis not present

## 2016-05-11 DIAGNOSIS — G3184 Mild cognitive impairment, so stated: Secondary | ICD-10-CM | POA: Diagnosis not present

## 2016-05-11 DIAGNOSIS — M069 Rheumatoid arthritis, unspecified: Secondary | ICD-10-CM | POA: Diagnosis not present

## 2016-05-11 DIAGNOSIS — Z79899 Other long term (current) drug therapy: Secondary | ICD-10-CM | POA: Diagnosis not present

## 2016-05-11 DIAGNOSIS — K219 Gastro-esophageal reflux disease without esophagitis: Secondary | ICD-10-CM | POA: Diagnosis not present

## 2016-05-11 DIAGNOSIS — J8410 Pulmonary fibrosis, unspecified: Secondary | ICD-10-CM | POA: Diagnosis not present

## 2016-05-16 ENCOUNTER — Ambulatory Visit (INDEPENDENT_AMBULATORY_CARE_PROVIDER_SITE_OTHER): Payer: Medicare Other | Admitting: Ophthalmology

## 2016-05-16 DIAGNOSIS — H35033 Hypertensive retinopathy, bilateral: Secondary | ICD-10-CM

## 2016-05-16 DIAGNOSIS — I10 Essential (primary) hypertension: Secondary | ICD-10-CM | POA: Diagnosis not present

## 2016-05-16 DIAGNOSIS — M069 Rheumatoid arthritis, unspecified: Secondary | ICD-10-CM

## 2016-05-16 DIAGNOSIS — H353122 Nonexudative age-related macular degeneration, left eye, intermediate dry stage: Secondary | ICD-10-CM | POA: Diagnosis not present

## 2016-05-16 DIAGNOSIS — H43813 Vitreous degeneration, bilateral: Secondary | ICD-10-CM | POA: Diagnosis not present

## 2016-07-10 NOTE — Progress Notes (Signed)
Error

## 2016-07-10 NOTE — Progress Notes (Signed)
Cardiology Office Note    Date:  07/11/2016   ID:  [name], DOB [birth year], MRN [id number]  PCP:  Mathews Argyle, MD  Cardiologist: Sinclair Grooms, MD   Chief Complaint  Patient presents with  . Coronary Artery Disease    History of Present Illness:  [name] is a 83 y.o. male for Coronary artery disease with bare-metal stent right coronary, abdominal aortic aneurysm followed by Dr. Kellie Simmering, hypertension, and interstitial lung /fibrosis disease.  He is maintaining a relatively active lifestyle and wedge there has been no chest pain, orthopnea, palpitations, or syncope. No lower extremity swelling. Denies PND.  Past Medical History:  Diagnosis Date  . AAA (abdominal aortic aneurysm) ([place])   . Anemia   . Arthritis    ra  . Atrial fibrillation ([place])   . COPD (chronic obstructive pulmonary disease) ([place]) Jan.2016   Small amount of Emphysema- Pulmonary Fibrosis ( Feb. 9, 2016 )  . Coronary atherosclerosis   . Dysrhythmia    A-Fib  . Hematuria, gross    HISTORY OF  ?  2004  . Hypertension    dr Daneen Schick  . Immune disorder ([place])   . Myocardial infarction ([place]) 2000  . Pulmonary fibrosis ([place])   . Rheumatoid arthritis ([place])   . Shortness of breath    with exertion  . Staph skin infection    Back    Past Surgical History:  Procedure Laterality Date  . ABDOMINAL AORTIC ENDOVASCULAR STENT GRAFT N/A 01/06/2016   Procedure: ABDOMINAL AORTIC ENDOVASCULAR STENT GRAFT;  Surgeon: Waynetta Sandy, MD;  Location: [facility] OR;  Service: Vascular;  Laterality: N/A;  . arm surgery     result of staph infection  . BACK SURGERY    . CORONARY ANGIOPLASTY    . ESOPHAGOGASTRODUODENOSCOPY  2004    ?  . esophagus wrapped    . EYE SURGERY Bilateral 03-14-13   [place]  . HAND SURGERY     rel to staph  . JOINT REPLACEMENT     bil hips  . JOINT REPLACEMENT Right 01-20-13   Knee  . stents  2000  . TOTAL KNEE ARTHROPLASTY Right 01/20/2013   Procedure:  RIGHT TOTAL KNEE ARTHROPLASTY;  Surgeon: Kerin Salen, MD;  Location: [facility];  Service: Orthopedics;  Laterality: Right;    Current Medications: Outpatient Medications Prior to Visit  Medication Sig Dispense Refill  . aspirin 81 MG tablet Take 81 mg by mouth daily.    Marland Kitchen atorvastatin (LIPITOR) 40 MG tablet Take 40 mg by mouth daily.    . calcium carbonate (OS-CAL) 600 MG TABS Take 600 mg by mouth daily.     . carboxymethylcellulose (REFRESH PLUS) 0.5 % SOLN 1 drop 3 (three) times daily as needed.    . hydroxychloroquine (PLAQUENIL) 200 MG tablet Take 200 mg by mouth 2 (two) times daily.     . metoprolol tartrate (LOPRESSOR) 25 MG tablet Take 25 mg by mouth 2 (two) times daily.    . Multiple Vitamin (MULTIVITAMIN) capsule Take 1 capsule by mouth daily.      . Multiple Vitamins-Minerals (OCUVITE PRESERVISION PO) Take 1 tablet by mouth daily.    . nitroGLYCERIN (NITROSTAT) 0.4 MG SL tablet Place 1 tablet (0.4 mg total) under the tongue every 5 (five) minutes as needed for chest pain. 25 tablet 3  . omeprazole (PRILOSEC) 20 MG capsule Take 20 mg by mouth daily.      . predniSONE (DELTASONE) 5 MG tablet  Take 5 mg by mouth daily.      Marland Kitchen sulfaSALAzine (AZULFIDINE) 500 MG tablet Take 1,000 mg by mouth 2 (two) times daily.     . Tamsulosin HCl (FLOMAX) 0.4 MG CAPS Take 0.4 mg by mouth daily.      No facility-administered medications prior to visit.      Allergies:   Morphine and Morphine and related   Social History   Social History  . Marital status: Married    Spouse name: N/A  . Number of children: N/A  . Years of education: N/A   Social History Main Topics  . Smoking status: Former Smoker    Packs/day: 3.00    Years: 50.00    Types: Cigarettes    Quit date: 03/27/1978  . Smokeless tobacco: Former Systems developer    Types: Chew  . Alcohol use No  . Drug use: No  . Sexual activity: Not Asked   Other Topics Concern  . None   Social History Narrative  . None     Family History:  The  patient's family history includes Bleeding Disorder in his father; COPD in his father and sister; Diabetes in his son; Heart attack in his brother and son; Heart disease in his brother and son; Hypertension in his brother and son; Stroke in his brother; Varicose Veins in his sister.   ROS:   Please see the history of present illness.    Hearing loss is progressive.  All other systems reviewed and are negative.   PHYSICAL EXAM:   VS:  BP 126/76 (BP Location: Left Arm)   Pulse 68   Ht 5\' 10"  (1.778 m)   Wt 190 lb 12.8 oz (86.5 kg)   BMI 27.38 kg/m    GEN: Well nourished, well developed, in no acute distress  HEENT: normal  Neck: no JVD, carotid bruits, or masses Cardiac: RRR; rubs, or gallops,no edema . 2/6 crescendo decrescendo right upper sternal border systolic murmur compatible with aortic valve etiology. Respiratory:  clear to auscultation bilaterally, normal work of breathing GI: soft, nontender, nondistended, + BS MS: no deformity or atrophy  Skin: warm and dry, no rash Neuro:  Alert and Oriented x 3, Strength and sensation are intact Psych: euthymic mood, full affect  Wt Readings from Last 3 Encounters:  07/11/16 190 lb 12.8 oz (86.5 kg)  05/02/16 190 lb (86.2 kg)  02/16/16 186 lb (84.4 kg)      Studies/Labs Reviewed:   EKG:  EKG  Sinus rhythm, first-degree AV block, inferior infarct, age undetermined. Prominent voltage with suggestion of LVH. No significant change compared to prior.  Recent Labs: 01/03/2016: ALT 14 01/06/2016: Magnesium 1.8 01/07/2016: BUN 8; Creatinine, Ser 0.72; Hemoglobin 11.4; Platelets 120; Potassium 4.0; Sodium 135   Lipid Panel No results found for: CHOL, TRIG, HDL, CHOLHDL, VLDL, LDLCALC, LDLDIRECT  Additional studies/ records that were reviewed today include:  No new/updated imaging or blood flow studies.    ASSESSMENT:    1. Coronary artery disease of native artery of native heart with stable angina pectoris ([place])   2. Abdominal  aortic aneurysm without rupture ([place])   3. Essential hypertension   4. Paroxysmal atrial fibrillation ([facility])      PLAN:  In order of problems listed above:  1. Stable without angina. No episodes of syncope. Overall, doing well. Have decided against functional testing in the absence of symptoms. Continue risk factor modification. 2. Followed by vascular vein specialist [place]. 3. Blood pressures well controlled. Target is 130/70  mmHg. 4. No symptomatic recurrences of atrial fibrillation. Continue to monitor for recurrence.  Clinical follow-up in one year. No change in therapy. Encouraged to call if any chest discomfort or change in breathing.  Medication Adjustments/Labs and Tests Ordered: Current medicines are reviewed at length with the patient today.  Concerns regarding medicines are outlined above.  Medication changes, Labs and Tests ordered today are listed in the Patient Instructions below. Patient Instructions  Medication Instructions:  None  Labwork: None  Testing/Procedures: None  Follow-Up: Your physician wants you to follow-up in: 1 year with Dr. Tamala Julian.  You will receive a reminder letter in the mail two months in advance. If you don't receive a letter, please call our office to schedule the follow-up appointment.   Any Other Special Instructions Will Be Listed Below (If Applicable).     If you need a refill on your cardiac medications before your next appointment, please call your pharmacy.      Signed, Sinclair Grooms, MD  07/11/2016 8:56 AM    [facility] [address], [place], [place]  [address] Phone: [phone]; Fax: [phone]

## 2016-07-11 ENCOUNTER — Ambulatory Visit (INDEPENDENT_AMBULATORY_CARE_PROVIDER_SITE_OTHER): Payer: Medicare Other | Admitting: Interventional Cardiology

## 2016-07-11 ENCOUNTER — Encounter: Payer: Self-pay | Admitting: Interventional Cardiology

## 2016-07-11 VITALS — BP 126/76 | HR 68 | Ht 70.0 in | Wt 190.8 lb

## 2016-07-11 DIAGNOSIS — I25118 Atherosclerotic heart disease of native coronary artery with other forms of angina pectoris: Secondary | ICD-10-CM | POA: Diagnosis not present

## 2016-07-11 DIAGNOSIS — I209 Angina pectoris, unspecified: Secondary | ICD-10-CM

## 2016-07-11 DIAGNOSIS — I714 Abdominal aortic aneurysm, without rupture, unspecified: Secondary | ICD-10-CM

## 2016-07-11 DIAGNOSIS — I10 Essential (primary) hypertension: Secondary | ICD-10-CM | POA: Diagnosis not present

## 2016-07-11 DIAGNOSIS — I480 Paroxysmal atrial fibrillation: Secondary | ICD-10-CM | POA: Diagnosis not present

## 2016-07-11 NOTE — Patient Instructions (Signed)

## 2016-07-18 DIAGNOSIS — M545 Low back pain: Secondary | ICD-10-CM | POA: Diagnosis not present

## 2016-08-04 DIAGNOSIS — J8410 Pulmonary fibrosis, unspecified: Secondary | ICD-10-CM | POA: Diagnosis not present

## 2016-08-04 DIAGNOSIS — E663 Overweight: Secondary | ICD-10-CM | POA: Diagnosis not present

## 2016-08-04 DIAGNOSIS — M0579 Rheumatoid arthritis with rheumatoid factor of multiple sites without organ or systems involvement: Secondary | ICD-10-CM | POA: Diagnosis not present

## 2016-08-04 DIAGNOSIS — Z79899 Other long term (current) drug therapy: Secondary | ICD-10-CM | POA: Diagnosis not present

## 2016-08-04 DIAGNOSIS — M2550 Pain in unspecified joint: Secondary | ICD-10-CM | POA: Diagnosis not present

## 2016-08-04 DIAGNOSIS — Z6828 Body mass index (BMI) 28.0-28.9, adult: Secondary | ICD-10-CM | POA: Diagnosis not present

## 2016-08-15 DIAGNOSIS — M545 Low back pain: Secondary | ICD-10-CM | POA: Diagnosis not present

## 2016-08-16 ENCOUNTER — Encounter: Payer: Self-pay | Admitting: Vascular Surgery

## 2016-08-25 ENCOUNTER — Encounter: Payer: Self-pay | Admitting: Vascular Surgery

## 2016-08-25 ENCOUNTER — Ambulatory Visit (INDEPENDENT_AMBULATORY_CARE_PROVIDER_SITE_OTHER): Payer: Medicare Other | Admitting: Vascular Surgery

## 2016-08-25 ENCOUNTER — Ambulatory Visit (HOSPITAL_COMMUNITY)
Admission: RE | Admit: 2016-08-25 | Discharge: 2016-08-25 | Disposition: A | Discharge status: Home / Self Care | Payer: Medicare Other | Source: Ambulatory Visit | Attending: Vascular Surgery | Admitting: Vascular Surgery

## 2016-08-25 ENCOUNTER — Ambulatory Visit (INDEPENDENT_AMBULATORY_CARE_PROVIDER_SITE_OTHER)
Admission: RE | Admit: 2016-08-25 | Discharge: 2016-08-25 | Disposition: A | Discharge status: Home / Self Care | Payer: Medicare Other | Source: Ambulatory Visit | Attending: Vascular Surgery | Admitting: Vascular Surgery

## 2016-08-25 VITALS — BP 129/86 | HR 81 | Temp 97.1°F | Resp 16 | Ht 71.0 in | Wt 194.0 lb

## 2016-08-25 DIAGNOSIS — R0989 Other specified symptoms and signs involving the circulatory and respiratory systems: Secondary | ICD-10-CM | POA: Insufficient documentation

## 2016-08-25 DIAGNOSIS — I714 Abdominal aortic aneurysm, without rupture, unspecified: Secondary | ICD-10-CM

## 2016-08-25 NOTE — Progress Notes (Signed)
Vitals:   08/25/16 0901  BP: (!) 148/80  Pulse: 81  Resp: 16  Temp: 97.1 F (36.2 C)  SpO2: 93%  Weight: 194 lb (88 kg)  Height: 5\' 11"  (1.803 m)

## 2016-08-25 NOTE — Progress Notes (Signed)
[name], male   DOB: [birth year], 83 y.o.   MRN: [id number]  Reason for Consult: AAA (f/u)   Referred by Lajean Manes, MD  Subjective:     HPI:  [name] is a 83 y.o. male underwent endovascular aneurysm repair last October for expanding aneurysm. He has done very well since this time. He does have chronic back pain has no abdominal pain. He is walking without issue at this time. He had ultrasound prior he had ultrasound performed prior to today's visit. Currently taking aspirin and statin.  Past Medical History:  Diagnosis Date  . AAA (abdominal aortic aneurysm) ([place])   . Anemia   . Arthritis    ra  . Atrial fibrillation ([place])   . COPD (chronic obstructive pulmonary disease) ([place]) Jan.2016   Small amount of Emphysema- Pulmonary Fibrosis ( Feb. 9, 2016 )  . Coronary atherosclerosis   . Dysrhythmia    A-Fib  . Hematuria, gross    HISTORY OF  ?  2004  . Hypertension    dr Daneen Schick  . Immune disorder ([place])   . Myocardial infarction ([place]) 2000  . Pulmonary fibrosis ([place])   . Rheumatoid arthritis ([place])   . Shortness of breath    with exertion  . Staph skin infection    Back   Family History  Problem Relation Age of Onset  . Heart disease Brother        Heart Disease before age 32  . Stroke Brother   . Heart attack Brother   . Hypertension Brother   . Diabetes Son   . Heart disease Son        Heart Disease before age 48  . Hypertension Son   . Heart attack Son   . Bleeding Disorder Father   . COPD Father        Emphysema  . Varicose Veins Sister   . COPD Sister    Past Surgical History:  Procedure Laterality Date  . ABDOMINAL AORTIC ENDOVASCULAR STENT GRAFT N/A 01/06/2016   Procedure: ABDOMINAL AORTIC ENDOVASCULAR STENT GRAFT;  Surgeon: Waynetta Sandy, MD;  Location: [facility] OR;  Service: Vascular;  Laterality: N/A;  . arm surgery     result of staph infection  . BACK SURGERY    . CORONARY ANGIOPLASTY    .  ESOPHAGOGASTRODUODENOSCOPY  2004    ?  . esophagus wrapped    . EYE SURGERY Bilateral 03-14-13   [place]  . HAND SURGERY     rel to staph  . JOINT REPLACEMENT     bil hips  . JOINT REPLACEMENT Right 01-20-13   Knee  . stents  2000  . TOTAL KNEE ARTHROPLASTY Right 01/20/2013   Procedure: RIGHT TOTAL KNEE ARTHROPLASTY;  Surgeon: Kerin Salen, MD;  Location: [facility];  Service: Orthopedics;  Laterality: Right;    Short Social History:  Social History  Substance Use Topics  . Smoking status: Former Smoker    Packs/day: 3.00    Years: 50.00    Types: Cigarettes    Quit date: 03/27/1978  . Smokeless tobacco: Former Systems developer    Types: Chew  . Alcohol use No    Allergies  Allergen Reactions  . Morphine Other (See Comments)    "makes me crazy"  . Morphine And Related Nausea And Vomiting and Other (See Comments)    Hallucinations     Current Outpatient Prescriptions  Medication Sig Dispense Refill  . aspirin 81 MG tablet Take  81 mg by mouth daily.    Marland Kitchen atorvastatin (LIPITOR) 40 MG tablet Take 40 mg by mouth daily.    . calcium carbonate (OS-CAL) 600 MG TABS Take 600 mg by mouth daily.     . carboxymethylcellulose (REFRESH PLUS) 0.5 % SOLN 1 drop 3 (three) times daily as needed.    . hydroxychloroquine (PLAQUENIL) 200 MG tablet Take 200 mg by mouth 2 (two) times daily.     . metoprolol tartrate (LOPRESSOR) 25 MG tablet Take 25 mg by mouth 2 (two) times daily.    . Multiple Vitamin (MULTIVITAMIN) capsule Take 1 capsule by mouth daily.      . Multiple Vitamins-Minerals (OCUVITE PRESERVISION PO) Take 1 tablet by mouth daily.    . nitroGLYCERIN (NITROSTAT) 0.4 MG SL tablet Place 1 tablet (0.4 mg total) under the tongue every 5 (five) minutes as needed for chest pain. 25 tablet 3  . omeprazole (PRILOSEC) 20 MG capsule Take 20 mg by mouth daily.      . predniSONE (DELTASONE) 5 MG tablet Take 5 mg by mouth daily.      Marland Kitchen sulfaSALAzine (AZULFIDINE) 500 MG tablet Take 1,000 mg by mouth 2 (two)  times daily.     . Tamsulosin HCl (FLOMAX) 0.4 MG CAPS Take 0.4 mg by mouth daily.     Marland Kitchen tiZANidine (ZANAFLEX) 2 MG tablet TAKE 1 TO 2 TABLETS BY MOUTH TWICE A DAY AS NEEDED SPASM  1   No current facility-administered medications for this visit.     Review of Systems  Constitutional:  Constitutional negative. HENT: HENT negative.  Eyes: Eyes negative.  Respiratory: Respiratory negative.  Cardiovascular: Positive for irregular heartbeat.  GI: Gastrointestinal negative.  Musculoskeletal: Positive for back pain and joint pain.  Skin: Skin negative.  Neurological: Neurological negative. Hematologic: Hematologic/lymphatic negative.  Psychiatric: Psychiatric negative.        Objective:  Objective   Vitals:   08/25/16 0901 08/25/16 0905  BP: (!) 148/80 129/86  Pulse: 81 81  Resp: 16   Temp: 97.1 F (36.2 C)   SpO2: 93%   Weight: 194 lb (88 kg)   Height: 5\' 11"  (1.803 m)    Body mass index is 27.06 kg/m.  Physical Exam  Constitutional: He is oriented to person, place, and time. He appears well-developed.  HENT:  Head: Normocephalic.  Eyes: Pupils are equal, round, and reactive to light.  Cardiovascular: Normal rate.   Pulses:      Femoral pulses are 2+ on the right side, and 2+ on the left side. Pulmonary/Chest: Effort normal.  Abdominal: Soft. He exhibits no mass.  Musculoskeletal: Normal range of motion.  Neurological: He is alert and oriented to person, place, and time.  Skin: Skin is warm and dry.  Psychiatric: He has a normal mood and affect. His behavior is normal. Judgment and thought content normal.    Data: I have independently interpreted his lower extremity arterial duplexes with right 0.8 cm popliteal and left 0.7 cm popliteal.  I have also independently interpreted his endovascular AAA repair duplex which demonstrated evar with no evidence of endoleak and 50 m in greatest diameter.     Assessment/Plan:    83 year old male now 6 months out from  endovascular AAA repair. Last CT scan demonstrated endoleak which is not demonstrated on duplex today. We will get a CT angiogram in 6 months to further evaluate the aneurysm minutes at that time we are stable in size we will follow up in another year and follow  him with duplexes. We've discussed the endoleak as well as symptoms of back or abdominal pain. He does have chronic back pain as well as rheumatoid arthritis. He'll follow-up in 6 months.     Waynetta Sandy MD [facility]

## 2016-09-04 NOTE — Addendum Note (Signed)
Addended by: Lianne Cure A on: 09/04/2016 04:15 PM   Modules accepted: Orders

## 2016-10-30 IMAGING — CT CT CHEST HIGH RESOLUTION W/O CM
2 of 6 series · 8 of 36 positions shown, 10 images · non-contrast
Comparison: CT of the abdomen and pelvis 03/26/2014. No prior chest
CT.

CLINICAL DATA: 83-year-old male with shortness of breath. History
of rheumatoid arthritis. Evaluate for interstitial lung disease.

EXAM:
CT CHEST WITHOUT CONTRAST
TECHNIQUE: Multidetector CT imaging of the chest was performed following the
standard protocol without intravenous contrast. High resolution
imaging of the lungs, as well as inspiratory and expiratory imaging,
was performed.

[Series 8: hires retro entire lungs · axial · 0.75mm/px · z∈[-262,-72]mm · 5 of 29 slices shown, 7 images]
[im 5/29  mediastinal]
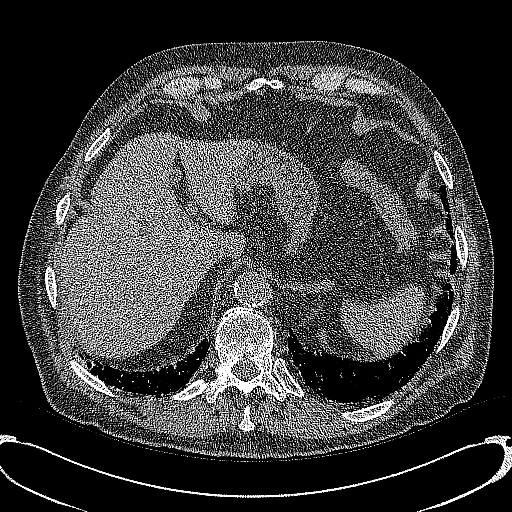
[im 5/29  lung]
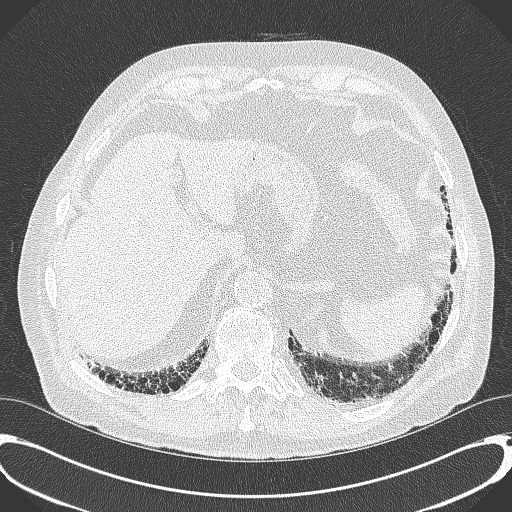
[im 10/29  lung]
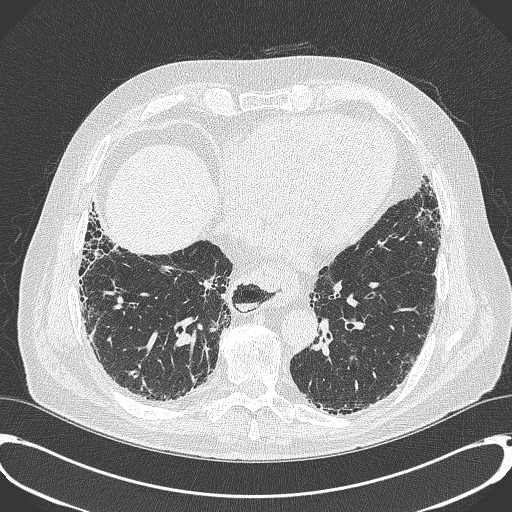
[im 15/29  lung]
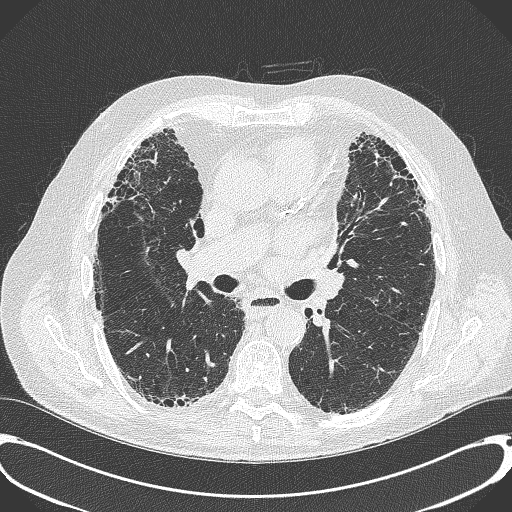
[im 19/29  lung]
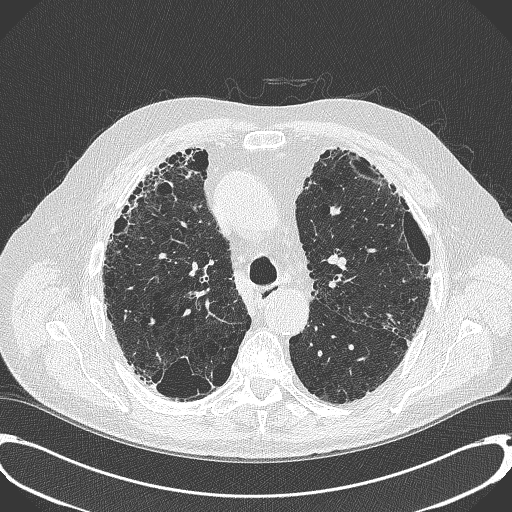
[im 24/29  mediastinal]
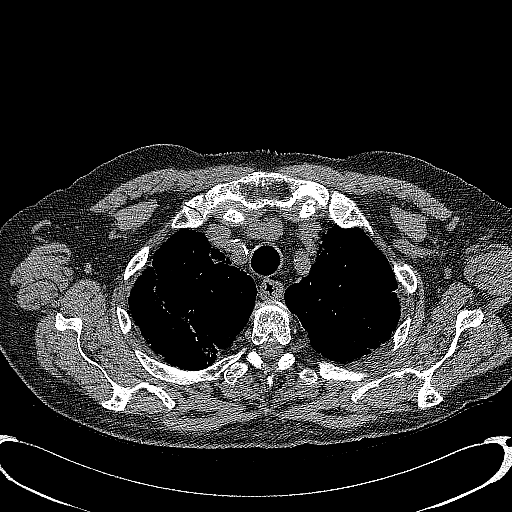
[im 24/29  lung]
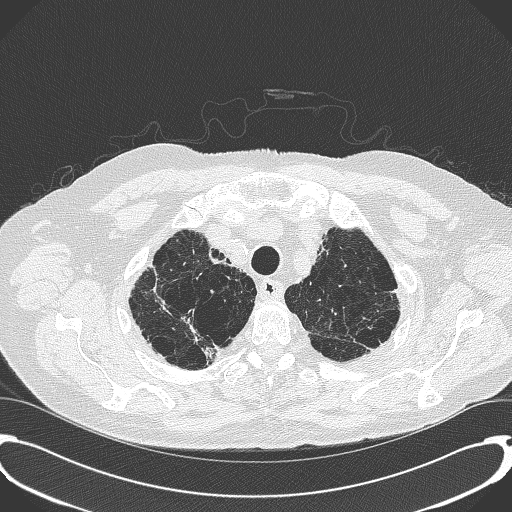

[Series 602: cor · coronal · 0.75mm/px · 3 of 130 slices shown]
[im 26/130  lung]
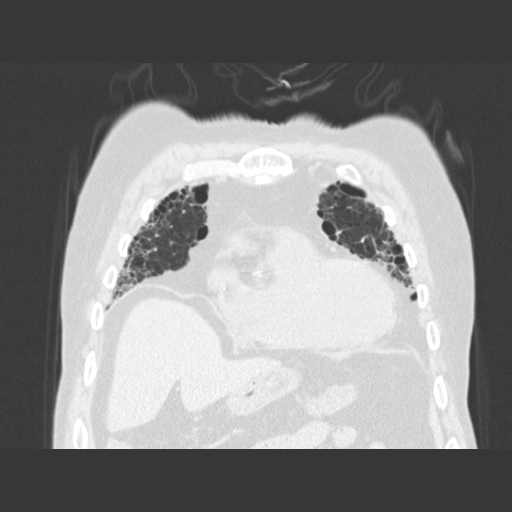
[im 52/130  lung]
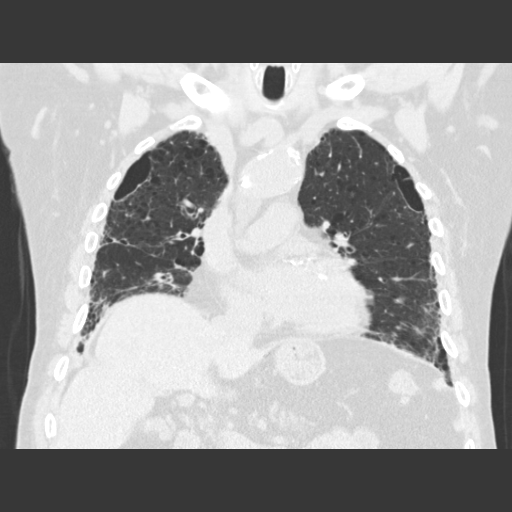
[im 78/130  lung]
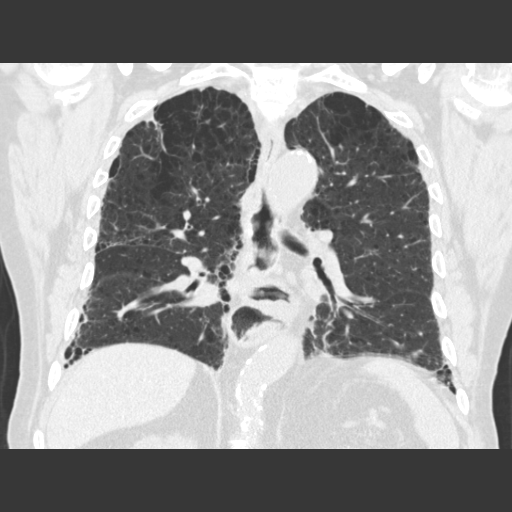

[8 of 36 positions shown; findings below may reference images not displayed]

FINDINGS: Mediastinum/Lymph Nodes: Heart size is mildly enlarged. There is no
significant pericardial fluid, thickening or pericardial
calcification. There is atherosclerosis of the thoracic aorta, the
great vessels of the mediastinum and the coronary arteries,
including calcified atherosclerotic plaque in the left main, left
anterior descending, left circumflex and right coronary arteries.
Severe calcifications of the aortic valve. No pathologically
enlarged mediastinal, hilar or axillary lymph nodes. Postoperative
changes near the gastroesophageal junction, likely from prior
fundoplication. Notably, the fundoplication wrap appears to have
herniated into the lower mediastinum.

Lungs/Pleura: Mild diffuse bronchial wall thickening with moderate
centrilobular and paraseptal emphysema. In addition, there is
widespread subpleural reticulation, mild traction bronchiectasis,
some parenchymal banding and extensive honeycombing, with the aid
well-defined craniocaudal gradient. No acute consolidative airspace
disease. No pleural effusions.

Musculoskeletal/Soft Tissues: There are no aggressive appearing
lytic or blastic lesions noted in the visualized portions of the
skeleton.

Upper Abdomen: 5 mm nonobstructive calculus in the upper pole
collecting system of the right kidney. Partially calcified
gallstones in the neck of the gallbladder. No current findings to
suggest an acute cholecystitis at this time in the visualized
portions of the abdomen. Multiple punctate calcifications in the
head of the pancreas, similar to the prior examination, presumably
sequela of prior pancreatitis.
IMPRESSION: 1. The appearance of the lungs does indicate underlying interstitial
lung disease, and the pattern is favored to reflect usual
interstitial pneumonia (UIP). The findings described above have
clearly progressed compared to remote prior study 03/26/2004 where
the lung bases were incidentally imaged on a CT of the abdomen and
pelvis.
2. In addition, there is diffuse bronchial wall thickening with
moderate centrilobular and paraseptal emphysema; imaging findings
suggesting concurrent COPD.
3. Atherosclerosis, including left main and 3 vessel coronary artery
disease. Assessment for potential risk factor modification, dietary
therapy or pharmacologic therapy may be warranted, if clinically
indicated.
4. There are extensive calcifications of the aortic valve.
Echocardiographic correlation for evaluation of potential valvular
dysfunction may be warranted if clinically indicated.
5. Postoperative changes suggestive of prior fundoplication
procedure, likely with recurrent herniation of the fundoplication
wrap.
6. Cholelithiasis without evidence to suggest acute cholecystitis at
this time.
7. 5 mm nonobstructive calculus in the upper pole collecting system
of the right kidney.

## 2016-11-08 DIAGNOSIS — J8410 Pulmonary fibrosis, unspecified: Secondary | ICD-10-CM | POA: Diagnosis not present

## 2016-11-08 DIAGNOSIS — D692 Other nonthrombocytopenic purpura: Secondary | ICD-10-CM | POA: Diagnosis not present

## 2016-11-08 DIAGNOSIS — Q253 Supravalvular aortic stenosis: Secondary | ICD-10-CM | POA: Diagnosis not present

## 2016-11-08 DIAGNOSIS — I10 Essential (primary) hypertension: Secondary | ICD-10-CM | POA: Diagnosis not present

## 2016-12-13 DIAGNOSIS — H353122 Nonexudative age-related macular degeneration, left eye, intermediate dry stage: Secondary | ICD-10-CM | POA: Diagnosis not present

## 2016-12-13 DIAGNOSIS — I10 Essential (primary) hypertension: Secondary | ICD-10-CM | POA: Diagnosis not present

## 2016-12-13 DIAGNOSIS — H40013 Open angle with borderline findings, low risk, bilateral: Secondary | ICD-10-CM | POA: Diagnosis not present

## 2016-12-13 DIAGNOSIS — H04123 Dry eye syndrome of bilateral lacrimal glands: Secondary | ICD-10-CM | POA: Diagnosis not present

## 2016-12-13 DIAGNOSIS — H18413 Arcus senilis, bilateral: Secondary | ICD-10-CM | POA: Diagnosis not present

## 2016-12-13 DIAGNOSIS — H353111 Nonexudative age-related macular degeneration, right eye, early dry stage: Secondary | ICD-10-CM | POA: Diagnosis not present

## 2016-12-13 DIAGNOSIS — H02055 Trichiasis without entropian left lower eyelid: Secondary | ICD-10-CM | POA: Diagnosis not present

## 2016-12-13 DIAGNOSIS — M069 Rheumatoid arthritis, unspecified: Secondary | ICD-10-CM | POA: Diagnosis not present

## 2016-12-13 DIAGNOSIS — H40023 Open angle with borderline findings, high risk, bilateral: Secondary | ICD-10-CM | POA: Diagnosis not present

## 2016-12-13 DIAGNOSIS — H1789 Other corneal scars and opacities: Secondary | ICD-10-CM | POA: Diagnosis not present

## 2016-12-13 DIAGNOSIS — H35033 Hypertensive retinopathy, bilateral: Secondary | ICD-10-CM | POA: Diagnosis not present

## 2016-12-13 DIAGNOSIS — Z79899 Other long term (current) drug therapy: Secondary | ICD-10-CM | POA: Diagnosis not present

## 2016-12-24 DIAGNOSIS — N400 Enlarged prostate without lower urinary tract symptoms: Secondary | ICD-10-CM | POA: Diagnosis not present

## 2016-12-24 DIAGNOSIS — I2510 Atherosclerotic heart disease of native coronary artery without angina pectoris: Secondary | ICD-10-CM | POA: Diagnosis not present

## 2016-12-24 DIAGNOSIS — I10 Essential (primary) hypertension: Secondary | ICD-10-CM | POA: Diagnosis not present

## 2016-12-24 DIAGNOSIS — M069 Rheumatoid arthritis, unspecified: Secondary | ICD-10-CM | POA: Diagnosis not present

## 2017-01-08 DIAGNOSIS — Z23 Encounter for immunization: Secondary | ICD-10-CM | POA: Diagnosis not present

## 2017-02-06 DIAGNOSIS — M2550 Pain in unspecified joint: Secondary | ICD-10-CM | POA: Diagnosis not present

## 2017-02-06 DIAGNOSIS — Z79899 Other long term (current) drug therapy: Secondary | ICD-10-CM | POA: Diagnosis not present

## 2017-02-06 DIAGNOSIS — J8410 Pulmonary fibrosis, unspecified: Secondary | ICD-10-CM | POA: Diagnosis not present

## 2017-02-06 DIAGNOSIS — Z6828 Body mass index (BMI) 28.0-28.9, adult: Secondary | ICD-10-CM | POA: Diagnosis not present

## 2017-02-06 DIAGNOSIS — M0579 Rheumatoid arthritis with rheumatoid factor of multiple sites without organ or systems involvement: Secondary | ICD-10-CM | POA: Diagnosis not present

## 2017-02-06 DIAGNOSIS — E663 Overweight: Secondary | ICD-10-CM | POA: Diagnosis not present

## 2017-02-20 DIAGNOSIS — M069 Rheumatoid arthritis, unspecified: Secondary | ICD-10-CM | POA: Diagnosis not present

## 2017-02-20 DIAGNOSIS — I10 Essential (primary) hypertension: Secondary | ICD-10-CM | POA: Diagnosis not present

## 2017-02-20 DIAGNOSIS — I2510 Atherosclerotic heart disease of native coronary artery without angina pectoris: Secondary | ICD-10-CM | POA: Diagnosis not present

## 2017-02-20 DIAGNOSIS — N400 Enlarged prostate without lower urinary tract symptoms: Secondary | ICD-10-CM | POA: Diagnosis not present

## 2017-02-23 ENCOUNTER — Ambulatory Visit (HOSPITAL_COMMUNITY): Payer: Medicare Other

## 2017-03-02 ENCOUNTER — Ambulatory Visit: Payer: Medicare Other | Admitting: Vascular Surgery

## 2017-03-22 DIAGNOSIS — I2510 Atherosclerotic heart disease of native coronary artery without angina pectoris: Secondary | ICD-10-CM | POA: Diagnosis not present

## 2017-03-22 DIAGNOSIS — M069 Rheumatoid arthritis, unspecified: Secondary | ICD-10-CM | POA: Diagnosis not present

## 2017-03-22 DIAGNOSIS — N400 Enlarged prostate without lower urinary tract symptoms: Secondary | ICD-10-CM | POA: Diagnosis not present

## 2017-03-22 DIAGNOSIS — I10 Essential (primary) hypertension: Secondary | ICD-10-CM | POA: Diagnosis not present

## 2017-04-05 ENCOUNTER — Ambulatory Visit (HOSPITAL_COMMUNITY): Admission: RE | Admit: 2017-04-05 | Payer: Medicare Other | Source: Ambulatory Visit

## 2017-04-06 ENCOUNTER — Ambulatory Visit: Payer: Medicare Other | Admitting: Vascular Surgery

## 2017-04-09 DIAGNOSIS — I2510 Atherosclerotic heart disease of native coronary artery without angina pectoris: Secondary | ICD-10-CM | POA: Diagnosis not present

## 2017-04-09 DIAGNOSIS — M069 Rheumatoid arthritis, unspecified: Secondary | ICD-10-CM | POA: Diagnosis not present

## 2017-04-09 DIAGNOSIS — I10 Essential (primary) hypertension: Secondary | ICD-10-CM | POA: Diagnosis not present

## 2017-04-09 DIAGNOSIS — N400 Enlarged prostate without lower urinary tract symptoms: Secondary | ICD-10-CM | POA: Diagnosis not present

## 2017-04-20 DIAGNOSIS — H1789 Other corneal scars and opacities: Secondary | ICD-10-CM | POA: Diagnosis not present

## 2017-04-20 DIAGNOSIS — M069 Rheumatoid arthritis, unspecified: Secondary | ICD-10-CM | POA: Diagnosis not present

## 2017-04-20 DIAGNOSIS — H18413 Arcus senilis, bilateral: Secondary | ICD-10-CM | POA: Diagnosis not present

## 2017-04-20 DIAGNOSIS — Z9849 Cataract extraction status, unspecified eye: Secondary | ICD-10-CM | POA: Diagnosis not present

## 2017-04-20 DIAGNOSIS — H11423 Conjunctival edema, bilateral: Secondary | ICD-10-CM | POA: Diagnosis not present

## 2017-04-20 DIAGNOSIS — H353131 Nonexudative age-related macular degeneration, bilateral, early dry stage: Secondary | ICD-10-CM | POA: Diagnosis not present

## 2017-04-20 DIAGNOSIS — H11412 Vascular abnormalities of conjunctiva, left eye: Secondary | ICD-10-CM | POA: Diagnosis not present

## 2017-04-20 DIAGNOSIS — H35033 Hypertensive retinopathy, bilateral: Secondary | ICD-10-CM | POA: Diagnosis not present

## 2017-04-20 DIAGNOSIS — Z961 Presence of intraocular lens: Secondary | ICD-10-CM | POA: Diagnosis not present

## 2017-04-20 DIAGNOSIS — H40013 Open angle with borderline findings, low risk, bilateral: Secondary | ICD-10-CM | POA: Diagnosis not present

## 2017-04-20 DIAGNOSIS — H04123 Dry eye syndrome of bilateral lacrimal glands: Secondary | ICD-10-CM | POA: Diagnosis not present

## 2017-04-20 DIAGNOSIS — H11021 Central pterygium of right eye: Secondary | ICD-10-CM | POA: Diagnosis not present

## 2017-04-27 ENCOUNTER — Encounter (HOSPITAL_COMMUNITY): Payer: Self-pay

## 2017-04-27 ENCOUNTER — Ambulatory Visit (HOSPITAL_COMMUNITY)
Admission: RE | Admit: 2017-04-27 | Discharge: 2017-04-27 | Disposition: A | Discharge status: Home / Self Care | Payer: Medicare Other | Source: Ambulatory Visit | Attending: Vascular Surgery | Admitting: Vascular Surgery

## 2017-04-27 DIAGNOSIS — K449 Diaphragmatic hernia without obstruction or gangrene: Secondary | ICD-10-CM | POA: Diagnosis not present

## 2017-04-27 DIAGNOSIS — K5730 Diverticulosis of large intestine without perforation or abscess without bleeding: Secondary | ICD-10-CM | POA: Diagnosis not present

## 2017-04-27 DIAGNOSIS — K8020 Calculus of gallbladder without cholecystitis without obstruction: Secondary | ICD-10-CM | POA: Diagnosis not present

## 2017-04-27 DIAGNOSIS — I714 Abdominal aortic aneurysm, without rupture, unspecified: Secondary | ICD-10-CM

## 2017-04-27 DIAGNOSIS — J8410 Pulmonary fibrosis, unspecified: Secondary | ICD-10-CM | POA: Insufficient documentation

## 2017-04-27 LAB — POCT I-STAT CREATININE: CREATININE: 0.7 mg/dL (ref 0.61–1.24)

## 2017-04-27 MED ORDER — IOPAMIDOL (ISOVUE-370) INJECTION 76%
100.0000 mL | Freq: Once | INTRAVENOUS | Status: AC | PRN
  Administered 2017-04-27: 100 mL via INTRAVENOUS

## 2017-05-04 ENCOUNTER — Ambulatory Visit (INDEPENDENT_AMBULATORY_CARE_PROVIDER_SITE_OTHER): Payer: Medicare Other | Admitting: Vascular Surgery

## 2017-05-04 ENCOUNTER — Encounter: Payer: Self-pay | Admitting: Vascular Surgery

## 2017-05-04 VITALS — BP 119/75 | HR 78 | Temp 97.3°F | Resp 20 | Ht 71.0 in | Wt 181.0 lb

## 2017-05-04 DIAGNOSIS — I714 Abdominal aortic aneurysm, without rupture, unspecified: Secondary | ICD-10-CM

## 2017-05-04 NOTE — Progress Notes (Signed)
[name], male   DOB: [birth year], 84 y.o.   MRN: [id number]  Reason for Consult: Follow-up (6 month f/u CTA abd/pel)   Referred by Lajean Manes, MD  Subjective:     HPI:  [name] is a 84 y.o. male status post endovascular aneurysm repair for expanding aneurysm that reached 5 cm back in October 2017.  At last follow-up he was noted to have stable aneurysm by duplex but did have a notable type II endoleak on his initial postoperative CT scan.  He has no new back or abdominal pain.  He has recently had a sore throat that has been his limitation is otherwise limited by walking by and his back pain but has no other leg complaints.  He is a former smoker.  He takes aspirin and a statin daily but no blood thinners.  Past Medical History:  Diagnosis Date  . AAA (abdominal aortic aneurysm) ([place])   . Anemia   . Arthritis    ra  . Atrial fibrillation ([place])   . COPD (chronic obstructive pulmonary disease) ([place]) Jan.2016   Small amount of Emphysema- Pulmonary Fibrosis ( Feb. 9, 2016 )  . Coronary atherosclerosis   . Dysrhythmia    A-Fib  . Hematuria, gross    HISTORY OF  ?  2004  . Hypertension    dr Daneen Schick  . Immune disorder ([place])   . Myocardial infarction ([place]) 2000  . Pulmonary fibrosis ([place])   . Rheumatoid arthritis ([place])   . Shortness of breath    with exertion  . Staph skin infection    Back   Family History  Problem Relation Age of Onset  . Heart disease Brother        Heart Disease before age 73  . Stroke Brother   . Heart attack Brother   . Hypertension Brother   . Diabetes Son   . Heart disease Son        Heart Disease before age 1  . Hypertension Son   . Heart attack Son   . Bleeding Disorder Father   . COPD Father        Emphysema  . Varicose Veins Sister   . COPD Sister    Past Surgical History:  Procedure Laterality Date  . ABDOMINAL AORTIC ENDOVASCULAR STENT GRAFT N/A 01/06/2016   Procedure: ABDOMINAL AORTIC ENDOVASCULAR  STENT GRAFT;  Surgeon: Waynetta Sandy, MD;  Location: [facility] OR;  Service: Vascular;  Laterality: N/A;  . arm surgery     result of staph infection  . BACK SURGERY    . CORONARY ANGIOPLASTY    . ESOPHAGOGASTRODUODENOSCOPY  2004    ?  . esophagus wrapped    . EYE SURGERY Bilateral 03-14-13   [place]  . HAND SURGERY     rel to staph  . JOINT REPLACEMENT     bil hips  . JOINT REPLACEMENT Right 01-20-13   Knee  . stents  2000  . TOTAL KNEE ARTHROPLASTY Right 01/20/2013   Procedure: RIGHT TOTAL KNEE ARTHROPLASTY;  Surgeon: Kerin Salen, MD;  Location: [facility];  Service: Orthopedics;  Laterality: Right;    Short Social History:  Social History   Tobacco Use  . Smoking status: Former Smoker    Packs/day: 3.00    Years: 50.00    Pack years: 150.00    Types: Cigarettes    Last attempt to quit: 03/27/1978    Years since quitting: 39.1  . Smokeless tobacco:  Former Systems developer    Types: Chew  Substance Use Topics  . Alcohol use: No    Alcohol/week: 0.0 oz    Allergies  Allergen Reactions  . Morphine Other (See Comments)    "makes me crazy"  . Morphine And Related Nausea And Vomiting and Other (See Comments)    Hallucinations     Current Outpatient Medications  Medication Sig Dispense Refill  . aspirin 81 MG tablet Take 81 mg by mouth daily.    Marland Kitchen atorvastatin (LIPITOR) 40 MG tablet Take 40 mg by mouth daily.    . B Complex Vitamins (VITAMIN B COMPLEX PO) Take by mouth.    . calcium carbonate (OS-CAL) 600 MG TABS Take 600 mg by mouth daily.     . carboxymethylcellulose (REFRESH PLUS) 0.5 % SOLN 1 drop 3 (three) times daily as needed.    . DULoxetine (CYMBALTA) 30 MG capsule Take 30 mg by mouth daily.    . hydroxychloroquine (PLAQUENIL) 200 MG tablet Take 200 mg by mouth 2 (two) times daily.     . metoprolol tartrate (LOPRESSOR) 25 MG tablet Take 25 mg by mouth 2 (two) times daily.    . Multiple Vitamin (MULTIVITAMIN) capsule Take 1 capsule by mouth daily.      . Multiple  Vitamins-Minerals (OCUVITE PRESERVISION PO) Take 1 tablet by mouth daily.    . nitroGLYCERIN (NITROSTAT) 0.4 MG SL tablet Place 1 tablet (0.4 mg total) under the tongue every 5 (five) minutes as needed for chest pain. 25 tablet 3  . Omega-3 Fatty Acids (FISH OIL PO) Take by mouth.    Marland Kitchen omeprazole (PRILOSEC) 20 MG capsule Take 20 mg by mouth daily.      . predniSONE (DELTASONE) 5 MG tablet Take 5 mg by mouth daily.      Marland Kitchen sulfaSALAzine (AZULFIDINE) 500 MG tablet Take 1,000 mg by mouth 2 (two) times daily.     . Tamsulosin HCl (FLOMAX) 0.4 MG CAPS Take 0.4 mg by mouth daily.     Marland Kitchen tiZANidine (ZANAFLEX) 2 MG tablet TAKE 1 TO 2 TABLETS BY MOUTH TWICE A DAY AS NEEDED SPASM  1   No current facility-administered medications for this visit.     Review of Systems  Constitutional: Positive for fatigue.  HENT: HENT negative.  Eyes: Eyes negative.  Respiratory: Positive for shortness of breath and wheezing.  Cardiovascular: Positive for dyspnea with exertion and irregular heartbeat.  GI: Gastrointestinal negative.  Musculoskeletal: Musculoskeletal negative.  Skin: Skin negative.  Neurological: Neurological negative. Hematologic: Hematologic/lymphatic negative.  Psychiatric: Psychiatric negative.        Objective:  Objective   Vitals:   05/04/17 0827  BP: 119/75  Pulse: 78  Resp: 20  Temp: (!) 97.3 F (36.3 C)  TempSrc: Oral  SpO2: 95%  Weight: 181 lb (82.1 kg)  Height: 5\' 11"  (1.803 m)   Body mass index is 25.24 kg/m.  Physical Exam  Constitutional: He is oriented to person, place, and time. He appears well-developed.  HENT:  Head: Normocephalic.  Eyes: Pupils are equal, round, and reactive to light.  Neck: Normal range of motion. Neck supple.  Cardiovascular: Normal rate.  Pulses:      Femoral pulses are 2+ on the right side, and 2+ on the left side.      Popliteal pulses are 2+ on the right side, and 2+ on the left side.  Abdominal: Soft. He exhibits no mass.    Musculoskeletal: Normal range of motion. He exhibits no edema.  Neurological:  He is alert and oriented to person, place, and time.  Skin: Skin is warm and dry.  Psychiatric: He has a normal mood and affect. His behavior is normal. Judgment and thought content normal.    Data: IMPRESSION: VASCULAR  1. Patent infrarenal aortic stent graft with persistent type 2 endoleak, stable 5.4 cm native aneurysm sac diameter.  NON-VASCULAR  1. No acute findings. 2. Hiatal hernia 3. Descending and sigmoid diverticulosis. 4. Cholelithiasis 5. Bibasilar pulmonary fibrotic changes as before.     Assessment/Plan:     84 year old male follows up from endovascular aneurysm repair with a 35-month follow-up CT scan.  This procedure was performed 16 months ago and he has done very well from it.  There is evidence of a type II endoleak from 2 lumbar arteries and his aneurysm measures 5.2 cm in CAT scan 1 year ago measured approximately 5.1 cm.  Given that this is essentially no change I would not recommend any intervention at this time.  I reviewed the CAT scan with patient and family and they understand.  We discussed the signs and symptoms of rupture which are low likelihood at this point.  I will see him in 6 months with a duplex to evaluate the size.  He is understanding that if the size has dramatically increased he would need another CT scan which we would otherwise see him in a CAT scan in another year.  He demonstrates good understanding I expect to see him in 6 months.     Waynetta Sandy MD [facility]

## 2017-05-07 ENCOUNTER — Other Ambulatory Visit: Payer: Self-pay

## 2017-05-07 DIAGNOSIS — I714 Abdominal aortic aneurysm, without rupture, unspecified: Secondary | ICD-10-CM

## 2017-05-17 ENCOUNTER — Ambulatory Visit (INDEPENDENT_AMBULATORY_CARE_PROVIDER_SITE_OTHER): Payer: Medicare Other | Admitting: Ophthalmology

## 2017-05-17 DIAGNOSIS — M069 Rheumatoid arthritis, unspecified: Secondary | ICD-10-CM

## 2017-05-17 DIAGNOSIS — H353122 Nonexudative age-related macular degeneration, left eye, intermediate dry stage: Secondary | ICD-10-CM

## 2017-05-17 DIAGNOSIS — H35033 Hypertensive retinopathy, bilateral: Secondary | ICD-10-CM | POA: Diagnosis not present

## 2017-05-17 DIAGNOSIS — H43813 Vitreous degeneration, bilateral: Secondary | ICD-10-CM | POA: Diagnosis not present

## 2017-05-17 DIAGNOSIS — I10 Essential (primary) hypertension: Secondary | ICD-10-CM

## 2017-05-18 DIAGNOSIS — F325 Major depressive disorder, single episode, in full remission: Secondary | ICD-10-CM | POA: Diagnosis not present

## 2017-05-18 DIAGNOSIS — Z0000 Encounter for general adult medical examination without abnormal findings: Secondary | ICD-10-CM | POA: Diagnosis not present

## 2017-05-18 DIAGNOSIS — E7800 Pure hypercholesterolemia, unspecified: Secondary | ICD-10-CM | POA: Diagnosis not present

## 2017-05-18 DIAGNOSIS — Q253 Supravalvular aortic stenosis: Secondary | ICD-10-CM | POA: Diagnosis not present

## 2017-05-18 DIAGNOSIS — J8410 Pulmonary fibrosis, unspecified: Secondary | ICD-10-CM | POA: Diagnosis not present

## 2017-05-18 DIAGNOSIS — M069 Rheumatoid arthritis, unspecified: Secondary | ICD-10-CM | POA: Diagnosis not present

## 2017-05-18 DIAGNOSIS — G3184 Mild cognitive impairment, so stated: Secondary | ICD-10-CM | POA: Diagnosis not present

## 2017-05-18 DIAGNOSIS — Z79899 Other long term (current) drug therapy: Secondary | ICD-10-CM | POA: Diagnosis not present

## 2017-07-20 DIAGNOSIS — F0280 Dementia in other diseases classified elsewhere without behavioral disturbance: Secondary | ICD-10-CM | POA: Diagnosis not present

## 2017-07-20 DIAGNOSIS — G301 Alzheimer's disease with late onset: Secondary | ICD-10-CM | POA: Diagnosis not present

## 2017-07-20 DIAGNOSIS — I10 Essential (primary) hypertension: Secondary | ICD-10-CM | POA: Diagnosis not present

## 2017-08-06 DIAGNOSIS — Z6828 Body mass index (BMI) 28.0-28.9, adult: Secondary | ICD-10-CM | POA: Diagnosis not present

## 2017-08-06 DIAGNOSIS — Z79899 Other long term (current) drug therapy: Secondary | ICD-10-CM | POA: Diagnosis not present

## 2017-08-06 DIAGNOSIS — M2550 Pain in unspecified joint: Secondary | ICD-10-CM | POA: Diagnosis not present

## 2017-08-06 DIAGNOSIS — J8410 Pulmonary fibrosis, unspecified: Secondary | ICD-10-CM | POA: Diagnosis not present

## 2017-08-06 DIAGNOSIS — E663 Overweight: Secondary | ICD-10-CM | POA: Diagnosis not present

## 2017-08-06 DIAGNOSIS — M0579 Rheumatoid arthritis with rheumatoid factor of multiple sites without organ or systems involvement: Secondary | ICD-10-CM | POA: Diagnosis not present

## 2017-09-15 ENCOUNTER — Other Ambulatory Visit: Payer: Self-pay

## 2017-09-15 ENCOUNTER — Emergency Department (HOSPITAL_BASED_OUTPATIENT_CLINIC_OR_DEPARTMENT_OTHER)
Admission: EM | Admit: 2017-09-15 | Discharge: 2017-09-15 | Disposition: A | Discharge status: Home / Self Care | Payer: Medicare Other | Attending: Emergency Medicine | Admitting: Emergency Medicine

## 2017-09-15 ENCOUNTER — Encounter (HOSPITAL_BASED_OUTPATIENT_CLINIC_OR_DEPARTMENT_OTHER): Payer: Self-pay | Admitting: Emergency Medicine

## 2017-09-15 DIAGNOSIS — R109 Unspecified abdominal pain: Secondary | ICD-10-CM | POA: Diagnosis not present

## 2017-09-15 DIAGNOSIS — Z5321 Procedure and treatment not carried out due to patient leaving prior to being seen by health care provider: Secondary | ICD-10-CM | POA: Diagnosis not present

## 2017-09-15 DIAGNOSIS — R1032 Left lower quadrant pain: Secondary | ICD-10-CM | POA: Diagnosis not present

## 2017-09-15 NOTE — ED Triage Notes (Signed)
Patient was at the [facility] walk in clinic for pain to her left sided abdominal pain x 24 hours. Patient sent her for work up the pain.

## 2017-09-19 DIAGNOSIS — F0280 Dementia in other diseases classified elsewhere without behavioral disturbance: Secondary | ICD-10-CM | POA: Diagnosis not present

## 2017-09-19 DIAGNOSIS — R1032 Left lower quadrant pain: Secondary | ICD-10-CM | POA: Diagnosis not present

## 2017-09-19 DIAGNOSIS — I10 Essential (primary) hypertension: Secondary | ICD-10-CM | POA: Diagnosis not present

## 2017-09-19 DIAGNOSIS — G301 Alzheimer's disease with late onset: Secondary | ICD-10-CM | POA: Diagnosis not present

## 2017-10-01 DIAGNOSIS — G43A0 Cyclical vomiting, not intractable: Secondary | ICD-10-CM | POA: Diagnosis not present

## 2017-10-01 DIAGNOSIS — R42 Dizziness and giddiness: Secondary | ICD-10-CM | POA: Diagnosis not present

## 2017-10-01 DIAGNOSIS — R197 Diarrhea, unspecified: Secondary | ICD-10-CM | POA: Diagnosis not present

## 2017-10-01 DIAGNOSIS — R7309 Other abnormal glucose: Secondary | ICD-10-CM | POA: Diagnosis not present

## 2017-10-01 DIAGNOSIS — I25118 Atherosclerotic heart disease of native coronary artery with other forms of angina pectoris: Secondary | ICD-10-CM | POA: Diagnosis not present

## 2017-11-02 ENCOUNTER — Encounter: Payer: Self-pay | Admitting: Vascular Surgery

## 2017-11-02 ENCOUNTER — Ambulatory Visit (INDEPENDENT_AMBULATORY_CARE_PROVIDER_SITE_OTHER): Payer: Medicare Other | Admitting: Vascular Surgery

## 2017-11-02 ENCOUNTER — Other Ambulatory Visit: Payer: Self-pay

## 2017-11-02 ENCOUNTER — Ambulatory Visit (HOSPITAL_COMMUNITY)
Admission: RE | Admit: 2017-11-02 | Discharge: 2017-11-02 | Disposition: A | Discharge status: Home / Self Care | Payer: Medicare Other | Source: Ambulatory Visit | Attending: Vascular Surgery | Admitting: Vascular Surgery

## 2017-11-02 VITALS — BP 130/82 | HR 66 | Temp 97.4°F | Resp 16 | Ht 71.0 in | Wt 179.0 lb

## 2017-11-02 DIAGNOSIS — I714 Abdominal aortic aneurysm, without rupture, unspecified: Secondary | ICD-10-CM

## 2017-11-02 NOTE — Progress Notes (Signed)
[name], male   DOB: 1931-01-13, 84 y.o.   MRN: [id number]  Reason for Consult: Aneurysm (6 mth f/u with [facility].)   Referred by [name] Manes, MD  Subjective:     HPI:  [name] is a 84 y.o. male presents for follow-up of endovascular aneurysm repair in August 2017.  He has had a stable type II endoleak since that time.  He continues to be very active has been cutting grass all summer.  He takes aspirin and a statin daily.  He does not take any blood thinners.  He is a former smoker having quit many years ago.  He presents today with evaluation via ultrasound.  Past Medical History:  Diagnosis Date  . AAA (abdominal aortic aneurysm) ([place])   . Anemia   . Arthritis    ra  . Atrial fibrillation ([place])   . COPD (chronic obstructive pulmonary disease) ([place]) Jan.2016   Small amount of Emphysema- Pulmonary Fibrosis ( Feb. 9, 2016 )  . Coronary atherosclerosis   . Dysrhythmia    A-Fib  . Hematuria, gross    HISTORY OF  ?  2004  . Hypertension    dr [name]  . Immune disorder ([place])   . Myocardial infarction ([place]) 2000  . Pulmonary fibrosis ([place])   . Rheumatoid arthritis ([place])   . Shortness of breath    with exertion  . Staph skin infection    Back   Family History  Problem Relation Age of Onset  . Heart disease Brother        Heart Disease before age 64  . Stroke Brother   . Heart attack Brother   . Hypertension Brother   . Diabetes Son   . Heart disease Son        Heart Disease before age 45  . Hypertension Son   . Heart attack Son   . Bleeding Disorder Father   . COPD Father        Emphysema  . Varicose Veins Sister   . COPD Sister    Past Surgical History:  Procedure Laterality Date  . ABDOMINAL AORTIC ENDOVASCULAR STENT GRAFT N/A 01/06/2016   Procedure: ABDOMINAL AORTIC ENDOVASCULAR STENT GRAFT;  Surgeon: [name] Sandy, MD;  Location: [facility] OR;  Service: Vascular;  Laterality: N/A;  . arm surgery     result of staph  infection  . BACK SURGERY    . CORONARY ANGIOPLASTY    . ESOPHAGOGASTRODUODENOSCOPY  2004    ?  . esophagus wrapped    . EYE SURGERY Bilateral 03-14-13   [place]  . HAND SURGERY     rel to staph  . JOINT REPLACEMENT     bil hips  . JOINT REPLACEMENT Right 01-20-13   Knee  . stents  2000  . TOTAL KNEE ARTHROPLASTY Right 01/20/2013   Procedure: RIGHT TOTAL KNEE ARTHROPLASTY;  Surgeon: [name] Salen, MD;  Location: [facility];  Service: Orthopedics;  Laterality: Right;    Short Social History:  Social History   Tobacco Use  . Smoking status: Former Smoker    Packs/day: 3.00    Years: 50.00    Pack years: 150.00    Types: Cigarettes    Last attempt to quit: 03/27/1978    Years since quitting: 39.6  . Smokeless tobacco: Former Systems developer    Types: Chew  Substance Use Topics  . Alcohol use: No    Alcohol/week: 0.0 standard drinks    Allergies  Allergen  Reactions  . Morphine Other (See Comments)    "makes me crazy"  . Morphine And Related Nausea And Vomiting and Other (See Comments)    Hallucinations     Current Outpatient Medications  Medication Sig Dispense Refill  . aspirin 81 MG tablet Take 81 mg by mouth daily.    [name] atorvastatin (LIPITOR) 40 MG tablet Take 40 mg by mouth daily.    . B Complex Vitamins (VITAMIN B COMPLEX PO) Take by mouth.    . calcium carbonate (OS-CAL) 600 MG TABS Take 600 mg by mouth daily.     . carboxymethylcellulose (REFRESH PLUS) 0.5 % SOLN 1 drop 3 (three) times daily as needed.    . donepezil (ARICEPT) 5 MG tablet Take 5 mg by mouth at bedtime.    . DULoxetine (CYMBALTA) 30 MG capsule Take 30 mg by mouth daily.    . hydroxychloroquine (PLAQUENIL) 200 MG tablet Take 200 mg by mouth 2 (two) times daily.     . metoprolol tartrate (LOPRESSOR) 25 MG tablet Take 25 mg by mouth 2 (two) times daily.    . Multiple Vitamin (MULTIVITAMIN) capsule Take 1 capsule by mouth daily.      . Multiple Vitamins-Minerals (OCUVITE PRESERVISION PO) Take 1 tablet by mouth  daily.    . nitroGLYCERIN (NITROSTAT) 0.4 MG SL tablet Place 1 tablet (0.4 mg total) under the tongue every 5 (five) minutes as needed for chest pain. 25 tablet 3  . Omega-3 Fatty Acids (FISH OIL PO) Take by mouth.    [name] omeprazole (PRILOSEC) 20 MG capsule Take 20 mg by mouth daily.      . predniSONE (DELTASONE) 5 MG tablet Take 5 mg by mouth daily.      [name] sulfaSALAzine (AZULFIDINE) 500 MG tablet Take 1,000 mg by mouth 2 (two) times daily.     . Tamsulosin HCl (FLOMAX) 0.4 MG CAPS Take 0.4 mg by mouth daily.     [name] tiZANidine (ZANAFLEX) 2 MG tablet TAKE 1 TO 2 TABLETS BY MOUTH TWICE A DAY AS NEEDED SPASM  1   No current facility-administered medications for this visit.     Review of Systems  Constitutional:  Constitutional negative. HENT: HENT negative.  Eyes: Eyes negative.  Respiratory: Positive for shortness of breath.  Cardiovascular: Positive for dyspnea with exertion.  GI: Gastrointestinal negative.  Musculoskeletal: Musculoskeletal negative.  Skin: Skin negative.  Neurological: Neurological negative. Hematologic: Hematologic/lymphatic negative.  Psychiatric: Psychiatric negative.        Objective:  Objective   Vitals:   11/02/17 0853  BP: 130/82  Pulse: 66  Resp: 16  Temp: (!) 97.4 F (36.3 C)  TempSrc: Oral  SpO2: 97%  Weight: 179 lb (81.2 kg)  Height: 5\' 11"  (1.803 m)   Body mass index is 24.97 kg/m.  Physical Exam  Constitutional: He appears well-developed.  HENT:  Head: Normocephalic.  Eyes: Pupils are equal, round, and reactive to light.  Neck: Normal range of motion.  Cardiovascular:  Pulses:      Femoral pulses are 2+ on the right side, and 2+ on the left side.      Popliteal pulses are 2+ on the right side, and 2+ on the left side.  Abdominal: Soft. He exhibits no mass.  Musculoskeletal: Normal range of motion. He exhibits no edema.  Neurological: He is alert.  Skin: Skin is warm and dry.  Psychiatric: He has a normal mood and affect. His  behavior is normal. Judgment and thought content normal.  Data: I have independently interpreted his abdominal aortic duplex which demonstrates a largest aortic diameter 5.3 cm.     Assessment/Plan:    84 year old male follows up for evaluation with ultrasound of his previous endovascular repair of aortic duplex.  When I last saw him 6 months ago he was measuring 5.2 cm and today 5.3.  I discussed with him that this is unlikely a real change and his type II endoleak is stable.  We will see him in 1 year.  I discussed with him that new back or abdominal pain should be evaluated immediately.  Other than that he should be safe and I would expect that his aneurysm size will be stable.  If it does enlarge significantly at the next visit we will get a CT scan.  He and his wife are very understanding.      [name] Sandy MD [facility]

## 2017-11-21 DIAGNOSIS — D692 Other nonthrombocytopenic purpura: Secondary | ICD-10-CM | POA: Diagnosis not present

## 2017-11-21 DIAGNOSIS — D509 Iron deficiency anemia, unspecified: Secondary | ICD-10-CM | POA: Diagnosis not present

## 2017-11-21 DIAGNOSIS — H9201 Otalgia, right ear: Secondary | ICD-10-CM | POA: Diagnosis not present

## 2017-11-21 DIAGNOSIS — K219 Gastro-esophageal reflux disease without esophagitis: Secondary | ICD-10-CM | POA: Diagnosis not present

## 2017-11-21 DIAGNOSIS — R413 Other amnesia: Secondary | ICD-10-CM | POA: Diagnosis not present

## 2017-11-21 DIAGNOSIS — R197 Diarrhea, unspecified: Secondary | ICD-10-CM | POA: Diagnosis not present

## 2017-11-21 DIAGNOSIS — J8410 Pulmonary fibrosis, unspecified: Secondary | ICD-10-CM | POA: Diagnosis not present

## 2017-11-21 DIAGNOSIS — Q253 Supravalvular aortic stenosis: Secondary | ICD-10-CM | POA: Diagnosis not present

## 2017-11-23 DIAGNOSIS — H40013 Open angle with borderline findings, low risk, bilateral: Secondary | ICD-10-CM | POA: Diagnosis not present

## 2017-11-23 DIAGNOSIS — H18413 Arcus senilis, bilateral: Secondary | ICD-10-CM | POA: Diagnosis not present

## 2017-11-23 DIAGNOSIS — H35033 Hypertensive retinopathy, bilateral: Secondary | ICD-10-CM | POA: Diagnosis not present

## 2017-11-23 DIAGNOSIS — H40003 Preglaucoma, unspecified, bilateral: Secondary | ICD-10-CM | POA: Diagnosis not present

## 2017-11-23 DIAGNOSIS — I10 Essential (primary) hypertension: Secondary | ICD-10-CM | POA: Diagnosis not present

## 2017-11-23 DIAGNOSIS — H04123 Dry eye syndrome of bilateral lacrimal glands: Secondary | ICD-10-CM | POA: Diagnosis not present

## 2017-11-23 DIAGNOSIS — H0100A Unspecified blepharitis right eye, upper and lower eyelids: Secondary | ICD-10-CM | POA: Diagnosis not present

## 2017-11-23 DIAGNOSIS — H0100B Unspecified blepharitis left eye, upper and lower eyelids: Secondary | ICD-10-CM | POA: Diagnosis not present

## 2017-11-23 DIAGNOSIS — H52223 Regular astigmatism, bilateral: Secondary | ICD-10-CM | POA: Diagnosis not present

## 2017-11-23 DIAGNOSIS — H5203 Hypermetropia, bilateral: Secondary | ICD-10-CM | POA: Diagnosis not present

## 2017-11-23 DIAGNOSIS — H353111 Nonexudative age-related macular degeneration, right eye, early dry stage: Secondary | ICD-10-CM | POA: Diagnosis not present

## 2017-11-23 DIAGNOSIS — H353122 Nonexudative age-related macular degeneration, left eye, intermediate dry stage: Secondary | ICD-10-CM | POA: Diagnosis not present

## 2017-11-27 DIAGNOSIS — H6123 Impacted cerumen, bilateral: Secondary | ICD-10-CM | POA: Diagnosis not present

## 2017-12-25 ENCOUNTER — Ambulatory Visit
Admission: RE | Admit: 2017-12-25 | Discharge: 2017-12-25 | Disposition: A | Discharge status: Home / Self Care | Payer: Medicare Other | Source: Ambulatory Visit | Attending: Geriatric Medicine | Admitting: Geriatric Medicine

## 2017-12-25 ENCOUNTER — Other Ambulatory Visit: Payer: Self-pay | Admitting: Geriatric Medicine

## 2017-12-25 DIAGNOSIS — J8410 Pulmonary fibrosis, unspecified: Secondary | ICD-10-CM | POA: Diagnosis not present

## 2017-12-25 DIAGNOSIS — K521 Toxic gastroenteritis and colitis: Secondary | ICD-10-CM | POA: Diagnosis not present

## 2017-12-25 DIAGNOSIS — R634 Abnormal weight loss: Secondary | ICD-10-CM | POA: Diagnosis not present

## 2017-12-25 DIAGNOSIS — J40 Bronchitis, not specified as acute or chronic: Secondary | ICD-10-CM

## 2017-12-25 DIAGNOSIS — J209 Acute bronchitis, unspecified: Secondary | ICD-10-CM | POA: Diagnosis not present

## 2017-12-25 DIAGNOSIS — R05 Cough: Secondary | ICD-10-CM | POA: Diagnosis not present

## 2018-01-09 NOTE — Progress Notes (Signed)
Cardiology Office Note:    Date:  01/11/2018   ID:  [name], DOB 1931-02-14, MRN [id number]  PCP:  Lajean Manes, MD  Cardiologist:  No primary care provider on file.   Referring MD: Lajean Manes, MD   Chief Complaint  Patient presents with  . Coronary Artery Disease    History of Present Illness:    [name] is a 84 y.o. male with a hx of Coronary artery disease with bare-metal stent right coronary, abdominal aortic aneurysm repair with stent graft by Dr. Servando Snare (type II endoleak), hypertension, and  interstitial lung /fibrosis disease and Paroxysmal atrial fibrillation.  No cardiac symptoms..  Some difficulty.  He has not had palpitations, orthopnea, PND, or lower.  He is being followed closely by Dr. Servando Snare for abdominal aortic aneurysm repair with stent graft.  No type II endoleak.   Past Medical History:  Diagnosis Date  . AAA (abdominal aortic aneurysm) ([place])   . Anemia   . Arthritis    ra  . Atrial fibrillation ([place])   . COPD (chronic obstructive pulmonary disease) ([place]) Jan.2016   Small amount of Emphysema- Pulmonary Fibrosis ( Feb. 9, 2016 )  . Coronary atherosclerosis   . Dysrhythmia    A-Fib  . Hematuria, gross    HISTORY OF  ?  2004  . Hypertension    dr Daneen Schick  . Immune disorder ([place])   . Myocardial infarction ([place]) 2000  . Pulmonary fibrosis ([place])   . Rheumatoid arthritis ([place])   . Shortness of breath    with exertion  . Staph skin infection    Back    Past Surgical History:  Procedure Laterality Date  . ABDOMINAL AORTIC ENDOVASCULAR STENT GRAFT N/A 01/06/2016   Procedure: ABDOMINAL AORTIC ENDOVASCULAR STENT GRAFT;  Surgeon: Waynetta Sandy, MD;  Location: [facility] OR;  Service: Vascular;  Laterality: N/A;  . arm surgery     result of staph infection  . BACK SURGERY    . CORONARY ANGIOPLASTY    . ESOPHAGOGASTRODUODENOSCOPY  2004    ?  . esophagus wrapped    . EYE SURGERY Bilateral 03-14-13   [place]  .  HAND SURGERY     rel to staph  . JOINT REPLACEMENT     bil hips  . JOINT REPLACEMENT Right 01-20-13   Knee  . stents  2000  . TOTAL KNEE ARTHROPLASTY Right 01/20/2013   Procedure: RIGHT TOTAL KNEE ARTHROPLASTY;  Surgeon: Kerin Salen, MD;  Location: [facility];  Service: Orthopedics;  Laterality: Right;    Current Medications: Current Meds  Medication Sig  . aspirin 81 MG tablet Take 81 mg by mouth daily.  Marland Kitchen atorvastatin (LIPITOR) 40 MG tablet Take 40 mg by mouth daily.  . calcium carbonate (OS-CAL) 600 MG TABS Take 600 mg by mouth daily.   . carboxymethylcellulose (REFRESH PLUS) 0.5 % SOLN 1 drop 3 (three) times daily as needed.  . donepezil (ARICEPT) 5 MG tablet Take 5 mg by mouth at bedtime.  . DULoxetine (CYMBALTA) 30 MG capsule Take 30 mg by mouth daily.  . hydroxychloroquine (PLAQUENIL) 200 MG tablet Take 200 mg by mouth 2 (two) times daily.   . metoprolol tartrate (LOPRESSOR) 25 MG tablet Take 25 mg by mouth 2 (two) times daily.  . Multiple Vitamin (MULTIVITAMIN) capsule Take 1 capsule by mouth daily.    . Multiple Vitamins-Minerals (OCUVITE PRESERVISION PO) Take 1 tablet by mouth daily.  . nitroGLYCERIN (NITROSTAT) 0.4 MG SL  tablet Place 1 tablet (0.4 mg total) under the tongue every 5 (five) minutes as needed for chest pain.  . predniSONE (DELTASONE) 5 MG tablet Take 5 mg by mouth daily.    . raNITIdine HCl (ZANTAC PO) Take 1 tablet by mouth daily.  Marland Kitchen sulfaSALAzine (AZULFIDINE) 500 MG tablet Take 1,000 mg by mouth 2 (two) times daily.   . Tamsulosin HCl (FLOMAX) 0.4 MG CAPS Take 0.4 mg by mouth daily.      Allergies:   Morphine and Morphine and related   Social History   Socioeconomic History  . Marital status: Married    Spouse name: Not on file  . Number of children: Not on file  . Years of education: Not on file  . Highest education level: Not on file  Occupational History  . Not on file  Social Needs  . Financial resource strain: Not on file  . Food insecurity:      Worry: Not on file    Inability: Not on file  . Transportation needs:    Medical: Not on file    Non-medical: Not on file  Tobacco Use  . Smoking status: Former Smoker    Packs/day: 3.00    Years: 50.00    Pack years: 150.00    Types: Cigarettes    Last attempt to quit: 03/27/1978    Years since quitting: 39.8  . Smokeless tobacco: Former Systems developer    Types: Chew  Substance and Sexual Activity  . Alcohol use: No    Alcohol/week: 0.0 standard drinks  . Drug use: No  . Sexual activity: Not on file  Lifestyle  . Physical activity:    Days per week: Not on file    Minutes per session: Not on file  . Stress: Not on file  Relationships  . Social connections:    Talks on phone: Not on file    Gets together: Not on file    Attends religious service: Not on file    Active member of club or organization: Not on file    Attends meetings of clubs or organizations: Not on file    Relationship status: Not on file  Other Topics Concern  . Not on file  Social History Narrative  . Not on file     Family History: The patient's family history includes Bleeding Disorder in his father; COPD in his father and sister; Diabetes in his son; Heart attack in his brother and son; Heart disease in his brother and son; Hypertension in his brother and son; Stroke in his brother; Varicose Veins in his sister.  ROS:   Please see the history of present illness.    Cardiac: Dyspnea with extreme physical activity.  Recent and blood in the urine.  Occasional cough.  All other systems reviewed and are negative.  EKGs/Labs/Other Studies Reviewed:    The following studies were reviewed today: No new data  EKG:  EKG is  ordered today.  The ekg ordered today demonstrates sinus rhythm with borderline first-degree AV block.  Left axis deviation.  No significant abnormalities  Recent Labs: 04/27/2017: Creatinine, Ser 0.70  Recent Lipid Panel No results found for: CHOL, TRIG, HDL, CHOLHDL, VLDL, LDLCALC,  LDLDIRECT  Physical Exam:    VS:  BP 108/70   Pulse 63   Ht 5\' 10"  (1.778 m)   Wt 172 lb 9.6 oz (78.3 kg)   BMI 24.77 kg/m     Wt Readings from Last 3 Encounters:  01/11/18 172 lb 9.6  oz (78.3 kg)  11/02/17 179 lb (81.2 kg)  09/15/17 183 lb (83 kg)     GEN:  Well nourished, well developed in no acute distress HEENT: Normal NECK: No JVD. LYMPHATICS: No lymphadenopathy CARDIAC: RRR, no murmur, no gallop, no  edema. VASCULAR: 2+ bilateral radial and carotid pulses.  No bruits. RESPIRATORY:  Clear to auscultation without rales, wheezing or rhonchi  ABDOMEN: Soft, non-tender, non-distended, No pulsatile mass, MUSCULOSKELETAL: No deformity  SKIN: Warm and dry NEUROLOGIC:  Alert and oriented x 3 PSYCHIATRIC:  Normal affect   ASSESSMENT:    1. Coronary artery disease involving native coronary artery of native heart without angina pectoris   2. Mixed hyperlipidemia   3. Paroxysmal atrial fibrillation (HCC)   4. Essential hypertension   5. Abdominal aortic aneurysm without rupture (HCC)    PLAN:    In order of problems listed above:  1. Remote history of bare-metal stent.  No ischemic symptoms.  At his age and with his comorbidities, we do not need any significant ischemic evaluation 2. LDL target should be less than 70. 3. No clinical episodes of atrial fibrillation.  Currently in sinus rhythm. 4. BP target 130/80 or less.  He is on beta-blocker therapy due to known abdominal aortic aneurysm. 5. Stent graft being followed by Dr. Corinna Gab  Clinical follow-up in 1 year with APP.  Follow-up with me in 2 years.  Call sooner if problems.    Medication Adjustments/Labs and Tests Ordered: Current medicines are reviewed at length with the patient today.  Concerns regarding medicines are outlined above.  Orders Placed This Encounter  Procedures  . EKG 12-Lead   No orders of the defined types were placed in this encounter.   Patient Instructions  Medication Instructions:    Your physician recommends that you continue on your current medications as directed. Please refer to the Current Medication list given to you today.  If you need a refill on your cardiac medications before your next appointment, please call your pharmacy.   Lab work: None If you have labs (blood work) drawn today and your tests are completely normal, you will receive your results only by: Marland Kitchen MyChart Message (if you have MyChart) OR . A paper copy in the mail If you have any lab test that is abnormal or we need to change your treatment, we will call you to review the results.  Testing/Procedures: None  Follow-Up: At [facility], you and your health needs are our priority.  As part of our continuing mission to provide you with exceptional heart care, we have created designated Provider Care Teams.  These Care Teams include your primary Cardiologist (physician) and Advanced Practice Providers (APPs -  Physician Assistants and Nurse Practitioners) who all work together to provide you with the care you need, when you need it. You will need a follow up appointment in 12 months.  Please call our office 2 months in advance to schedule this appointment.  You will see one of the following Advanced Practice Providers on your designated Care Team:   Truitt Merle, NP Cecilie Kicks, NP . Kathyrn Drown, NP  Your physician wants you to follow-up in: 2 years with Dr. Tamala Julian.  You will receive a reminder letter in the mail two months in advance. If you don't receive a letter, please call our office to schedule the follow-up appointment.   Any Other Special Instructions Will Be Listed Below (If Applicable).       Signed, Belva Crome  III, MD  01/11/2018 10:31 AM    [facility]

## 2018-01-11 ENCOUNTER — Ambulatory Visit (INDEPENDENT_AMBULATORY_CARE_PROVIDER_SITE_OTHER): Payer: Medicare Other | Admitting: Interventional Cardiology

## 2018-01-11 ENCOUNTER — Encounter: Payer: Self-pay | Admitting: Interventional Cardiology

## 2018-01-11 VITALS — BP 108/70 | HR 63 | Ht 70.0 in | Wt 172.6 lb

## 2018-01-11 DIAGNOSIS — I714 Abdominal aortic aneurysm, without rupture, unspecified: Secondary | ICD-10-CM

## 2018-01-11 DIAGNOSIS — I10 Essential (primary) hypertension: Secondary | ICD-10-CM

## 2018-01-11 DIAGNOSIS — I480 Paroxysmal atrial fibrillation: Secondary | ICD-10-CM

## 2018-01-11 DIAGNOSIS — I2510 Atherosclerotic heart disease of native coronary artery without angina pectoris: Secondary | ICD-10-CM

## 2018-01-11 DIAGNOSIS — E782 Mixed hyperlipidemia: Secondary | ICD-10-CM

## 2018-01-11 NOTE — Patient Instructions (Signed)
Medication Instructions:  Your physician recommends that you continue on your current medications as directed. Please refer to the Current Medication list given to you today.  If you need a refill on your cardiac medications before your next appointment, please call your pharmacy.   Lab work: None If you have labs (blood work) drawn today and your tests are completely normal, you will receive your results only by: Marland Kitchen MyChart Message (if you have MyChart) OR . A paper copy in the mail If you have any lab test that is abnormal or we need to change your treatment, we will call you to review the results.  Testing/Procedures: None  Follow-Up: At [facility], you and your health needs are our priority.  As part of our continuing mission to provide you with exceptional heart care, we have created designated Provider Care Teams.  These Care Teams include your primary Cardiologist (physician) and Advanced Practice Providers (APPs -  Physician Assistants and Nurse Practitioners) who all work together to provide you with the care you need, when you need it. You will need a follow up appointment in 12 months.  Please call our office 2 months in advance to schedule this appointment.  You will see one of the following Advanced Practice Providers on your designated Care Team:   Truitt Merle, NP Cecilie Kicks, NP . Kathyrn Drown, NP  Your physician wants you to follow-up in: 2 years with Dr. Tamala Julian.  You will receive a reminder letter in the mail two months in advance. If you don't receive a letter, please call our office to schedule the follow-up appointment.   Any Other Special Instructions Will Be Listed Below (If Applicable).

## 2018-01-23 DIAGNOSIS — D509 Iron deficiency anemia, unspecified: Secondary | ICD-10-CM | POA: Diagnosis not present

## 2018-01-23 DIAGNOSIS — I10 Essential (primary) hypertension: Secondary | ICD-10-CM | POA: Diagnosis not present

## 2018-01-23 DIAGNOSIS — J8410 Pulmonary fibrosis, unspecified: Secondary | ICD-10-CM | POA: Diagnosis not present

## 2018-01-23 DIAGNOSIS — Z23 Encounter for immunization: Secondary | ICD-10-CM | POA: Diagnosis not present

## 2018-01-23 DIAGNOSIS — Q253 Supravalvular aortic stenosis: Secondary | ICD-10-CM | POA: Diagnosis not present

## 2018-01-23 DIAGNOSIS — Z79899 Other long term (current) drug therapy: Secondary | ICD-10-CM | POA: Diagnosis not present

## 2018-02-06 DIAGNOSIS — M2550 Pain in unspecified joint: Secondary | ICD-10-CM | POA: Diagnosis not present

## 2018-02-06 DIAGNOSIS — M0579 Rheumatoid arthritis with rheumatoid factor of multiple sites without organ or systems involvement: Secondary | ICD-10-CM | POA: Diagnosis not present

## 2018-02-06 DIAGNOSIS — J8410 Pulmonary fibrosis, unspecified: Secondary | ICD-10-CM | POA: Diagnosis not present

## 2018-02-06 DIAGNOSIS — E663 Overweight: Secondary | ICD-10-CM | POA: Diagnosis not present

## 2018-02-06 DIAGNOSIS — Z6826 Body mass index (BMI) 26.0-26.9, adult: Secondary | ICD-10-CM | POA: Diagnosis not present

## 2018-02-06 DIAGNOSIS — Z79899 Other long term (current) drug therapy: Secondary | ICD-10-CM | POA: Diagnosis not present

## 2018-02-06 DIAGNOSIS — M545 Low back pain: Secondary | ICD-10-CM | POA: Diagnosis not present

## 2018-05-22 ENCOUNTER — Encounter (INDEPENDENT_AMBULATORY_CARE_PROVIDER_SITE_OTHER): Payer: Medicare Other | Admitting: Ophthalmology

## 2018-05-22 DIAGNOSIS — H353122 Nonexudative age-related macular degeneration, left eye, intermediate dry stage: Secondary | ICD-10-CM | POA: Diagnosis not present

## 2018-05-22 DIAGNOSIS — H43813 Vitreous degeneration, bilateral: Secondary | ICD-10-CM

## 2018-05-22 DIAGNOSIS — H35033 Hypertensive retinopathy, bilateral: Secondary | ICD-10-CM | POA: Diagnosis not present

## 2018-05-22 DIAGNOSIS — I10 Essential (primary) hypertension: Secondary | ICD-10-CM | POA: Diagnosis not present

## 2018-05-22 DIAGNOSIS — M069 Rheumatoid arthritis, unspecified: Secondary | ICD-10-CM

## 2019-02-23 NOTE — Progress Notes (Signed)
Cardiology Office Note:    Date:  02/24/2019   ID:  [name], DOB 1930-07-20, MRN [id number]  PCP:  Lajean Manes, MD  Cardiologist:  Sinclair Grooms, MD   Referring MD: Lajean Manes, MD   Chief Complaint  Patient presents with  . Coronary Artery Disease    History of Present Illness:    [name] is a 85 y.o. male with a hx of coronary artery disease with bare-metal stent right coronary, abdominal aortic aneurysm repair with stent graft by Dr. Servando Snare (type II endoleak), hypertension, and  interstitial lung/fibrosisdisease and paroxysmal atrial fibrillation.  No angina or orthopnea. Denies chest pain and syncope. Has episodes of fallingi. Daughter confirms related to balance and weakness. Will not use a cain or walker.  No NTG use. No claudication. No medication side effects.  Past Medical History:  Diagnosis Date  . AAA (abdominal aortic aneurysm) ([name])   . Anemia   . Arthritis    ra  . Atrial fibrillation ([name])   . COPD (chronic obstructive pulmonary disease) ([place]) Jan.2016   Small amount of Emphysema- Pulmonary Fibrosis ( Feb. 9, 2016 )  . Coronary atherosclerosis   . Dysrhythmia    A-Fib  . Hematuria, gross    HISTORY OF  ?  2004  . Hypertension    dr Daneen Schick  . Immune disorder ([name])   . Myocardial infarction ([name]) 2000  . Pulmonary fibrosis ([name])   . Rheumatoid arthritis ([name])   . Shortness of breath    with exertion  . Staph skin infection    Back    Past Surgical History:  Procedure Laterality Date  . ABDOMINAL AORTIC ENDOVASCULAR STENT GRAFT N/A 01/06/2016   Procedure: ABDOMINAL AORTIC ENDOVASCULAR STENT GRAFT;  Surgeon: Waynetta Sandy, MD;  Location: [facility] OR;  Service: Vascular;  Laterality: N/A;  . arm surgery     result of staph infection  . BACK SURGERY    . CORONARY ANGIOPLASTY    . ESOPHAGOGASTRODUODENOSCOPY  2004    ?  . esophagus wrapped    . EYE SURGERY Bilateral 03-14-13   [place]  . HAND SURGERY      rel to staph  . JOINT REPLACEMENT     bil hips  . JOINT REPLACEMENT Right 01-20-13   Knee  . stents  2000  . TOTAL KNEE ARTHROPLASTY Right 01/20/2013   Procedure: RIGHT TOTAL KNEE ARTHROPLASTY;  Surgeon: Kerin Salen, MD;  Location: [facility];  Service: Orthopedics;  Laterality: Right;    Current Medications: Current Meds  Medication Sig  . aspirin 81 MG tablet Take 81 mg by mouth daily.  Marland Kitchen atorvastatin (LIPITOR) 40 MG tablet Take 40 mg by mouth daily.  . calcium carbonate (OS-CAL) 600 MG TABS Take 600 mg by mouth daily.   . carboxymethylcellulose (REFRESH PLUS) 0.5 % SOLN 1 drop 3 (three) times daily as needed.  . donepezil (ARICEPT) 5 MG tablet Take 5 mg by mouth at bedtime.  . DULoxetine (CYMBALTA) 30 MG capsule Take 30 mg by mouth daily.  . famotidine (PEPCID) 40 MG tablet Take 40 mg by mouth daily.  . ferrous sulfate 325 (65 FE) MG tablet Take 325 mg by mouth daily with breakfast.  . hydroxychloroquine (PLAQUENIL) 200 MG tablet Take 200 mg by mouth 2 (two) times daily.   . metoprolol tartrate (LOPRESSOR) 25 MG tablet Take 25 mg by mouth 2 (two) times daily.  . Multiple Vitamin (MULTIVITAMIN) capsule Take 1 capsule by  mouth daily.    . Multiple Vitamins-Minerals (OCUVITE PRESERVISION PO) Take 1 tablet by mouth daily.  . nitroGLYCERIN (NITROSTAT) 0.4 MG SL tablet Place 1 tablet (0.4 mg total) under the tongue every 5 (five) minutes as needed for chest pain.  . predniSONE (DELTASONE) 5 MG tablet Take 5 mg by mouth daily.    Marland Kitchen sulfaSALAzine (AZULFIDINE) 500 MG tablet Take 1,000 mg by mouth 2 (two) times daily.   . Tamsulosin HCl (FLOMAX) 0.4 MG CAPS Take 0.4 mg by mouth daily.      Allergies:   Morphine and Morphine and related   Social History   Socioeconomic History  . Marital status: Married    Spouse name: Not on file  . Number of children: Not on file  . Years of education: Not on file  . Highest education level: Not on file  Occupational History  . Not on file   Social Needs  . Financial resource strain: Not on file  . Food insecurity    Worry: Not on file    Inability: Not on file  . Transportation needs    Medical: Not on file    Non-medical: Not on file  Tobacco Use  . Smoking status: Former Smoker    Packs/day: 3.00    Years: 50.00    Pack years: 150.00    Types: Cigarettes    Quit date: 03/27/1978    Years since quitting: 40.9  . Smokeless tobacco: Former Systems developer    Types: Chew  Substance and Sexual Activity  . Alcohol use: No    Alcohol/week: 0.0 standard drinks  . Drug use: No  . Sexual activity: Not on file  Lifestyle  . Physical activity    Days per week: Not on file    Minutes per session: Not on file  . Stress: Not on file  Relationships  . Social Herbalist on phone: Not on file    Gets together: Not on file    Attends religious service: Not on file    Active member of club or organization: Not on file    Attends meetings of clubs or organizations: Not on file    Relationship status: Not on file  Other Topics Concern  . Not on file  Social History Narrative  . Not on file     Family History: The patient's family history includes Bleeding Disorder in his father; COPD in his father and sister; Diabetes in his son; Heart attack in his brother and son; Heart disease in his brother and son; Hypertension in his brother and son; Stroke in his brother; Varicose Veins in his sister.  ROS:   Please see the history of present illness.    Back and leg pain. Difficulty with balance. All other systems reviewed and are negative.  EKGs/Labs/Other Studies Reviewed:    The following studies were reviewed today: No new data.  EKG:  EKG NSR with old IMI and ventricular quadrigeminy. When compared to prior, the HR is faster and PVC's are new.  Recent Labs: No results found for requested labs within last 8760 hours.  Recent Lipid Panel No results found for: CHOL, TRIG, HDL, CHOLHDL, VLDL, LDLCALC, LDLDIRECT  Physical  Exam:    VS:  BP 122/72   Pulse 97   Ht 5\' 10"  (1.778 m)   Wt 176 lb 12.8 oz (80.2 kg)   SpO2 92%   BMI 25.37 kg/m     Wt Readings from Last 3 Encounters:  02/24/19  176 lb 12.8 oz (80.2 kg)  01/11/18 172 lb 9.6 oz (78.3 kg)  11/02/17 179 lb (81.2 kg)     GEN: Elderly and frail. No acute distress HEENT: Normal NECK: No JVD. LYMPHATICS: No lymphadenopathy CARDIAC: Irregular RR without murmur, gallop, or edema. VASCULAR:  Normal Pulses. No bruits. RESPIRATORY:  Clear to auscultation without rales, wheezing or rhonchi  ABDOMEN: Soft, non-tender, non-distended, No pulsatile mass, MUSCULOSKELETAL: No deformity  SKIN: Warm and dry NEUROLOGIC:  Alert and oriented x 3 PSYCHIATRIC:  Normal affect   ASSESSMENT:    1. Coronary artery disease involving native coronary artery of native heart without angina pectoris   2. Mixed hyperlipidemia   3. Paroxysmal atrial fibrillation (HCC)   4. Essential hypertension   5. Abdominal aortic aneurysm without rupture ([place])   6. Educated about COVID-19 virus infection    PLAN:    In order of problems listed above:  1. Stable and a brief discussion of secondary prevention was entertained. 2. Target LDL less than 70 is goal. 3. No recurrence of atrial fibrillation that we are aware of. 4. Excellent blood pressure control. 5. Follows with Dr. Donzetta Matters. 6. 3W's is understood and practiced to avoid COVID-19 infection although the patient's wearing a mask.  He was told that the mass should be worn over his nose and mouth.  I encourage more aerobic activity but with the safety of a walker.  He will consider this.  States that he does not really believe he needs a walker despite the fact that he is falling.   Medication Adjustments/Labs and Tests Ordered: Current medicines are reviewed at length with the patient today.  Concerns regarding medicines are outlined above.  Orders Placed This Encounter  Procedures  . EKG 12-Lead   No orders of the  defined types were placed in this encounter.   There are no Patient Instructions on file for this visit.   Signed, Sinclair Grooms, MD  02/24/2019 4:49 PM    [place]

## 2019-02-24 ENCOUNTER — Encounter

## 2019-02-24 ENCOUNTER — Ambulatory Visit (INDEPENDENT_AMBULATORY_CARE_PROVIDER_SITE_OTHER): Payer: PRIVATE HEALTH INSURANCE | Admitting: Interventional Cardiology

## 2019-02-24 ENCOUNTER — Other Ambulatory Visit: Payer: Self-pay

## 2019-02-24 ENCOUNTER — Encounter: Payer: Self-pay | Admitting: Interventional Cardiology

## 2019-02-24 VITALS — BP 122/72 | HR 97 | Ht 70.0 in | Wt 176.8 lb

## 2019-02-24 DIAGNOSIS — E782 Mixed hyperlipidemia: Secondary | ICD-10-CM

## 2019-02-24 DIAGNOSIS — I2510 Atherosclerotic heart disease of native coronary artery without angina pectoris: Secondary | ICD-10-CM

## 2019-02-24 DIAGNOSIS — I480 Paroxysmal atrial fibrillation: Secondary | ICD-10-CM | POA: Diagnosis not present

## 2019-02-24 DIAGNOSIS — I714 Abdominal aortic aneurysm, without rupture, unspecified: Secondary | ICD-10-CM

## 2019-02-24 DIAGNOSIS — Z7189 Other specified counseling: Secondary | ICD-10-CM

## 2019-02-24 DIAGNOSIS — I10 Essential (primary) hypertension: Secondary | ICD-10-CM

## 2019-02-24 NOTE — Patient Instructions (Signed)

## 2019-05-26 ENCOUNTER — Encounter (INDEPENDENT_AMBULATORY_CARE_PROVIDER_SITE_OTHER): Payer: Medicare Other | Admitting: Ophthalmology

## 2019-07-02 ENCOUNTER — Encounter (INDEPENDENT_AMBULATORY_CARE_PROVIDER_SITE_OTHER): Payer: Medicare Other | Admitting: Ophthalmology

## 2019-08-11 ENCOUNTER — Other Ambulatory Visit: Payer: Self-pay

## 2019-08-11 ENCOUNTER — Encounter (INDEPENDENT_AMBULATORY_CARE_PROVIDER_SITE_OTHER): Payer: Medicare Other | Admitting: Ophthalmology

## 2019-08-11 DIAGNOSIS — M069 Rheumatoid arthritis, unspecified: Secondary | ICD-10-CM | POA: Diagnosis not present

## 2019-08-11 DIAGNOSIS — I10 Essential (primary) hypertension: Secondary | ICD-10-CM | POA: Diagnosis not present

## 2019-08-11 DIAGNOSIS — H35033 Hypertensive retinopathy, bilateral: Secondary | ICD-10-CM | POA: Diagnosis not present

## 2019-08-11 DIAGNOSIS — H353122 Nonexudative age-related macular degeneration, left eye, intermediate dry stage: Secondary | ICD-10-CM | POA: Diagnosis not present

## 2019-08-11 DIAGNOSIS — H43813 Vitreous degeneration, bilateral: Secondary | ICD-10-CM

## 2020-02-29 NOTE — Progress Notes (Signed)
Cardiology Office Note   Date:  03/02/2020   ID:  [name], DOB 1931-01-08, MRN [id number]  PCP:  Lajean Manes, MD  Cardiologist:  Dr. Tamala Julian    Chief Complaint  Patient presents with  . Coronary Artery Disease      History of Present Illness: [name] is a 86 y.o. male who presents for CAD.  Last seen by Dr. Tamala Julian 02/24/19  hx of coronary artery disease with bare-metal stent right coronary, abdominal aortic aneurysmrepair with stent graft by Dr. Arlana Hove II endoleak), hypertension,andinterstitial lung/fibrosisdiseaseand paroxysmal atrial fibrillation.  Has episodes of falling . Daughter confirms related to balance and weakness. Will not use a cain or walker.  Today increased SOB, per his daughter.  Some and maybe most related t ohis pulmonary fibrosis.  No recent falls. She tells me pulmonary wanted him on home 02 several years ago.  Pt did not wish to have. Today sp02 in room at rest 100%.  Dr Felipa Eth is following. No chest pain.  No lightheadedness.   Past Medical History:  Diagnosis Date  . AAA (abdominal aortic aneurysm) ([place])   . Anemia   . Arthritis    ra  . Atrial fibrillation ([place])   . COPD (chronic obstructive pulmonary disease) ([place]) Jan.2016   Small amount of Emphysema- Pulmonary Fibrosis ( Feb. 9, 2016 )  . Coronary atherosclerosis   . Dysrhythmia    A-Fib  . Hematuria, gross    HISTORY OF  ?  2004  . Hypertension    dr Daneen Schick  . Immune disorder ([place])   . Myocardial infarction ([place]) 2000  . Pulmonary fibrosis ([place])   . Rheumatoid arthritis ([place])   . Shortness of breath    with exertion  . Staph skin infection    Back    Past Surgical History:  Procedure Laterality Date  . ABDOMINAL AORTIC ENDOVASCULAR STENT GRAFT N/A 01/06/2016   Procedure: ABDOMINAL AORTIC ENDOVASCULAR STENT GRAFT;  Surgeon: Waynetta Sandy, MD;  Location: [facility] OR;  Service: Vascular;  Laterality: N/A;  . arm surgery     result  of staph infection  . BACK SURGERY    . CORONARY ANGIOPLASTY    . ESOPHAGOGASTRODUODENOSCOPY  2004    ?  . esophagus wrapped    . EYE SURGERY Bilateral 03-14-13   [place]  . HAND SURGERY     rel to staph  . JOINT REPLACEMENT     bil hips  . JOINT REPLACEMENT Right 01-20-13   Knee  . stents  2000  . TOTAL KNEE ARTHROPLASTY Right 01/20/2013   Procedure: RIGHT TOTAL KNEE ARTHROPLASTY;  Surgeon: Kerin Salen, MD;  Location: [facility];  Service: Orthopedics;  Laterality: Right;     Current Outpatient Medications  Medication Sig Dispense Refill  . aspirin 81 MG tablet Take 81 mg by mouth daily.    Marland Kitchen atorvastatin (LIPITOR) 40 MG tablet Take 40 mg by mouth daily.    . calcium carbonate (OS-CAL) 600 MG TABS Take 600 mg by mouth daily.     . carboxymethylcellulose (REFRESH PLUS) 0.5 % SOLN 1 drop 3 (three) times daily as needed.    . donepezil (ARICEPT) 5 MG tablet Take 5 mg by mouth at bedtime.    . DULoxetine (CYMBALTA) 30 MG capsule Take 30 mg by mouth daily.    . famotidine (PEPCID) 40 MG tablet Take 40 mg by mouth daily.    . ferrous sulfate 325 (65 FE) MG tablet Take 325  mg by mouth daily with breakfast.    . hydroxychloroquine (PLAQUENIL) 200 MG tablet Take 200 mg by mouth daily.     . metoprolol tartrate (LOPRESSOR) 25 MG tablet Take 25 mg by mouth 2 (two) times daily.    . Multiple Vitamin (MULTIVITAMIN) capsule Take 1 capsule by mouth daily.      . Multiple Vitamins-Minerals (OCUVITE PRESERVISION PO) Take 1 tablet by mouth daily.    . nitroGLYCERIN (NITROSTAT) 0.4 MG SL tablet Place 1 tablet (0.4 mg total) under the tongue every 5 (five) minutes as needed for chest pain. 25 tablet 3  . predniSONE (DELTASONE) 5 MG tablet Take 5 mg by mouth daily.      Marland Kitchen sulfaSALAzine (AZULFIDINE) 500 MG tablet Take 1,000 mg by mouth 2 (two) times daily.     . Tamsulosin HCl (FLOMAX) 0.4 MG CAPS Take 0.4 mg by mouth daily.      No current facility-administered medications for this visit.     Allergies:   Morphine and Morphine and related    Social History:  The patient  reports that he quit smoking about 41 years ago. His smoking use included cigarettes. He has a 150.00 pack-year smoking history. He has quit using smokeless tobacco.  His smokeless tobacco use included chew. He reports that he does not drink alcohol and does not use drugs.   Family History:  The patient's family history includes Bleeding Disorder in his father; COPD in his father and sister; Diabetes in his son; Heart attack in his brother and son; Heart disease in his brother and son; Hypertension in his brother and son; Stroke in his brother; Varicose Veins in his sister.    ROS:  General:no colds or fevers, no weight changes Skin:no rashes or ulcers HEENT:no blurred vision, no congestion CV:see HPI PUL:see HPI GI:no diarrhea constipation or melena, no indigestion GU:no hematuria, no dysuria MS:no joint pain, no claudication Neuro:no syncope, no lightheadedness Endo:no diabetes, no thyroid disease  Wt Readings from Last 3 Encounters:  03/01/20 166 lb 12.8 oz (75.7 kg)  02/24/19 176 lb 12.8 oz (80.2 kg)  01/11/18 172 lb 9.6 oz (78.3 kg)     PHYSICAL EXAM: VS:  BP 128/80   Pulse 78   Ht 6' (1.829 m)   Wt 166 lb 12.8 oz (75.7 kg)   SpO2 100%   BMI 22.62 kg/m  , BMI Body mass index is 22.62 kg/m. General:Pleasant affect, NAD Skin:Warm and dry, brisk capillary refill HEENT:normocephalic, sclera clear, mucus membranes moist Neck:supple, no JVD, no bruits  Heart:S1S2 RRR without murmur, gallup, rub or click Lungs:clear without rales, rhonchi, or wheezes RKY:HCWC, non tender, + BS, do not palpate liver spleen or masses Ext:no lower ext edema, 2+ pedal pulses, 2+ radial pulses Neuro:alert and oriented X3, MAE, follows commands, + facial symmetry    EKG:  EKG is ordered today. The ekg ordered today demonstrates SR LVH, inf infarct but stable with no changes. Except prior PVCs resolved     Recent Labs: No results found for requested labs within last 8760 hours.    Lipid Panel No results found for: CHOL, TRIG, HDL, CHOLHDL, VLDL, LDLCALC, LDLDIRECT     Other studies Reviewed: Additional studies/ records that were reviewed today include: .  11/2015 Nuc study  Nuclear stress EF: 50%.  Defect 1: There is a small defect of mild severity present in the basal inferoseptal location.  This is a low risk study.  The left ventricular ejection fraction is mildly decreased (45-54%).  Low risk stress nuclear study with a small inferoseptal scar, no reversible ischemia. Borderline low left ventricular global systolic function, slightly improved since 2015.  Echo 05/05/13 Study Conclusions   - Left ventricle: The cavity size was normal. Wall thickness  was increased in a pattern of mild LVH. There was mild  focal basal hypertrophy of the septum. The estimated  ejection fraction was 50%. Although no diagnostic regional  wall motion abnormality was identified, this possibility  cannot be completely excluded on the basis of this study.  Doppler parameters are consistent with abnormal left  ventricular relaxation (grade 1 diastolic dysfunction).  - Aortic valve: Transvalvular velocity was increased, due to  stenosis. There was mild stenosis. Trivial regurgitation.  - Mitral valve: Mild regurgitation.  - Left atrium: The atrium was mildly dilated.  - Pulmonary arteries: Systolic pressure was mildly  increased. PA peak pressure: 74mm Hg (S).    ASSESSMENT AND PLAN:  1.  CAD of native heart and native coronary artery stable. Bare metal stent to RCA in past. No chest pain. On ASA and statin and low dose BB  2. HLD last LDL 99 hdl 51 continue statin  3.  PAF maintaining SR   4.  SOB has increased, could be pulmonary fibrosis will check echo.   5.  Essential HTN controlled. Continue meds  6.  AAA without rupture with infrarenal aortic stent graft  with persistent type 2 endoleak stable.  followed by Dr. Donzetta Matters.  Stable in 2019.  No abd pain.   Current medicines are reviewed with the patient today.  The patient Has no concerns regarding medicines.  The following changes have been made:  See above Labs/ tests ordered today include:see above  Disposition:   FU:  see above  Signed, Cecilie Kicks, NP  03/02/2020 10:30 PM    [address], [place], [place], [place] Phone: [phone]; Fax: [phone]

## 2020-03-01 ENCOUNTER — Other Ambulatory Visit: Payer: Self-pay

## 2020-03-01 ENCOUNTER — Ambulatory Visit: Payer: PRIVATE HEALTH INSURANCE | Admitting: Cardiology

## 2020-03-01 ENCOUNTER — Encounter: Payer: Self-pay | Admitting: Cardiology

## 2020-03-01 ENCOUNTER — Ambulatory Visit (INDEPENDENT_AMBULATORY_CARE_PROVIDER_SITE_OTHER): Payer: Medicare Other | Admitting: Cardiology

## 2020-03-01 VITALS — BP 128/80 | HR 78 | Ht 72.0 in | Wt 166.8 lb

## 2020-03-01 DIAGNOSIS — I714 Abdominal aortic aneurysm, without rupture, unspecified: Secondary | ICD-10-CM

## 2020-03-01 DIAGNOSIS — I480 Paroxysmal atrial fibrillation: Secondary | ICD-10-CM

## 2020-03-01 DIAGNOSIS — I2510 Atherosclerotic heart disease of native coronary artery without angina pectoris: Secondary | ICD-10-CM | POA: Diagnosis not present

## 2020-03-01 DIAGNOSIS — I10 Essential (primary) hypertension: Secondary | ICD-10-CM | POA: Diagnosis not present

## 2020-03-01 DIAGNOSIS — E782 Mixed hyperlipidemia: Secondary | ICD-10-CM

## 2020-03-01 DIAGNOSIS — I350 Nonrheumatic aortic (valve) stenosis: Secondary | ICD-10-CM | POA: Diagnosis not present

## 2020-03-01 NOTE — Patient Instructions (Signed)
Medication Instructions:  Your physician recommends that you continue on your current medications as directed. Please refer to the Current Medication list given to you today.  *If you need a refill on your cardiac medications before your next appointment, please call your pharmacy*   Lab Work: None ordered  If you have labs (blood work) drawn today and your tests are completely normal, you will receive your results only by: Marland Kitchen MyChart Message (if you have MyChart) OR . A paper copy in the mail If you have any lab test that is abnormal or we need to change your treatment, we will call you to review the results.   Testing/Procedures: Your physician has requested that you have an echocardiogram. Echocardiography is a painless test that uses sound waves to create images of your heart. It provides your doctor with information about the size and shape of your heart and how well your heart's chambers and valves are working. This procedure takes approximately one hour. There are no restrictions for this procedure.     Follow-Up: At [facility], you and your health needs are our priority.  As part of our continuing mission to provide you with exceptional heart care, we have created designated Provider Care Teams.  These Care Teams include your primary Cardiologist (physician) and Advanced Practice Providers (APPs -  Physician Assistants and Nurse Practitioners) who all work together to provide you with the care you need, when you need it.  We recommend signing up for the patient portal called "MyChart".  Sign up information is provided on this After Visit Summary.  MyChart is used to connect with patients for Virtual Visits (Telemedicine).  Patients are able to view lab/test results, encounter notes, upcoming appointments, etc.  Non-urgent messages can be sent to your provider as well.   To learn more about what you can do with MyChart, go to [email or web address].    Your next appointment:    6 month(s)  The format for your next appointment:   In Person  Provider:   You may see Sinclair Grooms, MD or one of the following Advanced Practice Providers on your designated Care Team:    Truitt Merle, NP  Cecilie Kicks, NP  Kathyrn Drown, NP    Other Instructions  Echocardiogram An echocardiogram is a procedure that uses painless sound waves (ultrasound) to produce an image of the heart. Images from an echocardiogram can provide important information about:  Signs of coronary artery disease (CAD).  Aneurysm detection. An aneurysm is a weak or damaged part of an artery wall that bulges out from the normal force of blood pumping through the body.  Heart size and shape. Changes in the size or shape of the heart can be associated with certain conditions, including heart failure, aneurysm, and CAD.  Heart muscle function.  Heart valve function.  Signs of a past heart attack.  Fluid buildup around the heart.  Thickening of the heart muscle.  A tumor or infectious growth around the heart valves. Tell a health care provider about:  Any allergies you have.  All medicines you are taking, including vitamins, herbs, eye drops, creams, and over-the-counter medicines.  Any blood disorders you have.  Any surgeries you have had.  Any medical conditions you have.  Whether you are pregnant or may be pregnant. What are the risks? Generally, this is a safe procedure. However, problems may occur, including:  Allergic reaction to dye (contrast) that may be used during the procedure. What happens before the  procedure? No specific preparation is needed. You may eat and drink normally. What happens during the procedure?   An IV tube may be inserted into one of your veins.  You may receive contrast through this tube. A contrast is an injection that improves the quality of the pictures from your heart.  A gel will be applied to your chest.  A wand-like tool (transducer)  will be moved over your chest. The gel will help to transmit the sound waves from the transducer.  The sound waves will harmlessly bounce off of your heart to allow the heart images to be captured in real-time motion. The images will be recorded on a computer. The procedure may vary among health care providers and hospitals. What happens after the procedure?  You may return to your normal, everyday life, including diet, activities, and medicines, unless your health care provider tells you not to do that. Summary  An echocardiogram is a procedure that uses painless sound waves (ultrasound) to produce an image of the heart.  Images from an echocardiogram can provide important information about the size and shape of your heart, heart muscle function, heart valve function, and fluid buildup around your heart.  You do not need to do anything to prepare before this procedure. You may eat and drink normally.  After the echocardiogram is completed, you may return to your normal, everyday life, unless your health care provider tells you not to do that. This information is not intended to replace advice given to you by your health care provider. Make sure you discuss any questions you have with your health care provider. Document Revised: 07/04/2018 Document Reviewed: 04/15/2016 Elsevier Patient Education  [employer].

## 2020-03-02 ENCOUNTER — Encounter: Payer: Self-pay | Admitting: Cardiology

## 2020-03-24 ENCOUNTER — Other Ambulatory Visit (HOSPITAL_COMMUNITY): Payer: Medicare Other

## 2020-04-14 ENCOUNTER — Other Ambulatory Visit (HOSPITAL_COMMUNITY): Payer: Medicare Other

## 2020-04-27 DIAGNOSIS — K219 Gastro-esophageal reflux disease without esophagitis: Secondary | ICD-10-CM | POA: Diagnosis not present

## 2020-04-27 DIAGNOSIS — D509 Iron deficiency anemia, unspecified: Secondary | ICD-10-CM | POA: Diagnosis not present

## 2020-04-27 DIAGNOSIS — I10 Essential (primary) hypertension: Secondary | ICD-10-CM | POA: Diagnosis not present

## 2020-04-27 DIAGNOSIS — I25118 Atherosclerotic heart disease of native coronary artery with other forms of angina pectoris: Secondary | ICD-10-CM | POA: Diagnosis not present

## 2020-04-27 DIAGNOSIS — G8929 Other chronic pain: Secondary | ICD-10-CM | POA: Diagnosis not present

## 2020-04-27 DIAGNOSIS — M069 Rheumatoid arthritis, unspecified: Secondary | ICD-10-CM | POA: Diagnosis not present

## 2020-04-27 DIAGNOSIS — I480 Paroxysmal atrial fibrillation: Secondary | ICD-10-CM | POA: Diagnosis not present

## 2020-04-27 DIAGNOSIS — I2510 Atherosclerotic heart disease of native coronary artery without angina pectoris: Secondary | ICD-10-CM | POA: Diagnosis not present

## 2020-04-27 DIAGNOSIS — E7800 Pure hypercholesterolemia, unspecified: Secondary | ICD-10-CM | POA: Diagnosis not present

## 2020-05-12 ENCOUNTER — Ambulatory Visit (HOSPITAL_COMMUNITY): Payer: Medicare Other | Attending: Cardiology

## 2020-05-12 ENCOUNTER — Other Ambulatory Visit: Payer: Self-pay

## 2020-05-12 DIAGNOSIS — I2510 Atherosclerotic heart disease of native coronary artery without angina pectoris: Secondary | ICD-10-CM | POA: Diagnosis not present

## 2020-05-12 DIAGNOSIS — I350 Nonrheumatic aortic (valve) stenosis: Secondary | ICD-10-CM | POA: Diagnosis not present

## 2020-05-12 LAB — ECHOCARDIOGRAM COMPLETE
AR max vel: 1.3 cm2
AV Area VTI: 1.45 cm2
AV Area mean vel: 1.26 cm2
AV Mean grad: 10.5 mmHg
AV Peak grad: 18.5 mmHg
Ao pk vel: 2.15 m/s
Area-P 1/2: 2.03 cm2
P 1/2 time: 617 msec
S' Lateral: 3.5 cm

## 2020-06-19 DIAGNOSIS — I10 Essential (primary) hypertension: Secondary | ICD-10-CM | POA: Diagnosis not present

## 2020-06-19 DIAGNOSIS — M069 Rheumatoid arthritis, unspecified: Secondary | ICD-10-CM | POA: Diagnosis not present

## 2020-06-19 DIAGNOSIS — E7800 Pure hypercholesterolemia, unspecified: Secondary | ICD-10-CM | POA: Diagnosis not present

## 2020-06-19 DIAGNOSIS — K219 Gastro-esophageal reflux disease without esophagitis: Secondary | ICD-10-CM | POA: Diagnosis not present

## 2020-06-30 ENCOUNTER — Emergency Department (HOSPITAL_COMMUNITY): Payer: Medicare Other | Admitting: Anesthesiology

## 2020-06-30 ENCOUNTER — Emergency Department (HOSPITAL_COMMUNITY): Payer: Medicare Other

## 2020-06-30 ENCOUNTER — Inpatient Hospital Stay (HOSPITAL_COMMUNITY): Payer: Medicare Other

## 2020-06-30 ENCOUNTER — Encounter (HOSPITAL_COMMUNITY)
Admission: EM | Disposition: A | Discharge status: Home / Self Care | Payer: Self-pay | Source: Home / Self Care | Attending: Emergency Medicine

## 2020-06-30 ENCOUNTER — Other Ambulatory Visit: Payer: Self-pay

## 2020-06-30 ENCOUNTER — Observation Stay (HOSPITAL_COMMUNITY)
Admission: EM | Admit: 2020-06-30 | Discharge: 2020-07-01 | Disposition: A | Discharge status: Home / Self Care | Payer: Medicare Other | Attending: Internal Medicine | Admitting: Internal Medicine

## 2020-06-30 ENCOUNTER — Encounter (HOSPITAL_COMMUNITY): Payer: Self-pay | Admitting: Emergency Medicine

## 2020-06-30 DIAGNOSIS — S73004A Unspecified dislocation of right hip, initial encounter: Secondary | ICD-10-CM | POA: Diagnosis not present

## 2020-06-30 DIAGNOSIS — W010XXA Fall on same level from slipping, tripping and stumbling without subsequent striking against object, initial encounter: Secondary | ICD-10-CM | POA: Insufficient documentation

## 2020-06-30 DIAGNOSIS — S73014A Posterior dislocation of right hip, initial encounter: Secondary | ICD-10-CM | POA: Diagnosis not present

## 2020-06-30 DIAGNOSIS — R0689 Other abnormalities of breathing: Secondary | ICD-10-CM | POA: Diagnosis not present

## 2020-06-30 DIAGNOSIS — Z96641 Presence of right artificial hip joint: Secondary | ICD-10-CM | POA: Diagnosis not present

## 2020-06-30 DIAGNOSIS — T84029A Dislocation of unspecified internal joint prosthesis, initial encounter: Secondary | ICD-10-CM | POA: Diagnosis not present

## 2020-06-30 DIAGNOSIS — M25572 Pain in left ankle and joints of left foot: Secondary | ICD-10-CM | POA: Diagnosis not present

## 2020-06-30 DIAGNOSIS — I714 Abdominal aortic aneurysm, without rupture, unspecified: Secondary | ICD-10-CM | POA: Diagnosis present

## 2020-06-30 DIAGNOSIS — I2510 Atherosclerotic heart disease of native coronary artery without angina pectoris: Secondary | ICD-10-CM | POA: Insufficient documentation

## 2020-06-30 DIAGNOSIS — R0602 Shortness of breath: Secondary | ICD-10-CM | POA: Diagnosis not present

## 2020-06-30 DIAGNOSIS — Z043 Encounter for examination and observation following other accident: Secondary | ICD-10-CM | POA: Diagnosis not present

## 2020-06-30 DIAGNOSIS — W19XXXA Unspecified fall, initial encounter: Secondary | ICD-10-CM

## 2020-06-30 DIAGNOSIS — J8410 Pulmonary fibrosis, unspecified: Secondary | ICD-10-CM | POA: Diagnosis not present

## 2020-06-30 DIAGNOSIS — Z9889 Other specified postprocedural states: Secondary | ICD-10-CM

## 2020-06-30 DIAGNOSIS — M25551 Pain in right hip: Secondary | ICD-10-CM | POA: Diagnosis not present

## 2020-06-30 DIAGNOSIS — F17200 Nicotine dependence, unspecified, uncomplicated: Secondary | ICD-10-CM | POA: Insufficient documentation

## 2020-06-30 DIAGNOSIS — Z743 Need for continuous supervision: Secondary | ICD-10-CM | POA: Diagnosis not present

## 2020-06-30 DIAGNOSIS — J449 Chronic obstructive pulmonary disease, unspecified: Secondary | ICD-10-CM | POA: Diagnosis not present

## 2020-06-30 DIAGNOSIS — J9621 Acute and chronic respiratory failure with hypoxia: Secondary | ICD-10-CM | POA: Insufficient documentation

## 2020-06-30 DIAGNOSIS — T84020A Dislocation of internal right hip prosthesis, initial encounter: Secondary | ICD-10-CM | POA: Diagnosis not present

## 2020-06-30 DIAGNOSIS — Z96649 Presence of unspecified artificial hip joint: Secondary | ICD-10-CM

## 2020-06-30 DIAGNOSIS — E785 Hyperlipidemia, unspecified: Secondary | ICD-10-CM | POA: Diagnosis not present

## 2020-06-30 DIAGNOSIS — I10 Essential (primary) hypertension: Secondary | ICD-10-CM | POA: Insufficient documentation

## 2020-06-30 DIAGNOSIS — S73006A Unspecified dislocation of unspecified hip, initial encounter: Secondary | ICD-10-CM

## 2020-06-30 DIAGNOSIS — I480 Paroxysmal atrial fibrillation: Secondary | ICD-10-CM | POA: Insufficient documentation

## 2020-06-30 DIAGNOSIS — F0390 Unspecified dementia without behavioral disturbance: Secondary | ICD-10-CM | POA: Diagnosis not present

## 2020-06-30 DIAGNOSIS — M96662 Fracture of femur following insertion of orthopedic implant, joint prosthesis, or bone plate, left leg: Secondary | ICD-10-CM | POA: Diagnosis not present

## 2020-06-30 DIAGNOSIS — Z79899 Other long term (current) drug therapy: Secondary | ICD-10-CM | POA: Diagnosis not present

## 2020-06-30 DIAGNOSIS — U071 COVID-19: Secondary | ICD-10-CM | POA: Diagnosis not present

## 2020-06-30 DIAGNOSIS — S0990XA Unspecified injury of head, initial encounter: Secondary | ICD-10-CM | POA: Diagnosis not present

## 2020-06-30 HISTORY — PX: HIP CLOSED REDUCTION: SHX983

## 2020-06-30 LAB — BASIC METABOLIC PANEL
Anion gap: 9 (ref 5–15)
BUN: 19 mg/dL (ref 8–23)
CO2: 26 mmol/L (ref 22–32)
Calcium: 9 mg/dL (ref 8.9–10.3)
Chloride: 108 mmol/L (ref 98–111)
Creatinine, Ser: 0.74 mg/dL (ref 0.61–1.24)
GFR, Estimated: >60 mL/min (ref >60)
Glucose, Bld: 98 mg/dL (ref 70–99)
Potassium: 3.8 mmol/L (ref 3.5–5.1)
Sodium: 143 mmol/L (ref 135–145)

## 2020-06-30 LAB — CBC WITH DIFFERENTIAL/PLATELET
Abs Immature Granulocytes: 0.04 10*3/uL (ref 0.00–0.07)
Basophils Absolute: 0 10*3/uL (ref 0.0–0.1)
Basophils Relative: 0 %
Eosinophils Absolute: 0.2 10*3/uL (ref 0.0–0.5)
Eosinophils Relative: 2 %
HCT: 40.1 % (ref 39.0–52.0)
Hemoglobin: 12.8 g/dL — ABNORMAL LOW (ref 13.0–17.0)
Immature Granulocytes: 0 %
Lymphocytes Relative: 10 %
Lymphs Abs: 1 10*3/uL (ref 0.7–4.0)
MCH: 33.1 pg (ref 26.0–34.0)
MCHC: 31.9 g/dL (ref 30.0–36.0)
MCV: 103.6 fL — ABNORMAL HIGH (ref 80.0–100.0)
Monocytes Absolute: 0.7 10*3/uL (ref 0.1–1.0)
Monocytes Relative: 7 %
Neutro Abs: 7.9 10*3/uL — ABNORMAL HIGH (ref 1.7–7.7)
Neutrophils Relative %: 81 %
Platelets: 234 10*3/uL (ref 150–400)
RBC: 3.87 MIL/uL — ABNORMAL LOW (ref 4.22–5.81)
RDW: 14 % (ref 11.5–15.5)
WBC: 9.9 10*3/uL (ref 4.0–10.5)
nRBC: 0 % (ref 0.0–0.2)

## 2020-06-30 LAB — RESP PANEL BY RT-PCR (FLU A&B, COVID) ARPGX2
Influenza A by PCR: NEGATIVE
Influenza B by PCR: NEGATIVE
SARS Coronavirus 2 by RT PCR: POSITIVE — AB

## 2020-06-30 SURGERY — CLOSED REDUCTION, HIP
Anesthesia: General | Site: Hip | Laterality: Right

## 2020-06-30 MED ORDER — METOPROLOL TARTRATE 25 MG PO TABS
25.0000 mg | ORAL_TABLET | Freq: Two times a day (BID) | ORAL | Status: DC
  Administered 2020-06-30 – 2020-07-01 (×2): 25 mg via ORAL
  Filled 2020-06-30: qty 1

## 2020-06-30 MED ORDER — FERROUS SULFATE 325 (65 FE) MG PO TABS: 325.0000 mg | ORAL_TABLET | Freq: Every day | ORAL | Status: DC

## 2020-06-30 MED ORDER — HYDROCODONE-ACETAMINOPHEN 5-325 MG PO TABS: 1.0000 | ORAL_TABLET | ORAL | Status: DC | PRN

## 2020-06-30 MED ORDER — FENTANYL CITRATE (PF) 100 MCG/2ML IJ SOLN
50.0000 ug | Freq: Once | INTRAMUSCULAR | Status: AC
  Administered 2020-06-30: 50 ug via INTRAVENOUS
  Filled 2020-06-30: qty 2

## 2020-06-30 MED ORDER — PROPOFOL 10 MG/ML IV BOLUS
40.0000 mg | Freq: Once | INTRAVENOUS | Status: AC
  Administered 2020-06-30: 40 mg via INTRAVENOUS
  Filled 2020-06-30: qty 20

## 2020-06-30 MED ORDER — ENOXAPARIN SODIUM 30 MG/0.3ML ~~LOC~~ SOLN: 30.0000 mg | SUBCUTANEOUS | Status: DC

## 2020-06-30 MED ORDER — PROPOFOL 10 MG/ML IV BOLUS
INTRAVENOUS | Status: AC | PRN
  Administered 2020-06-30 (×2): 20 mg via INTRAVENOUS

## 2020-06-30 MED ORDER — ORAL CARE MOUTH RINSE: 15.0000 mL | Freq: Once | OROMUCOSAL | Status: DC

## 2020-06-30 MED ORDER — ATORVASTATIN CALCIUM 40 MG PO TABS
40.0000 mg | ORAL_TABLET | Freq: Every day | ORAL | Status: DC
  Administered 2020-06-30: 40 mg via ORAL
  Filled 2020-06-30: qty 1

## 2020-06-30 MED ORDER — HYDROCODONE-ACETAMINOPHEN 7.5-325 MG PO TABS: 1.0000 | ORAL_TABLET | ORAL | Status: DC | PRN

## 2020-06-30 MED ORDER — ONDANSETRON HCL 4 MG PO TABS: 4.0000 mg | ORAL_TABLET | Freq: Four times a day (QID) | ORAL | Status: DC | PRN

## 2020-06-30 MED ORDER — ACETAMINOPHEN 325 MG PO TABS
325.0000 mg | ORAL_TABLET | Freq: Four times a day (QID) | ORAL | Status: DC | PRN
  Administered 2020-07-01: 650 mg via ORAL

## 2020-06-30 MED ORDER — ASPIRIN EC 81 MG PO TBEC
81.0000 mg | DELAYED_RELEASE_TABLET | Freq: Every day | ORAL | Status: DC
  Administered 2020-07-01: 81 mg via ORAL

## 2020-06-30 MED ORDER — ASCORBIC ACID 500 MG PO TABS
500.0000 mg | ORAL_TABLET | Freq: Every day | ORAL | Status: DC
  Administered 2020-06-30 – 2020-07-01 (×2): 500 mg via ORAL
  Filled 2020-06-30: qty 1

## 2020-06-30 MED ORDER — HYDROXYCHLOROQUINE SULFATE 200 MG PO TABS
200.0000 mg | ORAL_TABLET | Freq: Every day | ORAL | Status: DC
  Administered 2020-07-01: 200 mg via ORAL

## 2020-06-30 MED ORDER — ADULT MULTIVITAMIN W/MINERALS CH
1.0000 | ORAL_TABLET | Freq: Every day | ORAL | Status: DC
  Administered 2020-07-01: 1 via ORAL

## 2020-06-30 MED ORDER — SODIUM CHLORIDE 0.9 % IV SOLN: INTRAVENOUS | Status: DC | PRN

## 2020-06-30 MED ORDER — TAMSULOSIN HCL 0.4 MG PO CAPS
0.4000 mg | ORAL_CAPSULE | Freq: Every day | ORAL | Status: DC
  Administered 2020-07-01: 0.4 mg via ORAL

## 2020-06-30 MED ORDER — MULTIVITAMINS PO CAPS: 1.0000 | ORAL_CAPSULE | Freq: Every day | ORAL | Status: DC

## 2020-06-30 MED ORDER — PROPOFOL 10 MG/ML IV BOLUS
INTRAVENOUS | Status: AC
  Filled 2020-06-30: qty 20

## 2020-06-30 MED ORDER — DONEPEZIL HCL 5 MG PO TABS
5.0000 mg | ORAL_TABLET | Freq: Every day | ORAL | Status: DC
  Filled 2020-06-30: qty 1

## 2020-06-30 MED ORDER — DULOXETINE HCL 30 MG PO CPEP
30.0000 mg | ORAL_CAPSULE | Freq: Every day | ORAL | Status: DC
  Administered 2020-06-30 – 2020-07-01 (×2): 30 mg via ORAL
  Filled 2020-06-30: qty 1

## 2020-06-30 MED ORDER — CALCIUM CARBONATE 1250 (500 CA) MG PO TABS: 1.0000 | ORAL_TABLET | Freq: Every day | ORAL | Status: DC

## 2020-06-30 MED ORDER — PREDNISONE 10 MG PO TABS: 5.0000 mg | ORAL_TABLET | Freq: Every day | ORAL | Status: DC

## 2020-06-30 MED ORDER — NITROGLYCERIN 0.4 MG SL SUBL: 0.4000 mg | SUBLINGUAL_TABLET | SUBLINGUAL | Status: DC | PRN

## 2020-06-30 MED ORDER — ZINC 50 MG PO TABS: 1.0000 | ORAL_TABLET | Freq: Every day | ORAL | Status: DC

## 2020-06-30 MED ORDER — VITAMIN D 25 MCG (1000 UNIT) PO TABS
1000.0000 [IU] | ORAL_TABLET | Freq: Every day | ORAL | Status: DC
  Administered 2020-06-30: 1000 [IU] via ORAL
  Filled 2020-06-30: qty 1

## 2020-06-30 MED ORDER — SULFASALAZINE 500 MG PO TABS
1000.0000 mg | ORAL_TABLET | Freq: Two times a day (BID) | ORAL | Status: DC
  Administered 2020-06-30 – 2020-07-01 (×2): 1000 mg via ORAL
  Filled 2020-06-30 (×4): qty 2

## 2020-06-30 MED ORDER — FAMOTIDINE 20 MG PO TABS
40.0000 mg | ORAL_TABLET | Freq: Every day | ORAL | Status: DC
  Administered 2020-07-01: 40 mg via ORAL

## 2020-06-30 MED ORDER — PROPOFOL 10 MG/ML IV BOLUS
INTRAVENOUS | Status: DC | PRN
  Administered 2020-06-30: 100 mg via INTRAVENOUS

## 2020-06-30 MED ORDER — TRAMADOL HCL 50 MG PO TABS
50.0000 mg | ORAL_TABLET | Freq: Four times a day (QID) | ORAL | Status: DC
  Administered 2020-07-01: 50 mg via ORAL
  Filled 2020-06-30 (×2): qty 1

## 2020-06-30 MED ORDER — ONDANSETRON HCL 4 MG/2ML IJ SOLN: 4.0000 mg | Freq: Four times a day (QID) | INTRAMUSCULAR | Status: DC | PRN

## 2020-06-30 MED ORDER — CALCIUM CARBONATE 600 MG PO TABS: 600.0000 mg | ORAL_TABLET | Freq: Every day | ORAL | Status: DC

## 2020-06-30 MED ORDER — CHLORHEXIDINE GLUCONATE 0.12 % MT SOLN: 15.0000 mL | Freq: Once | OROMUCOSAL | Status: DC

## 2020-06-30 MED ORDER — ZINC SULFATE 220 (50 ZN) MG PO CAPS
220.0000 mg | ORAL_CAPSULE | Freq: Every day | ORAL | Status: DC
  Administered 2020-06-30: 220 mg via ORAL
  Filled 2020-06-30: qty 1

## 2020-06-30 MED ORDER — LACTATED RINGERS IV SOLN: INTRAVENOUS | Status: DC

## 2020-06-30 SURGICAL SUPPLY — 7 items
CLOTH BEACON ORANGE TIMEOUT ST (SAFETY) ×2 IMPLANT
COVER WAND RF STERILE (DRAPES) ×2 IMPLANT
GLOVE SURG UNDER POLY LF SZ7 (GLOVE) ×2 IMPLANT
KIT TURNOVER KIT A (KITS) ×2 IMPLANT
PILLOW HIP ABDUCTION LRG (ORTHOPEDIC SUPPLIES) IMPLANT
PILLOW HIP ABDUCTION MED (ORTHOPEDIC SUPPLIES) IMPLANT
PILLOW HIP ABDUCTION SM (ORTHOPEDIC SUPPLIES) IMPLANT

## 2020-06-30 NOTE — H&P (Addendum)
History and Physical    [name] HWE:[id number] DOB: [birth year] DOA: 06/30/2020  PCP: Lajean Manes, MD   Patient coming from: Home  I have personally briefly reviewed patient's old medical records in [place]  Chief Complaint: Home  HPI: [name] is a 87 y.o. male with medical history significant for pulmonary fibrosis, COPD, atrial fibrillation, coronary artery disease, dementia. Patient was brought to the ED with reports of a fall.  Patient was getting off his lawnmower, when he felt a pop in his right hip and subsequently fell to the floor.  He reports he was alert the whole time, did not lose consciousness and did not hit his head.  Fall was not witnessed.  Spouse reports a history of dementia, albeit mild. Patient denies chest pain, no difficulty breathing.  He reports occasional dizziness on standing, this is chronic. Patient is supposed to be on home O2, but he does not use it.  Spouse reports at rest patient's O2 sats 9090s, but with exertion it drops to the 80s.  ED Course: Stable vitals.  O2 sats greater than 95%.  CBC CMP unremarkable.  Xray hip- dislocation of the right hip prosthesis.  Reduction was attempted in ED, with sedation, but patient's O2 sats dropped to 85% on nonrebreather, and patient became hypotensive systolic dropping to 89/38. Hence reduction was terminated, othopedics and anesthesiology was consulted.,  Patient was taken to the OR.  Review of Systems: As per HPI all other systems reviewed and negative.  Past Medical History:  Diagnosis Date  . AAA (abdominal aortic aneurysm) ([place])   . Anemia   . Arthritis    ra  . Atrial fibrillation ([place])   . COPD (chronic obstructive pulmonary disease) ([place]) Jan.2016   Small amount of Emphysema- Pulmonary Fibrosis ( Feb. 9, 2016 )  . Coronary atherosclerosis   . Dysrhythmia    A-Fib  . Hematuria, gross    HISTORY OF  ?  2004  . Hypertension    dr Daneen Schick  . Immune disorder ([place])   .  Myocardial infarction ([place]) 2000  . Pulmonary fibrosis ([place])   . Rheumatoid arthritis ([place])   . Shortness of breath    with exertion  . Staph skin infection    Back    Past Surgical History:  Procedure Laterality Date  . ABDOMINAL AORTIC ENDOVASCULAR STENT GRAFT N/A 01/06/2016   Procedure: ABDOMINAL AORTIC ENDOVASCULAR STENT GRAFT;  Surgeon: Waynetta Sandy, MD;  Location: [facility] OR;  Service: Vascular;  Laterality: N/A;  . arm surgery     result of staph infection  . BACK SURGERY    . CORONARY ANGIOPLASTY    . ESOPHAGOGASTRODUODENOSCOPY  2004    ?  . esophagus wrapped    . EYE SURGERY Bilateral 03-14-13   [place]  . HAND SURGERY     rel to staph  . JOINT REPLACEMENT     bil hips  . JOINT REPLACEMENT Right 01-20-13   Knee  . stents  2000  . TOTAL KNEE ARTHROPLASTY Right 01/20/2013   Procedure: RIGHT TOTAL KNEE ARTHROPLASTY;  Surgeon: Kerin Salen, MD;  Location: [place];  Service: Orthopedics;  Laterality: Right;     reports that he quit smoking about 42 years ago. His smoking use included cigarettes. He has a 150.00 pack-year smoking history. He has quit using smokeless tobacco.  His smokeless tobacco use included chew. He reports that he does not drink alcohol and does not use drugs.  Allergies  Allergen Reactions  . Morphine Other (See Comments)    "makes me crazy"  . Morphine And Related Nausea And Vomiting and Other (See Comments)    Hallucinations     Family History  Problem Relation Age of Onset  . Heart disease Brother        Heart Disease before age 88  . Stroke Brother   . Heart attack Brother   . Hypertension Brother   . Diabetes Son   . Heart disease Son        Heart Disease before age 38  . Hypertension Son   . Heart attack Son   . Bleeding Disorder Father   . COPD Father        Emphysema  . Varicose Veins Sister   . COPD Sister     Prior to Admission medications   Medication Sig Start Date End Date Taking? Authorizing Provider   Ascorbic Acid (VITAMIN C) 500 MG CAPS Take 1 tablet by mouth daily.   Yes [provider]  aspirin 81 MG tablet Take 81 mg by mouth daily.   Yes [provider]  atorvastatin (LIPITOR) 40 MG tablet Take 40 mg by mouth daily.   Yes [provider]  calcium carbonate (OS-CAL) 600 MG TABS Take 600 mg by mouth daily.   Yes [provider]  carboxymethylcellulose (REFRESH PLUS) 0.5 % SOLN 1 drop 3 (three) times daily as needed (dry eyes).   Yes [provider]  cholecalciferol (VITAMIN D3) 25 MCG (1000 UNIT) tablet Take 1 tablet by mouth daily.   Yes [provider]  donepezil (ARICEPT) 5 MG tablet Take 5 mg by mouth at bedtime.   Yes [provider]  DULoxetine (CYMBALTA) 30 MG capsule Take 30 mg by mouth daily.   Yes [provider]  famotidine (PEPCID) 40 MG tablet Take 40 mg by mouth daily. 10/11/18  Yes [provider]  ferrous sulfate 325 (65 FE) MG tablet Take 325 mg by mouth daily with breakfast.   Yes [provider]  hydroxychloroquine (PLAQUENIL) 200 MG tablet Take 200 mg by mouth daily.   Yes [provider]  metoprolol tartrate (LOPRESSOR) 25 MG tablet Take 25 mg by mouth 2 (two) times daily.   Yes [provider]  Multiple Vitamin (MULTIVITAMIN) capsule Take 1 capsule by mouth daily.   Yes [provider]  Multiple Vitamins-Minerals (OCUVITE PRESERVISION PO) Take 1 tablet by mouth daily.   Yes [provider]  Omega-3 Fatty Acids (FISH OIL) 1000 MG CAPS Take 1 capsule by mouth daily.   Yes [provider]  predniSONE (DELTASONE) 5 MG tablet Take 5-10 mg by mouth daily.   Yes [provider]  sulfaSALAzine (AZULFIDINE) 500 MG tablet Take 1,000 mg by mouth 2 (two) times daily.    Yes [provider]  tamsulosin (FLOMAX) 0.4 MG CAPS capsule Take 0.4 mg by mouth daily.   Yes [provider]  Zinc 50 MG TABS Take 1 tablet by mouth  daily.   Yes [provider]  nitroGLYCERIN (NITROSTAT) 0.4 MG SL tablet Place 1 tablet (0.4 mg total) under the tongue every 5 (five) minutes as needed for chest pain. 02/27/14   Belva Crome, MD    Physical Exam: Vitals:   06/30/20 1400 06/30/20 1445 06/30/20 1530 06/30/20 1545  BP: (!) 127/97 104/72 140/72 (!) 109/91  Pulse: 90 86 66 66  Resp: 17 18 20 18   SpO2: 90% 98% 97% 96%  Constitutional: In mild painful distress Vitals:   06/30/20 1400 06/30/20 1445 06/30/20 1530 06/30/20 1545  BP: (!) 127/97 104/72 140/72 (!) 109/91  Pulse: 90 86 66 66  Resp: 17 18 20 18   SpO2: 90% 98% 97% 96%   Eyes: PERRL, lids and conjunctivae normal ENMT: Mucous membranes are moist.  Neck: normal, supple, no masses, no thyromegaly Respiratory: Coarse crackles bilaterally,no wheezing, no crackles.  Mild increased work of breathing, . No accessory muscle use.  Cardiovascular: Regular rate and rhythm, no murmurs / rubs / gallops. No extremity edema. 2+ pedal pulses.   Abdomen: no tenderness, no masses palpated. No hepatosplenomegaly. Bowel sounds positive.  Musculoskeletal: no clubbing / cyanosis.  Right lower extremity immobilized.  No deformity to left lower extremity.  Skin: no rashes, lesions, ulcers. No induration Neurologic: No apparent cranial nerve abnormality , able to move extremities spontaneously.  Right lower extremity not tested. Psychiatric: Normal judgment and insight. Alert and oriented x 3. Normal mood.   Labs on Admission: I have personally reviewed following labs and imaging studies  CBC: Recent Labs  Lab 06/30/20 1222  WBC 9.9  NEUTROABS 7.9*  HGB 12.8*  HCT 40.1  MCV 103.6*  PLT 371   Basic Metabolic Panel: Recent Labs  Lab 06/30/20 1222  NA 143  K 3.8  CL 108  CO2 26  GLUCOSE 98  BUN 19  CREATININE 0.74  CALCIUM 9.0    Radiological Exams on Admission: DG Pelvis Portable  Result Date: 06/30/2020 CLINICAL DATA:  Pain EXAM: PORTABLE PELVIS 1-2  VIEWS COMPARISON:  May 27, 2018 FINDINGS: Frontal pelvis image obtained. Total hip replacements on each side. On frontal view, prosthetic components on the left appear well seated. On the right, there is questionable dislocation; the femoral component of the prosthesis appears somewhat laterally positioned compared to the acetabular component, although the femoral component overlies the acetabular component. No fracture evident. There is underlying osteoporosis. There are stents in each iliac artery. IMPRESSION: Total hip replacements bilaterally. A degree of dislocation the right cannot be excluded as noted; advise lateral image of the right hip for further assessment in this regard. Prosthetic components on the left appear well-seated on frontal view. Underlying osteoporosis.  No fracture evident. Electronically Signed   By: Lowella Grip III M.D.   On: 06/30/2020 12:46   DG Chest Portable 1 View  Result Date: 06/30/2020 CLINICAL DATA:  Shortness of breath. EXAM: PORTABLE CHEST 1 VIEW COMPARISON:  12/25/2017 FINDINGS: Diffuse coarse lung densities compatible with chronic changes and pulmonary fibrosis. The degree of parenchymal lung disease has progressed since 2019. Difficult to evaluate the cardiac silhouette due to the adjacent lung disease. Atherosclerotic calcifications at the aortic arch. IMPRESSION: Diffuse bilateral parenchymal lung disease. Findings are compatible with chronic changes and fibrosis. The degree of lung disease has likely progressed since 2019. Findings could be related to pulmonary fibrosis. Difficult to exclude an acute infectious or inflammatory process. Electronically Signed   By: Markus Daft M.D.   On: 06/30/2020 16:11   DG HIP UNILAT WITH PELVIS 1V RIGHT  Result Date: 06/30/2020 CLINICAL DATA:  Postreduction x-ray EXAM: DG HIP (WITH OR WITHOUT PELVIS) 1V RIGHT COMPARISON:  None. FINDINGS: Generalized osteopenia. Right total hip arthroplasty. Persistent superior posterior  right hip dislocation. Old healed right femoral mid diaphyseal fracture. IMPRESSION: 1. Persistent superior posterior right hip dislocation. Electronically Signed   By: Kathreen Devoid   On: 06/30/2020 14:27   DG Hip Unilat With Pelvis 2-3 Views Right  Result Date: 06/30/2020 CLINICAL DATA:  Hip dislocation EXAM: DG HIP (WITH OR WITHOUT PELVIS) 2-3V RIGHT COMPARISON:  None. FINDINGS: Right total hip prosthesis. Posterior dislocation of the femoral head component relative to the acetabular cup on the cross-table lateral view. Suspected left hip arthroplasty. No acute fracture is seen.  Old right femoral shaft fracture. Iliac stents. IMPRESSION: Posterior dislocation of the right hip prosthesis, as above. Electronically Signed   By: Julian Hy M.D.   On: 06/30/2020 13:33    EKG: Independently reviewed.  Sinus rhythm rate 75, QTc 470.  LVH.  No significant change from prior EKG.  Assessment/Plan Principal Problem:   Dislocation of hip prosthesis (HCC) Active Problems:   Asymptomatic COVID-19 virus infection   CAD (coronary artery disease), native coronary artery   Paroxysmal atrial fibrillation (HCC)   Pulmonary fibrosis assoc with RA    Essential hypertension   Abdominal aortic aneurysm without rupture (HCC)   Hip dislocation, right (HCC)  Right hip dislocation, fall-x-ray shows posterior right hip dislocation.  Reduction attempted in ED with sedation was unsuccessful, patient became hypoxic and hypotensive. - Ortho and anesthesiology consulted, patient taken to the OR for reduction. - IV morphine 2 mg Q4h PRN -Obtain head CT -Postop patient is doing well, O2 sats 96 % on room air. -Addendum family at bedside, reports that patient wants to leave tonight, but has agreed to stay till tomorrow, will be leaving very early in the morning. I have explained to son and spouse, reasons for overnight stay, including need for PT in a.m. -Physical Therapy evaluation in a.m - Also family want to give  patient his home meds from home and do not want hospital meds.   Acute on chronic respiratory failure, pulmonary fibrosis-patient not compliant with home O2.  O2 sats was 85% on nonrebreather in the ED with sedation.  Currently on 2-1/2 L sats  > 97%. -Supplemental O2. -Resume daily prednisone 5 mg  -Resume Plaquenil -May need evaluation for home O2.  Incidental Covid positive-except for transient hypoxia with sedation in ED, patient is otherwise on room air sats 96%.  He has a chronic cough.  Chest x-ray- Diffuse bilateral parenchymal lung disease. Findings are compatible with chronic changes and fibrosis. The degree of lung disease has likely progressed since 2019. Findings could be related to pulmonary fibrosis. Difficult to exclude an acute infectious or inflammatory process.  - Vaccinated X2, with booster. -Family do not want any treatment for patient's Covid infection-steroids or remdesivir.   Paroxysmal atrial fib-currently in sinus.  Not on anticoagulation, follows with cardiologist-Dr. Tamala Julian. -Hold metoprolol for now  Hypertension -became hypotensive with sedatives in the ED. -Hold antihypertensives-metoprolol for now  CAD-history of stent placement.  EKG unremarkable.  No chest pain. -Resume aspirin, statins when able.  Dementia-  -Resume Aricept.  DVT prophylaxis: SCds. Per ortho. Defer DVT ppx. Code Status: DNR, confirmed with wife at bed side. Patient has a most Form.  Family Communication: Spouse at bedside Disposition Plan:   2 days Consults called: Ortho Admission status: Inpt, Tele I certify that at the point of admission it is my clinical judgment that the patient will require inpatient hospital care spanning beyond 2 midnights from the point of admission due to high intensity of service, high risk for further deterioration and high frequency of surveillance required.    Bethena Roys MD [facility]  06/30/2020, 6:26 PM

## 2020-06-30 NOTE — Progress Notes (Signed)
[name], male   DOB: Jun 13, 1930, 87 y.o.   MRN: [id number]  Rt hip dislocation  No current facility-administered medications for this encounter.  Current Outpatient Medications:  .  Ascorbic Acid (VITAMIN C) 500 MG CAPS, Take 1 tablet by mouth daily., Disp: , Rfl:  .  aspirin 81 MG tablet, Take 81 mg by mouth daily., Disp: , Rfl:  .  atorvastatin (LIPITOR) 40 MG tablet, Take 40 mg by mouth daily., Disp: , Rfl:  .  calcium carbonate (OS-CAL) 600 MG TABS, Take 600 mg by mouth daily., Disp: , Rfl:  .  carboxymethylcellulose (REFRESH PLUS) 0.5 % SOLN, 1 drop 3 (three) times daily as needed (dry eyes)., Disp: , Rfl:  .  cholecalciferol (VITAMIN D3) 25 MCG (1000 UNIT) tablet, Take 1 tablet by mouth daily., Disp: , Rfl:  .  donepezil (ARICEPT) 5 MG tablet, Take 5 mg by mouth at bedtime., Disp: , Rfl:  .  DULoxetine (CYMBALTA) 30 MG capsule, Take 30 mg by mouth daily., Disp: , Rfl:  .  famotidine (PEPCID) 40 MG tablet, Take 40 mg by mouth daily., Disp: , Rfl:  .  ferrous sulfate 325 (65 FE) MG tablet, Take 325 mg by mouth daily with breakfast., Disp: , Rfl:  .  hydroxychloroquine (PLAQUENIL) 200 MG tablet, Take 200 mg by mouth daily., Disp: , Rfl:  .  metoprolol tartrate (LOPRESSOR) 25 MG tablet, Take 25 mg by mouth 2 (two) times daily., Disp: , Rfl:  .  Multiple Vitamin (MULTIVITAMIN) capsule, Take 1 capsule by mouth daily., Disp: , Rfl:  .  Multiple Vitamins-Minerals (OCUVITE PRESERVISION PO), Take 1 tablet by mouth daily., Disp: , Rfl:  .  Omega-3 Fatty Acids (FISH OIL) 1000 MG CAPS, Take 1 capsule by mouth daily., Disp: , Rfl:  .  predniSONE (DELTASONE) 5 MG tablet, Take 5-10 mg by mouth daily., Disp: , Rfl:  .  sulfaSALAzine (AZULFIDINE) 500 MG tablet, Take 1,000 mg by mouth 2 (two) times daily. , Disp: , Rfl:  .  tamsulosin (FLOMAX) 0.4 MG CAPS capsule, Take 0.4 mg by mouth daily., Disp: , Rfl:  .  Zinc 50 MG TABS, Take 1 tablet by mouth daily., Disp: , Rfl:  .   nitroGLYCERIN (NITROSTAT) 0.4 MG SL tablet, Place 1 tablet (0.4 mg total) under the tongue every 5 (five) minutes as needed for chest pain., Disp: 25 tablet, Rfl: 3   Past Medical History:  Diagnosis Date  . AAA (abdominal aortic aneurysm) ([place])   . Anemia   . Arthritis    ra  . Atrial fibrillation ([place])   . COPD (chronic obstructive pulmonary disease) ([place]) Jan.2016   Small amount of Emphysema- Pulmonary Fibrosis ( Feb. 9, 2016 )  . Coronary atherosclerosis   . Dysrhythmia    A-Fib  . Hematuria, gross    HISTORY OF  ?  2004  . Hypertension    dr Daneen Schick  . Immune disorder ([place])   . Myocardial infarction ([place]) 2000  . Pulmonary fibrosis ([place])   . Rheumatoid arthritis ([place])   . Shortness of breath    with exertion  . Staph skin infection    Back    Past Surgical History:  Procedure Laterality Date  . ABDOMINAL AORTIC ENDOVASCULAR STENT GRAFT N/A 01/06/2016   Procedure: ABDOMINAL AORTIC ENDOVASCULAR STENT GRAFT;  Surgeon: Waynetta Sandy, MD;  Location: [facility] OR;  Service: Vascular;  Laterality: N/A;  . arm surgery     result of staph infection  .  BACK SURGERY    . CORONARY ANGIOPLASTY    . ESOPHAGOGASTRODUODENOSCOPY  2004    ?  . esophagus wrapped    . EYE SURGERY Bilateral 03-14-13   [place]  . HAND SURGERY     rel to staph  . JOINT REPLACEMENT     bil hips  . JOINT REPLACEMENT Right 01-20-13   Knee  . stents  2000  . TOTAL KNEE ARTHROPLASTY Right 01/20/2013   Procedure: RIGHT TOTAL KNEE ARTHROPLASTY;  Surgeon: Kerin Salen, MD;  Location: [place];  Service: Orthopedics;  Laterality: Right;

## 2020-06-30 NOTE — Anesthesia Postprocedure Evaluation (Signed)
Anesthesia Post Note  Patient: [name]  Procedure(s) Performed: CLOSED REDUCTION HIP (Right Hip)  Patient location during evaluation: Other Anesthesia Type: General Level of consciousness: awake Pain management: pain level controlled Vital Signs Assessment: post-procedure vital signs reviewed and stable Respiratory status: spontaneous breathing and respiratory function stable Cardiovascular status: blood pressure returned to baseline and stable Postop Assessment: no headache and no apparent nausea or vomiting Anesthetic complications: no   No complications documented.   Last Vitals:  Vitals:   06/30/20 1530 06/30/20 1545  BP: 140/72 (!) 109/91  Pulse: 66 66  Resp: 20 18  SpO2: 97% 96%    Last Pain:  Vitals:   06/30/20 1524  PainSc: [place]

## 2020-06-30 NOTE — Anesthesia Preprocedure Evaluation (Signed)
Anesthesia Evaluation  Patient identified by MRN, date of birth, ID band Patient awake    Reviewed: Allergy & Precautions, H&P , NPO status , Patient's Chart, lab work & pertinent test results, reviewed documented beta blocker date and time   Airway Mallampati: II  TM Distance: >3 FB Neck ROM: full    Dental no notable dental hx.    Pulmonary shortness of breath and with exertion, COPD,  COPD inhaler, former smoker,    Pulmonary exam normal breath sounds clear to auscultation       Cardiovascular Exercise Tolerance: Good hypertension, + CAD and + Past MI  + dysrhythmias Atrial Fibrillation  Rhythm:regular Rate:Normal     Neuro/Psych negative neurological ROS  negative psych ROS   GI/Hepatic negative GI ROS, Neg liver ROS,   Endo/Other  negative endocrine ROS  Renal/GU negative Renal ROS  negative genitourinary   Musculoskeletal   Abdominal   Peds  Hematology  (+) Blood dyscrasia, anemia ,   Anesthesia Other Findings GA/LMA, possible muscle relaxant  Reproductive/Obstetrics negative OB ROS                             Anesthesia Physical Anesthesia Plan  ASA: III and emergent  Anesthesia Plan: General and General ETT   Post-op Pain Management:    Induction:   PONV Risk Score and Plan: Propofol infusion  Airway Management Planned:   Additional Equipment:   Intra-op Plan:   Post-operative Plan:   Informed Consent: I have reviewed the patients History and Physical, chart, labs and discussed the procedure including the risks, benefits and alternatives for the proposed anesthesia with the patient or authorized representative who has indicated his/her understanding and acceptance.     Dental Advisory Given  Plan Discussed with: CRNA  Anesthesia Plan Comments:         Anesthesia Quick Evaluation

## 2020-06-30 NOTE — Progress Notes (Signed)
Wife at bedside and requested to give patient the medication she brought from home. MD made aware and stated that was okay. Wife administered Tylenol 500mg  x 2, PreserVision, and Calcium 600mg  in addition to the [facility]. RN in room while medication is administered.

## 2020-06-30 NOTE — ED Triage Notes (Signed)
Pt was mowing his grass, pt was getting off the lawn mower and fell in the yard. C/o of right hip.  Unaware of hitting head.

## 2020-06-30 NOTE — Op Note (Signed)
06/30/2020  5:36 PM  PATIENT:  [name]  87 y.o. male  PRE-OPERATIVE DIAGNOSIS:  dislocation hip right  POST-OPERATIVE DIAGNOSIS:  dislocation hip right  PROCEDURE:  Procedure(s): CLOSED REDUCTION HIP (Right) prosthesis, total hip  Findings Intact prosthesis closed reduction with traction slight adduction and hip flexion with countertraction on the pelvis  EUA showed a stable hip  Details of procedure the patient was brought to the operating room for general anesthesia and succinylcholine relaxation.  After timeout.  The C-arm was brought in and with traction countertraction with the leg in adduction slight hip flexion and then bringing it up into 90 degrees of flexion the hip reduced.  Leg lengths were checked.  Leg lengths were restored back to normal  Exam under anesthesia under fluoroscopy showed the hip to be stable  Patient was taken to the recovery room in stable condition with a knee immobilizer on his leg  The patient has COVID-19 he is positive for antigen and will be admitted for physical therapy.  Appears to be asymptomatic from his COVID-19 positive test   SURGEON:  Surgeon(s) and Role:    * Carole Civil, MD - Primary  PHYSICIAN ASSISTANT:   ASSISTANTS: none   ANESTHESIA:   general   BLOOD ADMINISTERED:none  DRAINS: none   LOCAL MEDICATIONS USED:  none   SPECIMEN:  No Specimen  DISPOSITION OF SPECIMEN:  N/A  COUNTS:  YES    DICTATION: .Dragon Dictation  PLAN OF CARE: Admit for overnight observation  PATIENT DISPOSITION:  PACU - hemodynamically stable.   Delay start of Pharmacological VTE agent (>24hrs) due to surgical blood loss or risk of bleeding: yes

## 2020-06-30 NOTE — Consult Note (Addendum)
[place]   [name], male   DOB: [birth year], 87 y.o.   MRN: [id number]  New patient  Requested by: dr zammit   Reason for: dislocation rt hip prosthesis   Based on the information below I recommend attempt closed reduction in OR   Chief Complaint  Patient presents with  . Fall     HPI  81 male had hip fracture in the 59s repair deteriorated and dr Mayer Camel placed rt tha in 2004/6 Got off his lawmower something popped and he was taken to the er with deformity of his rt leg  ER tried could not reduce and patients sat dropped   hes covid positive !!!   Location pain rt hip Duration today about 6 pm  Severity scale 8 Quality dull Modified by movement makes it worse   Review of Systems (all) just sedated  Review of Systems  Unable to perform ROS: Other    Past Medical History:  Diagnosis Date  . AAA (abdominal aortic aneurysm) ([place])   . Anemia   . Arthritis    ra  . Atrial fibrillation ([place])   . COPD (chronic obstructive pulmonary disease) ([place]) Jan.2016   Small amount of Emphysema- Pulmonary Fibrosis ( Feb. 9, 2016 )  . Coronary atherosclerosis   . Dysrhythmia    A-Fib  . Hematuria, gross    HISTORY OF  ?  2004  . Hypertension    dr Daneen Schick  . Immune disorder ([place])   . Myocardial infarction ([place]) 2000  . Pulmonary fibrosis ([place])   . Rheumatoid arthritis ([place])   . Shortness of breath    with exertion  . Staph skin infection    Back    Past Surgical History:  Procedure Laterality Date  . ABDOMINAL AORTIC ENDOVASCULAR STENT GRAFT N/A 01/06/2016   Procedure: ABDOMINAL AORTIC ENDOVASCULAR STENT GRAFT;  Surgeon: Waynetta Sandy, MD;  Location: [facility] OR;  Service: Vascular;  Laterality: N/A;  . arm surgery     result of staph infection  . BACK SURGERY    . CORONARY ANGIOPLASTY    . ESOPHAGOGASTRODUODENOSCOPY  2004    ?  . esophagus wrapped    . EYE SURGERY Bilateral 03-14-13   [place]  . HAND  SURGERY     rel to staph  . JOINT REPLACEMENT     bil hips  . JOINT REPLACEMENT Right 01-20-13   Knee  . stents  2000  . TOTAL KNEE ARTHROPLASTY Right 01/20/2013   Procedure: RIGHT TOTAL KNEE ARTHROPLASTY;  Surgeon: Kerin Salen, MD;  Location: [facility];  Service: Orthopedics;  Laterality: Right;    Family History  Problem Relation Age of Onset  . Heart disease Brother        Heart Disease before age 30  . Stroke Brother   . Heart attack Brother   . Hypertension Brother   . Diabetes Son   . Heart disease Son        Heart Disease before age 41  . Hypertension Son   . Heart attack Son   . Bleeding Disorder Father   . COPD Father        Emphysema  . Varicose Veins Sister   . COPD Sister    Social History   Tobacco Use  . Smoking status: Former Smoker    Packs/day: 3.00    Years: 50.00    Pack years: 150.00    Types: Cigarettes    Quit date:  03/27/1978    Years since quitting: 42.2  . Smokeless tobacco: Former Systems developer    Types: Chew  Substance Use Topics  . Alcohol use: No    Alcohol/week: 0.0 standard drinks  . Drug use: No   Allergies  Allergen Reactions  . Morphine Other (See Comments)    "makes me crazy"  . Morphine And Related Nausea And Vomiting and Other (See Comments)    Hallucinations    No current facility-administered medications for this encounter.  Current Outpatient Medications:  .  Ascorbic Acid (VITAMIN C) 500 MG CAPS, Take 1 tablet by mouth daily., Disp: , Rfl:  .  aspirin 81 MG tablet, Take 81 mg by mouth daily., Disp: , Rfl:  .  atorvastatin (LIPITOR) 40 MG tablet, Take 40 mg by mouth daily., Disp: , Rfl:  .  calcium carbonate (OS-CAL) 600 MG TABS, Take 600 mg by mouth daily., Disp: , Rfl:  .  carboxymethylcellulose (REFRESH PLUS) 0.5 % SOLN, 1 drop 3 (three) times daily as needed (dry eyes)., Disp: , Rfl:  .  cholecalciferol (VITAMIN D3) 25 MCG (1000 UNIT) tablet, Take 1 tablet by mouth daily., Disp: , Rfl:  .  donepezil (ARICEPT) 5 MG tablet,  Take 5 mg by mouth at bedtime., Disp: , Rfl:  .  DULoxetine (CYMBALTA) 30 MG capsule, Take 30 mg by mouth daily., Disp: , Rfl:  .  famotidine (PEPCID) 40 MG tablet, Take 40 mg by mouth daily., Disp: , Rfl:  .  ferrous sulfate 325 (65 FE) MG tablet, Take 325 mg by mouth daily with breakfast., Disp: , Rfl:  .  hydroxychloroquine (PLAQUENIL) 200 MG tablet, Take 200 mg by mouth daily., Disp: , Rfl:  .  metoprolol tartrate (LOPRESSOR) 25 MG tablet, Take 25 mg by mouth 2 (two) times daily., Disp: , Rfl:  .  Multiple Vitamin (MULTIVITAMIN) capsule, Take 1 capsule by mouth daily., Disp: , Rfl:  .  Multiple Vitamins-Minerals (OCUVITE PRESERVISION PO), Take 1 tablet by mouth daily., Disp: , Rfl:  .  Omega-3 Fatty Acids (FISH OIL) 1000 MG CAPS, Take 1 capsule by mouth daily., Disp: , Rfl:  .  predniSONE (DELTASONE) 5 MG tablet, Take 5-10 mg by mouth daily., Disp: , Rfl:  .  sulfaSALAzine (AZULFIDINE) 500 MG tablet, Take 1,000 mg by mouth 2 (two) times daily. , Disp: , Rfl:  .  tamsulosin (FLOMAX) 0.4 MG CAPS capsule, Take 0.4 mg by mouth daily., Disp: , Rfl:  .  Zinc 50 MG TABS, Take 1 tablet by mouth daily., Disp: , Rfl:  .  nitroGLYCERIN (NITROSTAT) 0.4 MG SL tablet, Place 1 tablet (0.4 mg total) under the tongue every 5 (five) minutes as needed for chest pain., Disp: 25 tablet, Rfl: 3    Physical Exam(=30) BP (!) 109/91   Pulse 66   Resp 18   SpO2 96%   Gen. Appearance no major abnorms, except rt lower extrem Peripheral vascular system no edema  Lymph nodes ARE NORMAL  Gait can not walk   Left Upper extremity  Inspection revealed no malalignment or asymmetry all joints have rheumatologic swelling in the hands   Assessment of range of motion: Full range of motion was recorded  Assessment of stability: Elbow wrist and hand and shoulder were stable  Assessment of muscle strength and tone revealed grade 5 muscle strength and normal muscle tone  Skin was normal without rash lesion or  ulceration  Right upper extremity  Inspection revealed no malalignment or asymmetry all joints have  rheumatologic swelling in the hands   Assessment of range of motion: Full range of motion was recorded  Assessment of stability: Elbow wrist and hand and shoulder were stable  Assessment of muscle strength and tone revealed grade 5 muscle strength and normal muscle tone  Skin was normal without rash lesion or ulceration  Right Lower extremity  Inspection revealed  malalignment and asymmetry   rom stability strength deferred but he can move his foot and toes   Skin was normal without rash lesion or ulceration  Left lower extremity Inspection revealed no malalignment or asymmetry Assessment of range of motion: Full range of motion was recorded Assessment of stability: Ankle, knee and hip were stable Assessment of muscle strength and tone revealed grade 5 muscle strength and normal muscle tone Skin was normal without rash lesion or ulceration  Coordination was tested by finger-to-nose nose and was normal Deep tendon reflexes were 2+ in the upper extremities and 2+ in the lower extremities Examination of sensation by touch was normal  Mental status  Oriented to time person and place normal  Mood and affect normal without depression anxiety or agitation  Dx:   Data Reviewed  ER RECORD REVIEWED: CONFIRMS HISTORY   I reviewed the following images and the reports and my independent interpretation is  Ap pelvis   2 hip prostheses rt is dislocated   Post reduction film : still dislocated    Assessment Rt tha dislocation Closed reduction in er failed with hypoxia.   Plan  The procedure has been fully reviewed with the patient; The risks and benefits of surgery have been discussed and explained and understood. Alternative treatment has also been reviewed, questions were encouraged and answered. The postoperative plan is also been reviewed.  Closed reduction in OR , rt tha  prosthesis    Carole Civil MD

## 2020-06-30 NOTE — Transfer of Care (Signed)
Immediate Anesthesia Transfer of Care Note  Patient: Unknown Jim  Procedure(s) Performed: CLOSED REDUCTION HIP (Right Hip)  Patient Location: OR  Anesthesia Type:General  Level of Consciousness: awake  Airway & Oxygen Therapy: Patient Spontanous Breathing and Patient connected to nasal cannula oxygen  Post-op Assessment: Report given to RN and Post -op Vital signs reviewed and stable  Post vital signs: Reviewed and stable  Last Vitals:  Vitals Value Taken Time  BP    Temp    Pulse    Resp    SpO2      Last Pain:  Vitals:   06/30/20 1524  PainSc: 4          Complications: No complications documented.

## 2020-06-30 NOTE — Brief Op Note (Signed)
06/30/2020  5:36 PM  PATIENT:  Unknown Jim  87 y.o. male  PRE-OPERATIVE DIAGNOSIS:  dislocation hip right  POST-OPERATIVE DIAGNOSIS:  dislocation hip right  PROCEDURE:  Procedure(s): CLOSED REDUCTION HIP (Right) prosthesis, total hip  Findings Intact prosthesis closed reduction with traction slight adduction and hip flexion with countertraction on the pelvis  EUA showed a stable hip  Details of procedure the patient was brought to the operating room for general anesthesia and succinylcholine relaxation.  After timeout.  The C-arm was brought in and with traction countertraction with the leg in adduction slight hip flexion and then bringing it up into 90 degrees of flexion the hip reduced.  Leg lengths were checked.  Leg lengths were restored back to normal  Exam under anesthesia under fluoroscopy showed the hip to be stable  Patient was taken to the recovery room in stable condition with a knee immobilizer on his leg  The patient has COVID-19 he is positive for antigen and will be admitted for physical therapy.  Appears to be asymptomatic from his COVID-19 positive test   SURGEON:  Surgeon(s) and Role:    * Carole Civil, MD - Primary  PHYSICIAN ASSISTANT:   ASSISTANTS: none   ANESTHESIA:   general  EBL:  none   BLOOD ADMINISTERED:none  DRAINS: none   LOCAL MEDICATIONS USED:  none   SPECIMEN:  No Specimen  DISPOSITION OF SPECIMEN:  N/A  COUNTS:  YES  TOURNIQUET:  * No tourniquets in log *  DICTATION: .Dragon Dictation  PLAN OF CARE: Admit for overnight observation  PATIENT DISPOSITION:  PACU - hemodynamically stable.   Delay start of Pharmacological VTE agent (>24hrs) due to surgical blood loss or risk of bleeding: yes

## 2020-06-30 NOTE — ED Provider Notes (Addendum)
[facility] EMERGENCY DEPARTMENT Provider Note   CSN: [id number] Arrival date & time: 06/30/20  1124     History Chief Complaint  Patient presents with  . Fall    [name] is a 87 y.o. male.  HPI   Patient with significant medical history of AAA, A. fib, COPD, CAD, hypertension bilateral hip replacements presents with chief complaint of fall.  He endorses today he was mowing his lawn and he had to use the bathroom so he got off the tractor and went to the woods. While he was urinating he felt a pop in his right hip and then he fell onto his right side.  He denies hitting his head, losing conscious, is not on anticoagulant.  He endorses that he has severe right sided hip pain, unable to apply any weight to it, states it hurts when he stretches out his right hip.  He states he was unable to get himself up off the ground and was  down for about 30 minutes before EMS came and got him.  Patient denies any alleviating factors.  Patient denies headaches, fevers, chills, shortness of breath, chest pain, abdominal pain, nausea, vomiting, diarrhea, worsening pedal edema.  Past Medical History:  Diagnosis Date  . AAA (abdominal aortic aneurysm) ([place])   . Anemia   . Arthritis    ra  . Atrial fibrillation ([place])   . COPD (chronic obstructive pulmonary disease) ([place]) Jan.2016   Small amount of Emphysema- Pulmonary Fibrosis ( Feb. 9, 2016 )  . Coronary atherosclerosis   . Dysrhythmia    A-Fib  . Hematuria, gross    HISTORY OF  ?  2004  . Hypertension    dr Daneen Schick  . Immune disorder ([place])   . Myocardial infarction ([place]) 2000  . Pulmonary fibrosis ([place])   . Rheumatoid arthritis ([place])   . Shortness of breath    with exertion  . Staph skin infection    Back    Patient Active Problem List   Diagnosis Date Noted  . Hip dislocation, right ([place]) 06/30/2020  . Abdominal aortic aneurysm without rupture ([place]) 01/06/2016  . Essential hypertension 11/04/2014  . Pulmonary fibrosis  assoc with RA  04/07/2014  . CAD (coronary artery disease), native coronary artery 02/26/2014  . Hyperlipidemia 02/26/2014  . Paroxysmal atrial fibrillation ([place]) 02/26/2014  . Arthritis of knee, right 01/20/2013  . 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 MSSA (methicillin susceptible Staphylococcus aureus) infection 08/02/2010    Past Surgical History:  Procedure Laterality Date  . ABDOMINAL AORTIC ENDOVASCULAR STENT GRAFT N/A 01/06/2016   Procedure: ABDOMINAL AORTIC ENDOVASCULAR STENT GRAFT;  Surgeon: Waynetta Sandy, MD;  Location: [facility] OR;  Service: Vascular;  Laterality: N/A;  . arm surgery     result of staph infection  . BACK SURGERY    . CORONARY ANGIOPLASTY    . ESOPHAGOGASTRODUODENOSCOPY  2004    ?  . esophagus wrapped    . EYE SURGERY Bilateral 03-14-13   [place]  . HAND SURGERY     rel to staph  . JOINT REPLACEMENT     bil hips  . JOINT REPLACEMENT Right 01-20-13   Knee  . stents  2000  . TOTAL KNEE ARTHROPLASTY Right 01/20/2013   Procedure: RIGHT TOTAL KNEE ARTHROPLASTY;  Surgeon: Kerin Salen, MD;  Location: [place];  Service: Orthopedics;  Laterality: Right;       Family History  Problem Relation Age of Onset  . Heart disease Brother  Heart Disease before age 35  . Stroke Brother   . Heart attack Brother   . Hypertension Brother   . Diabetes Son   . Heart disease Son        Heart Disease before age 85  . Hypertension Son   . Heart attack Son   . Bleeding Disorder Father   . COPD Father        Emphysema  . Varicose Veins Sister   . COPD Sister     Social History   Tobacco Use  . Smoking status: Former Smoker    Packs/day: 3.00    Years: 50.00    Pack years: 150.00    Types: Cigarettes    Quit date: 03/27/1978    Years since quitting: 42.2  . Smokeless tobacco: Former Systems developer    Types: Chew  Substance Use Topics  . Alcohol use: No    Alcohol/week: 0.0 standard drinks  . Drug use: No    Home Medications Prior to Admission medications    Medication Sig Start Date End Date Taking? Authorizing Provider  Ascorbic Acid (VITAMIN C) 500 MG CAPS Take 1 tablet by mouth daily.   Yes [provider]  aspirin 81 MG tablet Take 81 mg by mouth daily.   Yes [provider]  atorvastatin (LIPITOR) 40 MG tablet Take 40 mg by mouth daily.   Yes [provider]  calcium carbonate (OS-CAL) 600 MG TABS Take 600 mg by mouth daily.   Yes [provider]  carboxymethylcellulose (REFRESH PLUS) 0.5 % SOLN 1 drop 3 (three) times daily as needed (dry eyes).   Yes [provider]  cholecalciferol (VITAMIN D3) 25 MCG (1000 UNIT) tablet Take 1 tablet by mouth daily.   Yes [provider]  donepezil (ARICEPT) 5 MG tablet Take 5 mg by mouth at bedtime.   Yes [provider]  DULoxetine (CYMBALTA) 30 MG capsule Take 30 mg by mouth daily.   Yes [provider]  famotidine (PEPCID) 40 MG tablet Take 40 mg by mouth daily. 10/11/18  Yes [provider]  ferrous sulfate 325 (65 FE) MG tablet Take 325 mg by mouth daily with breakfast.   Yes [provider]  hydroxychloroquine (PLAQUENIL) 200 MG tablet Take 200 mg by mouth daily.   Yes [provider]  metoprolol tartrate (LOPRESSOR) 25 MG tablet Take 25 mg by mouth 2 (two) times daily.   Yes [provider]  Multiple Vitamin (MULTIVITAMIN) capsule Take 1 capsule by mouth daily.   Yes [provider]  Multiple Vitamins-Minerals (OCUVITE PRESERVISION PO) Take 1 tablet by mouth daily.   Yes [provider]  Omega-3 Fatty Acids (FISH OIL) 1000 MG CAPS Take 1 capsule by mouth daily.   Yes [provider]  predniSONE (DELTASONE) 5 MG tablet Take 5-10 mg by mouth daily.   Yes [provider]  sulfaSALAzine (AZULFIDINE) 500 MG tablet Take 1,000 mg by mouth 2 (two) times daily.    Yes [provider]  tamsulosin (FLOMAX) 0.4 MG CAPS capsule Take 0.4 mg by mouth daily.   Yes  [provider]  Zinc 50 MG TABS Take 1 tablet by mouth daily.   Yes [provider]  nitroGLYCERIN (NITROSTAT) 0.4 MG SL tablet Place 1 tablet (0.4 mg total) under the tongue every 5 (five) minutes as needed for chest pain. 02/27/14   Belva Crome, MD    Allergies    Morphine and Morphine and related  Review of  Systems   Review of Systems  Constitutional: Negative for chills and fever.  HENT: Negative for congestion.   Respiratory: Negative for shortness of breath.   Cardiovascular: Negative for chest pain.  Gastrointestinal: Negative for abdominal pain, diarrhea, nausea and vomiting.  Genitourinary: Negative for enuresis.  Musculoskeletal: Negative for back pain.       Right hip pain.  Skin: Negative for rash.  Neurological: Negative for dizziness.  Hematological: Does not bruise/bleed easily.    Physical Exam Updated Vital Signs BP (!) 109/91   Pulse 66   Resp 18   SpO2 96%   Physical Exam Vitals and nursing note reviewed.  Constitutional:      General: He is not in acute distress.    Appearance: He is not ill-appearing.  HENT:     Head: Normocephalic and atraumatic.     Nose: No congestion.  Eyes:     Conjunctiva/sclera: Conjunctivae normal.  Cardiovascular:     Rate and Rhythm: Normal rate and regular rhythm.     Pulses: Normal pulses.     Heart sounds: No murmur heard. No friction rub. No gallop.   Pulmonary:     Effort: No respiratory distress.     Breath sounds: No wheezing, rhonchi or rales.  Abdominal:     Palpations: Abdomen is soft.     Tenderness: There is no abdominal tenderness.  Musculoskeletal:        General: Tenderness and deformity present.     Comments: Patient's lower extremities were visualized patient's right leg was internally rotated and shortened, noted deformity at his right hip, he was tender to palpation.  Neurovascular fully intact.  Skin:    General: Skin is warm and dry.  Neurological:     Mental Status: He  is alert.  Psychiatric:        Mood and Affect: Mood normal.     ED Results / Procedures / Treatments   Labs (all labs ordered are listed, but only abnormal results are displayed) Labs Reviewed  RESP PANEL BY RT-PCR (FLU A&B, COVID) ARPGX2 - Abnormal; Notable for the following components:      Result Value   SARS Coronavirus 2 by RT PCR POSITIVE (*)    All other components within normal limits  CBC WITH DIFFERENTIAL/PLATELET - Abnormal; Notable for the following components:   RBC 3.87 (*)    Hemoglobin 12.8 (*)    MCV 103.6 (*)    Neutro Abs 7.9 (*)    All other components within normal limits  BASIC METABOLIC PANEL    EKG None  Radiology DG Pelvis Portable  Result Date: 06/30/2020 CLINICAL DATA:  Pain EXAM: PORTABLE PELVIS 1-2 VIEWS COMPARISON:  May 27, 2018 FINDINGS: Frontal pelvis image obtained. Total hip replacements on each side. On frontal view, prosthetic components on the left appear well seated. On the right, there is questionable dislocation; the femoral component of the prosthesis appears somewhat laterally positioned compared to the acetabular component, although the femoral component overlies the acetabular component. No fracture evident. There is underlying osteoporosis. There are stents in each iliac artery. IMPRESSION: Total hip replacements bilaterally. A degree of dislocation the right cannot be excluded as noted; advise lateral image of the right hip for further assessment in this regard. Prosthetic components on the left appear well-seated on frontal view. Underlying osteoporosis.  No fracture evident. Electronically Signed   By: Lowella Grip III M.D.   On: 06/30/2020 12:46   DG Chest Portable 1 View  Result Date:  06/30/2020 CLINICAL DATA:  Shortness of breath. EXAM: PORTABLE CHEST 1 VIEW COMPARISON:  12/25/2017 FINDINGS: Diffuse coarse lung densities compatible with chronic changes and pulmonary fibrosis. The degree of parenchymal lung disease has progressed  since 2019. Difficult to evaluate the cardiac silhouette due to the adjacent lung disease. Atherosclerotic calcifications at the aortic arch. IMPRESSION: Diffuse bilateral parenchymal lung disease. Findings are compatible with chronic changes and fibrosis. The degree of lung disease has likely progressed since 2019. Findings could be related to pulmonary fibrosis. Difficult to exclude an acute infectious or inflammatory process. Electronically Signed   By: Markus Daft M.D.   On: 06/30/2020 16:11   DG HIP UNILAT WITH PELVIS 1V RIGHT  Result Date: 06/30/2020 CLINICAL DATA:  Postreduction x-ray EXAM: DG HIP (WITH OR WITHOUT PELVIS) 1V RIGHT COMPARISON:  None. FINDINGS: Generalized osteopenia. Right total hip arthroplasty. Persistent superior posterior right hip dislocation. Old healed right femoral mid diaphyseal fracture. IMPRESSION: 1. Persistent superior posterior right hip dislocation. Electronically Signed   By: Kathreen Devoid   On: 06/30/2020 14:27   DG Hip Unilat With Pelvis 2-3 Views Right  Result Date: 06/30/2020 CLINICAL DATA:  Hip dislocation EXAM: DG HIP (WITH OR WITHOUT PELVIS) 2-3V RIGHT COMPARISON:  None. FINDINGS: Right total hip prosthesis. Posterior dislocation of the femoral head component relative to the acetabular cup on the cross-table lateral view. Suspected left hip arthroplasty. No acute fracture is seen.  Old right femoral shaft fracture. Iliac stents. IMPRESSION: Posterior dislocation of the right hip prosthesis, as above. Electronically Signed   By: Julian Hy M.D.   On: 06/30/2020 13:33    Procedures Reduction of dislocation  Date/Time: 06/30/2020 2:31 PM Performed by: Marcello Fennel, PA-C Authorized by: Marcello Fennel, PA-C  Consent: Verbal consent obtained. Written consent obtained. Risks and benefits: risks, benefits and alternatives were discussed Consent given by: patient Patient identity confirmed: verbally with patient and arm band Time out: Immediately  prior to procedure a "time out" was called to verify the correct patient, procedure, equipment, support staff and site/side marked as required. Local anesthesia used: no  Anesthesia: Local anesthesia used: no  Sedation: Patient sedated: yes Sedation type: moderate (conscious) sedation Sedatives: propofol Vitals: Vital signs were monitored during sedation.  Patient tolerance: patient tolerated the procedure well with no immediate complications Comments: Patient's hip remains dislocated  .Sedation  Date/Time: 06/30/2020 6:00 PM Performed by: Marcello Fennel, PA-C Authorized by: Marcello Fennel, PA-C   Consent:    Consent obtained:  Written   Consent given by:  Patient   Risks discussed:  Allergic reaction, dysrhythmia, inadequate sedation, nausea, prolonged hypoxia resulting in organ damage, prolonged sedation necessitating reversal, respiratory compromise necessitating ventilatory assistance and intubation and vomiting   Alternatives discussed:  Analgesia without sedation, anxiolysis and regional anesthesia Universal protocol:    Immediately prior to procedure, a time out was called: yes     Patient identity confirmed:  Arm band and verbally with patient Indications:    Procedure performed:  Dislocation reduction   Procedure necessitating sedation performed by:  Different physician Pre-sedation assessment:    Time since last food or drink:  4 hours   ASA classification: class 2 - patient with mild systemic disease     Mallampati score:  I - soft palate, uvula, fauces, pillars visible   Neck mobility: normal     Pre-sedation assessments completed and reviewed: pre-procedure airway patency not reviewed, pre-procedure cardiovascular function not reviewed, pre-procedure hydration status not reviewed, pre-procedure mental status  not reviewed, pre-procedure nausea and vomiting status not reviewed, pre-procedure pain level not reviewed, pre-procedure respiratory function not  reviewed and pre-procedure temperature not reviewed   Immediate pre-procedure details:    Reassessment: Patient reassessed immediately prior to procedure     Reviewed: vital signs and relevant labs/tests     Verified: bag valve mask available, emergency equipment available, intubation equipment available, IV patency confirmed, oxygen available, reversal medications available and suction available   Procedure details (see MAR for exact dosages):    Preoxygenation:  Nasal cannula   Sedation:  Propofol   Intended level of sedation: moderate (conscious sedation)   Intra-procedure monitoring:  Blood pressure monitoring, cardiac monitor, continuous pulse oximetry and frequent vital sign checks   Intra-procedure events: none     Intra-procedure management:  Supplemental oxygen   Total Provider sedation time (minutes):  10 Post-procedure details:    Attendance: Constant attendance by certified staff until patient recovered     Recovery: Patient returned to pre-procedure baseline     Post-sedation assessments completed and reviewed: post-procedure airway patency not reviewed, post-procedure cardiovascular function not reviewed, post-procedure mental status not reviewed, post-procedure nausea and vomiting status not reviewed, pain score not reviewed and post-procedure respiratory function not reviewed     Patient is stable for discharge or admission: yes     Procedure completion:  Tolerated well, no immediate complications     Medications Ordered in ED Medications  fentaNYL (SUBLIMAZE) injection 50 mcg (50 mcg Intravenous Given 06/30/20 1233)  propofol (DIPRIVAN) 10 mg/mL bolus/IV push 40 mg (40 mg Intravenous Given 06/30/20 1340)  propofol (DIPRIVAN) 10 mg/mL bolus/IV push (20 mg Intravenous Given 06/30/20 1347)  fentaNYL (SUBLIMAZE) injection 50 mcg (50 mcg Intravenous Given 06/30/20 1453)    ED Course  I have reviewed the triage vital signs and the nursing notes.  Pertinent labs & imaging results  that were available during my care of the patient were reviewed by me and considered in my medical decision making (see chart for details).    MDM Rules/Calculators/A&P                         Initial impression-patient presents with right hip pain after a fall.  He is alert, does not appear in acute distress, vital signs reassuring.  Concern for hip dislocation will obtain imaging, basic lab work, and reassess  Work-up-CBC shows slight macrocytic anemia with a hemoglobin 12.8.  BMP unremarkable.  X-ray of hip reveals posterior dislocation of the right prosthetic hip.  Reassessment will recommend conscious sedation will and hip reduction, patient was agreeable this risks and benefits were explained patient was in agreement and understood.  Dr. Roderic Palau, respiratory therapy were present during reduction which was unsuccessful.  Patient became hypotensive and hypoxic during sedation.  Vital signs are stable at this time will defer second attempt to reduce the hip and speak with orthopedic surgery for further recommendations.  Consult spoke with Dr. Aline Brochure of orthopedic surgery he will speak with anesthesia, as long as they feel patient is stable for surgery he will go to the OR tonight for reduction of right hip.  Anesthesia has accepted the patient, Dr. Aline Brochure will take the patient in the OR tonight, he would like patient mid to medicine due to severity of his chronic diseases.  Spoke with Dr. Denton Brick of the hospitalist team, she is except the patient cannot evaluate.  Rule out- low suspicion for intracranial head bleed or mass as patient denies  headaches, change in vision, paresthesias or weakness in the upper or lower extremities, no focal deficits on my exam.  Low suspicion for spinal cord abnormality or spinal fracture as spine was palpated nontender to palpation, patient is moving all 4 extremities.  Low suspicion for rib fracture or pneumothorax is ribs were nontender to palpation, lung  sounds are clear bilaterally.  Low suspicion for fracture of the pelvis or femur as imaging is negative.  Plan-patient has a dislocation of the right hip, will go to the OR tonight for reduction.  Admit to medicine for further observation.  Final Clinical Impression(s) / ED Diagnoses Final diagnoses:  Fall, initial encounter  Closed dislocation of right hip, initial encounter [facility])    Rx / DC Orders ED Discharge Orders    None       Aron Baba 06/30/20 1658    Milton Ferguson, MD 07/02/20 1332    Marcello Fennel, PA-C 07/14/20 1014    Milton Ferguson, MD 07/20/20 1216

## 2020-06-30 NOTE — Sedation Documentation (Signed)
Hip placed back in place

## 2020-07-01 ENCOUNTER — Encounter (HOSPITAL_COMMUNITY): Payer: Self-pay | Admitting: Orthopedic Surgery

## 2020-07-01 DIAGNOSIS — Z96649 Presence of unspecified artificial hip joint: Secondary | ICD-10-CM | POA: Diagnosis not present

## 2020-07-01 DIAGNOSIS — J8410 Pulmonary fibrosis, unspecified: Secondary | ICD-10-CM | POA: Diagnosis not present

## 2020-07-01 DIAGNOSIS — I2510 Atherosclerotic heart disease of native coronary artery without angina pectoris: Secondary | ICD-10-CM

## 2020-07-01 DIAGNOSIS — S73004A Unspecified dislocation of right hip, initial encounter: Secondary | ICD-10-CM | POA: Diagnosis not present

## 2020-07-01 DIAGNOSIS — T84029D Dislocation of unspecified internal joint prosthesis, subsequent encounter: Secondary | ICD-10-CM

## 2020-07-01 LAB — CBC
HCT: 37.6 % — ABNORMAL LOW (ref 39.0–52.0)
Hemoglobin: 12.1 g/dL — ABNORMAL LOW (ref 13.0–17.0)
MCH: 33.3 pg (ref 26.0–34.0)
MCHC: 32.2 g/dL (ref 30.0–36.0)
MCV: 103.6 fL — ABNORMAL HIGH (ref 80.0–100.0)
Platelets: 200 10*3/uL (ref 150–400)
RBC: 3.63 MIL/uL — ABNORMAL LOW (ref 4.22–5.81)
RDW: 13.8 % (ref 11.5–15.5)
WBC: 9.1 10*3/uL (ref 4.0–10.5)
nRBC: 0 % (ref 0.0–0.2)

## 2020-07-01 LAB — BASIC METABOLIC PANEL
Anion gap: 10 (ref 5–15)
BUN: 16 mg/dL (ref 8–23)
CO2: 24 mmol/L (ref 22–32)
Calcium: 8.9 mg/dL (ref 8.9–10.3)
Chloride: 107 mmol/L (ref 98–111)
Creatinine, Ser: 0.64 mg/dL (ref 0.61–1.24)
GFR, Estimated: >60 mL/min (ref >60)
Glucose, Bld: 92 mg/dL (ref 70–99)
Potassium: 3.8 mmol/L (ref 3.5–5.1)
Sodium: 141 mmol/L (ref 135–145)

## 2020-07-01 MED ORDER — PREDNISONE 20 MG PO TABS: 20.0000 mg | ORAL_TABLET | Freq: Every day | ORAL | Status: DC

## 2020-07-01 MED ORDER — PREDNISONE 20 MG PO TABS
40.0000 mg | ORAL_TABLET | Freq: Every day | ORAL | Status: DC
  Administered 2020-07-01: 40 mg via ORAL

## 2020-07-01 MED ORDER — TRAMADOL HCL 50 MG PO TABS: 50.0000 mg | ORAL_TABLET | Freq: Four times a day (QID) | ORAL | 0 refills | Status: DC

## 2020-07-01 NOTE — Progress Notes (Signed)
Patient's wife was in a hurry to d/c patient. Patient required oxygen. She said that it was going to be delivered to there home. Patient's O2 stat on d/c was 92% on RA.  Removed IV-CDI. Reviewed d/c paperwork with patient and wife. Wife wanted to talk with Case Management about patient's statis for insurance. I contacted case management and they called her. Answered all questions. Wheeled stable patient and belongings to main entrance where he was picked up by his wife and son to d/c to home.

## 2020-07-01 NOTE — Plan of Care (Signed)
  Problem: Education: Goal: Knowledge of General Education information will improve Description: Including pain rating scale, medication(s)/side effects and non-pharmacologic comfort measures 07/01/2020 1238 by Santa Lighter, RN Outcome: Adequate for Discharge 07/01/2020 1237 by Santa Lighter, RN Outcome: Progressing   Problem: Clinical Measurements: Goal: Ability to maintain clinical measurements within normal limits will improve Outcome: Adequate for Discharge Goal: Will remain free from infection Outcome: Adequate for Discharge Goal: Diagnostic test results will improve Outcome: Adequate for Discharge Goal: Respiratory complications will improve Outcome: Adequate for Discharge Goal: Cardiovascular complication will be avoided Outcome: Adequate for Discharge   Problem: Activity: Goal: Risk for activity intolerance will decrease Outcome: Adequate for Discharge   Problem: Nutrition: Goal: Adequate nutrition will be maintained Outcome: Adequate for Discharge   Problem: Coping: Goal: Level of anxiety will decrease Outcome: Adequate for Discharge   Problem: Elimination: Goal: Will not experience complications related to bowel motility Outcome: Adequate for Discharge Goal: Will not experience complications related to urinary retention Outcome: Adequate for Discharge   Problem: Pain Managment: Goal: General experience of comfort will improve Outcome: Adequate for Discharge   Problem: Safety: Goal: Ability to remain free from injury will improve Outcome: Adequate for Discharge   Problem: Skin Integrity: Goal: Risk for impaired skin integrity will decrease Outcome: Adequate for Discharge

## 2020-07-01 NOTE — Discharge Summary (Signed)
Physician Discharge Summary  STOKES RATTIGAN OEU:[id number] DOB: 25-Jan-1931 DOA: 06/30/2020  PCP: Lajean Manes, MD  Admit date: 06/30/2020 Discharge date: 07/01/2020  Admitted From: Home Disposition:  Home   Recommendations for Outpatient Follow-up:  1. Follow up with PCP in 1-2 weeks 2. Please obtain BMP/CBC in one week   Home Health: YES Equipment/Devices: HHPT  Discharge Condition: Stable CODE STATUS: DNR Diet recommendation: Heart Healthy    Brief/Interim Summary: 87 year old male with a history of rheumatoid arthritis, pulmonary fibrosis, COPD with remote tobacco use, paroxysmal atrial fibrillation, hypertension, coronary artery disease, and AAA presenting with right hip pain.  Apparently, the patient was getting off his lawnmower when he felt a pop in his right hip and fell to the ground.  He denied any loss of consciousness or hitting his head.  He was not able to bear weight secondary to pain.  Fall was not witnessed. In the emergency department, no reduction of his right hip was attempted with sedation, but the patient's oxygen saturation dropped to 85% on nonrebreather, and the patient became hypotensive with systolic blood pressure 15/40.  As result, anesthesia and orthopedics was consulted.  The patient was taken to the operating room where closed reduction was performed by Dr. Arther Abbott. Notably, the patient was found to be incidentally Covid positive.  He has been vaccinated x3 for COVID-19.  There is been no recent fevers, chills, chest discomfort, headache, coughing, nausea, vomiting, diarrhea, hemoptysis. Patient spouse states that he follows Dr. Christinia Gully for his pulmonary fibrosis.  The patient was ordered oxygen approximately 4 years ago, but he refused to use it.  Therefore, no oxygen has been set up since then.  The patient follows with Dr. Gavin Pound for his rheumatologic needs. On the morning of 07/01/2020, the patient is awake and alert without any  complaints except some pain in the right hip.  Oxygen saturation was 92-93% on room air.  Physical therapy was consulted.  Home health physical therapy was set up.  Notably, the patient's family do not want any treatment for his COVID-19 infection should the patient qualify from a medical standpoint.  Discharge Diagnoses:  Right hip dislocation -Appreciate orthopedics -Closed reduction performed under general anesthesia 06/30/20 -Physical therapy was consulted and set up for home  COVID-19 infection -The patient was incidentally positive -He has been stable on room air until he was given sedation -He was essentially asymptomatic otherwise -Patient's family was upfront and he refused any treatment should he qualify for treatment medically  Dementia without behavioral disturbance -Continue Aricept  Acute on chronic respiratory failure with hypoxia -This was primarily driven by the patient's sedation and hypoventilation in the setting of his underlying pulm fibrosis -saturation 90-93% on RA -desaturates to 82% with ambulation -set up home oxygen at 2L prior to d/c -He has refused home oxygen in the past  Paroxysmal atrial fibrillation -Remains in sinus rhythm -Follows Dr. Daneen Schick -Continue aspirin -Continue metoprolol titrate -Patient not on any anticoagulation prior to admission  Essential hypertension -Restart metoprolol tartrate as blood pressure has improved  Coronary artery disease -S/p Bare metal stent to RCA in past.    -continue ASA and statin and low dose BB -no chest pain  AAA without rupture -with infrarenal aortic stent graft with persistent type 2 endoleak stable.  followed by Dr. Donzetta Matters.  Stable in 2019.  Rheumatoid arthritis -Continue Plaquenil and prednisone -Continue sulfasalazine     Discharge Instructions  Discharge Instructions    Call MD / Call  911   Complete by: As directed    If you experience chest pain or shortness of breath, CALL 911 and  be transported to the hospital emergency room.  If you develope a fever above 101 F, pus (white drainage) or increased drainage or redness at the wound, or calf pain, call your surgeon's office.   Constipation Prevention   Complete by: As directed    Drink plenty of fluids.  Prune juice may be helpful.  You may use a stool softener, such as Colace (over the counter) 100 mg twice a day.  Use MiraLax (over the counter) for constipation as needed.   Diet - low sodium heart healthy   Complete by: As directed    Discharge instructions   Complete by: As directed    Keep knee brace on for 2 weeks   Use the wlaker when walking   For home use only DME oxygen   Complete by: As directed    Length of Need: Lifetime   Mode or (Route): Nasal cannula   Liters per Minute: 2   Frequency: Continuous (stationary and portable oxygen unit needed)   Oxygen delivery system: Gas   Increase activity slowly as tolerated   Complete by: As directed      Allergies as of 07/01/2020      Reactions   Morphine Other (See Comments)   "makes me crazy"   Morphine And Related Nausea And Vomiting, Other (See Comments)   Hallucinations       Medication List    TAKE these medications   aspirin 81 MG tablet Take 81 mg by mouth daily.   atorvastatin 40 MG tablet Commonly known as: LIPITOR Take 40 mg by mouth daily.   calcium carbonate 600 MG Tabs tablet Commonly known as: OS-CAL Take 600 mg by mouth daily.   carboxymethylcellulose 0.5 % Soln Commonly known as: REFRESH PLUS 1 drop 3 (three) times daily as needed (dry eyes).   cholecalciferol 25 MCG (1000 UNIT) tablet Commonly known as: VITAMIN D3 Take 1 tablet by mouth daily.   donepezil 5 MG tablet Commonly known as: ARICEPT Take 5 mg by mouth at bedtime.   DULoxetine 30 MG capsule Commonly known as: CYMBALTA Take 30 mg by mouth daily.   famotidine 40 MG tablet Commonly known as: PEPCID Take 40 mg by mouth daily.   ferrous sulfate 325 (65 FE) MG  tablet Take 325 mg by mouth daily with breakfast.   Fish Oil 1000 MG Caps Take 1 capsule by mouth daily.   hydroxychloroquine 200 MG tablet Commonly known as: PLAQUENIL Take 200 mg by mouth daily.   metoprolol tartrate 25 MG tablet Commonly known as: LOPRESSOR Take 25 mg by mouth 2 (two) times daily.   multivitamin capsule Take 1 capsule by mouth daily.   nitroGLYCERIN 0.4 MG SL tablet Commonly known as: NITROSTAT Place 1 tablet (0.4 mg total) under the tongue every 5 (five) minutes as needed for chest pain.   OCUVITE PRESERVISION PO Take 1 tablet by mouth daily.   predniSONE 5 MG tablet Commonly known as: DELTASONE Take 5-10 mg by mouth daily.   sulfaSALAzine 500 MG tablet Commonly known as: AZULFIDINE Take 1,000 mg by mouth 2 (two) times daily.   tamsulosin 0.4 MG Caps capsule Commonly known as: FLOMAX Take 0.4 mg by mouth daily.   traMADol 50 MG tablet Commonly known as: ULTRAM Take 1 tablet (50 mg total) by mouth every 6 (six) hours.   Vitamin C 500 MG Caps  Take 1 tablet by mouth daily.   Zinc 50 MG Tabs Take 1 tablet by mouth daily.            Durable Medical Equipment  (From admission, onward)         Start     Ordered   07/01/20 0000  For home use only DME oxygen       Question Answer Comment  Length of Need Lifetime   Mode or (Route) Nasal cannula   Liters per Minute 2   Frequency Continuous (stationary and portable oxygen unit needed)   Oxygen delivery system Gas      07/01/20 0835          Allergies  Allergen Reactions  . Morphine Other (See Comments)    "makes me crazy"  . Morphine And Related Nausea And Vomiting and Other (See Comments)    Hallucinations     Consultations:  ortho   Procedures/Studies: CT HEAD WO CONTRAST  Result Date: 06/30/2020 CLINICAL DATA:  [place] in yard today; history atrial fibrillation, coronary artery disease post MI, hypertension, pulmonary fibrosis, rheumatoid arthritis, former smoker EXAM: CT  HEAD WITHOUT CONTRAST TECHNIQUE: Contiguous axial images were obtained from the base of the skull through the vertex without intravenous contrast. Sagittal and coronal MPR images reconstructed from axial data set. COMPARISON:  None FINDINGS: Brain: Generalized atrophy. Normal ventricular morphology. No midline shift or mass effect. Small vessel chronic ischemic changes of deep cerebral white matter. No intracranial hemorrhage, mass lesion, evidence of acute infarction, or extra-axial fluid collection. Vascular: No hyperdense vessels. Atherosclerotic calcification of internal carotid arteries at skull base Skull: Intact Sinuses/Orbits: Clear Other: N/A IMPRESSION: Atrophy with small vessel chronic ischemic changes of deep cerebral white matter. No acute intracranial abnormalities. Electronically Signed   By: Lavonia Dana M.D.   On: 06/30/2020 20:24   DG Pelvis Portable  Result Date: 06/30/2020 CLINICAL DATA:  Pain EXAM: PORTABLE PELVIS 1-2 VIEWS COMPARISON:  May 27, 2018 FINDINGS: Frontal pelvis image obtained. Total hip replacements on each side. On frontal view, prosthetic components on the left appear well seated. On the right, there is questionable dislocation; the femoral component of the prosthesis appears somewhat laterally positioned compared to the acetabular component, although the femoral component overlies the acetabular component. No fracture evident. There is underlying osteoporosis. There are stents in each iliac artery. IMPRESSION: Total hip replacements bilaterally. A degree of dislocation the right cannot be excluded as noted; advise lateral image of the right hip for further assessment in this regard. Prosthetic components on the left appear well-seated on frontal view. Underlying osteoporosis.  No fracture evident. Electronically Signed   By: Lowella Grip III M.D.   On: 06/30/2020 12:46   DG Chest Portable 1 View  Result Date: 06/30/2020 CLINICAL DATA:  Shortness of breath. EXAM:  PORTABLE CHEST 1 VIEW COMPARISON:  12/25/2017 FINDINGS: Diffuse coarse lung densities compatible with chronic changes and pulmonary fibrosis. The degree of parenchymal lung disease has progressed since 2019. Difficult to evaluate the cardiac silhouette due to the adjacent lung disease. Atherosclerotic calcifications at the aortic arch. IMPRESSION: Diffuse bilateral parenchymal lung disease. Findings are compatible with chronic changes and fibrosis. The degree of lung disease has likely progressed since 2019. Findings could be related to pulmonary fibrosis. Difficult to exclude an acute infectious or inflammatory process. Electronically Signed   By: Markus Daft M.D.   On: 06/30/2020 16:11   DG HIP UNILAT WITH PELVIS 1V RIGHT  Result Date: 06/30/2020 CLINICAL DATA:  Postreduction x-ray EXAM: DG HIP (WITH OR WITHOUT PELVIS) 1V RIGHT COMPARISON:  None. FINDINGS: Generalized osteopenia. Right total hip arthroplasty. Persistent superior posterior right hip dislocation. Old healed right femoral mid diaphyseal fracture. IMPRESSION: 1. Persistent superior posterior right hip dislocation. Electronically Signed   By: Kathreen Devoid   On: 06/30/2020 14:27   DG HIP OPERATIVE UNILAT W OR W/O PELVIS RIGHT  Result Date: 06/30/2020 CLINICAL DATA:  Close reduction of the right hip. EXAM: OPERATIVE RIGHT HIP (WITH PELVIS IF PERFORMED) 1 VIEWS TECHNIQUE: Fluoroscopic spot image(s) were submitted for interpretation post-operatively. COMPARISON:  June 30, 2020. FINDINGS: A single C-arm fluoroscopic image was obtained intraoperatively and submitted for post operative interpretation. This image demonstrates interval reduction of the previously seen hip dislocation. Please see the performing provider's procedural report for further detail. IMPRESSION: Reduction of the previously seen right hip dislocation. Electronically Signed   By: Margaretha Sheffield MD   On: 06/30/2020 17:58   DG Hip Unilat With Pelvis 2-3 Views Right  Result Date:  06/30/2020 CLINICAL DATA:  Hip dislocation EXAM: DG HIP (WITH OR WITHOUT PELVIS) 2-3V RIGHT COMPARISON:  None. FINDINGS: Right total hip prosthesis. Posterior dislocation of the femoral head component relative to the acetabular cup on the cross-table lateral view. Suspected left hip arthroplasty. No acute fracture is seen.  Old right femoral shaft fracture. Iliac stents. IMPRESSION: Posterior dislocation of the right hip prosthesis, as above. Electronically Signed   By: Julian Hy M.D.   On: 06/30/2020 13:33        Discharge Exam: Vitals:   06/30/20 2218 07/01/20 0509  BP: 128/62 108/64  Pulse: 76 71  Resp: 16 20  Temp: 98.4 F (36.9 C) 99.1 F (37.3 C)  SpO2: 90% 97%   Vitals:   06/30/20 1928 06/30/20 2000 06/30/20 2218 07/01/20 0509  BP: (!) 153/73  128/62 108/64  Pulse: 78  76 71  Resp: 20  16 20   Temp: (!) 97.5 F (36.4 C)  98.4 F (36.9 C) 99.1 F (37.3 C)  TempSrc: Oral  Oral Oral  SpO2: 92%  90% 97%  Weight:  76 kg    Height:  6' (1.829 m)      General: Pt is alert, awake, not in acute distress Cardiovascular: RRR, S1/S2 +, no rubs, no gallops Respiratory: Bilateral crackles.  No wheezing. Abdominal: Soft, NT, ND, bowel sounds + Extremities: no edema, no cyanosis   The results of significant diagnostics from this hospitalization (including imaging, microbiology, ancillary and laboratory) are listed below for reference.    Significant Diagnostic Studies: CT HEAD WO CONTRAST  Result Date: 06/30/2020 CLINICAL DATA:  [place] in yard today; history atrial fibrillation, coronary artery disease post MI, hypertension, pulmonary fibrosis, rheumatoid arthritis, former smoker EXAM: CT HEAD WITHOUT CONTRAST TECHNIQUE: Contiguous axial images were obtained from the base of the skull through the vertex without intravenous contrast. Sagittal and coronal MPR images reconstructed from axial data set. COMPARISON:  None FINDINGS: Brain: Generalized atrophy. Normal ventricular  morphology. No midline shift or mass effect. Small vessel chronic ischemic changes of deep cerebral white matter. No intracranial hemorrhage, mass lesion, evidence of acute infarction, or extra-axial fluid collection. Vascular: No hyperdense vessels. Atherosclerotic calcification of internal carotid arteries at skull base Skull: Intact Sinuses/Orbits: Clear Other: N/A IMPRESSION: Atrophy with small vessel chronic ischemic changes of deep cerebral white matter. No acute intracranial abnormalities. Electronically Signed   By: Lavonia Dana M.D.   On: 06/30/2020 20:24   DG Pelvis Portable  Result Date:  06/30/2020 CLINICAL DATA:  Pain EXAM: PORTABLE PELVIS 1-2 VIEWS COMPARISON:  May 27, 2018 FINDINGS: Frontal pelvis image obtained. Total hip replacements on each side. On frontal view, prosthetic components on the left appear well seated. On the right, there is questionable dislocation; the femoral component of the prosthesis appears somewhat laterally positioned compared to the acetabular component, although the femoral component overlies the acetabular component. No fracture evident. There is underlying osteoporosis. There are stents in each iliac artery. IMPRESSION: Total hip replacements bilaterally. A degree of dislocation the right cannot be excluded as noted; advise lateral image of the right hip for further assessment in this regard. Prosthetic components on the left appear well-seated on frontal view. Underlying osteoporosis.  No fracture evident. Electronically Signed   By: Lowella Grip III M.D.   On: 06/30/2020 12:46   DG Chest Portable 1 View  Result Date: 06/30/2020 CLINICAL DATA:  Shortness of breath. EXAM: PORTABLE CHEST 1 VIEW COMPARISON:  12/25/2017 FINDINGS: Diffuse coarse lung densities compatible with chronic changes and pulmonary fibrosis. The degree of parenchymal lung disease has progressed since 2019. Difficult to evaluate the cardiac silhouette due to the adjacent lung disease.  Atherosclerotic calcifications at the aortic arch. IMPRESSION: Diffuse bilateral parenchymal lung disease. Findings are compatible with chronic changes and fibrosis. The degree of lung disease has likely progressed since 2019. Findings could be related to pulmonary fibrosis. Difficult to exclude an acute infectious or inflammatory process. Electronically Signed   By: Markus Daft M.D.   On: 06/30/2020 16:11   DG HIP UNILAT WITH PELVIS 1V RIGHT  Result Date: 06/30/2020 CLINICAL DATA:  Postreduction x-ray EXAM: DG HIP (WITH OR WITHOUT PELVIS) 1V RIGHT COMPARISON:  None. FINDINGS: Generalized osteopenia. Right total hip arthroplasty. Persistent superior posterior right hip dislocation. Old healed right femoral mid diaphyseal fracture. IMPRESSION: 1. Persistent superior posterior right hip dislocation. Electronically Signed   By: Kathreen Devoid   On: 06/30/2020 14:27   DG HIP OPERATIVE UNILAT W OR W/O PELVIS RIGHT  Result Date: 06/30/2020 CLINICAL DATA:  Close reduction of the right hip. EXAM: OPERATIVE RIGHT HIP (WITH PELVIS IF PERFORMED) 1 VIEWS TECHNIQUE: Fluoroscopic spot image(s) were submitted for interpretation post-operatively. COMPARISON:  June 30, 2020. FINDINGS: A single C-arm fluoroscopic image was obtained intraoperatively and submitted for post operative interpretation. This image demonstrates interval reduction of the previously seen hip dislocation. Please see the performing provider's procedural report for further detail. IMPRESSION: Reduction of the previously seen right hip dislocation. Electronically Signed   By: Margaretha Sheffield MD   On: 06/30/2020 17:58   DG Hip Unilat With Pelvis 2-3 Views Right  Result Date: 06/30/2020 CLINICAL DATA:  Hip dislocation EXAM: DG HIP (WITH OR WITHOUT PELVIS) 2-3V RIGHT COMPARISON:  None. FINDINGS: Right total hip prosthesis. Posterior dislocation of the femoral head component relative to the acetabular cup on the cross-table lateral view. Suspected left hip  arthroplasty. No acute fracture is seen.  Old right femoral shaft fracture. Iliac stents. IMPRESSION: Posterior dislocation of the right hip prosthesis, as above. Electronically Signed   By: Julian Hy M.D.   On: 06/30/2020 13:33     Microbiology: Recent Results (from the past 240 hour(s))  Resp Panel by RT-PCR (Flu A&B, Covid) Nasopharyngeal Swab     Status: Abnormal   Collection Time: 06/30/20  3:08 PM   Specimen: Nasopharyngeal Swab; Nasopharyngeal(NP) swabs in vial transport medium  Result Value Ref Range Status   SARS Coronavirus 2 by RT PCR POSITIVE (A) NEGATIVE Final  Comment: RESULT CALLED TO, READ BACK BY AND VERIFIED WITH: HASTINGS,K RN @1638  06/30/20 BILLINGSLEY,L (NOTE) SARS-CoV-2 target nucleic acids are DETECTED.  The SARS-CoV-2 RNA is generally detectable in upper respiratory specimens during the acute phase of infection. Positive results are indicative of the presence of the identified virus, but do not rule out bacterial infection or co-infection with other pathogens not detected by the test. Clinical correlation with patient history and other diagnostic information is necessary to determine patient infection status. The expected result is Negative.  Fact Sheet for Patients: EntrepreneurPulse.com.au  Fact Sheet for Healthcare Providers: IncredibleEmployment.be  This test is not yet approved or cleared by the [place] FDA and  has been authorized for detection and/or diagnosis of SARS-CoV-2 by FDA under an Emergency Use Authorization (EUA).  This EUA will remain in effect (meaning this test  can be used) for the duration of  the COVID-19 declaration under Section 564(b)(1) of the Act, 21 U.S.C. section 360bbb-3(b)(1), unless the authorization is terminated or revoked sooner.     Influenza A by PCR NEGATIVE NEGATIVE Final   Influenza B by PCR NEGATIVE NEGATIVE Final    Comment: (NOTE) The Xpert Xpress  SARS-CoV-2/FLU/RSV plus assay is intended as an aid in the diagnosis of influenza from Nasopharyngeal swab specimens and should not be used as a sole basis for treatment. Nasal washings and aspirates are unacceptable for Xpert Xpress SARS-CoV-2/FLU/RSV testing.  Fact Sheet for Patients: EntrepreneurPulse.com.au  Fact Sheet for Healthcare Providers: IncredibleEmployment.be  This test is not yet approved or cleared by the [place] FDA and has been authorized for detection and/or diagnosis of SARS-CoV-2 by FDA under an Emergency Use Authorization (EUA). This EUA will remain in effect (meaning this test can be used) for the duration of the COVID-19 declaration under Section 564(b)(1) of the Act, 21 U.S.C. section 360bbb-3(b)(1), unless the authorization is terminated or revoked.  Performed at [facility], [address] [place], [place] [address]      Labs: Basic Metabolic Panel: Recent Labs  Lab 06/30/20 1222 07/01/20 0756  NA 143 141  K 3.8 3.8  CL 108 107  CO2 26 24  GLUCOSE 98 92  BUN 19 16  CREATININE 0.74 0.64  CALCIUM 9.0 8.9   Liver Function Tests: No results for input(s): AST, ALT, ALKPHOS, BILITOT, PROT, ALBUMIN in the last 168 hours. No results for input(s): LIPASE, AMYLASE in the last 168 hours. No results for input(s): AMMONIA in the last 168 hours. CBC: Recent Labs  Lab 06/30/20 1222 07/01/20 0756  WBC 9.9 9.1  NEUTROABS 7.9*  --   HGB 12.8* 12.1*  HCT 40.1 37.6*  MCV 103.6* 103.6*  PLT 234 200   Cardiac Enzymes: No results for input(s): CKTOTAL, CKMB, CKMBINDEX, TROPONINI in the last 168 hours. BNP: Invalid input(s): POCBNP CBG: No results for input(s): GLUCAP in the last 168 hours.  Time coordinating discharge:  36 minutes  Signed:  Orson Eva, DO [facility] Pager: [phone] 07/01/2020, 9:01 AM

## 2020-07-01 NOTE — Progress Notes (Signed)
SATURATION QUALIFICATIONS: (This note is used to comply with regulatory documentation for home oxygen)  Patient Saturations on Room Air at Rest = 90  Patient Saturations on Room Air while Ambulating = 82  Patient Saturations on 2 Liters of oxygen while Ambulating = 94  Please briefly explain why patient needs home oxygen: To maintain 02 sat at 90% or above during ambulation.   Orson Eva, DO

## 2020-07-01 NOTE — Progress Notes (Signed)
Pt resting in bed with eyes closed at this. Wife at bedside.

## 2020-07-01 NOTE — Plan of Care (Signed)
  Problem: Education: Goal: Knowledge of General Education information will improve Description Including pain rating scale, medication(s)/side effects and non-pharmacologic comfort measures Outcome: Progressing   Problem: Health Behavior/Discharge Planning: Goal: Ability to manage health-related needs will improve Outcome: Progressing   

## 2020-07-01 NOTE — Clinical Social Work Note (Signed)
Patient in observation needs oxygen and HHPT. Providers discussed with wife. Referrals made to [facility] with Alvis Lemmings for HHPT and [facility] for Oxygen.    Lijah Bourque, Clydene Pugh, LCSW

## 2020-07-01 NOTE — Care Management Obs Status (Signed)
[place] NOTIFICATION   Patient Details  Name: [name] MRN: [id number] Date of Birth: 21-Jul-1930   Medicare Observation Status Notification Given:  Yes    Ihor Gully, LCSW 07/01/2020, 10:31 AM

## 2020-07-01 NOTE — Progress Notes (Signed)
Patient's wife brought home meds. See prior note. Patient's wife was allowed to give home meds last night also. I was in room while medication was administered.

## 2020-07-01 NOTE — Plan of Care (Signed)
  Problem: Acute Rehab PT Goals(only PT should resolve) Goal: Pt Will Go Supine/Side To Sit Outcome: Progressing Flowsheets (Taken 07/01/2020 1214) Pt will go Supine/Side to Sit:  with modified independence  with supervision Goal: Patient Will Transfer Sit To/From Stand Outcome: Progressing Flowsheets (Taken 07/01/2020 1214) Patient will transfer sit to/from stand: with supervision Goal: Pt Will Transfer Bed To Chair/Chair To Bed Outcome: Progressing Flowsheets (Taken 07/01/2020 1214) Pt will Transfer Bed to Chair/Chair to Bed: with supervision Goal: Pt Will Ambulate Outcome: Progressing Flowsheets (Taken 07/01/2020 1214) Pt will Ambulate:  50 feet  with supervision  with rolling walker   12:15 PM, 07/01/20 Lonell Grandchild, MPT Physical Therapist with [facility] [phone] office [phone] mobile phone

## 2020-07-01 NOTE — Care Management CC44 (Signed)
Condition Code 44 Documentation Completed  Patient Details  Name: [name] MRN: [id number] Date of Birth: [birth year]   Condition Code 44 given:  Yes Patient signature on Condition Code 44 notice:  Yes Documentation of 2 MD's agreement:  Yes Code 44 added to claim:  Yes    Ihor Gully, LCSW 07/01/2020, 10:31 AM

## 2020-07-01 NOTE — Evaluation (Addendum)
Physical Therapy Evaluation Patient Details Name: [name] MRN: [id number] DOB: 22-Apr-1930 Today's Date: 07/01/2020   History of Present Illness  [name] is a 87 y.o. male s/p CLOSED REDUCTION HIP (Right) prosthesis, total hip on 06/30/20 with medical history significant for pulmonary fibrosis, COPD, atrial fibrillation, coronary artery disease, dementia.  Patient was brought to the ED with reports of a fall.  Patient was getting off his lawnmower, when he felt a pop in his right hip and subsequently fell to the floor.  He reports he was alert the whole time, did not lose consciousness and did not hit his head.  Fall was not witnessed.  Spouse reports a history of dementia, albeit mild.  Patient denies chest pain, no difficulty breathing.  He reports occasional dizziness on standing, this is chronic.  Patient is supposed to be on home O2, but he does not use it.  Spouse reports at rest patient's O2 sats 9090s, but with exertion it drops to the 80s.    Clinical Impression  Patient demonstrates good return for sitting up at bedside while maintaining posterior hip precautions, very unsteady on feet when attempting gait without AD, required use of RW for safety and able to ambulate in room without loss of balance, but frequent bumping to nearby objects.  Patient on room air with SpO2 dropping from 90% to 82%, put back on 2 LPM O2 after sitting in chair with SpO2 increasing to 94%.   Patient tolerated sitting up in chair after therapy with his spouse present in room.  Patient will benefit from continued physical therapy in hospital and recommended venue below to increase strength, balance, endurance for safe ADLs and gait.  Patient and spouse given instructed in and given written instructions for posterior hip precautions with understanding acknowledged.     Follow Up Recommendations Home health PT;Supervision for mobility/OOB;Supervision - Intermittent    Equipment Recommendations  3in1  (PT)    Recommendations for Other Services       Precautions / Restrictions Precautions Precautions: Posterior Hip Precaution Booklet Issued: Yes (comment) (Handout with hip posterior hip precautions give to family) Required Braces or Orthoses: Knee Immobilizer - Right Knee Immobilizer - Right: Other (comment);On at all times (to prevent bending knee above hip level in bed or sitting) Restrictions Weight Bearing Restrictions: Yes RLE Weight Bearing: Weight bearing as tolerated Other Position/Activity Restrictions: Keep knee immobilizer on at all times to prevent patient from bringing knee higher than hip level      Mobility  Bed Mobility Overal bed mobility: Modified Independent                  Transfers Overall transfer level: Needs assistance Equipment used: Rolling walker (2 wheeled);None;1 person hand held assist Transfers: Sit to/from [facility] Sit to Stand: Min guard Stand pivot transfers: Min guard;Min assist       General transfer comment: unstedy having to lean on nearby objects for support when not using AD, safer using RW  Ambulation/Gait Ambulation/Gait assistance: Min guard;Min assist Gait Distance (Feet): 30 Feet Assistive device: Rolling walker (2 wheeled) Gait Pattern/deviations: Decreased step length - right;Decreased step length - left;Decreased stride length;Antalgic;Steppage;Ataxic Gait velocity: decreased   General Gait Details: demonstrates slow labored cadence with ataxic like gait with limited dorislfexion bilateral feet, no loss of balance, limited secondary to fatigue, on room air with SpO2 dropping from 90% to 82%  Science writer  Modified Rankin (Stroke Patients Only)       Balance Overall balance assessment: Needs assistance Sitting-balance support: Feet supported;No upper extremity supported Sitting balance-Leahy Scale: Good Sitting balance - Comments: seated at EOB    Standing balance support: During functional activity;Bilateral upper extremity supported Standing balance-Leahy Scale: Fair Standing balance comment: using RW                             Pertinent Vitals/Pain Pain Assessment: No/denies pain    Home Living Family/patient expects to be discharged to:: Private residence Living Arrangements: Spouse/significant other Available Help at Discharge: Family;Available 24 hours/day Type of Home: House Home Access: Stairs to enter Entrance Stairs-Rails: Left Entrance Stairs-Number of Steps: 3 Home Layout: One level Home Equipment: Walker - 2 wheels;Wheelchair - manual;Cane - single point      Prior Function Level of Independence: Independent with assistive device(s)         Comments: household ambulator without AD, uses SPC when outside     Hand Dominance   Dominant Hand: Right    Extremity/Trunk Assessment   Upper Extremity Assessment Upper Extremity Assessment: Overall WFL for tasks assessed    Lower Extremity Assessment Lower Extremity Assessment: Generalized weakness;RLE deficits/detail RLE Deficits / Details: grossly 4+/5 RLE: Unable to fully assess due to immobilization RLE Sensation: WNL RLE Coordination: WNL    Cervical / Trunk Assessment Cervical / Trunk Assessment: Kyphotic  Communication   Communication: No difficulties  Cognition Arousal/Alertness: Awake/alert Behavior During Therapy: WFL for tasks assessed/performed Overall Cognitive Status: Within Functional Limits for tasks assessed                                        General Comments      Exercises     Assessment/Plan    PT Assessment Patient needs continued PT services  PT Problem List Decreased strength;Decreased activity tolerance;Decreased balance;Decreased mobility       PT Treatment Interventions DME instruction;Gait training;Stair training;Functional mobility training;Therapeutic activities;Therapeutic  exercise;Patient/family education;Balance training    PT Goals (Current goals can be found in the Care Plan section)  Acute Rehab PT Goals Patient Stated Goal: return home with family to assist PT Goal Formulation: With patient/family Time For Goal Achievement: 07/03/20 Potential to Achieve Goals: Good    Frequency Min 3X/week   Barriers to discharge        Co-evaluation               AM-PAC PT "6 Clicks" Mobility  Outcome Measure Help needed turning from your back to your side while in a flat bed without using bedrails?: A Little Help needed moving from lying on your back to sitting on the side of a flat bed without using bedrails?: None Help needed moving to and from a bed to a chair (including a wheelchair)?: A Little Help needed standing up from a chair using your arms (e.g., wheelchair or bedside chair)?: A Little Help needed to walk in hospital room?: A Lot Help needed climbing 3-5 steps with a railing? : A Lot 6 Click Score: 17    End of Session Equipment Utilized During Treatment: Oxygen Activity Tolerance: Patient tolerated treatment well;Patient limited by fatigue Patient left: in chair;with call bell/phone within reach;with chair alarm set;with family/visitor present Nurse Communication: Mobility status PT Visit Diagnosis: Unsteadiness on feet (R26.81);Other abnormalities of gait and mobility (  R26.89);Muscle weakness (generalized) (M62.81)    Time: 0950-1020 PT Time Calculation (min) (ACUTE ONLY): 30 min   Charges:   PT Evaluation $PT Eval Moderate Complexity: 1 Mod PT Treatments $Therapeutic Activity: 23-37 mins        12:13 PM, 07/01/20 Lonell Grandchild, MPT Physical Therapist with [facility] [phone] office [phone] mobile phone

## 2020-07-06 DIAGNOSIS — J9691 Respiratory failure, unspecified with hypoxia: Secondary | ICD-10-CM | POA: Diagnosis not present

## 2020-07-06 DIAGNOSIS — I10 Essential (primary) hypertension: Secondary | ICD-10-CM | POA: Diagnosis not present

## 2020-07-06 DIAGNOSIS — S73004D Unspecified dislocation of right hip, subsequent encounter: Secondary | ICD-10-CM | POA: Diagnosis not present

## 2020-07-06 DIAGNOSIS — R195 Other fecal abnormalities: Secondary | ICD-10-CM | POA: Diagnosis not present

## 2020-07-06 DIAGNOSIS — I480 Paroxysmal atrial fibrillation: Secondary | ICD-10-CM | POA: Diagnosis not present

## 2020-07-06 DIAGNOSIS — I714 Abdominal aortic aneurysm, without rupture: Secondary | ICD-10-CM | POA: Diagnosis not present

## 2020-07-06 DIAGNOSIS — G301 Alzheimer's disease with late onset: Secondary | ICD-10-CM | POA: Diagnosis not present

## 2020-07-06 DIAGNOSIS — I700 Atherosclerosis of aorta: Secondary | ICD-10-CM | POA: Diagnosis not present

## 2020-07-06 DIAGNOSIS — J8410 Pulmonary fibrosis, unspecified: Secondary | ICD-10-CM | POA: Diagnosis not present

## 2020-07-15 ENCOUNTER — Encounter (HOSPITAL_COMMUNITY): Payer: Self-pay | Admitting: Orthopedic Surgery

## 2020-07-15 DIAGNOSIS — S73004A Unspecified dislocation of right hip, initial encounter: Secondary | ICD-10-CM | POA: Diagnosis not present

## 2020-07-28 DIAGNOSIS — Z1389 Encounter for screening for other disorder: Secondary | ICD-10-CM | POA: Diagnosis not present

## 2020-07-28 DIAGNOSIS — Z0000 Encounter for general adult medical examination without abnormal findings: Secondary | ICD-10-CM | POA: Diagnosis not present

## 2020-07-28 DIAGNOSIS — G301 Alzheimer's disease with late onset: Secondary | ICD-10-CM | POA: Diagnosis not present

## 2020-07-28 DIAGNOSIS — D692 Other nonthrombocytopenic purpura: Secondary | ICD-10-CM | POA: Diagnosis not present

## 2020-07-28 DIAGNOSIS — I10 Essential (primary) hypertension: Secondary | ICD-10-CM | POA: Diagnosis not present

## 2020-07-28 DIAGNOSIS — Z79899 Other long term (current) drug therapy: Secondary | ICD-10-CM | POA: Diagnosis not present

## 2020-07-28 DIAGNOSIS — E7800 Pure hypercholesterolemia, unspecified: Secondary | ICD-10-CM | POA: Diagnosis not present

## 2020-07-28 DIAGNOSIS — I480 Paroxysmal atrial fibrillation: Secondary | ICD-10-CM | POA: Diagnosis not present

## 2020-07-28 DIAGNOSIS — J9611 Chronic respiratory failure with hypoxia: Secondary | ICD-10-CM | POA: Diagnosis not present

## 2020-07-28 DIAGNOSIS — M069 Rheumatoid arthritis, unspecified: Secondary | ICD-10-CM | POA: Diagnosis not present

## 2020-07-28 DIAGNOSIS — Q253 Supravalvular aortic stenosis: Secondary | ICD-10-CM | POA: Diagnosis not present

## 2020-07-28 DIAGNOSIS — J8410 Pulmonary fibrosis, unspecified: Secondary | ICD-10-CM | POA: Diagnosis not present

## 2020-07-31 DIAGNOSIS — J8410 Pulmonary fibrosis, unspecified: Secondary | ICD-10-CM | POA: Diagnosis not present

## 2020-08-13 DIAGNOSIS — M0579 Rheumatoid arthritis with rheumatoid factor of multiple sites without organ or systems involvement: Secondary | ICD-10-CM | POA: Diagnosis not present

## 2020-08-13 DIAGNOSIS — Z79899 Other long term (current) drug therapy: Secondary | ICD-10-CM | POA: Diagnosis not present

## 2020-08-13 DIAGNOSIS — J8410 Pulmonary fibrosis, unspecified: Secondary | ICD-10-CM | POA: Diagnosis not present

## 2020-08-13 DIAGNOSIS — Z6824 Body mass index (BMI) 24.0-24.9, adult: Secondary | ICD-10-CM | POA: Diagnosis not present

## 2020-08-13 DIAGNOSIS — M150 Primary generalized (osteo)arthritis: Secondary | ICD-10-CM | POA: Diagnosis not present

## 2020-08-13 DIAGNOSIS — M2550 Pain in unspecified joint: Secondary | ICD-10-CM | POA: Diagnosis not present

## 2020-08-16 ENCOUNTER — Other Ambulatory Visit: Payer: Self-pay

## 2020-08-16 ENCOUNTER — Encounter (INDEPENDENT_AMBULATORY_CARE_PROVIDER_SITE_OTHER): Payer: Medicare Other | Admitting: Ophthalmology

## 2020-08-16 DIAGNOSIS — Z79899 Other long term (current) drug therapy: Secondary | ICD-10-CM

## 2020-08-16 DIAGNOSIS — H43813 Vitreous degeneration, bilateral: Secondary | ICD-10-CM

## 2020-08-16 DIAGNOSIS — H35033 Hypertensive retinopathy, bilateral: Secondary | ICD-10-CM

## 2020-08-16 DIAGNOSIS — M069 Rheumatoid arthritis, unspecified: Secondary | ICD-10-CM

## 2020-08-16 DIAGNOSIS — I10 Essential (primary) hypertension: Secondary | ICD-10-CM | POA: Diagnosis not present

## 2020-08-16 DIAGNOSIS — H353122 Nonexudative age-related macular degeneration, left eye, intermediate dry stage: Secondary | ICD-10-CM | POA: Diagnosis not present

## 2020-08-29 NOTE — Progress Notes (Signed)
Cardiology Office Note:    Date:  08/30/2020   ID:  [name], DOB 1930/12/30, MRN [id number]  PCP:  Lajean Manes, MD  Cardiologist:  Sinclair Grooms, MD   Referring MD: Lajean Manes, MD   Chief Complaint  Patient presents with  . Coronary Artery Disease    History of Present Illness:    [name] is a 87 y.o. male with a hx of coronary artery disease with bare-metal stent right coronary, abdominal aortic aneurysmrepair with stent graft by Dr. Arlana Hove II endoleak), hypertension,andinterstitial lung/fibrosisdisease,and paroxysmal atrial fibrillation.   Wearing oxygen 24/7.  Has dyspnea on exertion.  Denies orthopnea and PND.  Does not like wearing oxygen continuously but compliance.  Has not had palpitations.  He denies chest pain.  No lower extremity swelling.  No cough or fever recently.  Evaluation of systolic murmur did not reveal significant AAS.  LV function was also noted to be lower normal to mildly depressed.  Past Medical History:  Diagnosis Date  . AAA (abdominal aortic aneurysm) ([place])   . Anemia   . Arthritis    ra  . Atrial fibrillation ([place])   . COPD (chronic obstructive pulmonary disease) ([place]) Jan.2016   Small amount of Emphysema- Pulmonary Fibrosis ( Feb. 9, 2016 )  . Coronary atherosclerosis   . Dysrhythmia    A-Fib  . Hematuria, gross    HISTORY OF  ?  2004  . Hypertension    dr Daneen Schick  . Immune disorder ([place])   . Myocardial infarction ([place]) 2000  . Pulmonary fibrosis ([place])   . Rheumatoid arthritis ([place])   . Shortness of breath    with exertion  . Staph skin infection    Back    Past Surgical History:  Procedure Laterality Date  . ABDOMINAL AORTIC ENDOVASCULAR STENT GRAFT N/A 01/06/2016   Procedure: ABDOMINAL AORTIC ENDOVASCULAR STENT GRAFT;  Surgeon: Waynetta Sandy, MD;  Location: [facility] OR;  Service: Vascular;  Laterality: N/A;  . arm surgery     result of staph infection  . BACK SURGERY    .  CORONARY ANGIOPLASTY    . ESOPHAGOGASTRODUODENOSCOPY  2004    ?  . esophagus wrapped    . EYE SURGERY Bilateral 03-14-13   [place]  . HAND SURGERY     rel to staph  . HIP CLOSED REDUCTION Right 06/30/2020   Procedure: CLOSED REDUCTION HIP;  Surgeon: Carole Civil, MD;  Location: [facility];  Service: Orthopedics;  Laterality: Right;  . JOINT REPLACEMENT     bil hips  . JOINT REPLACEMENT Right 01-20-13   Knee  . stents  2000  . TOTAL KNEE ARTHROPLASTY Right 01/20/2013   Procedure: RIGHT TOTAL KNEE ARTHROPLASTY;  Surgeon: Kerin Salen, MD;  Location: [place];  Service: Orthopedics;  Laterality: Right;    Current Medications: Current Meds  Medication Sig  . Ascorbic Acid (VITAMIN C) 500 MG CAPS Take 1 tablet by mouth daily.  Marland Kitchen aspirin 81 MG tablet Take 81 mg by mouth daily.  Marland Kitchen atorvastatin (LIPITOR) 40 MG tablet Take 40 mg by mouth daily.  . calcium carbonate (OS-CAL) 600 MG TABS Take 600 mg by mouth daily.  . carboxymethylcellulose (REFRESH PLUS) 0.5 % SOLN 1 drop 3 (three) times daily as needed (dry eyes).  . cholecalciferol (VITAMIN D3) 25 MCG (1000 UNIT) tablet Take 1 tablet by mouth daily.  . DULoxetine (CYMBALTA) 30 MG capsule Take 30 mg by mouth daily.  . famotidine (PEPCID)  40 MG tablet Take 40 mg by mouth daily.  . ferrous sulfate 325 (65 FE) MG tablet Take 325 mg by mouth daily with breakfast.  . hydroxychloroquine (PLAQUENIL) 200 MG tablet Take 200 mg by mouth daily.  . metoprolol tartrate (LOPRESSOR) 25 MG tablet Take 25 mg by mouth 2 (two) times daily.  . Multiple Vitamin (MULTIVITAMIN) capsule Take 1 capsule by mouth daily.  . Multiple Vitamins-Minerals (OCUVITE PRESERVISION PO) Take 1 tablet by mouth daily.  . nitroGLYCERIN (NITROSTAT) 0.4 MG SL tablet Place 1 tablet (0.4 mg total) under the tongue every 5 (five) minutes as needed for chest pain.  . Omega-3 Fatty Acids (FISH OIL) 1000 MG CAPS Take 1 capsule by mouth daily.  . predniSONE (DELTASONE) 5 MG tablet Take  5-10 mg by mouth daily.  Marland Kitchen sulfaSALAzine (AZULFIDINE) 500 MG tablet Take 1,000 mg by mouth 2 (two) times daily.   . tamsulosin (FLOMAX) 0.4 MG CAPS capsule Take 0.4 mg by mouth daily.  . traMADol (ULTRAM) 50 MG tablet Take 1 tablet (50 mg total) by mouth every 6 (six) hours.  . Zinc 50 MG TABS Take 1 tablet by mouth daily.     Allergies:   Morphine and Morphine and related   Social History   Socioeconomic History  . Marital status: Married    Spouse name: Not on file  . Number of children: Not on file  . Years of education: Not on file  . Highest education level: Not on file  Occupational History  . Not on file  Tobacco Use  . Smoking status: Former Smoker    Packs/day: 3.00    Years: 50.00    Pack years: 150.00    Types: Cigarettes    Quit date: 03/27/1978    Years since quitting: 42.4  . Smokeless tobacco: Former Systems developer    Types: Chew  Substance and Sexual Activity  . Alcohol use: No    Alcohol/week: 0.0 standard drinks  . Drug use: No  . Sexual activity: Not on file  Other Topics Concern  . Not on file  Social History Narrative  . Not on file   Social Determinants of Health   Financial Resource Strain: Not on file  Food Insecurity: Not on file  Transportation Needs: Not on file  Physical Activity: Not on file  Stress: Not on file  Social Connections: Not on file     Family History: The patient's family history includes Bleeding Disorder in his father; COPD in his father and sister; Diabetes in his son; Heart attack in his brother and son; Heart disease in his brother and son; Hypertension in his brother and son; Stroke in his brother; Varicose Veins in his sister.  ROS:   Please see the history of present illness.    Decreased appetite.  All other systems reviewed and are negative.  EKGs/Labs/Other Studies Reviewed:    The following studies were reviewed today:  2 D Doppler Echocardiogram 2022: IMPRESSIONS  1. Left ventricular ejection fraction, by  estimation, is 45 to 50%. The  left ventricle has mildly decreased function. The left ventricle  demonstrates global hypokinesis. There is mild concentric left ventricular  hypertrophy. Left ventricular diastolic  parameters are consistent with Grade I diastolic dysfunction (impaired  relaxation).  2. Right ventricular systolic function is normal. The right ventricular  size is normal. There is mildly elevated pulmonary artery systolic  pressure.  3. Left atrial size was moderately dilated.  4. Right atrial size was mildly dilated.  5.  The mitral valve is normal in structure. Mild mitral valve  regurgitation. No evidence of mitral stenosis.  6. The aortic valve is tricuspid. There is mild calcification of the  aortic valve. There is mild thickening of the aortic valve. Aortic valve  regurgitation is mild. Mild aortic valve stenosis.  7. Aortic dilatation noted. There is mild dilatation of the ascending  aorta, measuring 39 mm.   Comparison(s): Prior images reviewed side by side. Changes from prior  study are noted. Compared to prior study, EF appears very slightly reduced  globally compared to prior.   EKG:  EKG no new data  Recent Labs: 07/01/2020: BUN 16; Creatinine, Ser 0.64; Hemoglobin 12.1; Platelets 200; Potassium 3.8; Sodium 141  Recent Lipid Panel No results found for: CHOL, TRIG, HDL, CHOLHDL, VLDL, LDLCALC, LDLDIRECT  Physical Exam:    VS:  BP 130/70 (BP Location: Left Arm, Patient Position: Sitting, Cuff Size: Normal)   Pulse 72   Ht 6' (1.829 m)   Wt 162 lb (73.5 kg)   BMI 21.97 kg/m     Wt Readings from Last 3 Encounters:  08/30/20 162 lb (73.5 kg)  06/30/20 167 lb 9.6 oz (76 kg)  03/01/20 166 lb 12.8 oz (75.7 kg)     GEN: Appears older than stated age.. No acute distress HEENT: Normal NECK: No JVD. LYMPHATICS: No lymphadenopathy CARDIAC: 2/6 systolic right upper sternal murmur. RRR no gallop, or edema. VASCULAR:  Normal Pulses. No  bruits. RESPIRATORY:  Clear to auscultation without rales, wheezing or rhonchi  ABDOMEN: Soft, non-tender, non-distended, No pulsatile mass, MUSCULOSKELETAL: No deformity  SKIN: Warm and dry NEUROLOGIC:  Alert and oriented x 3 PSYCHIATRIC:  Normal affect   ASSESSMENT:    1. Coronary artery disease involving native coronary artery of native heart without angina pectoris   2. Aortic valve stenosis, etiology of cardiac valve disease unspecified   3. Essential hypertension   4. Abdominal aortic aneurysm without rupture ([place])   5. Mixed hyperlipidemia   6. Paroxysmal atrial fibrillation ([facility])    PLAN:    In order of problems listed above:  1. Symptomatic relative to angina.  Continue risk factor modification including high intensity statin therapy and aspirin. 2. Echo, aortic valve disease is mild.  He does have systolic murmur.  Discussed the relatively low importance of the valve at this time.  The most significant underlying clinical problem is COPD/interstitial lung disease requiring chronic O2 therapy. 3. Blood pressure is well controlled for age.  Continue Lopressor 25 mg twice daily.  Also serving as anti-ischemic therapy. 4. Continue metoprolol 25 mg twice daily. 5. Continue Lipitor 40 mg/day. 6. Lopressor will be continued to control rate if if atrial fibrillation.   Medication Adjustments/Labs and Tests Ordered: Current medicines are reviewed at length with the patient today.  Concerns regarding medicines are outlined above.  No orders of the defined types were placed in this encounter.  No orders of the defined types were placed in this encounter.   Patient Instructions  Medication Instructions:  Your physician recommends that you continue on your current medications as directed. Please refer to the Current Medication list given to you today.  *If you need a refill on your cardiac medications before your next appointment, please call your pharmacy*   Lab  Work: None If you have labs (blood work) drawn today and your tests are completely normal, you will receive your results only by: Marland Kitchen MyChart Message (if you have MyChart) OR . A paper copy in  the mail If you have any lab test that is abnormal or we need to change your treatment, we will call you to review the results.   Testing/Procedures: None   Follow-Up: At [facility], you and your health needs are our priority.  As part of our continuing mission to provide you with exceptional heart care, we have created designated Provider Care Teams.  These Care Teams include your primary Cardiologist (physician) and Advanced Practice Providers (APPs -  Physician Assistants and Nurse Practitioners) who all work together to provide you with the care you need, when you need it.  We recommend signing up for the patient portal called "MyChart".  Sign up information is provided on this After Visit Summary.  MyChart is used to connect with patients for Virtual Visits (Telemedicine).  Patients are able to view lab/test results, encounter notes, upcoming appointments, etc.  Non-urgent messages can be sent to your provider as well.   To learn more about what you can do with MyChart, go to [email or web address].    Your next appointment:   9 month(s)  The format for your next appointment:   In Person  Provider:   You may see Sinclair Grooms, MD or one of the following Advanced Practice Providers on your designated Care Team:    Kathyrn Drown, NP    Other Instructions      Signed, Sinclair Grooms, MD  08/30/2020 11:03 AM    [place]

## 2020-08-30 ENCOUNTER — Ambulatory Visit: Payer: Medicare Other | Admitting: Interventional Cardiology

## 2020-08-30 ENCOUNTER — Other Ambulatory Visit: Payer: Self-pay

## 2020-08-30 ENCOUNTER — Encounter: Payer: Self-pay | Admitting: Interventional Cardiology

## 2020-08-30 VITALS — BP 130/70 | HR 72 | Ht 72.0 in | Wt 162.0 lb

## 2020-08-30 DIAGNOSIS — I714 Abdominal aortic aneurysm, without rupture, unspecified: Secondary | ICD-10-CM

## 2020-08-30 DIAGNOSIS — I480 Paroxysmal atrial fibrillation: Secondary | ICD-10-CM

## 2020-08-30 DIAGNOSIS — I10 Essential (primary) hypertension: Secondary | ICD-10-CM

## 2020-08-30 DIAGNOSIS — E782 Mixed hyperlipidemia: Secondary | ICD-10-CM | POA: Diagnosis not present

## 2020-08-30 DIAGNOSIS — I350 Nonrheumatic aortic (valve) stenosis: Secondary | ICD-10-CM

## 2020-08-30 DIAGNOSIS — I2510 Atherosclerotic heart disease of native coronary artery without angina pectoris: Secondary | ICD-10-CM | POA: Diagnosis not present

## 2020-08-30 NOTE — Patient Instructions (Signed)
Medication Instructions:  Your physician recommends that you continue on your current medications as directed. Please refer to the Current Medication list given to you today.  *If you need a refill on your cardiac medications before your next appointment, please call your pharmacy*   Lab Work: None If you have labs (blood work) drawn today and your tests are completely normal, you will receive your results only by: . MyChart Message (if you have MyChart) OR . A paper copy in the mail If you have any lab test that is abnormal or we need to change your treatment, we will call you to review the results.   Testing/Procedures: None   Follow-Up: At [facility], you and your health needs are our priority.  As part of our continuing mission to provide you with exceptional heart care, we have created designated Provider Care Teams.  These Care Teams include your primary Cardiologist (physician) and Advanced Practice Providers (APPs -  Physician Assistants and Nurse Practitioners) who all work together to provide you with the care you need, when you need it.  We recommend signing up for the patient portal called "MyChart".  Sign up information is provided on this After Visit Summary.  MyChart is used to connect with patients for Virtual Visits (Telemedicine).  Patients are able to view lab/test results, encounter notes, upcoming appointments, etc.  Non-urgent messages can be sent to your provider as well.   To learn more about what you can do with MyChart, go to [email or web address].    Your next appointment:   9 month(s)  The format for your next appointment:   In Person  Provider:   You may see Henry W Smith III, MD or one of the following Advanced Practice Providers on your designated Care Team:    Jill McDaniel, NP    Other Instructions   

## 2020-08-31 DIAGNOSIS — J8410 Pulmonary fibrosis, unspecified: Secondary | ICD-10-CM | POA: Diagnosis not present

## 2020-09-17 DIAGNOSIS — G8929 Other chronic pain: Secondary | ICD-10-CM | POA: Diagnosis not present

## 2020-09-17 DIAGNOSIS — I25118 Atherosclerotic heart disease of native coronary artery with other forms of angina pectoris: Secondary | ICD-10-CM | POA: Diagnosis not present

## 2020-09-17 DIAGNOSIS — G301 Alzheimer's disease with late onset: Secondary | ICD-10-CM | POA: Diagnosis not present

## 2020-09-17 DIAGNOSIS — I10 Essential (primary) hypertension: Secondary | ICD-10-CM | POA: Diagnosis not present

## 2020-09-17 DIAGNOSIS — I2510 Atherosclerotic heart disease of native coronary artery without angina pectoris: Secondary | ICD-10-CM | POA: Diagnosis not present

## 2020-09-17 DIAGNOSIS — M069 Rheumatoid arthritis, unspecified: Secondary | ICD-10-CM | POA: Diagnosis not present

## 2020-09-17 DIAGNOSIS — I480 Paroxysmal atrial fibrillation: Secondary | ICD-10-CM | POA: Diagnosis not present

## 2020-09-17 DIAGNOSIS — D509 Iron deficiency anemia, unspecified: Secondary | ICD-10-CM | POA: Diagnosis not present

## 2020-09-17 DIAGNOSIS — K219 Gastro-esophageal reflux disease without esophagitis: Secondary | ICD-10-CM | POA: Diagnosis not present

## 2020-09-17 DIAGNOSIS — E7800 Pure hypercholesterolemia, unspecified: Secondary | ICD-10-CM | POA: Diagnosis not present

## 2020-09-20 DIAGNOSIS — W19XXXA Unspecified fall, initial encounter: Secondary | ICD-10-CM | POA: Diagnosis not present

## 2020-09-20 DIAGNOSIS — Z885 Allergy status to narcotic agent status: Secondary | ICD-10-CM | POA: Diagnosis not present

## 2020-09-20 DIAGNOSIS — E7800 Pure hypercholesterolemia, unspecified: Secondary | ICD-10-CM | POA: Diagnosis not present

## 2020-09-20 DIAGNOSIS — I252 Old myocardial infarction: Secondary | ICD-10-CM | POA: Diagnosis not present

## 2020-09-20 DIAGNOSIS — S6991XA Unspecified injury of right wrist, hand and finger(s), initial encounter: Secondary | ICD-10-CM | POA: Diagnosis not present

## 2020-09-20 DIAGNOSIS — Z87891 Personal history of nicotine dependence: Secondary | ICD-10-CM | POA: Diagnosis not present

## 2020-09-20 DIAGNOSIS — Z23 Encounter for immunization: Secondary | ICD-10-CM | POA: Diagnosis not present

## 2020-09-20 DIAGNOSIS — Z8669 Personal history of other diseases of the nervous system and sense organs: Secondary | ICD-10-CM | POA: Diagnosis not present

## 2020-09-20 DIAGNOSIS — A419 Sepsis, unspecified organism: Secondary | ICD-10-CM | POA: Diagnosis not present

## 2020-09-20 DIAGNOSIS — S51812A Laceration without foreign body of left forearm, initial encounter: Secondary | ICD-10-CM | POA: Diagnosis not present

## 2020-09-20 DIAGNOSIS — Z79899 Other long term (current) drug therapy: Secondary | ICD-10-CM | POA: Diagnosis not present

## 2020-09-20 DIAGNOSIS — G9341 Metabolic encephalopathy: Secondary | ICD-10-CM | POA: Diagnosis not present

## 2020-09-20 DIAGNOSIS — Z955 Presence of coronary angioplasty implant and graft: Secondary | ICD-10-CM | POA: Diagnosis not present

## 2020-09-20 DIAGNOSIS — I6523 Occlusion and stenosis of bilateral carotid arteries: Secondary | ICD-10-CM | POA: Diagnosis not present

## 2020-09-20 DIAGNOSIS — M79632 Pain in left forearm: Secondary | ICD-10-CM | POA: Diagnosis not present

## 2020-09-20 DIAGNOSIS — I672 Cerebral atherosclerosis: Secondary | ICD-10-CM | POA: Diagnosis not present

## 2020-09-20 DIAGNOSIS — Z8709 Personal history of other diseases of the respiratory system: Secondary | ICD-10-CM | POA: Diagnosis not present

## 2020-09-20 DIAGNOSIS — Z7982 Long term (current) use of aspirin: Secondary | ICD-10-CM | POA: Diagnosis not present

## 2020-09-20 DIAGNOSIS — Z66 Do not resuscitate: Secondary | ICD-10-CM | POA: Diagnosis not present

## 2020-09-20 DIAGNOSIS — M069 Rheumatoid arthritis, unspecified: Secondary | ICD-10-CM | POA: Diagnosis not present

## 2020-09-20 DIAGNOSIS — I10 Essential (primary) hypertension: Secondary | ICD-10-CM | POA: Diagnosis not present

## 2020-09-20 DIAGNOSIS — R051 Acute cough: Secondary | ICD-10-CM | POA: Diagnosis not present

## 2020-09-20 DIAGNOSIS — Z9981 Dependence on supplemental oxygen: Secondary | ICD-10-CM | POA: Diagnosis not present

## 2020-09-20 DIAGNOSIS — R0602 Shortness of breath: Secondary | ICD-10-CM | POA: Diagnosis not present

## 2020-09-20 DIAGNOSIS — S61411A Laceration without foreign body of right hand, initial encounter: Secondary | ICD-10-CM | POA: Diagnosis not present

## 2020-09-20 DIAGNOSIS — M19031 Primary osteoarthritis, right wrist: Secondary | ICD-10-CM | POA: Diagnosis not present

## 2020-09-20 DIAGNOSIS — M19041 Primary osteoarthritis, right hand: Secondary | ICD-10-CM | POA: Diagnosis not present

## 2020-09-20 DIAGNOSIS — U071 COVID-19: Secondary | ICD-10-CM | POA: Diagnosis not present

## 2020-09-20 DIAGNOSIS — S61212A Laceration without foreign body of right middle finger without damage to nail, initial encounter: Secondary | ICD-10-CM | POA: Diagnosis not present

## 2020-09-20 DIAGNOSIS — Z20822 Contact with and (suspected) exposure to covid-19: Secondary | ICD-10-CM | POA: Diagnosis not present

## 2020-09-20 DIAGNOSIS — I2510 Atherosclerotic heart disease of native coronary artery without angina pectoris: Secondary | ICD-10-CM | POA: Diagnosis not present

## 2020-09-20 DIAGNOSIS — M19032 Primary osteoarthritis, left wrist: Secondary | ICD-10-CM | POA: Diagnosis not present

## 2020-09-20 DIAGNOSIS — J8410 Pulmonary fibrosis, unspecified: Secondary | ICD-10-CM | POA: Diagnosis not present

## 2020-09-25 DIAGNOSIS — I252 Old myocardial infarction: Secondary | ICD-10-CM | POA: Diagnosis not present

## 2020-09-25 DIAGNOSIS — G8929 Other chronic pain: Secondary | ICD-10-CM | POA: Diagnosis not present

## 2020-09-25 DIAGNOSIS — I10 Essential (primary) hypertension: Secondary | ICD-10-CM | POA: Diagnosis not present

## 2020-09-25 DIAGNOSIS — K219 Gastro-esophageal reflux disease without esophagitis: Secondary | ICD-10-CM | POA: Diagnosis not present

## 2020-09-25 DIAGNOSIS — N139 Obstructive and reflux uropathy, unspecified: Secondary | ICD-10-CM | POA: Diagnosis not present

## 2020-09-25 DIAGNOSIS — Z9181 History of falling: Secondary | ICD-10-CM | POA: Diagnosis not present

## 2020-09-25 DIAGNOSIS — Z9981 Dependence on supplemental oxygen: Secondary | ICD-10-CM | POA: Diagnosis not present

## 2020-09-25 DIAGNOSIS — J9611 Chronic respiratory failure with hypoxia: Secondary | ICD-10-CM | POA: Diagnosis not present

## 2020-09-25 DIAGNOSIS — E785 Hyperlipidemia, unspecified: Secondary | ICD-10-CM | POA: Diagnosis not present

## 2020-09-25 DIAGNOSIS — J8410 Pulmonary fibrosis, unspecified: Secondary | ICD-10-CM | POA: Diagnosis not present

## 2020-09-25 DIAGNOSIS — I4891 Unspecified atrial fibrillation: Secondary | ICD-10-CM | POA: Diagnosis not present

## 2020-09-25 DIAGNOSIS — Z87891 Personal history of nicotine dependence: Secondary | ICD-10-CM | POA: Diagnosis not present

## 2020-09-25 DIAGNOSIS — S51812D Laceration without foreign body of left forearm, subsequent encounter: Secondary | ICD-10-CM | POA: Diagnosis not present

## 2020-09-25 DIAGNOSIS — I2510 Atherosclerotic heart disease of native coronary artery without angina pectoris: Secondary | ICD-10-CM | POA: Diagnosis not present

## 2020-09-25 DIAGNOSIS — M069 Rheumatoid arthritis, unspecified: Secondary | ICD-10-CM | POA: Diagnosis not present

## 2020-09-25 DIAGNOSIS — I350 Nonrheumatic aortic (valve) stenosis: Secondary | ICD-10-CM | POA: Diagnosis not present

## 2020-09-25 DIAGNOSIS — D509 Iron deficiency anemia, unspecified: Secondary | ICD-10-CM | POA: Diagnosis not present

## 2020-09-25 DIAGNOSIS — F32A Depression, unspecified: Secondary | ICD-10-CM | POA: Diagnosis not present

## 2020-09-25 DIAGNOSIS — U071 COVID-19: Secondary | ICD-10-CM | POA: Diagnosis not present

## 2020-09-28 DIAGNOSIS — Z5189 Encounter for other specified aftercare: Secondary | ICD-10-CM | POA: Diagnosis not present

## 2020-09-28 DIAGNOSIS — Z4802 Encounter for removal of sutures: Secondary | ICD-10-CM | POA: Diagnosis not present

## 2020-09-30 DIAGNOSIS — J9611 Chronic respiratory failure with hypoxia: Secondary | ICD-10-CM | POA: Diagnosis not present

## 2020-09-30 DIAGNOSIS — S51812D Laceration without foreign body of left forearm, subsequent encounter: Secondary | ICD-10-CM | POA: Diagnosis not present

## 2020-09-30 DIAGNOSIS — I10 Essential (primary) hypertension: Secondary | ICD-10-CM | POA: Diagnosis not present

## 2020-09-30 DIAGNOSIS — F32A Depression, unspecified: Secondary | ICD-10-CM | POA: Diagnosis not present

## 2020-09-30 DIAGNOSIS — M069 Rheumatoid arthritis, unspecified: Secondary | ICD-10-CM | POA: Diagnosis not present

## 2020-09-30 DIAGNOSIS — J8410 Pulmonary fibrosis, unspecified: Secondary | ICD-10-CM | POA: Diagnosis not present

## 2020-09-30 DIAGNOSIS — I2510 Atherosclerotic heart disease of native coronary artery without angina pectoris: Secondary | ICD-10-CM | POA: Diagnosis not present

## 2020-09-30 DIAGNOSIS — I252 Old myocardial infarction: Secondary | ICD-10-CM | POA: Diagnosis not present

## 2020-09-30 DIAGNOSIS — Z87891 Personal history of nicotine dependence: Secondary | ICD-10-CM | POA: Diagnosis not present

## 2020-09-30 DIAGNOSIS — I350 Nonrheumatic aortic (valve) stenosis: Secondary | ICD-10-CM | POA: Diagnosis not present

## 2020-09-30 DIAGNOSIS — I4891 Unspecified atrial fibrillation: Secondary | ICD-10-CM | POA: Diagnosis not present

## 2020-09-30 DIAGNOSIS — G8929 Other chronic pain: Secondary | ICD-10-CM | POA: Diagnosis not present

## 2020-09-30 DIAGNOSIS — D509 Iron deficiency anemia, unspecified: Secondary | ICD-10-CM | POA: Diagnosis not present

## 2020-09-30 DIAGNOSIS — N139 Obstructive and reflux uropathy, unspecified: Secondary | ICD-10-CM | POA: Diagnosis not present

## 2020-09-30 DIAGNOSIS — E785 Hyperlipidemia, unspecified: Secondary | ICD-10-CM | POA: Diagnosis not present

## 2020-09-30 DIAGNOSIS — Z9981 Dependence on supplemental oxygen: Secondary | ICD-10-CM | POA: Diagnosis not present

## 2020-09-30 DIAGNOSIS — K219 Gastro-esophageal reflux disease without esophagitis: Secondary | ICD-10-CM | POA: Diagnosis not present

## 2020-09-30 DIAGNOSIS — Z9181 History of falling: Secondary | ICD-10-CM | POA: Diagnosis not present

## 2020-09-30 DIAGNOSIS — U071 COVID-19: Secondary | ICD-10-CM | POA: Diagnosis not present

## 2020-10-02 DIAGNOSIS — N139 Obstructive and reflux uropathy, unspecified: Secondary | ICD-10-CM | POA: Diagnosis not present

## 2020-10-02 DIAGNOSIS — I10 Essential (primary) hypertension: Secondary | ICD-10-CM | POA: Diagnosis not present

## 2020-10-02 DIAGNOSIS — F32A Depression, unspecified: Secondary | ICD-10-CM | POA: Diagnosis not present

## 2020-10-02 DIAGNOSIS — J8410 Pulmonary fibrosis, unspecified: Secondary | ICD-10-CM | POA: Diagnosis not present

## 2020-10-02 DIAGNOSIS — J9611 Chronic respiratory failure with hypoxia: Secondary | ICD-10-CM | POA: Diagnosis not present

## 2020-10-02 DIAGNOSIS — Z9181 History of falling: Secondary | ICD-10-CM | POA: Diagnosis not present

## 2020-10-02 DIAGNOSIS — I4891 Unspecified atrial fibrillation: Secondary | ICD-10-CM | POA: Diagnosis not present

## 2020-10-02 DIAGNOSIS — I2510 Atherosclerotic heart disease of native coronary artery without angina pectoris: Secondary | ICD-10-CM | POA: Diagnosis not present

## 2020-10-02 DIAGNOSIS — G8929 Other chronic pain: Secondary | ICD-10-CM | POA: Diagnosis not present

## 2020-10-02 DIAGNOSIS — E785 Hyperlipidemia, unspecified: Secondary | ICD-10-CM | POA: Diagnosis not present

## 2020-10-02 DIAGNOSIS — U071 COVID-19: Secondary | ICD-10-CM | POA: Diagnosis not present

## 2020-10-02 DIAGNOSIS — S51812D Laceration without foreign body of left forearm, subsequent encounter: Secondary | ICD-10-CM | POA: Diagnosis not present

## 2020-10-02 DIAGNOSIS — Z87891 Personal history of nicotine dependence: Secondary | ICD-10-CM | POA: Diagnosis not present

## 2020-10-02 DIAGNOSIS — Z9981 Dependence on supplemental oxygen: Secondary | ICD-10-CM | POA: Diagnosis not present

## 2020-10-02 DIAGNOSIS — I350 Nonrheumatic aortic (valve) stenosis: Secondary | ICD-10-CM | POA: Diagnosis not present

## 2020-10-02 DIAGNOSIS — M069 Rheumatoid arthritis, unspecified: Secondary | ICD-10-CM | POA: Diagnosis not present

## 2020-10-02 DIAGNOSIS — I252 Old myocardial infarction: Secondary | ICD-10-CM | POA: Diagnosis not present

## 2020-10-02 DIAGNOSIS — K219 Gastro-esophageal reflux disease without esophagitis: Secondary | ICD-10-CM | POA: Diagnosis not present

## 2020-10-02 DIAGNOSIS — D509 Iron deficiency anemia, unspecified: Secondary | ICD-10-CM | POA: Diagnosis not present

## 2020-10-04 DIAGNOSIS — N210 Calculus in bladder: Secondary | ICD-10-CM | POA: Diagnosis not present

## 2020-10-04 DIAGNOSIS — R3912 Poor urinary stream: Secondary | ICD-10-CM | POA: Diagnosis not present

## 2020-10-06 DIAGNOSIS — J9611 Chronic respiratory failure with hypoxia: Secondary | ICD-10-CM | POA: Diagnosis not present

## 2020-10-06 DIAGNOSIS — G319 Degenerative disease of nervous system, unspecified: Secondary | ICD-10-CM | POA: Diagnosis not present

## 2020-10-07 DIAGNOSIS — J9611 Chronic respiratory failure with hypoxia: Secondary | ICD-10-CM | POA: Diagnosis not present

## 2020-10-07 DIAGNOSIS — E785 Hyperlipidemia, unspecified: Secondary | ICD-10-CM | POA: Diagnosis not present

## 2020-10-07 DIAGNOSIS — G8929 Other chronic pain: Secondary | ICD-10-CM | POA: Diagnosis not present

## 2020-10-07 DIAGNOSIS — Z9181 History of falling: Secondary | ICD-10-CM | POA: Diagnosis not present

## 2020-10-07 DIAGNOSIS — I4891 Unspecified atrial fibrillation: Secondary | ICD-10-CM | POA: Diagnosis not present

## 2020-10-07 DIAGNOSIS — I350 Nonrheumatic aortic (valve) stenosis: Secondary | ICD-10-CM | POA: Diagnosis not present

## 2020-10-07 DIAGNOSIS — K219 Gastro-esophageal reflux disease without esophagitis: Secondary | ICD-10-CM | POA: Diagnosis not present

## 2020-10-07 DIAGNOSIS — M069 Rheumatoid arthritis, unspecified: Secondary | ICD-10-CM | POA: Diagnosis not present

## 2020-10-07 DIAGNOSIS — Z9981 Dependence on supplemental oxygen: Secondary | ICD-10-CM | POA: Diagnosis not present

## 2020-10-07 DIAGNOSIS — J8410 Pulmonary fibrosis, unspecified: Secondary | ICD-10-CM | POA: Diagnosis not present

## 2020-10-07 DIAGNOSIS — I252 Old myocardial infarction: Secondary | ICD-10-CM | POA: Diagnosis not present

## 2020-10-07 DIAGNOSIS — U071 COVID-19: Secondary | ICD-10-CM | POA: Diagnosis not present

## 2020-10-07 DIAGNOSIS — I2510 Atherosclerotic heart disease of native coronary artery without angina pectoris: Secondary | ICD-10-CM | POA: Diagnosis not present

## 2020-10-07 DIAGNOSIS — F32A Depression, unspecified: Secondary | ICD-10-CM | POA: Diagnosis not present

## 2020-10-07 DIAGNOSIS — S51812D Laceration without foreign body of left forearm, subsequent encounter: Secondary | ICD-10-CM | POA: Diagnosis not present

## 2020-10-07 DIAGNOSIS — D509 Iron deficiency anemia, unspecified: Secondary | ICD-10-CM | POA: Diagnosis not present

## 2020-10-07 DIAGNOSIS — Z87891 Personal history of nicotine dependence: Secondary | ICD-10-CM | POA: Diagnosis not present

## 2020-10-07 DIAGNOSIS — I10 Essential (primary) hypertension: Secondary | ICD-10-CM | POA: Diagnosis not present

## 2020-10-07 DIAGNOSIS — N139 Obstructive and reflux uropathy, unspecified: Secondary | ICD-10-CM | POA: Diagnosis not present

## 2020-10-13 DIAGNOSIS — J9611 Chronic respiratory failure with hypoxia: Secondary | ICD-10-CM | POA: Diagnosis not present

## 2020-10-13 DIAGNOSIS — G8929 Other chronic pain: Secondary | ICD-10-CM | POA: Diagnosis not present

## 2020-10-13 DIAGNOSIS — M069 Rheumatoid arthritis, unspecified: Secondary | ICD-10-CM | POA: Diagnosis not present

## 2020-10-13 DIAGNOSIS — S51812D Laceration without foreign body of left forearm, subsequent encounter: Secondary | ICD-10-CM | POA: Diagnosis not present

## 2020-10-13 DIAGNOSIS — I10 Essential (primary) hypertension: Secondary | ICD-10-CM | POA: Diagnosis not present

## 2020-10-13 DIAGNOSIS — K219 Gastro-esophageal reflux disease without esophagitis: Secondary | ICD-10-CM | POA: Diagnosis not present

## 2020-10-13 DIAGNOSIS — N139 Obstructive and reflux uropathy, unspecified: Secondary | ICD-10-CM | POA: Diagnosis not present

## 2020-10-13 DIAGNOSIS — Z9981 Dependence on supplemental oxygen: Secondary | ICD-10-CM | POA: Diagnosis not present

## 2020-10-13 DIAGNOSIS — U071 COVID-19: Secondary | ICD-10-CM | POA: Diagnosis not present

## 2020-10-13 DIAGNOSIS — Z87891 Personal history of nicotine dependence: Secondary | ICD-10-CM | POA: Diagnosis not present

## 2020-10-13 DIAGNOSIS — D509 Iron deficiency anemia, unspecified: Secondary | ICD-10-CM | POA: Diagnosis not present

## 2020-10-13 DIAGNOSIS — I252 Old myocardial infarction: Secondary | ICD-10-CM | POA: Diagnosis not present

## 2020-10-13 DIAGNOSIS — F32A Depression, unspecified: Secondary | ICD-10-CM | POA: Diagnosis not present

## 2020-10-13 DIAGNOSIS — E785 Hyperlipidemia, unspecified: Secondary | ICD-10-CM | POA: Diagnosis not present

## 2020-10-13 DIAGNOSIS — I4891 Unspecified atrial fibrillation: Secondary | ICD-10-CM | POA: Diagnosis not present

## 2020-10-13 DIAGNOSIS — J8410 Pulmonary fibrosis, unspecified: Secondary | ICD-10-CM | POA: Diagnosis not present

## 2020-10-13 DIAGNOSIS — Z9181 History of falling: Secondary | ICD-10-CM | POA: Diagnosis not present

## 2020-10-13 DIAGNOSIS — I2510 Atherosclerotic heart disease of native coronary artery without angina pectoris: Secondary | ICD-10-CM | POA: Diagnosis not present

## 2020-10-13 DIAGNOSIS — I350 Nonrheumatic aortic (valve) stenosis: Secondary | ICD-10-CM | POA: Diagnosis not present

## 2020-10-20 DIAGNOSIS — Z9981 Dependence on supplemental oxygen: Secondary | ICD-10-CM | POA: Diagnosis not present

## 2020-10-20 DIAGNOSIS — I252 Old myocardial infarction: Secondary | ICD-10-CM | POA: Diagnosis not present

## 2020-10-20 DIAGNOSIS — F32A Depression, unspecified: Secondary | ICD-10-CM | POA: Diagnosis not present

## 2020-10-20 DIAGNOSIS — N139 Obstructive and reflux uropathy, unspecified: Secondary | ICD-10-CM | POA: Diagnosis not present

## 2020-10-20 DIAGNOSIS — I10 Essential (primary) hypertension: Secondary | ICD-10-CM | POA: Diagnosis not present

## 2020-10-20 DIAGNOSIS — K219 Gastro-esophageal reflux disease without esophagitis: Secondary | ICD-10-CM | POA: Diagnosis not present

## 2020-10-20 DIAGNOSIS — U071 COVID-19: Secondary | ICD-10-CM | POA: Diagnosis not present

## 2020-10-20 DIAGNOSIS — E785 Hyperlipidemia, unspecified: Secondary | ICD-10-CM | POA: Diagnosis not present

## 2020-10-20 DIAGNOSIS — M069 Rheumatoid arthritis, unspecified: Secondary | ICD-10-CM | POA: Diagnosis not present

## 2020-10-20 DIAGNOSIS — G8929 Other chronic pain: Secondary | ICD-10-CM | POA: Diagnosis not present

## 2020-10-20 DIAGNOSIS — Z9181 History of falling: Secondary | ICD-10-CM | POA: Diagnosis not present

## 2020-10-20 DIAGNOSIS — S51812D Laceration without foreign body of left forearm, subsequent encounter: Secondary | ICD-10-CM | POA: Diagnosis not present

## 2020-10-20 DIAGNOSIS — I2510 Atherosclerotic heart disease of native coronary artery without angina pectoris: Secondary | ICD-10-CM | POA: Diagnosis not present

## 2020-10-20 DIAGNOSIS — J8410 Pulmonary fibrosis, unspecified: Secondary | ICD-10-CM | POA: Diagnosis not present

## 2020-10-20 DIAGNOSIS — I350 Nonrheumatic aortic (valve) stenosis: Secondary | ICD-10-CM | POA: Diagnosis not present

## 2020-10-20 DIAGNOSIS — J9611 Chronic respiratory failure with hypoxia: Secondary | ICD-10-CM | POA: Diagnosis not present

## 2020-10-20 DIAGNOSIS — D509 Iron deficiency anemia, unspecified: Secondary | ICD-10-CM | POA: Diagnosis not present

## 2020-10-20 DIAGNOSIS — I4891 Unspecified atrial fibrillation: Secondary | ICD-10-CM | POA: Diagnosis not present

## 2020-10-20 DIAGNOSIS — Z87891 Personal history of nicotine dependence: Secondary | ICD-10-CM | POA: Diagnosis not present

## 2020-10-29 DIAGNOSIS — J8410 Pulmonary fibrosis, unspecified: Secondary | ICD-10-CM | POA: Diagnosis not present

## 2020-10-29 DIAGNOSIS — I4891 Unspecified atrial fibrillation: Secondary | ICD-10-CM | POA: Diagnosis not present

## 2020-10-29 DIAGNOSIS — N139 Obstructive and reflux uropathy, unspecified: Secondary | ICD-10-CM | POA: Diagnosis not present

## 2020-10-29 DIAGNOSIS — U071 COVID-19: Secondary | ICD-10-CM | POA: Diagnosis not present

## 2020-10-29 DIAGNOSIS — G8929 Other chronic pain: Secondary | ICD-10-CM | POA: Diagnosis not present

## 2020-10-29 DIAGNOSIS — Z9181 History of falling: Secondary | ICD-10-CM | POA: Diagnosis not present

## 2020-10-29 DIAGNOSIS — S51812D Laceration without foreign body of left forearm, subsequent encounter: Secondary | ICD-10-CM | POA: Diagnosis not present

## 2020-10-29 DIAGNOSIS — Z87891 Personal history of nicotine dependence: Secondary | ICD-10-CM | POA: Diagnosis not present

## 2020-10-29 DIAGNOSIS — I2510 Atherosclerotic heart disease of native coronary artery without angina pectoris: Secondary | ICD-10-CM | POA: Diagnosis not present

## 2020-10-29 DIAGNOSIS — K219 Gastro-esophageal reflux disease without esophagitis: Secondary | ICD-10-CM | POA: Diagnosis not present

## 2020-10-29 DIAGNOSIS — M069 Rheumatoid arthritis, unspecified: Secondary | ICD-10-CM | POA: Diagnosis not present

## 2020-10-29 DIAGNOSIS — I252 Old myocardial infarction: Secondary | ICD-10-CM | POA: Diagnosis not present

## 2020-10-29 DIAGNOSIS — J9611 Chronic respiratory failure with hypoxia: Secondary | ICD-10-CM | POA: Diagnosis not present

## 2020-10-29 DIAGNOSIS — E785 Hyperlipidemia, unspecified: Secondary | ICD-10-CM | POA: Diagnosis not present

## 2020-10-29 DIAGNOSIS — F32A Depression, unspecified: Secondary | ICD-10-CM | POA: Diagnosis not present

## 2020-10-29 DIAGNOSIS — D509 Iron deficiency anemia, unspecified: Secondary | ICD-10-CM | POA: Diagnosis not present

## 2020-10-29 DIAGNOSIS — I350 Nonrheumatic aortic (valve) stenosis: Secondary | ICD-10-CM | POA: Diagnosis not present

## 2020-10-29 DIAGNOSIS — I10 Essential (primary) hypertension: Secondary | ICD-10-CM | POA: Diagnosis not present

## 2020-10-29 DIAGNOSIS — Z9981 Dependence on supplemental oxygen: Secondary | ICD-10-CM | POA: Diagnosis not present

## 2020-10-31 DIAGNOSIS — J8410 Pulmonary fibrosis, unspecified: Secondary | ICD-10-CM | POA: Diagnosis not present

## 2020-11-04 DIAGNOSIS — G8929 Other chronic pain: Secondary | ICD-10-CM | POA: Diagnosis not present

## 2020-11-04 DIAGNOSIS — S51812D Laceration without foreign body of left forearm, subsequent encounter: Secondary | ICD-10-CM | POA: Diagnosis not present

## 2020-11-04 DIAGNOSIS — U071 COVID-19: Secondary | ICD-10-CM | POA: Diagnosis not present

## 2020-11-04 DIAGNOSIS — I4891 Unspecified atrial fibrillation: Secondary | ICD-10-CM | POA: Diagnosis not present

## 2020-11-04 DIAGNOSIS — E785 Hyperlipidemia, unspecified: Secondary | ICD-10-CM | POA: Diagnosis not present

## 2020-11-04 DIAGNOSIS — D509 Iron deficiency anemia, unspecified: Secondary | ICD-10-CM | POA: Diagnosis not present

## 2020-11-04 DIAGNOSIS — Z9181 History of falling: Secondary | ICD-10-CM | POA: Diagnosis not present

## 2020-11-04 DIAGNOSIS — J8410 Pulmonary fibrosis, unspecified: Secondary | ICD-10-CM | POA: Diagnosis not present

## 2020-11-04 DIAGNOSIS — K219 Gastro-esophageal reflux disease without esophagitis: Secondary | ICD-10-CM | POA: Diagnosis not present

## 2020-11-04 DIAGNOSIS — Z9981 Dependence on supplemental oxygen: Secondary | ICD-10-CM | POA: Diagnosis not present

## 2020-11-04 DIAGNOSIS — Z87891 Personal history of nicotine dependence: Secondary | ICD-10-CM | POA: Diagnosis not present

## 2020-11-04 DIAGNOSIS — N139 Obstructive and reflux uropathy, unspecified: Secondary | ICD-10-CM | POA: Diagnosis not present

## 2020-11-04 DIAGNOSIS — I350 Nonrheumatic aortic (valve) stenosis: Secondary | ICD-10-CM | POA: Diagnosis not present

## 2020-11-04 DIAGNOSIS — F32A Depression, unspecified: Secondary | ICD-10-CM | POA: Diagnosis not present

## 2020-11-04 DIAGNOSIS — M069 Rheumatoid arthritis, unspecified: Secondary | ICD-10-CM | POA: Diagnosis not present

## 2020-11-04 DIAGNOSIS — I2510 Atherosclerotic heart disease of native coronary artery without angina pectoris: Secondary | ICD-10-CM | POA: Diagnosis not present

## 2020-11-04 DIAGNOSIS — I10 Essential (primary) hypertension: Secondary | ICD-10-CM | POA: Diagnosis not present

## 2020-11-04 DIAGNOSIS — I252 Old myocardial infarction: Secondary | ICD-10-CM | POA: Diagnosis not present

## 2020-11-04 DIAGNOSIS — J9611 Chronic respiratory failure with hypoxia: Secondary | ICD-10-CM | POA: Diagnosis not present

## 2020-11-11 DIAGNOSIS — I2510 Atherosclerotic heart disease of native coronary artery without angina pectoris: Secondary | ICD-10-CM | POA: Diagnosis not present

## 2020-11-11 DIAGNOSIS — M069 Rheumatoid arthritis, unspecified: Secondary | ICD-10-CM | POA: Diagnosis not present

## 2020-11-11 DIAGNOSIS — U071 COVID-19: Secondary | ICD-10-CM | POA: Diagnosis not present

## 2020-11-11 DIAGNOSIS — K219 Gastro-esophageal reflux disease without esophagitis: Secondary | ICD-10-CM | POA: Diagnosis not present

## 2020-11-11 DIAGNOSIS — Z9981 Dependence on supplemental oxygen: Secondary | ICD-10-CM | POA: Diagnosis not present

## 2020-11-11 DIAGNOSIS — I350 Nonrheumatic aortic (valve) stenosis: Secondary | ICD-10-CM | POA: Diagnosis not present

## 2020-11-11 DIAGNOSIS — Z87891 Personal history of nicotine dependence: Secondary | ICD-10-CM | POA: Diagnosis not present

## 2020-11-11 DIAGNOSIS — I252 Old myocardial infarction: Secondary | ICD-10-CM | POA: Diagnosis not present

## 2020-11-11 DIAGNOSIS — G8929 Other chronic pain: Secondary | ICD-10-CM | POA: Diagnosis not present

## 2020-11-11 DIAGNOSIS — I10 Essential (primary) hypertension: Secondary | ICD-10-CM | POA: Diagnosis not present

## 2020-11-11 DIAGNOSIS — F32A Depression, unspecified: Secondary | ICD-10-CM | POA: Diagnosis not present

## 2020-11-11 DIAGNOSIS — N139 Obstructive and reflux uropathy, unspecified: Secondary | ICD-10-CM | POA: Diagnosis not present

## 2020-11-11 DIAGNOSIS — Z9181 History of falling: Secondary | ICD-10-CM | POA: Diagnosis not present

## 2020-11-11 DIAGNOSIS — D509 Iron deficiency anemia, unspecified: Secondary | ICD-10-CM | POA: Diagnosis not present

## 2020-11-11 DIAGNOSIS — E785 Hyperlipidemia, unspecified: Secondary | ICD-10-CM | POA: Diagnosis not present

## 2020-11-11 DIAGNOSIS — J8410 Pulmonary fibrosis, unspecified: Secondary | ICD-10-CM | POA: Diagnosis not present

## 2020-11-11 DIAGNOSIS — I4891 Unspecified atrial fibrillation: Secondary | ICD-10-CM | POA: Diagnosis not present

## 2020-11-11 DIAGNOSIS — S51812D Laceration without foreign body of left forearm, subsequent encounter: Secondary | ICD-10-CM | POA: Diagnosis not present

## 2020-11-11 DIAGNOSIS — J9611 Chronic respiratory failure with hypoxia: Secondary | ICD-10-CM | POA: Diagnosis not present

## 2020-11-19 DIAGNOSIS — J449 Chronic obstructive pulmonary disease, unspecified: Secondary | ICD-10-CM | POA: Diagnosis not present

## 2020-11-19 DIAGNOSIS — E785 Hyperlipidemia, unspecified: Secondary | ICD-10-CM | POA: Diagnosis not present

## 2020-11-19 DIAGNOSIS — Z87891 Personal history of nicotine dependence: Secondary | ICD-10-CM | POA: Diagnosis not present

## 2020-11-19 DIAGNOSIS — I252 Old myocardial infarction: Secondary | ICD-10-CM | POA: Diagnosis not present

## 2020-11-19 DIAGNOSIS — D509 Iron deficiency anemia, unspecified: Secondary | ICD-10-CM | POA: Diagnosis not present

## 2020-11-19 DIAGNOSIS — Z9981 Dependence on supplemental oxygen: Secondary | ICD-10-CM | POA: Diagnosis not present

## 2020-11-19 DIAGNOSIS — F32A Depression, unspecified: Secondary | ICD-10-CM | POA: Diagnosis not present

## 2020-11-19 DIAGNOSIS — G8929 Other chronic pain: Secondary | ICD-10-CM | POA: Diagnosis not present

## 2020-11-19 DIAGNOSIS — U071 COVID-19: Secondary | ICD-10-CM | POA: Diagnosis not present

## 2020-11-19 DIAGNOSIS — I350 Nonrheumatic aortic (valve) stenosis: Secondary | ICD-10-CM | POA: Diagnosis not present

## 2020-11-19 DIAGNOSIS — I10 Essential (primary) hypertension: Secondary | ICD-10-CM | POA: Diagnosis not present

## 2020-11-19 DIAGNOSIS — N139 Obstructive and reflux uropathy, unspecified: Secondary | ICD-10-CM | POA: Diagnosis not present

## 2020-11-19 DIAGNOSIS — I2510 Atherosclerotic heart disease of native coronary artery without angina pectoris: Secondary | ICD-10-CM | POA: Diagnosis not present

## 2020-11-19 DIAGNOSIS — J9611 Chronic respiratory failure with hypoxia: Secondary | ICD-10-CM | POA: Diagnosis not present

## 2020-11-19 DIAGNOSIS — I4891 Unspecified atrial fibrillation: Secondary | ICD-10-CM | POA: Diagnosis not present

## 2020-11-19 DIAGNOSIS — M069 Rheumatoid arthritis, unspecified: Secondary | ICD-10-CM | POA: Diagnosis not present

## 2020-11-19 DIAGNOSIS — J8410 Pulmonary fibrosis, unspecified: Secondary | ICD-10-CM | POA: Diagnosis not present

## 2020-11-19 DIAGNOSIS — Z9181 History of falling: Secondary | ICD-10-CM | POA: Diagnosis not present

## 2020-11-19 DIAGNOSIS — S51812D Laceration without foreign body of left forearm, subsequent encounter: Secondary | ICD-10-CM | POA: Diagnosis not present

## 2020-11-19 DIAGNOSIS — K219 Gastro-esophageal reflux disease without esophagitis: Secondary | ICD-10-CM | POA: Diagnosis not present

## 2020-11-22 DIAGNOSIS — J9611 Chronic respiratory failure with hypoxia: Secondary | ICD-10-CM | POA: Diagnosis not present

## 2020-11-22 DIAGNOSIS — J449 Chronic obstructive pulmonary disease, unspecified: Secondary | ICD-10-CM | POA: Diagnosis not present

## 2020-11-23 DIAGNOSIS — D509 Iron deficiency anemia, unspecified: Secondary | ICD-10-CM | POA: Diagnosis not present

## 2020-11-23 DIAGNOSIS — I480 Paroxysmal atrial fibrillation: Secondary | ICD-10-CM | POA: Diagnosis not present

## 2020-11-23 DIAGNOSIS — E7800 Pure hypercholesterolemia, unspecified: Secondary | ICD-10-CM | POA: Diagnosis not present

## 2020-11-23 DIAGNOSIS — I10 Essential (primary) hypertension: Secondary | ICD-10-CM | POA: Diagnosis not present

## 2020-11-23 DIAGNOSIS — I2510 Atherosclerotic heart disease of native coronary artery without angina pectoris: Secondary | ICD-10-CM | POA: Diagnosis not present

## 2020-11-23 DIAGNOSIS — G301 Alzheimer's disease with late onset: Secondary | ICD-10-CM | POA: Diagnosis not present

## 2020-11-23 DIAGNOSIS — K219 Gastro-esophageal reflux disease without esophagitis: Secondary | ICD-10-CM | POA: Diagnosis not present

## 2020-11-23 DIAGNOSIS — G8929 Other chronic pain: Secondary | ICD-10-CM | POA: Diagnosis not present

## 2020-11-23 DIAGNOSIS — M069 Rheumatoid arthritis, unspecified: Secondary | ICD-10-CM | POA: Diagnosis not present

## 2020-11-24 DIAGNOSIS — I4891 Unspecified atrial fibrillation: Secondary | ICD-10-CM | POA: Diagnosis not present

## 2020-11-24 DIAGNOSIS — I2510 Atherosclerotic heart disease of native coronary artery without angina pectoris: Secondary | ICD-10-CM | POA: Diagnosis not present

## 2020-11-24 DIAGNOSIS — I10 Essential (primary) hypertension: Secondary | ICD-10-CM | POA: Diagnosis not present

## 2020-11-24 DIAGNOSIS — D509 Iron deficiency anemia, unspecified: Secondary | ICD-10-CM | POA: Diagnosis not present

## 2020-11-24 DIAGNOSIS — E7800 Pure hypercholesterolemia, unspecified: Secondary | ICD-10-CM | POA: Diagnosis not present

## 2020-11-24 DIAGNOSIS — I350 Nonrheumatic aortic (valve) stenosis: Secondary | ICD-10-CM | POA: Diagnosis not present

## 2020-11-24 DIAGNOSIS — I252 Old myocardial infarction: Secondary | ICD-10-CM | POA: Diagnosis not present

## 2020-11-24 DIAGNOSIS — Z87891 Personal history of nicotine dependence: Secondary | ICD-10-CM | POA: Diagnosis not present

## 2020-11-24 DIAGNOSIS — K219 Gastro-esophageal reflux disease without esophagitis: Secondary | ICD-10-CM | POA: Diagnosis not present

## 2020-11-24 DIAGNOSIS — M069 Rheumatoid arthritis, unspecified: Secondary | ICD-10-CM | POA: Diagnosis not present

## 2020-11-24 DIAGNOSIS — J9611 Chronic respiratory failure with hypoxia: Secondary | ICD-10-CM | POA: Diagnosis not present

## 2020-11-24 DIAGNOSIS — F32A Depression, unspecified: Secondary | ICD-10-CM | POA: Diagnosis not present

## 2020-11-24 DIAGNOSIS — Z9981 Dependence on supplemental oxygen: Secondary | ICD-10-CM | POA: Diagnosis not present

## 2020-11-24 DIAGNOSIS — S51011D Laceration without foreign body of right elbow, subsequent encounter: Secondary | ICD-10-CM | POA: Diagnosis not present

## 2020-11-24 DIAGNOSIS — J8410 Pulmonary fibrosis, unspecified: Secondary | ICD-10-CM | POA: Diagnosis not present

## 2020-11-24 DIAGNOSIS — Z7982 Long term (current) use of aspirin: Secondary | ICD-10-CM | POA: Diagnosis not present

## 2020-11-24 DIAGNOSIS — N139 Obstructive and reflux uropathy, unspecified: Secondary | ICD-10-CM | POA: Diagnosis not present

## 2020-11-24 DIAGNOSIS — G8929 Other chronic pain: Secondary | ICD-10-CM | POA: Diagnosis not present

## 2020-11-24 DIAGNOSIS — S51811D Laceration without foreign body of right forearm, subsequent encounter: Secondary | ICD-10-CM | POA: Diagnosis not present

## 2020-12-01 DIAGNOSIS — D509 Iron deficiency anemia, unspecified: Secondary | ICD-10-CM | POA: Diagnosis not present

## 2020-12-01 DIAGNOSIS — I10 Essential (primary) hypertension: Secondary | ICD-10-CM | POA: Diagnosis not present

## 2020-12-01 DIAGNOSIS — E7800 Pure hypercholesterolemia, unspecified: Secondary | ICD-10-CM | POA: Diagnosis not present

## 2020-12-01 DIAGNOSIS — K219 Gastro-esophageal reflux disease without esophagitis: Secondary | ICD-10-CM | POA: Diagnosis not present

## 2020-12-01 DIAGNOSIS — I350 Nonrheumatic aortic (valve) stenosis: Secondary | ICD-10-CM | POA: Diagnosis not present

## 2020-12-01 DIAGNOSIS — I2510 Atherosclerotic heart disease of native coronary artery without angina pectoris: Secondary | ICD-10-CM | POA: Diagnosis not present

## 2020-12-01 DIAGNOSIS — I252 Old myocardial infarction: Secondary | ICD-10-CM | POA: Diagnosis not present

## 2020-12-01 DIAGNOSIS — I4891 Unspecified atrial fibrillation: Secondary | ICD-10-CM | POA: Diagnosis not present

## 2020-12-01 DIAGNOSIS — Z9981 Dependence on supplemental oxygen: Secondary | ICD-10-CM | POA: Diagnosis not present

## 2020-12-01 DIAGNOSIS — G8929 Other chronic pain: Secondary | ICD-10-CM | POA: Diagnosis not present

## 2020-12-01 DIAGNOSIS — J9611 Chronic respiratory failure with hypoxia: Secondary | ICD-10-CM | POA: Diagnosis not present

## 2020-12-01 DIAGNOSIS — Z7982 Long term (current) use of aspirin: Secondary | ICD-10-CM | POA: Diagnosis not present

## 2020-12-01 DIAGNOSIS — F32A Depression, unspecified: Secondary | ICD-10-CM | POA: Diagnosis not present

## 2020-12-01 DIAGNOSIS — S51011D Laceration without foreign body of right elbow, subsequent encounter: Secondary | ICD-10-CM | POA: Diagnosis not present

## 2020-12-01 DIAGNOSIS — M069 Rheumatoid arthritis, unspecified: Secondary | ICD-10-CM | POA: Diagnosis not present

## 2020-12-01 DIAGNOSIS — N139 Obstructive and reflux uropathy, unspecified: Secondary | ICD-10-CM | POA: Diagnosis not present

## 2020-12-01 DIAGNOSIS — Z87891 Personal history of nicotine dependence: Secondary | ICD-10-CM | POA: Diagnosis not present

## 2020-12-01 DIAGNOSIS — S51811D Laceration without foreign body of right forearm, subsequent encounter: Secondary | ICD-10-CM | POA: Diagnosis not present

## 2020-12-01 DIAGNOSIS — J8410 Pulmonary fibrosis, unspecified: Secondary | ICD-10-CM | POA: Diagnosis not present

## 2020-12-08 DIAGNOSIS — F32A Depression, unspecified: Secondary | ICD-10-CM | POA: Diagnosis not present

## 2020-12-08 DIAGNOSIS — I10 Essential (primary) hypertension: Secondary | ICD-10-CM | POA: Diagnosis not present

## 2020-12-08 DIAGNOSIS — G8929 Other chronic pain: Secondary | ICD-10-CM | POA: Diagnosis not present

## 2020-12-08 DIAGNOSIS — Z87891 Personal history of nicotine dependence: Secondary | ICD-10-CM | POA: Diagnosis not present

## 2020-12-08 DIAGNOSIS — Z7982 Long term (current) use of aspirin: Secondary | ICD-10-CM | POA: Diagnosis not present

## 2020-12-08 DIAGNOSIS — J8410 Pulmonary fibrosis, unspecified: Secondary | ICD-10-CM | POA: Diagnosis not present

## 2020-12-08 DIAGNOSIS — I252 Old myocardial infarction: Secondary | ICD-10-CM | POA: Diagnosis not present

## 2020-12-08 DIAGNOSIS — M069 Rheumatoid arthritis, unspecified: Secondary | ICD-10-CM | POA: Diagnosis not present

## 2020-12-08 DIAGNOSIS — I350 Nonrheumatic aortic (valve) stenosis: Secondary | ICD-10-CM | POA: Diagnosis not present

## 2020-12-08 DIAGNOSIS — K219 Gastro-esophageal reflux disease without esophagitis: Secondary | ICD-10-CM | POA: Diagnosis not present

## 2020-12-08 DIAGNOSIS — D509 Iron deficiency anemia, unspecified: Secondary | ICD-10-CM | POA: Diagnosis not present

## 2020-12-08 DIAGNOSIS — N139 Obstructive and reflux uropathy, unspecified: Secondary | ICD-10-CM | POA: Diagnosis not present

## 2020-12-08 DIAGNOSIS — S51011D Laceration without foreign body of right elbow, subsequent encounter: Secondary | ICD-10-CM | POA: Diagnosis not present

## 2020-12-08 DIAGNOSIS — J9611 Chronic respiratory failure with hypoxia: Secondary | ICD-10-CM | POA: Diagnosis not present

## 2020-12-08 DIAGNOSIS — I2510 Atherosclerotic heart disease of native coronary artery without angina pectoris: Secondary | ICD-10-CM | POA: Diagnosis not present

## 2020-12-08 DIAGNOSIS — Z9981 Dependence on supplemental oxygen: Secondary | ICD-10-CM | POA: Diagnosis not present

## 2020-12-08 DIAGNOSIS — I4891 Unspecified atrial fibrillation: Secondary | ICD-10-CM | POA: Diagnosis not present

## 2020-12-08 DIAGNOSIS — S51811D Laceration without foreign body of right forearm, subsequent encounter: Secondary | ICD-10-CM | POA: Diagnosis not present

## 2020-12-08 DIAGNOSIS — E7800 Pure hypercholesterolemia, unspecified: Secondary | ICD-10-CM | POA: Diagnosis not present

## 2020-12-15 DIAGNOSIS — F32A Depression, unspecified: Secondary | ICD-10-CM | POA: Diagnosis not present

## 2020-12-15 DIAGNOSIS — N139 Obstructive and reflux uropathy, unspecified: Secondary | ICD-10-CM | POA: Diagnosis not present

## 2020-12-15 DIAGNOSIS — S51811D Laceration without foreign body of right forearm, subsequent encounter: Secondary | ICD-10-CM | POA: Diagnosis not present

## 2020-12-15 DIAGNOSIS — I252 Old myocardial infarction: Secondary | ICD-10-CM | POA: Diagnosis not present

## 2020-12-15 DIAGNOSIS — I4891 Unspecified atrial fibrillation: Secondary | ICD-10-CM | POA: Diagnosis not present

## 2020-12-15 DIAGNOSIS — Z7982 Long term (current) use of aspirin: Secondary | ICD-10-CM | POA: Diagnosis not present

## 2020-12-15 DIAGNOSIS — D509 Iron deficiency anemia, unspecified: Secondary | ICD-10-CM | POA: Diagnosis not present

## 2020-12-15 DIAGNOSIS — S51011D Laceration without foreign body of right elbow, subsequent encounter: Secondary | ICD-10-CM | POA: Diagnosis not present

## 2020-12-15 DIAGNOSIS — M069 Rheumatoid arthritis, unspecified: Secondary | ICD-10-CM | POA: Diagnosis not present

## 2020-12-15 DIAGNOSIS — I10 Essential (primary) hypertension: Secondary | ICD-10-CM | POA: Diagnosis not present

## 2020-12-15 DIAGNOSIS — J9611 Chronic respiratory failure with hypoxia: Secondary | ICD-10-CM | POA: Diagnosis not present

## 2020-12-15 DIAGNOSIS — I350 Nonrheumatic aortic (valve) stenosis: Secondary | ICD-10-CM | POA: Diagnosis not present

## 2020-12-15 DIAGNOSIS — J8410 Pulmonary fibrosis, unspecified: Secondary | ICD-10-CM | POA: Diagnosis not present

## 2020-12-15 DIAGNOSIS — E7800 Pure hypercholesterolemia, unspecified: Secondary | ICD-10-CM | POA: Diagnosis not present

## 2020-12-15 DIAGNOSIS — Z9981 Dependence on supplemental oxygen: Secondary | ICD-10-CM | POA: Diagnosis not present

## 2020-12-15 DIAGNOSIS — Z87891 Personal history of nicotine dependence: Secondary | ICD-10-CM | POA: Diagnosis not present

## 2020-12-15 DIAGNOSIS — G8929 Other chronic pain: Secondary | ICD-10-CM | POA: Diagnosis not present

## 2020-12-15 DIAGNOSIS — I2510 Atherosclerotic heart disease of native coronary artery without angina pectoris: Secondary | ICD-10-CM | POA: Diagnosis not present

## 2020-12-15 DIAGNOSIS — K219 Gastro-esophageal reflux disease without esophagitis: Secondary | ICD-10-CM | POA: Diagnosis not present

## 2020-12-20 DIAGNOSIS — J449 Chronic obstructive pulmonary disease, unspecified: Secondary | ICD-10-CM | POA: Diagnosis not present

## 2020-12-22 DIAGNOSIS — S51811D Laceration without foreign body of right forearm, subsequent encounter: Secondary | ICD-10-CM | POA: Diagnosis not present

## 2020-12-22 DIAGNOSIS — N139 Obstructive and reflux uropathy, unspecified: Secondary | ICD-10-CM | POA: Diagnosis not present

## 2020-12-22 DIAGNOSIS — I2510 Atherosclerotic heart disease of native coronary artery without angina pectoris: Secondary | ICD-10-CM | POA: Diagnosis not present

## 2020-12-22 DIAGNOSIS — S51011D Laceration without foreign body of right elbow, subsequent encounter: Secondary | ICD-10-CM | POA: Diagnosis not present

## 2020-12-22 DIAGNOSIS — J8410 Pulmonary fibrosis, unspecified: Secondary | ICD-10-CM | POA: Diagnosis not present

## 2020-12-22 DIAGNOSIS — D509 Iron deficiency anemia, unspecified: Secondary | ICD-10-CM | POA: Diagnosis not present

## 2020-12-22 DIAGNOSIS — G8929 Other chronic pain: Secondary | ICD-10-CM | POA: Diagnosis not present

## 2020-12-22 DIAGNOSIS — Z87891 Personal history of nicotine dependence: Secondary | ICD-10-CM | POA: Diagnosis not present

## 2020-12-22 DIAGNOSIS — E7800 Pure hypercholesterolemia, unspecified: Secondary | ICD-10-CM | POA: Diagnosis not present

## 2020-12-22 DIAGNOSIS — F32A Depression, unspecified: Secondary | ICD-10-CM | POA: Diagnosis not present

## 2020-12-22 DIAGNOSIS — I252 Old myocardial infarction: Secondary | ICD-10-CM | POA: Diagnosis not present

## 2020-12-22 DIAGNOSIS — M069 Rheumatoid arthritis, unspecified: Secondary | ICD-10-CM | POA: Diagnosis not present

## 2020-12-22 DIAGNOSIS — Z9981 Dependence on supplemental oxygen: Secondary | ICD-10-CM | POA: Diagnosis not present

## 2020-12-22 DIAGNOSIS — J9611 Chronic respiratory failure with hypoxia: Secondary | ICD-10-CM | POA: Diagnosis not present

## 2020-12-22 DIAGNOSIS — Z7982 Long term (current) use of aspirin: Secondary | ICD-10-CM | POA: Diagnosis not present

## 2020-12-22 DIAGNOSIS — I4891 Unspecified atrial fibrillation: Secondary | ICD-10-CM | POA: Diagnosis not present

## 2020-12-22 DIAGNOSIS — I350 Nonrheumatic aortic (valve) stenosis: Secondary | ICD-10-CM | POA: Diagnosis not present

## 2020-12-22 DIAGNOSIS — K219 Gastro-esophageal reflux disease without esophagitis: Secondary | ICD-10-CM | POA: Diagnosis not present

## 2020-12-22 DIAGNOSIS — I10 Essential (primary) hypertension: Secondary | ICD-10-CM | POA: Diagnosis not present

## 2020-12-23 DIAGNOSIS — J9611 Chronic respiratory failure with hypoxia: Secondary | ICD-10-CM | POA: Diagnosis not present

## 2020-12-23 DIAGNOSIS — J449 Chronic obstructive pulmonary disease, unspecified: Secondary | ICD-10-CM | POA: Diagnosis not present

## 2020-12-28 ENCOUNTER — Telehealth: Payer: Self-pay | Admitting: Interventional Cardiology

## 2020-12-28 DIAGNOSIS — D509 Iron deficiency anemia, unspecified: Secondary | ICD-10-CM | POA: Diagnosis not present

## 2020-12-28 DIAGNOSIS — J9611 Chronic respiratory failure with hypoxia: Secondary | ICD-10-CM | POA: Diagnosis not present

## 2020-12-28 DIAGNOSIS — I350 Nonrheumatic aortic (valve) stenosis: Secondary | ICD-10-CM | POA: Diagnosis not present

## 2020-12-28 DIAGNOSIS — I10 Essential (primary) hypertension: Secondary | ICD-10-CM | POA: Diagnosis not present

## 2020-12-28 DIAGNOSIS — Z9981 Dependence on supplemental oxygen: Secondary | ICD-10-CM | POA: Diagnosis not present

## 2020-12-28 DIAGNOSIS — I252 Old myocardial infarction: Secondary | ICD-10-CM | POA: Diagnosis not present

## 2020-12-28 DIAGNOSIS — S51011D Laceration without foreign body of right elbow, subsequent encounter: Secondary | ICD-10-CM | POA: Diagnosis not present

## 2020-12-28 DIAGNOSIS — E7800 Pure hypercholesterolemia, unspecified: Secondary | ICD-10-CM | POA: Diagnosis not present

## 2020-12-28 DIAGNOSIS — I4891 Unspecified atrial fibrillation: Secondary | ICD-10-CM | POA: Diagnosis not present

## 2020-12-28 DIAGNOSIS — G8929 Other chronic pain: Secondary | ICD-10-CM | POA: Diagnosis not present

## 2020-12-28 DIAGNOSIS — Z7982 Long term (current) use of aspirin: Secondary | ICD-10-CM | POA: Diagnosis not present

## 2020-12-28 DIAGNOSIS — F32A Depression, unspecified: Secondary | ICD-10-CM | POA: Diagnosis not present

## 2020-12-28 DIAGNOSIS — K219 Gastro-esophageal reflux disease without esophagitis: Secondary | ICD-10-CM | POA: Diagnosis not present

## 2020-12-28 DIAGNOSIS — Z87891 Personal history of nicotine dependence: Secondary | ICD-10-CM | POA: Diagnosis not present

## 2020-12-28 DIAGNOSIS — J8410 Pulmonary fibrosis, unspecified: Secondary | ICD-10-CM | POA: Diagnosis not present

## 2020-12-28 DIAGNOSIS — N139 Obstructive and reflux uropathy, unspecified: Secondary | ICD-10-CM | POA: Diagnosis not present

## 2020-12-28 DIAGNOSIS — S51811D Laceration without foreign body of right forearm, subsequent encounter: Secondary | ICD-10-CM | POA: Diagnosis not present

## 2020-12-28 DIAGNOSIS — M069 Rheumatoid arthritis, unspecified: Secondary | ICD-10-CM | POA: Diagnosis not present

## 2020-12-28 DIAGNOSIS — I2510 Atherosclerotic heart disease of native coronary artery without angina pectoris: Secondary | ICD-10-CM | POA: Diagnosis not present

## 2020-12-28 NOTE — Telephone Encounter (Signed)
Decrease metoprolol to 12.5 mg BID

## 2020-12-28 NOTE — Telephone Encounter (Signed)
Status change hypo tension systolic is running in the low 63J and diastolic in the low 50. Drinking plenty of fluids not dehydrated

## 2020-12-28 NOTE — Telephone Encounter (Signed)
Spoke with wife, DPR on file.  She states [place] has been there twice in the last couple of weeks and BP was 90s/low 50s.  States it's usually 120s/60-70.  Weight is stable in the 160s and he hydrates well.  Currently only on Metoprolol Tartrate 25mg  BID.  Wife mentions that since he took Paxlovid in June when they had covid, his dementia has worsened quite a bit.  Asked if he ever complains of dizziness or lightheadedness and she states "yes, but if you ask him if he has something, he says he's got it".  Advised I will send message to Dr. Tamala Julian for review.

## 2020-12-29 DIAGNOSIS — H0102A Squamous blepharitis right eye, upper and lower eyelids: Secondary | ICD-10-CM | POA: Diagnosis not present

## 2020-12-29 DIAGNOSIS — H353111 Nonexudative age-related macular degeneration, right eye, early dry stage: Secondary | ICD-10-CM | POA: Diagnosis not present

## 2020-12-29 DIAGNOSIS — H35033 Hypertensive retinopathy, bilateral: Secondary | ICD-10-CM | POA: Diagnosis not present

## 2020-12-29 DIAGNOSIS — H11153 Pinguecula, bilateral: Secondary | ICD-10-CM | POA: Diagnosis not present

## 2020-12-29 DIAGNOSIS — H532 Diplopia: Secondary | ICD-10-CM | POA: Diagnosis not present

## 2020-12-29 DIAGNOSIS — H353122 Nonexudative age-related macular degeneration, left eye, intermediate dry stage: Secondary | ICD-10-CM | POA: Diagnosis not present

## 2020-12-29 DIAGNOSIS — H40013 Open angle with borderline findings, low risk, bilateral: Secondary | ICD-10-CM | POA: Diagnosis not present

## 2020-12-29 DIAGNOSIS — H04123 Dry eye syndrome of bilateral lacrimal glands: Secondary | ICD-10-CM | POA: Diagnosis not present

## 2020-12-29 DIAGNOSIS — H0102B Squamous blepharitis left eye, upper and lower eyelids: Secondary | ICD-10-CM | POA: Diagnosis not present

## 2020-12-29 DIAGNOSIS — H43812 Vitreous degeneration, left eye: Secondary | ICD-10-CM | POA: Diagnosis not present

## 2020-12-29 MED ORDER — METOPROLOL TARTRATE 25 MG PO TABS: 12.5000 mg | ORAL_TABLET | Freq: Two times a day (BID) | ORAL | 3 refills | Status: AC

## 2020-12-29 NOTE — Telephone Encounter (Signed)
Left message to call back  

## 2020-12-29 NOTE — Telephone Encounter (Signed)
Spoke with pt's wife and made her aware of recommendations per Dr. Tamala Julian.  Advised to monitor BP and report if no improvement after a couple of weeks.  Wife verbalized understanding and was in agreement with plan.

## 2021-01-12 DIAGNOSIS — Z23 Encounter for immunization: Secondary | ICD-10-CM | POA: Diagnosis not present

## 2021-01-12 DIAGNOSIS — J9611 Chronic respiratory failure with hypoxia: Secondary | ICD-10-CM | POA: Diagnosis not present

## 2021-01-12 DIAGNOSIS — I10 Essential (primary) hypertension: Secondary | ICD-10-CM | POA: Diagnosis not present

## 2021-01-12 DIAGNOSIS — G301 Alzheimer's disease with late onset: Secondary | ICD-10-CM | POA: Diagnosis not present

## 2021-01-12 DIAGNOSIS — J8410 Pulmonary fibrosis, unspecified: Secondary | ICD-10-CM | POA: Diagnosis not present

## 2021-01-12 DIAGNOSIS — Z79899 Other long term (current) drug therapy: Secondary | ICD-10-CM | POA: Diagnosis not present

## 2021-01-12 DIAGNOSIS — I700 Atherosclerosis of aorta: Secondary | ICD-10-CM | POA: Diagnosis not present

## 2021-01-12 DIAGNOSIS — I480 Paroxysmal atrial fibrillation: Secondary | ICD-10-CM | POA: Diagnosis not present

## 2021-01-19 DIAGNOSIS — J9611 Chronic respiratory failure with hypoxia: Secondary | ICD-10-CM | POA: Diagnosis not present

## 2021-01-19 DIAGNOSIS — J449 Chronic obstructive pulmonary disease, unspecified: Secondary | ICD-10-CM | POA: Diagnosis not present

## 2021-01-21 DIAGNOSIS — Z79899 Other long term (current) drug therapy: Secondary | ICD-10-CM | POA: Diagnosis not present

## 2021-01-21 DIAGNOSIS — H353132 Nonexudative age-related macular degeneration, bilateral, intermediate dry stage: Secondary | ICD-10-CM | POA: Diagnosis not present

## 2021-01-21 DIAGNOSIS — H40013 Open angle with borderline findings, low risk, bilateral: Secondary | ICD-10-CM | POA: Diagnosis not present

## 2021-01-21 DIAGNOSIS — H04123 Dry eye syndrome of bilateral lacrimal glands: Secondary | ICD-10-CM | POA: Diagnosis not present

## 2021-01-21 DIAGNOSIS — M0689 Other specified rheumatoid arthritis, multiple sites: Secondary | ICD-10-CM | POA: Diagnosis not present

## 2021-01-22 DIAGNOSIS — J9611 Chronic respiratory failure with hypoxia: Secondary | ICD-10-CM | POA: Diagnosis not present

## 2021-01-22 DIAGNOSIS — J449 Chronic obstructive pulmonary disease, unspecified: Secondary | ICD-10-CM | POA: Diagnosis not present

## 2021-02-01 DIAGNOSIS — Z471 Aftercare following joint replacement surgery: Secondary | ICD-10-CM | POA: Diagnosis not present

## 2021-02-01 DIAGNOSIS — Z79899 Other long term (current) drug therapy: Secondary | ICD-10-CM | POA: Diagnosis not present

## 2021-02-01 DIAGNOSIS — M069 Rheumatoid arthritis, unspecified: Secondary | ICD-10-CM | POA: Diagnosis not present

## 2021-02-01 DIAGNOSIS — Z87891 Personal history of nicotine dependence: Secondary | ICD-10-CM | POA: Diagnosis not present

## 2021-02-01 DIAGNOSIS — J8410 Pulmonary fibrosis, unspecified: Secondary | ICD-10-CM | POA: Diagnosis not present

## 2021-02-01 DIAGNOSIS — I2510 Atherosclerotic heart disease of native coronary artery without angina pectoris: Secondary | ICD-10-CM | POA: Diagnosis not present

## 2021-02-01 DIAGNOSIS — Z885 Allergy status to narcotic agent status: Secondary | ICD-10-CM | POA: Diagnosis not present

## 2021-02-01 DIAGNOSIS — I252 Old myocardial infarction: Secondary | ICD-10-CM | POA: Diagnosis not present

## 2021-02-01 DIAGNOSIS — J849 Interstitial pulmonary disease, unspecified: Secondary | ICD-10-CM | POA: Diagnosis not present

## 2021-02-01 DIAGNOSIS — T84020A Dislocation of internal right hip prosthesis, initial encounter: Secondary | ICD-10-CM | POA: Diagnosis not present

## 2021-02-01 DIAGNOSIS — M25551 Pain in right hip: Secondary | ICD-10-CM | POA: Diagnosis not present

## 2021-02-01 DIAGNOSIS — Z96641 Presence of right artificial hip joint: Secondary | ICD-10-CM | POA: Diagnosis not present

## 2021-02-01 DIAGNOSIS — Z96643 Presence of artificial hip joint, bilateral: Secondary | ICD-10-CM | POA: Diagnosis not present

## 2021-02-01 DIAGNOSIS — X501XXA Overexertion from prolonged static or awkward postures, initial encounter: Secondary | ICD-10-CM | POA: Diagnosis not present

## 2021-02-01 DIAGNOSIS — Z7982 Long term (current) use of aspirin: Secondary | ICD-10-CM | POA: Diagnosis not present

## 2021-02-01 DIAGNOSIS — R079 Chest pain, unspecified: Secondary | ICD-10-CM | POA: Diagnosis not present

## 2021-02-01 DIAGNOSIS — Z791 Long term (current) use of non-steroidal anti-inflammatories (NSAID): Secondary | ICD-10-CM | POA: Diagnosis not present

## 2021-02-01 DIAGNOSIS — Z955 Presence of coronary angioplasty implant and graft: Secondary | ICD-10-CM | POA: Diagnosis not present

## 2021-02-01 DIAGNOSIS — S73004A Unspecified dislocation of right hip, initial encounter: Secondary | ICD-10-CM | POA: Diagnosis not present

## 2021-02-03 DIAGNOSIS — S73014A Posterior dislocation of right hip, initial encounter: Secondary | ICD-10-CM | POA: Diagnosis not present

## 2021-02-03 DIAGNOSIS — M25551 Pain in right hip: Secondary | ICD-10-CM | POA: Diagnosis not present

## 2021-02-03 DIAGNOSIS — S73004A Unspecified dislocation of right hip, initial encounter: Secondary | ICD-10-CM | POA: Diagnosis not present

## 2021-02-10 DIAGNOSIS — Z791 Long term (current) use of non-steroidal anti-inflammatories (NSAID): Secondary | ICD-10-CM | POA: Diagnosis not present

## 2021-02-10 DIAGNOSIS — Z79899 Other long term (current) drug therapy: Secondary | ICD-10-CM | POA: Diagnosis not present

## 2021-02-10 DIAGNOSIS — I2510 Atherosclerotic heart disease of native coronary artery without angina pectoris: Secondary | ICD-10-CM | POA: Diagnosis not present

## 2021-02-10 DIAGNOSIS — S73004D Unspecified dislocation of right hip, subsequent encounter: Secondary | ICD-10-CM | POA: Diagnosis not present

## 2021-02-10 DIAGNOSIS — W07XXXA Fall from chair, initial encounter: Secondary | ICD-10-CM | POA: Diagnosis not present

## 2021-02-10 DIAGNOSIS — M25551 Pain in right hip: Secondary | ICD-10-CM | POA: Diagnosis not present

## 2021-02-10 DIAGNOSIS — Z885 Allergy status to narcotic agent status: Secondary | ICD-10-CM | POA: Diagnosis not present

## 2021-02-10 DIAGNOSIS — Z96643 Presence of artificial hip joint, bilateral: Secondary | ICD-10-CM | POA: Diagnosis not present

## 2021-02-10 DIAGNOSIS — Z7982 Long term (current) use of aspirin: Secondary | ICD-10-CM | POA: Diagnosis not present

## 2021-02-10 DIAGNOSIS — T84020A Dislocation of internal right hip prosthesis, initial encounter: Secondary | ICD-10-CM | POA: Diagnosis not present

## 2021-02-14 DIAGNOSIS — J8410 Pulmonary fibrosis, unspecified: Secondary | ICD-10-CM | POA: Diagnosis not present

## 2021-02-14 DIAGNOSIS — M0579 Rheumatoid arthritis with rheumatoid factor of multiple sites without organ or systems involvement: Secondary | ICD-10-CM | POA: Diagnosis not present

## 2021-02-14 DIAGNOSIS — M2550 Pain in unspecified joint: Secondary | ICD-10-CM | POA: Diagnosis not present

## 2021-02-14 DIAGNOSIS — Z79899 Other long term (current) drug therapy: Secondary | ICD-10-CM | POA: Diagnosis not present

## 2021-02-14 DIAGNOSIS — M159 Polyosteoarthritis, unspecified: Secondary | ICD-10-CM | POA: Diagnosis not present

## 2021-02-19 DIAGNOSIS — J9611 Chronic respiratory failure with hypoxia: Secondary | ICD-10-CM | POA: Diagnosis not present

## 2021-02-19 DIAGNOSIS — J449 Chronic obstructive pulmonary disease, unspecified: Secondary | ICD-10-CM | POA: Diagnosis not present

## 2021-02-22 DIAGNOSIS — J9611 Chronic respiratory failure with hypoxia: Secondary | ICD-10-CM | POA: Diagnosis not present

## 2021-02-22 DIAGNOSIS — J449 Chronic obstructive pulmonary disease, unspecified: Secondary | ICD-10-CM | POA: Diagnosis not present

## 2021-03-21 DIAGNOSIS — J9611 Chronic respiratory failure with hypoxia: Secondary | ICD-10-CM | POA: Diagnosis not present

## 2021-03-21 DIAGNOSIS — J449 Chronic obstructive pulmonary disease, unspecified: Secondary | ICD-10-CM | POA: Diagnosis not present

## 2021-03-24 DIAGNOSIS — J9611 Chronic respiratory failure with hypoxia: Secondary | ICD-10-CM | POA: Diagnosis not present

## 2021-03-24 DIAGNOSIS — J449 Chronic obstructive pulmonary disease, unspecified: Secondary | ICD-10-CM | POA: Diagnosis not present

## 2021-04-13 DIAGNOSIS — I10 Essential (primary) hypertension: Secondary | ICD-10-CM | POA: Diagnosis not present

## 2021-04-13 DIAGNOSIS — F325 Major depressive disorder, single episode, in full remission: Secondary | ICD-10-CM | POA: Diagnosis not present

## 2021-04-13 DIAGNOSIS — E7800 Pure hypercholesterolemia, unspecified: Secondary | ICD-10-CM | POA: Diagnosis not present

## 2021-04-13 DIAGNOSIS — I25118 Atherosclerotic heart disease of native coronary artery with other forms of angina pectoris: Secondary | ICD-10-CM | POA: Diagnosis not present

## 2021-04-18 ENCOUNTER — Ambulatory Visit: Payer: Medicare Other | Admitting: Podiatry

## 2021-04-18 ENCOUNTER — Other Ambulatory Visit: Payer: Self-pay

## 2021-04-18 DIAGNOSIS — M79674 Pain in right toe(s): Secondary | ICD-10-CM

## 2021-04-18 DIAGNOSIS — B351 Tinea unguium: Secondary | ICD-10-CM | POA: Diagnosis not present

## 2021-04-18 DIAGNOSIS — M79675 Pain in left toe(s): Secondary | ICD-10-CM

## 2021-04-22 NOTE — Progress Notes (Signed)
Subjective:   Patient ID: Unknown Jim, male   DOB: 88 y.o.   MRN: [id number]   HPI 88 year old male presents the office with a family member for concerns of nails becoming thick, elongated he is not able to trim himself.  Denies any open sores.  His family numbers concern about some bruise on the right foot but no recent falls.  He was seen by his primary care physician and he is coming from the brace that he was wearing.  He wears a brace on the right side for chronic hip dislocations.   Review of Systems  All other systems reviewed and are negative.  Past Medical History:  Diagnosis Date   AAA (abdominal aortic aneurysm) ([place])    Anemia    Arthritis    ra   Atrial fibrillation (HCC)    COPD (chronic obstructive pulmonary disease) ([place]) Jan.2016   Small amount of Emphysema- Pulmonary Fibrosis ( Feb. 9, 2016 )   Coronary atherosclerosis    Dysrhythmia    A-Fib   Hematuria, gross    HISTORY OF  ?  2004   Hypertension    dr Daneen Schick   Immune disorder [facility])    Myocardial infarction ([place]) 2000   Pulmonary fibrosis ([place])    Rheumatoid arthritis ([place])    Shortness of breath    with exertion   Staph skin infection    Back    Past Surgical History:  Procedure Laterality Date   ABDOMINAL AORTIC ENDOVASCULAR STENT GRAFT N/A 01/06/2016   Procedure: ABDOMINAL AORTIC ENDOVASCULAR STENT GRAFT;  Surgeon: Waynetta Sandy, MD;  Location: [place];  Service: Vascular;  Laterality: N/A;   arm surgery     result of staph infection   BACK SURGERY     CORONARY ANGIOPLASTY     ESOPHAGOGASTRODUODENOSCOPY  2004    ?   esophagus wrapped     EYE SURGERY Bilateral 03-14-13   [place]   HAND SURGERY     rel to [place] Right 06/30/2020   Procedure: CLOSED REDUCTION HIP;  Surgeon: Carole Civil, MD;  Location: [facility];  Service: Orthopedics;  Laterality: Right;   JOINT REPLACEMENT     bil hips   JOINT REPLACEMENT Right 01-20-13   Knee   stents  2000    TOTAL KNEE ARTHROPLASTY Right 01/20/2013   Procedure: RIGHT TOTAL KNEE ARTHROPLASTY;  Surgeon: Kerin Salen, MD;  Location: [place];  Service: Orthopedics;  Laterality: Right;     Current Outpatient Medications:    Ascorbic Acid (VITAMIN C) 500 MG CAPS, Take 1 tablet by mouth daily., Disp: , Rfl:    aspirin 81 MG tablet, Take 81 mg by mouth daily., Disp: , Rfl:    atorvastatin (LIPITOR) 40 MG tablet, Take 40 mg by mouth daily., Disp: , Rfl:    calcium carbonate (OS-CAL) 600 MG TABS, Take 600 mg by mouth daily., Disp: , Rfl:    carboxymethylcellulose (REFRESH PLUS) 0.5 % SOLN, 1 drop 3 (three) times daily as needed (dry eyes)., Disp: , Rfl:    cholecalciferol (VITAMIN D3) 25 MCG (1000 UNIT) tablet, Take 1 tablet by mouth daily., Disp: , Rfl:    DULoxetine (CYMBALTA) 30 MG capsule, Take 30 mg by mouth daily., Disp: , Rfl:    famotidine (PEPCID) 40 MG tablet, Take 40 mg by mouth daily., Disp: , Rfl:    ferrous sulfate 325 (65 FE) MG tablet, Take 325 mg by mouth daily with breakfast., Disp: ,  Rfl:    hydroxychloroquine (PLAQUENIL) 200 MG tablet, Take 200 mg by mouth daily., Disp: , Rfl:    metoprolol tartrate (LOPRESSOR) 25 MG tablet, Take 0.5 tablets (12.5 mg total) by mouth 2 (two) times daily., Disp: 90 tablet, Rfl: 3   Multiple Vitamin (MULTIVITAMIN) capsule, Take 1 capsule by mouth daily., Disp: , Rfl:    Multiple Vitamins-Minerals (OCUVITE PRESERVISION PO), Take 1 tablet by mouth daily., Disp: , Rfl:    nitroGLYCERIN (NITROSTAT) 0.4 MG SL tablet, Place 1 tablet (0.4 mg total) under the tongue every 5 (five) minutes as needed for chest pain., Disp: 25 tablet, Rfl: 3   Omega-3 Fatty Acids (FISH OIL) 1000 MG CAPS, Take 1 capsule by mouth daily., Disp: , Rfl:    predniSONE (DELTASONE) 5 MG tablet, Take 5-10 mg by mouth daily., Disp: , Rfl:    sulfaSALAzine (AZULFIDINE) 500 MG tablet, Take 1,000 mg by mouth 2 (two) times daily. , Disp: , Rfl:    tamsulosin (FLOMAX) 0.4 MG CAPS capsule, Take 0.4 mg  by mouth daily., Disp: , Rfl:    traMADol (ULTRAM) 50 MG tablet, Take 1 tablet (50 mg total) by mouth every 6 (six) hours., Disp: 30 tablet, Rfl: 0   Zinc 50 MG TABS, Take 1 tablet by mouth daily., Disp: , Rfl:   Allergies  Allergen Reactions   Morphine Other (See Comments)    "makes me crazy"   Morphine And Related Nausea And Vomiting and Other (See Comments)    Hallucinations    Nirmatrelvir-Ritonavir Other (See Comments)    Other reaction(s): confusion           Objective:  Physical Exam  General: AAO x3, NAD  Dermatological: Nails are hypertrophic, dystrophic, brittle, discolored, elongated 10. No surrounding redness or drainage. Tenderness nails 1-5 bilaterally.  Mild bruising noted to the lateral aspect of the foot as well as the first MPJ but there is no open sores.  No open lesions or pre-ulcerative lesions are identified today.   Vascular: Dorsalis Pedis artery and Posterior Tibial artery pedal pulses are palpable bilateral with immedate capillary fill time.  There is no pain with calf compression, swelling, warmth, erythema.   Neruologic: Grossly intact via light touch bilateral.   Musculoskeletal: Tenderness to the toenail sites.  No other areas of discomfort in particular no other area of pinpoint tenderness and no pain on the area of the bruises.    Gait: Unassisted, Nonantalgic.       Assessment:   Symptomatic onychomycosis     Plan:  -Treatment options discussed including all alternatives, risks, and complications -Etiology of symptoms were discussed -Nails debrided 10 without complications or bleeding. -Offered to get x-rays for the bruising.  Will monitor there is any pain or worsening let me know. -Daily foot inspection -Follow-up in 3 months or sooner if any problems arise. In the meantime, encouraged to call the office with any questions, concerns, change in symptoms.   Celesta Gentile, DPM

## 2021-04-25 DIAGNOSIS — I00 Rheumatic fever without heart involvement: Secondary | ICD-10-CM | POA: Diagnosis not present

## 2021-04-25 DIAGNOSIS — M25552 Pain in left hip: Secondary | ICD-10-CM | POA: Diagnosis not present

## 2021-04-25 DIAGNOSIS — W010XXA Fall on same level from slipping, tripping and stumbling without subsequent striking against object, initial encounter: Secondary | ICD-10-CM | POA: Diagnosis not present

## 2021-04-25 DIAGNOSIS — J189 Pneumonia, unspecified organism: Secondary | ICD-10-CM | POA: Diagnosis not present

## 2021-04-25 DIAGNOSIS — E785 Hyperlipidemia, unspecified: Secondary | ICD-10-CM | POA: Diagnosis not present

## 2021-04-25 DIAGNOSIS — I2510 Atherosclerotic heart disease of native coronary artery without angina pectoris: Secondary | ICD-10-CM | POA: Diagnosis not present

## 2021-04-25 DIAGNOSIS — M25512 Pain in left shoulder: Secondary | ICD-10-CM | POA: Diagnosis not present

## 2021-04-25 DIAGNOSIS — S72002D Fracture of unspecified part of neck of left femur, subsequent encounter for closed fracture with routine healing: Secondary | ICD-10-CM | POA: Diagnosis not present

## 2021-04-25 DIAGNOSIS — M069 Rheumatoid arthritis, unspecified: Secondary | ICD-10-CM | POA: Diagnosis not present

## 2021-04-25 DIAGNOSIS — M9702XA Periprosthetic fracture around internal prosthetic left hip joint, initial encounter: Secondary | ICD-10-CM | POA: Diagnosis not present

## 2021-04-25 DIAGNOSIS — Z471 Aftercare following joint replacement surgery: Secondary | ICD-10-CM | POA: Diagnosis not present

## 2021-04-25 DIAGNOSIS — R0902 Hypoxemia: Secondary | ICD-10-CM | POA: Diagnosis not present

## 2021-04-25 DIAGNOSIS — Z96642 Presence of left artificial hip joint: Secondary | ICD-10-CM | POA: Diagnosis not present

## 2021-04-25 DIAGNOSIS — I480 Paroxysmal atrial fibrillation: Secondary | ICD-10-CM | POA: Diagnosis not present

## 2021-04-25 DIAGNOSIS — Z7952 Long term (current) use of systemic steroids: Secondary | ICD-10-CM | POA: Diagnosis not present

## 2021-04-25 DIAGNOSIS — J9610 Chronic respiratory failure, unspecified whether with hypoxia or hypercapnia: Secondary | ICD-10-CM | POA: Diagnosis not present

## 2021-04-25 DIAGNOSIS — Z87891 Personal history of nicotine dependence: Secondary | ICD-10-CM | POA: Diagnosis not present

## 2021-04-25 DIAGNOSIS — I252 Old myocardial infarction: Secondary | ICD-10-CM | POA: Diagnosis not present

## 2021-04-25 DIAGNOSIS — S72122A Displaced fracture of lesser trochanter of left femur, initial encounter for closed fracture: Secondary | ICD-10-CM | POA: Diagnosis not present

## 2021-04-25 DIAGNOSIS — S72112A Displaced fracture of greater trochanter of left femur, initial encounter for closed fracture: Secondary | ICD-10-CM | POA: Diagnosis not present

## 2021-04-25 DIAGNOSIS — S72002A Fracture of unspecified part of neck of left femur, initial encounter for closed fracture: Secondary | ICD-10-CM | POA: Diagnosis not present

## 2021-04-25 DIAGNOSIS — I5032 Chronic diastolic (congestive) heart failure: Secondary | ICD-10-CM | POA: Diagnosis not present

## 2021-04-25 DIAGNOSIS — J8410 Pulmonary fibrosis, unspecified: Secondary | ICD-10-CM | POA: Diagnosis not present

## 2021-04-25 DIAGNOSIS — Z20822 Contact with and (suspected) exposure to covid-19: Secondary | ICD-10-CM | POA: Diagnosis not present

## 2021-04-25 DIAGNOSIS — I110 Hypertensive heart disease with heart failure: Secondary | ICD-10-CM | POA: Diagnosis not present

## 2021-04-25 DIAGNOSIS — S41112D Laceration without foreign body of left upper arm, subsequent encounter: Secondary | ICD-10-CM | POA: Diagnosis not present

## 2021-04-25 DIAGNOSIS — F0390 Unspecified dementia without behavioral disturbance: Secondary | ICD-10-CM | POA: Diagnosis not present

## 2021-04-28 ENCOUNTER — Inpatient Hospital Stay
Admission: RE | Admit: 2021-04-28 | Discharge: 2021-05-01 | Disposition: A | Discharge status: Other Acute Inpatient Hospital | Payer: Medicare Other | Source: Ambulatory Visit | Attending: Internal Medicine | Admitting: Internal Medicine

## 2021-04-29 ENCOUNTER — Encounter: Payer: Self-pay | Admitting: Adult Health

## 2021-04-29 ENCOUNTER — Non-Acute Institutional Stay (SKILLED_NURSING_FACILITY): Payer: Medicare Other | Admitting: Adult Health

## 2021-04-29 DIAGNOSIS — J189 Pneumonia, unspecified organism: Secondary | ICD-10-CM | POA: Insufficient documentation

## 2021-04-29 DIAGNOSIS — E782 Mixed hyperlipidemia: Secondary | ICD-10-CM

## 2021-04-29 DIAGNOSIS — S72002A Fracture of unspecified part of neck of left femur, initial encounter for closed fracture: Secondary | ICD-10-CM | POA: Insufficient documentation

## 2021-04-29 DIAGNOSIS — S72002G Fracture of unspecified part of neck of left femur, subsequent encounter for closed fracture with delayed healing: Secondary | ICD-10-CM

## 2021-04-29 DIAGNOSIS — I480 Paroxysmal atrial fibrillation: Secondary | ICD-10-CM | POA: Diagnosis not present

## 2021-04-29 DIAGNOSIS — I10 Essential (primary) hypertension: Secondary | ICD-10-CM

## 2021-04-29 DIAGNOSIS — I5032 Chronic diastolic (congestive) heart failure: Secondary | ICD-10-CM

## 2021-04-29 DIAGNOSIS — F0150 Vascular dementia without behavioral disturbance: Secondary | ICD-10-CM

## 2021-04-29 DIAGNOSIS — I2510 Atherosclerotic heart disease of native coronary artery without angina pectoris: Secondary | ICD-10-CM | POA: Diagnosis not present

## 2021-04-29 DIAGNOSIS — F03918 Unspecified dementia, unspecified severity, with other behavioral disturbance: Secondary | ICD-10-CM | POA: Insufficient documentation

## 2021-04-29 DIAGNOSIS — J8410 Pulmonary fibrosis, unspecified: Secondary | ICD-10-CM

## 2021-04-29 DIAGNOSIS — M0520 Rheumatoid vasculitis with rheumatoid arthritis of unspecified site: Secondary | ICD-10-CM

## 2021-04-29 DIAGNOSIS — J9611 Chronic respiratory failure with hypoxia: Secondary | ICD-10-CM | POA: Insufficient documentation

## 2021-04-29 NOTE — Progress Notes (Signed)
Location:  [place] Room Number: 158-P Place of Service:  SNF (31) Provider: Ok Edwards, NP  CODE STATUS: DNR  Allergies  Allergen Reactions   Morphine Other (See Comments)    "makes me crazy"   Morphine And Related Nausea And Vomiting and Other (See Comments)    Hallucinations    Nirmatrelvir-Ritonavir Other (See Comments)    Other reaction(s): confusion     Chief Complaint  Patient presents with   Hospitalization Follow-up    Hospital Follow Up.    HPI:  He is a 88 year old man who has been hospitalized from 04-25-20 through 04-28-21. His medical history includes: bilateral hip replacements; afib; pulmonary fibrosis. He had a mechanical fall at home with a left hip fracture. He was deemed to be a poor surgical candidate so conservative treatment was started. He was treated for bilateral pneumonia. He is here for short term rehab with his goal to return home. There are no reports of uncontrolled pain; no reports of anxiety or agitation. He will continue to be followed for his chronic illnesses including: Pulmonary fibrosis associated with RA/ chronic respiratory failure with hypoxia: Paroxsymal atrial fibrillation: Essential hypertension:  Past Medical History:  Diagnosis Date   AAA (abdominal aortic aneurysm)    Anemia    Arthritis    ra   Atrial fibrillation (HCC)    COPD (chronic obstructive pulmonary disease) ([place]) Jan.2016   Small amount of Emphysema- Pulmonary Fibrosis ( Feb. 9, 2016 )   Coronary atherosclerosis    Dysrhythmia    A-Fib   Hematuria, gross    HISTORY OF  ?  2004   Hypertension    dr Daneen Schick   Immune disorder [facility])    Myocardial infarction ([place]) 2000   Pulmonary fibrosis ([place])    Rheumatoid arthritis ([place])    Shortness of breath    with exertion   Staph skin infection    Back    Past Surgical History:  Procedure Laterality Date   ABDOMINAL AORTIC ENDOVASCULAR STENT GRAFT N/A 01/06/2016   Procedure: ABDOMINAL  AORTIC ENDOVASCULAR STENT GRAFT;  Surgeon: Waynetta Sandy, MD;  Location: [facility];  Service: Vascular;  Laterality: N/A;   arm surgery     result of staph infection   BACK SURGERY     CORONARY ANGIOPLASTY     ESOPHAGOGASTRODUODENOSCOPY  2004    ?   esophagus wrapped     EYE SURGERY Bilateral 03-14-13   [place]   HAND SURGERY     rel to [place] Right 06/30/2020   Procedure: CLOSED REDUCTION HIP;  Surgeon: Carole Civil, MD;  Location[facility];  Service: Orthopedics;  Laterality: Right;   JOINT REPLACEMENT     bil hips   JOINT REPLACEMENT Right 01-20-13   Knee   stents  2000   TOTAL KNEE ARTHROPLASTY Right 01/20/2013   Procedure: RIGHT TOTAL KNEE ARTHROPLASTY;  Surgeon: Kerin Salen, MD;  Location: [place];  Service: Orthopedics;  Laterality: Right;    Social History   Socioeconomic History   Marital status: Married    Spouse name: Not on file   Number of children: Not on file   Years of education: Not on file   Highest education level: Not on file  Occupational History   Not on file  Tobacco Use   Smoking status: Former    Packs/day: 3.00    Years: 50.00    Pack years: 150.00  Types: Cigarettes    Quit date: 03/27/1978    Years since quitting: 43.1   Smokeless tobacco: Former    Types: Chew  Substance and Sexual Activity   Alcohol use: No    Alcohol/week: 0.0 standard drinks   Drug use: No   Sexual activity: Not on file  Other Topics Concern   Not on file  Social History Narrative   Not on file   Social Determinants of Health   Financial Resource Strain: Not on file  Food Insecurity: Not on file  Transportation Needs: Not on file  Physical Activity: Not on file  Stress: Not on file  Social Connections: Not on file  Intimate Partner Violence: Not on file   Family History  Problem Relation Age of Onset   Heart disease Brother        Heart Disease before age 92   Stroke Brother    Heart attack Brother    Hypertension  Brother    Diabetes Son    Heart disease Son        Heart Disease before age 38   Hypertension Son    Heart attack Son    Bleeding Disorder Father    COPD Father        Emphysema   Varicose Veins Sister    COPD Sister       VITAL SIGNS BP 94/69    Pulse 90    Temp 98 F (36.7 C)    Resp 20    Ht 6' (1.829 m)    SpO2 95%    BMI 21.97 kg/m   Outpatient Encounter Medications as of 04/29/2021  Medication Sig   acetaminophen (TYLENOL) 500 MG tablet Take 500 mg by mouth in the morning and at bedtime.   Ascorbic Acid (VITAMIN C) 500 MG CAPS Take 1 tablet by mouth daily.   aspirin 81 MG tablet Take 81 mg by mouth daily.   atorvastatin (LIPITOR) 40 MG tablet Take 40 mg by mouth daily.   calcium carbonate (OS-CAL) 600 MG TABS Take 600 mg by mouth daily.   carboxymethylcellulose (REFRESH PLUS) 0.5 % SOLN 1 drop 3 (three) times daily as needed (dry eyes).   cefdinir (OMNICEF) 300 MG capsule Take 300 mg by mouth 2 (two) times daily.   doxycycline (VIBRAMYCIN) 100 MG capsule Take 100 mg by mouth 2 (two) times daily.   DULoxetine (CYMBALTA) 30 MG capsule Take 30 mg by mouth daily.   ergocalciferol (VITAMIN D2) 1.25 MG (50000 UT) capsule Take 50,000 Units by mouth once a week. Sunday   famotidine (PEPCID) 40 MG tablet Take 40 mg by mouth daily.   ferrous sulfate 325 (65 FE) MG tablet Take 325 mg by mouth daily with breakfast.   hydroxychloroquine (PLAQUENIL) 200 MG tablet Take 200 mg by mouth daily.   melatonin 5 MG TABS Take 5 mg by mouth at bedtime.   metoprolol tartrate (LOPRESSOR) 25 MG tablet Take 0.5 tablets (12.5 mg total) by mouth 2 (two) times daily.   Multiple Vitamin (MULTIVITAMIN) capsule Take 1 capsule by mouth daily.   Multiple Vitamins-Minerals (OCUVITE PRESERVISION PO) Take 1 tablet by mouth daily.   Omega-3 Fatty Acids (FISH OIL) 1000 MG CAPS Take 1 capsule by mouth daily.   predniSONE (DELTASONE) 5 MG tablet Take 5 mg by mouth daily.   sulfaSALAzine (AZULFIDINE) 500 MG tablet  Take 1,000 mg by mouth in the morning, at noon, in the evening, and at bedtime.   tamsulosin (FLOMAX) 0.4 MG CAPS capsule  Take 0.4 mg by mouth daily.   Zinc 50 MG TABS Take 1 tablet by mouth daily.   traMADol (ULTRAM) 50 MG tablet Take 1 tablet (50 mg total) by mouth every 6 (six) hours.   [DISCONTINUED] cholecalciferol (VITAMIN D3) 25 MCG (1000 UNIT) tablet Take 1 tablet by mouth daily.   [DISCONTINUED] nitroGLYCERIN (NITROSTAT) 0.4 MG SL tablet Place 1 tablet (0.4 mg total) under the tongue every 5 (five) minutes as needed for chest pain.   No facility-administered encounter medications on file as of 04/29/2021.     SIGNIFICANT DIAGNOSTIC EXAMS  TODAY  04-25-21: left humerus x-ray: no left humerus fracture   04-25-21: left femur x-ray:  New periprosthetic curvilinear bone lucency in the lateral aspect of the left greater trochanter and proximal metaphysis suspicious for nondisplaced periprosthetic proximal left femoral fracture.  No hip dislocation Left total hip arthroplasty   04-26-21: chest x-ray: interstitial lung disease with some superimposed pachy density bilaterally that could reflect edema or pneumonia   LABS REVIEWED:   04-25-20: wbc 11.4; hgb 11.6; hct 36.0 plt 228; glucose 100; bun 17; creat 0.75 ;k+ 4.2; na++ 143; ca 9.5 albumin 3.0  04-28-21: hgb 9,3; hct 28.6;  Review of Systems  Unable to perform ROS: Dementia   Physical Exam Constitutional:      General: He is not in acute distress.    Appearance: He is well-developed. He is not diaphoretic.  Neck:     Thyroid: No thyromegaly.  Cardiovascular:     Rate and Rhythm: Normal rate and regular rhythm.     Pulses: Normal pulses.     Heart sounds: Murmur heard.  Pulmonary:     Effort: Pulmonary effort is normal. No respiratory distress.     Comments: Rales at bases 02  Abdominal:     General: Bowel sounds are normal. There is no distension.     Palpations: Abdomen is soft.     Tenderness: There is no abdominal  tenderness.  Musculoskeletal:     Cervical back: Neck supple.     Right lower leg: Edema present.     Left lower leg: Edema present.     Comments: Brace wraps around to left hip; and abduction pillow  Trace amount lower extremity edema   Lymphadenopathy:     Cervical: No cervical adenopathy.  Skin:    General: Skin is warm and dry.  Neurological:     Mental Status: He is alert. Mental status is at baseline.  Psychiatric:        Mood and Affect: Mood normal.     ASSESSMENT/ PLAN:  TODAY  Closed left hip fracture with delayed healing subsequent encounter: is stable will continue ultram 50 mg every 6 hours as needed through 05-02-21; tylenol 500 mg twice daily   2. Community acquired bilateral lower lob pneumonia: is stable will complete: omnicef 300 mg twice daily through 05-05-21; doxycycline 100 mg twice daily through 05-05-21  3. Pulmonary fibrosis associated with RA/ chronic respiratory failure with hypoxia: is stable is on 02  4. Paroxsymal atrial fibrillation: heart rate is stable will continue asa 81 mg daily lopressor 12.5 mg twice daily for rate control.   5. Essential hypertension: is stable will continue lopressor 12.5 mg twice daily   6. Coronary artery disease of native coronary artery of native heart without angina: is stable will continue asa 81 mg daily lopressor 12.5 mg twice daily   7. Mixed hyperlipidemia: is stable will continue lipitor 40 mg daily fish oil 1  gm daily   8. Rheumatoid arteritis: is stable will continue plaquenil 200 mg daily and azulfidine 1 gm twice daily prednisone 5 mg daily cymbalta 30 mg daily for pain management   9. Chronic diastolic congestive heart failure: is stable is euvolemic will monitor  10. Vascular dementia without behavioral disturbance  11. GERD without esophagitis: is stable will continue pepcid 40 mg daily  12. Anemia of chronic disease: is stable hgb 11.6 will continue iron daily   13.vitamin D deficiency: will continue  vitamin d 50,000 units weekly   14. Benign prostatic hyperplasia without urinary symptoms: will continue flomax 0.4 mg daily   15. Primary insomnia: will continue melatonin 5 mg daily     Will check cbc; cmp; tsh free t3 and free t4.     Ok Edwards NP [facility]  call [phone]

## 2021-04-30 ENCOUNTER — Encounter: Payer: Self-pay | Admitting: Internal Medicine

## 2021-04-30 ENCOUNTER — Non-Acute Institutional Stay (SKILLED_NURSING_FACILITY): Payer: Medicare Other | Admitting: Internal Medicine

## 2021-04-30 DIAGNOSIS — S7290XS Unspecified fracture of unspecified femur, sequela: Secondary | ICD-10-CM | POA: Insufficient documentation

## 2021-04-30 DIAGNOSIS — J8410 Pulmonary fibrosis, unspecified: Secondary | ICD-10-CM

## 2021-04-30 DIAGNOSIS — F0150 Vascular dementia without behavioral disturbance: Secondary | ICD-10-CM | POA: Diagnosis not present

## 2021-04-30 DIAGNOSIS — E440 Moderate protein-calorie malnutrition: Secondary | ICD-10-CM | POA: Diagnosis not present

## 2021-04-30 DIAGNOSIS — E46 Unspecified protein-calorie malnutrition: Secondary | ICD-10-CM | POA: Insufficient documentation

## 2021-04-30 NOTE — Progress Notes (Signed)
NURSING HOME LOCATION:  [facility] ROOM NUMBER:  [id number]  CODE STATUS:  DNR  PCP:  Lajean Manes MD  This is a comprehensive admission note to this SNFperformed on this date less than 30 days from date of admission. Included are preadmission medical/surgical history; reconciled medication list; family history; social history and comprehensive review of systems.  Corrections and additions to the records were documented. Comprehensive physical exam was also performed. Additionally a clinical summary was entered for each active diagnosis pertinent to this admission in the Problem List to enhance continuity of care.  HPI: The patient was hospitalized 1/30 - 04/28/2021 at [facility] after a mechanical fall.  Orthopedic consultant documented multiple proximal femur nondisplaced and minimally displaced fractures including the greater trochanteric, lesser trochanter and proximal medial diaphysis.  Nonsurgical management was recommended with 50% weightbearing as tolerated & hip abduction brace for the right hip.  Repeat imaging of the left hip was performed 2/1 after weightbearing to assess stability.  No additional fractures were noted and the current fractures were stable.  Anticoagulation was not pursued because of fall risk. Admission H/H was 11.6/36; at discharge H/H had dropped to 9.3/28.6.  Albumin was 2.6 and total protein 6. He was felt to be stable enough to be discharged to [facility] for rehab.     Past medical and surgical history: Includes history of AAA, PAF, oxygen dependent COPD, CAD with history of MI, essential hypertension, rheumatoid arthritis, and interstitial lung disease related to the rheumatoid arthritis. Surgeries and procedures include abdominal aortic endovascular stent grafting, coronary angioplasty, EGD, esophageal wrapping, post MI stenting, and multiple orthopedic surgeries.  Social history: Nondrinker; history of 150 pack years of smoking with  consumption of up to 3 packs/day.  Family history: Extensive and detailed history reviewed; it is noncontributory due to his advanced age.   Review of systems: Clinical neurocognitive deficits made validity of responses questionable ,preventing ROS completion.  The patient is unable to provide any meaningful history.  When I asked why he had been in the hospital his response was "do not know".  When asked if he were having any active problems his response was "do not think doing too good, cannot walk and leg hurts".  When asked where the pain was located he pointed to his right knee and then the right upper thigh saying "back".  When I asked the date his response was "fifth cannot think".  He gave the year as [birth year] and could not give me the name of the [place]. His wife was present and stated that he has had progressive memory loss over the last 3-4 years but exacerbated by COVID last June.  He has had 3 COVID vaccines. She stated that she did not observe the fall but believes he got tangled up in his oxygen tubing.  He is on maintenance oxygen because of pulmonary fibrosis. Although there is no definite cardiac or neurologic prodrome prior to the fall; his wife states that he does have intermittent double vision and also has hallucinations.  She says that he has some incontinence of stool and urine but no seizure activity. She goes on to say that he is "strong-willed, stubborn, and conniving" and will try to ambulate without help.  She states that he gets "antsy in the afternoon" which she feels is sundowning.  She states that sometimes this behavior lasts all day.  Physical exam:  Pertinent or positive findings: Speech is garbled.  He is wearing nasal oxygen.  Brows are prominent.  Eyebrows are decreased laterally.  He is edentulous.  Chest is barrel-shaped.  He has dry rales diffusely.  A grade 1 harsh systolic murmur is present at the right base.  Abdomen is scaphoid.  There is dullness in the left upper  quadrant without definite splenomegaly.  Feet are in a protective brace.  Pedal pulses are decreased especially posterior tibial pulses.  The right lower extremity is slightly warmer than the left without ischemic changes.  He has classic RA changes of the hands with lateral deviation and isolated swan-neck deformities.  Interosseous wasting is present.  General appearance: no acute distress, increased work of breathing is present.   Lymphatic: No lymphadenopathy about the head, neck, axilla. Eyes: No conjunctival inflammation or lid edema is present. There is no scleral icterus. Ears:  External ear exam shows no significant lesions or deformities.   Nose:  External nasal examination shows no deformity or inflammation. Nasal mucosa are pink and moist without lesions, exudates Oral exam: Lips and gums are healthy appearing.There is no oropharyngeal erythema or exudate. Neck:  No thyromegaly, masses, tenderness noted.    Heart:  Normal rate and regular rhythm. S1 and S2 normal without gallop, click, rub.  Lungs: without wheezes, rhonchi, rubs. Abdomen: Bowel sounds are normal.  Abdomen is soft and nontender with no organomegaly, hernias, masses. GU: Deferred  Extremities:  No cyanosis, clubbing, edema. Neurologic exam:  Balance, Rhomberg, finger to nose testing could not be completed due to clinical state Skin: Warm & dry w/o tenting. No significant lesions or rash.  See clinical summary under each active problem in the Problem List with associated updated therapeutic plan

## 2021-04-30 NOTE — Assessment & Plan Note (Signed)
Classic findings of interstitial lung disease on exam.  Adequate oxygenation on 2 L of nasal oxygen.

## 2021-04-30 NOTE — Assessment & Plan Note (Addendum)
04/27/2021 albumin 2.6 and total protein 6.  Limb atrophy present. Nutritionist will assess @ SNF

## 2021-04-30 NOTE — Assessment & Plan Note (Signed)
PT/OT at the SNF as tolerated.  Compliance will be hindered by his advanced dementia.

## 2021-04-30 NOTE — Assessment & Plan Note (Addendum)
He can provide no meaningful history.  He was unable to name the [place] and gave the year as 13. Wife states progressive memory issues since 2019 , worse since COVID infection in June 2022.  She states he tries to ambulate without help.  She describes frequent episodes of sundowning.

## 2021-04-30 NOTE — Patient Instructions (Signed)
See assessment and plan under each diagnosis in the problem list and acutely for this visit 

## 2021-05-01 ENCOUNTER — Inpatient Hospital Stay (HOSPITAL_COMMUNITY)
Admission: EM | Admit: 2021-05-01 | Discharge: 2021-05-04 | DRG: 871 | Disposition: A | Discharge status: Skilled Nursing Facility | Payer: Medicare Other | Attending: Family Medicine | Admitting: Family Medicine

## 2021-05-01 ENCOUNTER — Emergency Department (HOSPITAL_COMMUNITY): Payer: Medicare Other

## 2021-05-01 ENCOUNTER — Other Ambulatory Visit: Payer: Self-pay

## 2021-05-01 ENCOUNTER — Telehealth: Payer: Self-pay | Admitting: Family

## 2021-05-01 ENCOUNTER — Encounter (HOSPITAL_COMMUNITY): Payer: Self-pay

## 2021-05-01 DIAGNOSIS — D899 Disorder involving the immune mechanism, unspecified: Secondary | ICD-10-CM | POA: Diagnosis present

## 2021-05-01 DIAGNOSIS — N390 Urinary tract infection, site not specified: Secondary | ICD-10-CM | POA: Diagnosis present

## 2021-05-01 DIAGNOSIS — R52 Pain, unspecified: Secondary | ICD-10-CM

## 2021-05-01 DIAGNOSIS — I272 Pulmonary hypertension, unspecified: Secondary | ICD-10-CM | POA: Diagnosis present

## 2021-05-01 DIAGNOSIS — Z8249 Family history of ischemic heart disease and other diseases of the circulatory system: Secondary | ICD-10-CM

## 2021-05-01 DIAGNOSIS — G309 Alzheimer's disease, unspecified: Secondary | ICD-10-CM | POA: Diagnosis present

## 2021-05-01 DIAGNOSIS — Z825 Family history of asthma and other chronic lower respiratory diseases: Secondary | ICD-10-CM

## 2021-05-01 DIAGNOSIS — N400 Enlarged prostate without lower urinary tract symptoms: Secondary | ICD-10-CM | POA: Diagnosis present

## 2021-05-01 DIAGNOSIS — S72002G Fracture of unspecified part of neck of left femur, subsequent encounter for closed fracture with delayed healing: Secondary | ICD-10-CM | POA: Diagnosis not present

## 2021-05-01 DIAGNOSIS — Z20822 Contact with and (suspected) exposure to covid-19: Secondary | ICD-10-CM | POA: Diagnosis present

## 2021-05-01 DIAGNOSIS — T84029A Dislocation of unspecified internal joint prosthesis, initial encounter: Secondary | ICD-10-CM

## 2021-05-01 DIAGNOSIS — M0510 Rheumatoid lung disease with rheumatoid arthritis of unspecified site: Secondary | ICD-10-CM | POA: Diagnosis present

## 2021-05-01 DIAGNOSIS — F03918 Unspecified dementia, unspecified severity, with other behavioral disturbance: Secondary | ICD-10-CM | POA: Diagnosis present

## 2021-05-01 DIAGNOSIS — R509 Fever, unspecified: Secondary | ICD-10-CM

## 2021-05-01 DIAGNOSIS — J9601 Acute respiratory failure with hypoxia: Secondary | ICD-10-CM | POA: Diagnosis not present

## 2021-05-01 DIAGNOSIS — I252 Old myocardial infarction: Secondary | ICD-10-CM

## 2021-05-01 DIAGNOSIS — I5032 Chronic diastolic (congestive) heart failure: Secondary | ICD-10-CM | POA: Diagnosis present

## 2021-05-01 DIAGNOSIS — I480 Paroxysmal atrial fibrillation: Secondary | ICD-10-CM | POA: Diagnosis present

## 2021-05-01 DIAGNOSIS — Z9981 Dependence on supplemental oxygen: Secondary | ICD-10-CM

## 2021-05-01 DIAGNOSIS — W1830XA Fall on same level, unspecified, initial encounter: Secondary | ICD-10-CM | POA: Diagnosis present

## 2021-05-01 DIAGNOSIS — R0602 Shortness of breath: Secondary | ICD-10-CM | POA: Diagnosis not present

## 2021-05-01 DIAGNOSIS — Z96649 Presence of unspecified artificial hip joint: Secondary | ICD-10-CM

## 2021-05-01 DIAGNOSIS — I110 Hypertensive heart disease with heart failure: Secondary | ICD-10-CM | POA: Diagnosis present

## 2021-05-01 DIAGNOSIS — F0150 Vascular dementia without behavioral disturbance: Secondary | ICD-10-CM | POA: Diagnosis present

## 2021-05-01 DIAGNOSIS — E785 Hyperlipidemia, unspecified: Secondary | ICD-10-CM | POA: Diagnosis present

## 2021-05-01 DIAGNOSIS — I10 Essential (primary) hypertension: Secondary | ICD-10-CM | POA: Diagnosis not present

## 2021-05-01 DIAGNOSIS — F0280 Dementia in other diseases classified elsewhere without behavioral disturbance: Secondary | ICD-10-CM | POA: Diagnosis present

## 2021-05-01 DIAGNOSIS — M0520 Rheumatoid vasculitis with rheumatoid arthritis of unspecified site: Secondary | ICD-10-CM | POA: Diagnosis present

## 2021-05-01 DIAGNOSIS — Z66 Do not resuscitate: Secondary | ICD-10-CM | POA: Diagnosis present

## 2021-05-01 DIAGNOSIS — Z7952 Long term (current) use of systemic steroids: Secondary | ICD-10-CM

## 2021-05-01 DIAGNOSIS — A419 Sepsis, unspecified organism: Principal | ICD-10-CM | POA: Diagnosis present

## 2021-05-01 DIAGNOSIS — I2510 Atherosclerotic heart disease of native coronary artery without angina pectoris: Secondary | ICD-10-CM | POA: Diagnosis present

## 2021-05-01 DIAGNOSIS — R918 Other nonspecific abnormal finding of lung field: Secondary | ICD-10-CM | POA: Diagnosis not present

## 2021-05-01 DIAGNOSIS — Z9861 Coronary angioplasty status: Secondary | ICD-10-CM

## 2021-05-01 DIAGNOSIS — J9611 Chronic respiratory failure with hypoxia: Secondary | ICD-10-CM | POA: Diagnosis present

## 2021-05-01 DIAGNOSIS — R6510 Systemic inflammatory response syndrome (SIRS) of non-infectious origin without acute organ dysfunction: Secondary | ICD-10-CM | POA: Diagnosis present

## 2021-05-01 DIAGNOSIS — Z885 Allergy status to narcotic agent status: Secondary | ICD-10-CM

## 2021-05-01 DIAGNOSIS — I714 Abdominal aortic aneurysm, without rupture, unspecified: Secondary | ICD-10-CM | POA: Diagnosis not present

## 2021-05-01 DIAGNOSIS — D500 Iron deficiency anemia secondary to blood loss (chronic): Secondary | ICD-10-CM | POA: Diagnosis present

## 2021-05-01 DIAGNOSIS — Z7189 Other specified counseling: Secondary | ICD-10-CM | POA: Diagnosis not present

## 2021-05-01 DIAGNOSIS — I517 Cardiomegaly: Secondary | ICD-10-CM | POA: Diagnosis not present

## 2021-05-01 DIAGNOSIS — R6520 Severe sepsis without septic shock: Secondary | ICD-10-CM | POA: Diagnosis not present

## 2021-05-01 DIAGNOSIS — Z515 Encounter for palliative care: Secondary | ICD-10-CM

## 2021-05-01 DIAGNOSIS — J439 Emphysema, unspecified: Secondary | ICD-10-CM | POA: Diagnosis present

## 2021-05-01 DIAGNOSIS — Z96653 Presence of artificial knee joint, bilateral: Secondary | ICD-10-CM | POA: Diagnosis present

## 2021-05-01 DIAGNOSIS — S72002A Fracture of unspecified part of neck of left femur, initial encounter for closed fracture: Secondary | ICD-10-CM | POA: Diagnosis not present

## 2021-05-01 DIAGNOSIS — R451 Restlessness and agitation: Secondary | ICD-10-CM | POA: Diagnosis not present

## 2021-05-01 DIAGNOSIS — Z79899 Other long term (current) drug therapy: Secondary | ICD-10-CM

## 2021-05-01 DIAGNOSIS — J8410 Pulmonary fibrosis, unspecified: Secondary | ICD-10-CM | POA: Diagnosis present

## 2021-05-01 DIAGNOSIS — Z87891 Personal history of nicotine dependence: Secondary | ICD-10-CM

## 2021-05-01 DIAGNOSIS — D72829 Elevated white blood cell count, unspecified: Secondary | ICD-10-CM

## 2021-05-01 DIAGNOSIS — Z888 Allergy status to other drugs, medicaments and biological substances status: Secondary | ICD-10-CM

## 2021-05-01 DIAGNOSIS — Z7982 Long term (current) use of aspirin: Secondary | ICD-10-CM

## 2021-05-01 DIAGNOSIS — J690 Pneumonitis due to inhalation of food and vomit: Secondary | ICD-10-CM | POA: Diagnosis present

## 2021-05-01 DIAGNOSIS — N401 Enlarged prostate with lower urinary tract symptoms: Secondary | ICD-10-CM | POA: Diagnosis not present

## 2021-05-01 DIAGNOSIS — R3912 Poor urinary stream: Secondary | ICD-10-CM | POA: Diagnosis not present

## 2021-05-01 LAB — URINALYSIS, ROUTINE W REFLEX MICROSCOPIC
Glucose, UA: NEGATIVE mg/dL
Ketones, ur: NEGATIVE mg/dL
Leukocytes,Ua: NEGATIVE
Nitrite: NEGATIVE
Specific Gravity, Urine: 1.025 (ref 1.005–1.030)
pH: 5 (ref 5.0–8.0)

## 2021-05-01 LAB — COMPREHENSIVE METABOLIC PANEL
ALT: 28 U/L (ref 0–44)
AST: 40 U/L (ref 15–41)
Albumin: 2.9 g/dL — ABNORMAL LOW (ref 3.5–5.0)
Alkaline Phosphatase: 82 U/L (ref 38–126)
Anion gap: 12 (ref 5–15)
BUN: 36 mg/dL — ABNORMAL HIGH (ref 8–23)
CO2: 23 mmol/L (ref 22–32)
Calcium: 9.2 mg/dL (ref 8.9–10.3)
Chloride: 103 mmol/L (ref 98–111)
Creatinine, Ser: 0.93 mg/dL (ref 0.61–1.24)
GFR, Estimated: >60 mL/min (ref >60)
Glucose, Bld: 116 mg/dL — ABNORMAL HIGH (ref 70–99)
Potassium: 4.2 mmol/L (ref 3.5–5.1)
Sodium: 138 mmol/L (ref 135–145)
Total Bilirubin: 1.2 mg/dL (ref 0.3–1.2)
Total Protein: 5.6 g/dL — ABNORMAL LOW (ref 6.5–8.1)

## 2021-05-01 LAB — CBC WITH DIFFERENTIAL/PLATELET
Abs Immature Granulocytes: 0.18 10*3/uL — ABNORMAL HIGH (ref 0.00–0.07)
Basophils Absolute: 0.1 10*3/uL (ref 0.0–0.1)
Basophils Relative: 0 %
Eosinophils Absolute: 0 10*3/uL (ref 0.0–0.5)
Eosinophils Relative: 0 %
HCT: 30.2 % — ABNORMAL LOW (ref 39.0–52.0)
Hemoglobin: 9.9 g/dL — ABNORMAL LOW (ref 13.0–17.0)
Immature Granulocytes: 1 %
Lymphocytes Relative: 3 %
Lymphs Abs: 0.8 10*3/uL (ref 0.7–4.0)
MCH: 32.8 pg (ref 26.0–34.0)
MCHC: 32.8 g/dL (ref 30.0–36.0)
MCV: 100 fL (ref 80.0–100.0)
Monocytes Absolute: 1.6 10*3/uL — ABNORMAL HIGH (ref 0.1–1.0)
Monocytes Relative: 6 %
Neutro Abs: 23 10*3/uL — ABNORMAL HIGH (ref 1.7–7.7)
Neutrophils Relative %: 90 %
Platelets: 299 10*3/uL (ref 150–400)
RBC: 3.02 MIL/uL — ABNORMAL LOW (ref 4.22–5.81)
RDW: 14.4 % (ref 11.5–15.5)
WBC: 25.7 10*3/uL — ABNORMAL HIGH (ref 4.0–10.5)
nRBC: 0.1 % (ref 0.0–0.2)

## 2021-05-01 LAB — SEDIMENTATION RATE: Sed Rate: 70 mm/h — ABNORMAL HIGH (ref 0–16)

## 2021-05-01 LAB — RESP PANEL BY RT-PCR (FLU A&B, COVID) ARPGX2
Influenza A by PCR: NEGATIVE
Influenza B by PCR: NEGATIVE
SARS Coronavirus 2 by RT PCR: NEGATIVE

## 2021-05-01 LAB — LACTIC ACID, PLASMA: Lactic Acid, Venous: 1.9 mmol/L (ref 0.5–1.9)

## 2021-05-01 LAB — URINALYSIS, MICROSCOPIC (REFLEX)

## 2021-05-01 LAB — C-REACTIVE PROTEIN: CRP: 31.5 mg/dL — ABNORMAL HIGH

## 2021-05-01 LAB — PROTIME-INR
INR: 1.3 — ABNORMAL HIGH (ref 0.8–1.2)
Prothrombin Time: 16.2 seconds — ABNORMAL HIGH (ref 11.4–15.2)

## 2021-05-01 LAB — BRAIN NATRIURETIC PEPTIDE: B Natriuretic Peptide: 153 pg/mL — ABNORMAL HIGH (ref 0.0–100.0)

## 2021-05-01 LAB — PROCALCITONIN: Procalcitonin: 0.58 ng/mL

## 2021-05-01 LAB — APTT: aPTT: 32 seconds (ref 24–36)

## 2021-05-01 MED ORDER — SODIUM CHLORIDE 0.9 % IV SOLN
2.0000 g | Freq: Two times a day (BID) | INTRAVENOUS | Status: DC
  Administered 2021-05-01: 2 g via INTRAVENOUS
  Filled 2021-05-01: qty 2

## 2021-05-01 MED ORDER — CHLORHEXIDINE GLUCONATE CLOTH 2 % EX PADS
6.0000 | MEDICATED_PAD | Freq: Every day | CUTANEOUS | Status: DC
  Administered 2021-05-01 – 2021-05-03 (×3): 6 via TOPICAL

## 2021-05-01 MED ORDER — LACTATED RINGERS IV BOLUS (SEPSIS)
500.0000 mL | Freq: Once | INTRAVENOUS | Status: AC
  Administered 2021-05-01: 500 mL via INTRAVENOUS

## 2021-05-01 MED ORDER — HEPARIN SODIUM (PORCINE) 5000 UNIT/ML IJ SOLN
5000.0000 [IU] | Freq: Three times a day (TID) | INTRAMUSCULAR | Status: DC
  Administered 2021-05-01 – 2021-05-04 (×9): 5000 [IU] via SUBCUTANEOUS
  Filled 2021-05-01 (×9): qty 1

## 2021-05-01 MED ORDER — POLYVINYL ALCOHOL 1.4 % OP SOLN: 1.0000 [drp] | Freq: Three times a day (TID) | OPHTHALMIC | Status: DC | PRN

## 2021-05-01 MED ORDER — TRAZODONE HCL 50 MG PO TABS
50.0000 mg | ORAL_TABLET | Freq: Every evening | ORAL | Status: DC | PRN
  Administered 2021-05-02: 50 mg via ORAL
  Filled 2021-05-01: qty 1

## 2021-05-01 MED ORDER — VITAMIN D (ERGOCALCIFEROL) 1.25 MG (50000 UNIT) PO CAPS: 50000.0000 [IU] | ORAL_CAPSULE | ORAL | Status: DC

## 2021-05-01 MED ORDER — ACETAMINOPHEN 650 MG RE SUPP: 650.0000 mg | Freq: Four times a day (QID) | RECTAL | Status: DC | PRN

## 2021-05-01 MED ORDER — LACTATED RINGERS IV BOLUS (SEPSIS)
1000.0000 mL | Freq: Once | INTRAVENOUS | Status: AC
  Administered 2021-05-01: 1000 mL via INTRAVENOUS

## 2021-05-01 MED ORDER — METOPROLOL TARTRATE 25 MG PO TABS
12.5000 mg | ORAL_TABLET | Freq: Two times a day (BID) | ORAL | Status: DC
  Administered 2021-05-01 – 2021-05-04 (×6): 12.5 mg via ORAL
  Filled 2021-05-01 (×6): qty 1

## 2021-05-01 MED ORDER — ONDANSETRON HCL 4 MG PO TABS: 4.0000 mg | ORAL_TABLET | Freq: Four times a day (QID) | ORAL | Status: DC | PRN

## 2021-05-01 MED ORDER — SENNOSIDES-DOCUSATE SODIUM 8.6-50 MG PO TABS: 1.0000 | ORAL_TABLET | Freq: Every evening | ORAL | Status: DC | PRN

## 2021-05-01 MED ORDER — HYDRALAZINE HCL 20 MG/ML IJ SOLN: 10.0000 mg | INTRAMUSCULAR | Status: DC | PRN

## 2021-05-01 MED ORDER — ACETAMINOPHEN 325 MG PO TABS: 650.0000 mg | ORAL_TABLET | Freq: Four times a day (QID) | ORAL | Status: DC | PRN

## 2021-05-01 MED ORDER — ASPIRIN 81 MG PO CHEW
81.0000 mg | CHEWABLE_TABLET | Freq: Every day | ORAL | Status: DC
  Administered 2021-05-02 – 2021-05-04 (×3): 81 mg via ORAL
  Filled 2021-05-01 (×3): qty 1

## 2021-05-01 MED ORDER — SODIUM CHLORIDE 0.9% FLUSH
3.0000 mL | Freq: Two times a day (BID) | INTRAVENOUS | Status: DC
  Administered 2021-05-03 – 2021-05-04 (×2): 3 mL via INTRAVENOUS

## 2021-05-01 MED ORDER — ONDANSETRON HCL 4 MG/2ML IJ SOLN: 4.0000 mg | Freq: Four times a day (QID) | INTRAMUSCULAR | Status: DC | PRN

## 2021-05-01 MED ORDER — TAMSULOSIN HCL 0.4 MG PO CAPS
0.4000 mg | ORAL_CAPSULE | Freq: Every day | ORAL | Status: DC
  Administered 2021-05-02 – 2021-05-04 (×3): 0.4 mg via ORAL
  Filled 2021-05-01 (×3): qty 1

## 2021-05-01 MED ORDER — ATORVASTATIN CALCIUM 40 MG PO TABS
40.0000 mg | ORAL_TABLET | Freq: Every day | ORAL | Status: DC
  Administered 2021-05-02 – 2021-05-03 (×2): 40 mg via ORAL
  Filled 2021-05-01 (×2): qty 1

## 2021-05-01 MED ORDER — BISACODYL 5 MG PO TBEC: 5.0000 mg | DELAYED_RELEASE_TABLET | Freq: Every day | ORAL | Status: DC | PRN

## 2021-05-01 MED ORDER — SODIUM CHLORIDE 0.9 % IV SOLN
2.0000 g | Freq: Once | INTRAVENOUS | Status: AC
  Administered 2021-05-01: 2 g via INTRAVENOUS
  Filled 2021-05-01: qty 2

## 2021-05-01 MED ORDER — HALOPERIDOL LACTATE 5 MG/ML IJ SOLN
2.0000 mg | Freq: Four times a day (QID) | INTRAMUSCULAR | Status: DC | PRN
  Administered 2021-05-01 – 2021-05-04 (×5): 2 mg via INTRAVENOUS
  Filled 2021-05-01 (×5): qty 1

## 2021-05-01 MED ORDER — LACTATED RINGERS IV SOLN: INTRAVENOUS | Status: AC

## 2021-05-01 MED ORDER — FENTANYL CITRATE PF 50 MCG/ML IJ SOSY
50.0000 ug | PREFILLED_SYRINGE | INTRAMUSCULAR | Status: DC | PRN
  Administered 2021-05-01: 50 ug via INTRAVENOUS
  Filled 2021-05-01: qty 1

## 2021-05-01 MED ORDER — DULOXETINE HCL 30 MG PO CPEP
30.0000 mg | ORAL_CAPSULE | Freq: Every day | ORAL | Status: DC
Start: 2021-05-02 — End: 2021-05-05
  Administered 2021-05-02 – 2021-05-04 (×3): 30 mg via ORAL
  Filled 2021-05-01 (×3): qty 1

## 2021-05-01 MED ORDER — LEVALBUTEROL HCL 0.63 MG/3ML IN NEBU: 0.6300 mg | INHALATION_SOLUTION | Freq: Four times a day (QID) | RESPIRATORY_TRACT | Status: DC | PRN

## 2021-05-01 MED ORDER — ASCORBIC ACID 500 MG PO TABS
500.0000 mg | ORAL_TABLET | Freq: Every day | ORAL | Status: DC
  Administered 2021-05-02 – 2021-05-03 (×2): 500 mg via ORAL
  Filled 2021-05-01 (×2): qty 1

## 2021-05-01 MED ORDER — VANCOMYCIN HCL IN DEXTROSE 1-5 GM/200ML-% IV SOLN: 1000.0000 mg | Freq: Once | INTRAVENOUS | Status: DC

## 2021-05-01 MED ORDER — ADULT MULTIVITAMIN W/MINERALS CH
1.0000 | ORAL_TABLET | Freq: Every day | ORAL | Status: DC
  Administered 2021-05-02 – 2021-05-03 (×2): 1 via ORAL
  Filled 2021-05-01 (×2): qty 1

## 2021-05-01 MED ORDER — METHYLPREDNISOLONE SODIUM SUCC 40 MG IJ SOLR
40.0000 mg | INTRAMUSCULAR | Status: DC
  Administered 2021-05-01: 40 mg via INTRAVENOUS
  Filled 2021-05-01: qty 1

## 2021-05-01 MED ORDER — FERROUS SULFATE 325 (65 FE) MG PO TABS
325.0000 mg | ORAL_TABLET | Freq: Every day | ORAL | Status: DC
  Administered 2021-05-02 – 2021-05-04 (×3): 325 mg via ORAL
  Filled 2021-05-01 (×3): qty 1

## 2021-05-01 MED ORDER — CARBOXYMETHYLCELLULOSE SODIUM 0.5 % OP SOLN: 1.0000 [drp] | Freq: Three times a day (TID) | OPHTHALMIC | Status: DC | PRN

## 2021-05-01 MED ORDER — METRONIDAZOLE 500 MG/100ML IV SOLN
500.0000 mg | Freq: Once | INTRAVENOUS | Status: AC
  Administered 2021-05-01: 500 mg via INTRAVENOUS
  Filled 2021-05-01: qty 100

## 2021-05-01 MED ORDER — VANCOMYCIN HCL 1500 MG/300ML IV SOLN
1500.0000 mg | Freq: Once | INTRAVENOUS | Status: AC
Start: 2021-05-01 — End: 2021-05-01
  Administered 2021-05-01: 1500 mg via INTRAVENOUS
  Filled 2021-05-01: qty 300

## 2021-05-01 MED ORDER — DOXYCYCLINE HYCLATE 100 MG PO TABS
100.0000 mg | ORAL_TABLET | Freq: Two times a day (BID) | ORAL | Status: DC
  Administered 2021-05-01 – 2021-05-02 (×2): 100 mg via ORAL
  Filled 2021-05-01 (×2): qty 1

## 2021-05-01 MED ORDER — CALCIUM CARBONATE 1250 (500 CA) MG PO TABS
1250.0000 mg | ORAL_TABLET | Freq: Every day | ORAL | Status: DC
  Administered 2021-05-02 – 2021-05-04 (×3): 1250 mg via ORAL
  Filled 2021-05-01 (×4): qty 1

## 2021-05-01 MED ORDER — FLORANEX PO PACK
1.0000 g | PACK | Freq: Three times a day (TID) | ORAL | Status: DC
  Filled 2021-05-01 (×8): qty 1

## 2021-05-01 MED ORDER — SODIUM CHLORIDE 0.9% FLUSH: 3.0000 mL | INTRAVENOUS | Status: DC | PRN

## 2021-05-01 MED ORDER — IPRATROPIUM BROMIDE 0.02 % IN SOLN: 0.5000 mg | Freq: Four times a day (QID) | RESPIRATORY_TRACT | Status: DC | PRN

## 2021-05-01 MED ORDER — FENTANYL CITRATE PF 50 MCG/ML IJ SOSY
50.0000 ug | PREFILLED_SYRINGE | Freq: Once | INTRAMUSCULAR | Status: AC
  Administered 2021-05-01: 50 ug via INTRAVENOUS
  Filled 2021-05-01: qty 1

## 2021-05-01 MED ORDER — TRAZODONE HCL 50 MG PO TABS: 25.0000 mg | ORAL_TABLET | Freq: Every evening | ORAL | Status: DC | PRN

## 2021-05-01 MED ORDER — VANCOMYCIN HCL 1250 MG/250ML IV SOLN: 1250.0000 mg | INTRAVENOUS | Status: DC

## 2021-05-01 MED ORDER — SODIUM CHLORIDE 0.9 % IV SOLN: 250.0000 mL | INTRAVENOUS | Status: DC | PRN

## 2021-05-01 MED ORDER — SODIUM CHLORIDE 0.9% FLUSH
3.0000 mL | Freq: Two times a day (BID) | INTRAVENOUS | Status: DC
  Administered 2021-05-02 – 2021-05-04 (×5): 3 mL via INTRAVENOUS

## 2021-05-01 MED ORDER — SODIUM CHLORIDE 0.9 % IV SOLN: INTRAVENOUS | Status: DC

## 2021-05-01 MED ORDER — HYDROMORPHONE HCL 1 MG/ML IJ SOLN
0.5000 mg | INTRAMUSCULAR | Status: DC | PRN
  Administered 2021-05-01: 1 mg via INTRAVENOUS
  Administered 2021-05-01: 0.5 mg via INTRAVENOUS
  Administered 2021-05-02 – 2021-05-03 (×3): 1 mg via INTRAVENOUS
  Filled 2021-05-01 (×5): qty 1

## 2021-05-01 MED ORDER — SULFASALAZINE 500 MG PO TABS
1000.0000 mg | ORAL_TABLET | Freq: Three times a day (TID) | ORAL | Status: DC
  Administered 2021-05-01 – 2021-05-04 (×7): 1000 mg via ORAL
  Filled 2021-05-01 (×14): qty 2

## 2021-05-01 MED ORDER — IOHEXOL 350 MG/ML SOLN
75.0000 mL | Freq: Once | INTRAVENOUS | Status: AC | PRN
  Administered 2021-05-01: 75 mL via INTRAVENOUS

## 2021-05-01 MED ORDER — LORAZEPAM 2 MG/ML IJ SOLN
0.5000 mg | Freq: Three times a day (TID) | INTRAMUSCULAR | Status: DC | PRN
  Administered 2021-05-03: 0.5 mg via INTRAVENOUS
  Filled 2021-05-01: qty 1

## 2021-05-01 NOTE — Progress Notes (Signed)
Pharmacy Antibiotic Note  JD MCCASTER is a 88 y.o. male admitted on 05/01/2021 with  unknown source .  Pharmacy has been consulted for Vancomycin and cefepime dosing.  Plan: Vancomycin 1500mg  IV loading dose then 1250 mg IV Q 124 hrs. Goal AUC 400-550. Expected AUC: 512 SCr used: 0.93 Cefepime 2gm IV q12h F/U cxs and clinical progress Monitor V/S, labs and levels as indicated   Height: 6' (182.9 cm) Weight: 73 kg (160 lb 15 oz) IBW/kg (Calculated) : 77.6  Temp (24hrs), Avg:99.3 F (37.4 C), Min:98.2 F (36.8 C), Max:100.3 F (37.9 C)  Recent Labs  Lab 05/01/21 1113  WBC 25.7*  CREATININE 0.93  LATICACIDVEN 1.9    Estimated Creatinine Clearance: 56.7 mL/min (by C-G formula based on SCr of 0.93 mg/dL).    Allergies  Allergen Reactions   Morphine Other (See Comments)    "makes me crazy"   Morphine And Related Nausea And Vomiting and Other (See Comments)    Hallucinations    Nirmatrelvir-Ritonavir Other (See Comments)    Other reaction(s): confusion     Antimicrobials this admission: Vancomycin 2/5 >>  Cefepime 2/5 >>  Flagyl 2/5  Microbiology results: 2/5 BCx: pending 2/5 UCx: pending   MRSA PCR:   Thank you for allowing pharmacy to be a part of this patients care.  Isac Sarna, BS [place], [place] Clinical Pharmacist Pager [phone] 05/01/2021 12:46 PM

## 2021-05-01 NOTE — Assessment & Plan Note (Addendum)
-  Sepsis - POA -suspect aspiration pneumonia related  -Leukocytosis resolved, overall sepsis pathophysiology resolved -Urine and blood cultures NGTD -- CT angiogram reviewed-chronic pulmonary fibrosis, ?? Pneumonia, also noted chronic diffuse esophageal changes possible strictures -Treated with Vancomycin, Cefepime and Flagyl -Discussed with ID Physician Dr Wendie Agreste, he recommends De-escalating antibiotics to Augmentin Monotherapy starting on 05/03/21 for 5 additional days - respiratory status appears stable -Patient with chronic hypoxic respiratory failure, appears to be at baseline at this time, C/n supplemental oxygen at 2 L/min continuously which is patient's baseline

## 2021-05-01 NOTE — Assessment & Plan Note (Addendum)
-   Severe pulmonary fibrosis//COPD -Patient with history of heavy tobacco use previously -PTA on home O2 at 2 L chronically -Also chronically on steroids more for rheumatoid arthritis than pulmonary issues -Continue PTA bronchodilators, steroids  C/n supplemental oxygen at 2 L/min continuously which is patient's baseline

## 2021-05-01 NOTE — Hospital Course (Signed)
[name] is a 88 year old male chronic respiratory failure COPD/pulmonary fibrosis 2 L dependent, CAD, MI, HTN, HLD, RA,AAA, dementia,   Presenting from [facility], SNF concern for fever.  Patient was recently hospitalized at [facility] from 1 32-2 after mechanical fall sustaining hip fracture. It was determined that his proximal femur fracture was not amendable to ORIF therefore was placed in a hip brace on the right side.  And was sent for rehab. Patient has not been anticoagulated due to dementia and falls.  He has chronic respiratory failure due to pulmonary fibrosis on 2 L of supplemental oxygen continuously. He has been empirically on cefdinir and doxycycline for empiric treatment of possible pneumonia at the facility.  ED: Upon arrival Temp: 100.7, RR 28 BP 96/54, WBC of 25.7 UA amber, trace of hemoglobin, protein, few bacteria WBC CTA: Negative for any PE chronic residual lung disease, pulmonary fibrosis, interstitial lung disease, pneumonia could not do review abnormal esophageal findings seems to be chronic with hiatal hernia, mediastinal lymphadenopathy mild, possible mild pulmonary hypertension, aortic and coronary atherosclerotic disease   -Cultures were obtained, patient was started on broad-spectrum antibiotics of cefepime and vancomycin

## 2021-05-01 NOTE — Assessment & Plan Note (Addendum)
-  Sepsis -unknown etiology likely UTI versus pneumonia  -On arrival met sepsis criteria: TM 100.3, PR 103,, RR 25, BP 95/57, -WBC 25.7  - CT angiogram reviewed-chronic pulmonary fibrosis pneumonia could not be ruled out-diffuse esophageal changes possible strictures-chronic  -Urine cloudy trace of hemoglobin few bacteria, WBC 6-10  -Blood and urine culture has been obtained in ED  -We will follow-up with blood/urine cultures  -Patient presenting from [facility] SNF -Patient has been empirically antibiotics at the SNF doxycycline, Omnicef,  -Gentle IV fluid hydration has been initiated due to chronic pulmonary fibrosis  -Broad-spectrum IV antibiotic initiated in ED cefepime and vancomycin

## 2021-05-01 NOTE — Assessment & Plan Note (Addendum)
-  Elevated [place] Vasc score -Risk versus benefits of full anticoagulation discussed with family, no full anticoagulation at this time --Continue aspirin -On metoprolol for rate control,

## 2021-05-01 NOTE — Telephone Encounter (Signed)
Late entry: [place] called on call provider states patient not doing well Temp 100.7 ,respiration 28 b/min and Blood pressure 96/54.currently on two antibiotics for recent pneumonia.Advised to send to ED for further evaluation.

## 2021-05-01 NOTE — Assessment & Plan Note (Addendum)
-   Stable as needed analgesics, Hold PTA hydroxychloroquine C/n Steroids ( pt chronically on steroids PTA)

## 2021-05-01 NOTE — Assessment & Plan Note (Addendum)
-   Apparently patient had a spontaneous left hip fracture, was not a candidate for ORIF due to chronic advanced lung disease, pulmonary fibrosis -Currently in a Rt Hip/Thigh brace--to be used when upright and/or ambulating -May use a wedge pillow when in bed -Continue physical therapy with 50% weightbearing on the left, hopefully patient will have spontaneous healing -Plan to transfer back to SNF for ongoing rehab, allowed to partially bear weight by orthopedic team

## 2021-05-01 NOTE — Assessment & Plan Note (Addendum)
-  Compensated, no evidence of CHF exacerbation at this time, Last Echo reviewed from 05/12/2020: EJf: 45 to 50%. The left ventricle has mildly decreased function. The left ventricle demonstrates global hypokinesis. There is mild concentric left ventricular hypertrophy. Left ventricular diastolic  parameters are consistent with Grade I diastolic dysfunction (impaired relaxation

## 2021-05-01 NOTE — Sepsis Progress Note (Signed)
Elink following for code sepsis 

## 2021-05-01 NOTE — ED Provider Notes (Signed)
[facility] EMERGENCY DEPARTMENT Provider Note   CSN: [id number] Arrival date & time: 05/01/21  1048     History  Chief Complaint  Patient presents with   Fever   Shortness of Breath    [name] is a 88 y.o. male.  HPI Patient presents by EMS from [facility] for concern of fever.  He was recently hospitalized at [facility] from 1/30 to 2/2 after mechanical fall.  He had a left-sided proximal femur fracture.  Nonsurgical management was recommended.  Patient was placed in a hip brace on the right side.  He has not been on anticoagulation due to fall risk.  He has been on 2 L of supplemental oxygen.  He is currently on cefdinir and doxycycline for what is described as pneumonia prophylaxis.  Home Medications Prior to Admission medications   Medication Sig Start Date End Date Taking? Authorizing Provider  acetaminophen (TYLENOL) 500 MG tablet Take 500 mg by mouth in the morning and at bedtime.    [provider]  Ascorbic Acid (VITAMIN C) 500 MG CAPS Take 1 tablet by mouth daily.    [provider]  aspirin 81 MG tablet Take 81 mg by mouth daily.    [provider]  atorvastatin (LIPITOR) 40 MG tablet Take 40 mg by mouth daily.    [provider]  calcium carbonate (OS-CAL) 600 MG TABS Take 600 mg by mouth daily.    [provider]  carboxymethylcellulose (REFRESH PLUS) 0.5 % SOLN 1 drop 3 (three) times daily as needed (dry eyes).    [provider]  cefdinir (OMNICEF) 300 MG capsule Take 300 mg by mouth 2 (two) times daily.    [provider]  doxycycline (VIBRAMYCIN) 100 MG capsule Take 100 mg by mouth 2 (two) times daily.    [provider]  DULoxetine (CYMBALTA) 30 MG capsule Take 30 mg by mouth daily.    [provider]  ergocalciferol (VITAMIN D2) 1.25 MG (50000 UT) capsule Take 50,000 Units by mouth once a week. Sunday    [provider]  famotidine (PEPCID) 40 MG tablet Take 40  mg by mouth daily. 10/11/18   [provider]  ferrous sulfate 325 (65 FE) MG tablet Take 325 mg by mouth daily with breakfast.    [provider]  hydroxychloroquine (PLAQUENIL) 200 MG tablet Take 200 mg by mouth daily.    [provider]  melatonin 5 MG TABS Take 5 mg by mouth at bedtime.    [provider]  metoprolol tartrate (LOPRESSOR) 25 MG tablet Take 0.5 tablets (12.5 mg total) by mouth 2 (two) times daily. 12/29/20   Belva Crome, MD  Multiple Vitamin (MULTIVITAMIN) capsule Take 1 capsule by mouth daily.    [provider]  Multiple Vitamins-Minerals (OCUVITE PRESERVISION PO) Take 1 tablet by mouth daily.    [provider]  Omega-3 Fatty Acids (FISH OIL) 1000 MG CAPS Take 1 capsule by mouth daily.    [provider]  predniSONE (DELTASONE) 5 MG tablet Take 5 mg by mouth daily.    [provider]  sulfaSALAzine (AZULFIDINE) 500 MG tablet Take 1,000 mg by mouth in the morning, at noon, in the evening, and at bedtime.    [provider]  tamsulosin (FLOMAX) 0.4 MG CAPS capsule Take 0.4 mg by mouth daily.    [provider]  traMADol (ULTRAM) 50 MG tablet Take 1 tablet (50 mg total) by mouth every 6 (six)  hours. Patient taking differently: Take 50 mg by mouth every 6 (six) hours as needed. 07/01/20   Carole Civil, MD  Zinc 50 MG TABS Take 1 tablet by mouth daily.    [provider]      Allergies    Morphine, Morphine and related, and Nirmatrelvir-ritonavir    Review of Systems   Review of Systems  Unable to perform ROS: Dementia   Physical Exam Updated Vital Signs BP 132/60    Pulse 92    Temp 100.3 F (37.9 C) (Rectal) Comment: edp notified   Resp (!) 21    Ht 6' (1.829 m)    Wt 73 kg    SpO2 96%    BMI 21.83 kg/m  Physical Exam Constitutional:      General: He is not in acute distress.    Appearance: He is normal weight. He is ill-appearing. He is not toxic-appearing or  diaphoretic.     Interventions: Nasal cannula in place.  HENT:     Head: Normocephalic and atraumatic.     Mouth/Throat:     Pharynx: Oropharynx is clear.  Cardiovascular:     Rate and Rhythm: Regular rhythm. Tachycardia present.  Pulmonary:     Effort: Pulmonary effort is normal. Tachypnea present.     Breath sounds: Rales present. No decreased breath sounds or wheezing.  Chest:     Chest wall: No tenderness.  Abdominal:     Palpations: Abdomen is soft.     Tenderness: There is no abdominal tenderness.  Musculoskeletal:     Cervical back: Normal range of motion.     Comments: Brace on right hip, bruising to left thigh  Skin:    Findings: Ecchymosis present.  Neurological:     Mental Status: Mental status is at baseline. He is disoriented.     Cranial Nerves: No facial asymmetry.    ED Results / Procedures / Treatments   Labs (all labs ordered are listed, but only abnormal results are displayed) Labs Reviewed  COMPREHENSIVE METABOLIC PANEL - Abnormal; Notable for the following components:      Result Value   Glucose, Bld 116 (*)    BUN 36 (*)    Total Protein 5.6 (*)    Albumin 2.9 (*)    All other components within normal limits  CBC WITH DIFFERENTIAL/PLATELET - Abnormal; Notable for the following components:   WBC 25.7 (*)    RBC 3.02 (*)    Hemoglobin 9.9 (*)    HCT 30.2 (*)    Neutro Abs 23.0 (*)    Monocytes Absolute 1.6 (*)    Abs Immature Granulocytes 0.18 (*)    All other components within normal limits  PROTIME-INR - Abnormal; Notable for the following components:   Prothrombin Time 16.2 (*)    INR 1.3 (*)    All other components within normal limits  URINALYSIS, ROUTINE W REFLEX MICROSCOPIC - Abnormal; Notable for the following components:   Color, Urine AMBER (*)    APPearance HAZY (*)    Hgb urine dipstick TRACE (*)    Bilirubin Urine SMALL (*)    Protein, ur TRACE (*)    All other components within normal limits  URINALYSIS, MICROSCOPIC (REFLEX) -  Abnormal; Notable for the following components:   Bacteria, UA FEW (*)    Non Squamous Epithelial PRESENT (*)    All other components within normal limits  BRAIN NATRIURETIC PEPTIDE - Abnormal; Notable for the following components:   B Natriuretic Peptide 153.0 (*)  All other components within normal limits  SEDIMENTATION RATE - Abnormal; Notable for the following components:   Sed Rate 70 (*)    All other components within normal limits  CULTURE, BLOOD (ROUTINE X 2)  CULTURE, BLOOD (ROUTINE X 2)  RESP PANEL BY RT-PCR (FLU A&B, COVID) ARPGX2  URINE CULTURE  MRSA NEXT GEN BY PCR, NASAL  LACTIC ACID, PLASMA  PROCALCITONIN  APTT  C-REACTIVE PROTEIN  BASIC METABOLIC PANEL  CBC  PROTIME-INR    EKG EKG Interpretation  Date/Time:  Sunday May 01 2021 10:58:48 EST Ventricular Rate:  113 PR Interval:  178 QRS Duration: 94 QT Interval:  332 QTC Calculation: 456 R Axis:   -25 Text Interpretation: Sinus tachycardia Abnormal R-wave progression, early transition Left ventricular hypertrophy Confirmed by Godfrey Pick ([id number]) on 05/01/2021 12:01:59 PM  Radiology CT Angio Chest PE W and/or Wo Contrast  Result Date: 05/01/2021 CLINICAL DATA:  Fever, shortness of breath and history pulmonary fibrosis with oxygen dependence. EXAM: CT ANGIOGRAPHY CHEST WITH CONTRAST TECHNIQUE: Multidetector CT imaging of the chest was performed using the standard protocol during bolus administration of intravenous contrast. Multiplanar CT image reconstructions and MIPs were obtained to evaluate the vascular anatomy. RADIATION DOSE REDUCTION: This exam was performed according to the departmental dose-optimization program which includes automated exposure control, adjustment of the mA and/or kV according to patient size and/or use of iterative reconstruction technique. CONTRAST:  79mL OMNIPAQUE IOHEXOL 350 MG/ML SOLN COMPARISON:  CT of the chest without contrast on 04/10/2014 FINDINGS: Cardiovascular: The pulmonary  arteries are adequately opacified. There is no evidence of acute pulmonary embolism. Mild central pulmonary artery dilatation with the main pulmonary artery measuring up to 3 cm is consistent with probable mild underlying pulmonary hypertension. The heart is mildly enlarged. Atherosclerosis of the thoracic aorta and calcification at the level of the aortic valve. No evidence of thoracic aortic aneurysm. Calcified coronary artery plaque present. No pericardial fluid. Mediastinum/Nodes: The esophagus is diffusely thickened and contains some fluid and debris with evidence of probable fundoplication and/or hiatal hernia repair in the past. Underlying esophageal pathology is not excluded including neoplasm or stricture. Compared to the prior CT, there is increased lymph node prominence in the mediastinum and right hilar region. Subcarinal lymph node tissue is ill-defined but measures roughly 2 cm in short axis. Next largest mediastinal lymph node is an AP window node measuring roughly 1.3 cm in short axis. Mildly prominent right hilar lymph node tissue. All of these lymph nodes may relate to reactive lymphadenopathy secondary to progressive lung disease. Lungs/Pleura: Significant progressive pulmonary fibrosis in both lungs as well as severe underlying emphysematous disease. In addition to fibrotic lung disease and cystic fibrotic lung disease there are areas of airspace disease per particularly in the right lower lobe and left lung base. Underlying pneumonia would be difficult to exclude. Correlation also suggested with the possibility of chronic aspiration given the esophageal abnormalities described above. No evidence of pleural fluid or pneumothorax. Upper Abdomen: No acute abnormality. Musculoskeletal: No chest wall abnormality. No acute or significant osseous findings. Review of the MIP images confirms the above findings. IMPRESSION: 1. No evidence of acute pulmonary embolism. 2. Abnormal esophagus by CT with  diffuse thickening and fluid and debris within the esophagus. There is evidence of prior fundoplication/hiatal hernia repair. Underlying esophageal pathology is not excluded including neoplasm or stricture. 3. Progressive severe pulmonary fibrosis in both lungs as well as areas of airspace disease particularly in the lower lobes, right greater than left. Underlying  pneumonia would be difficult to exclude. Correlation suggested with the possibility of chronic aspiration given esophageal abnormalities. 4. Mildly prominent mediastinal and right hilar lymph nodes may be reactive lymphadenopathy secondary to progressive lung disease. 5. Mildly dilated central pulmonary arteries are suggestive of some degree of underlying pulmonary hypertension. 6. Aortic and coronary atherosclerosis. Aortic Atherosclerosis (ICD10-I70.0) and Emphysema (ICD10-J43.9). Electronically Signed   By: Aletta Edouard M.D.   On: 05/01/2021 12:41   DG Chest Port 1 View  Result Date: 05/01/2021 CLINICAL DATA:  Possible sepsis. EXAM: PORTABLE CHEST 1 VIEW COMPARISON:  04/26/2021 FINDINGS: 1106 hours. Similar appearance of the diffuse peripherally predominant interstitial and patchy airspace opacity. No evidence of pleural effusion. The cardio pericardial silhouette is enlarged. The visualized bony structures of the thorax show no acute abnormality. Telemetry leads overlie the chest. IMPRESSION: Similar appearance of the diffuse peripherally predominant interstitial and patchy airspace opacity suggesting underlying chronic lung disease. Electronically Signed   By: Misty Stanley M.D.   On: 05/01/2021 11:20    Procedures Procedures    Medications Ordered in ED Medications  lactated ringers infusion ( Intravenous Infusion Verify 05/01/21 1805)  fentaNYL (SUBLIMAZE) injection 50 mcg (50 mcg Intravenous Given 05/01/21 1355)  vancomycin (VANCOREADY) IVPB 1500 mg/300 mL (0 mg Intravenous Stopped 05/01/21 1653)    Followed by  vancomycin (VANCOREADY)  IVPB 1250 mg/250 mL (has no administration in time range)  ceFEPIme (MAXIPIME) 2 g in sodium chloride 0.9 % 100 mL IVPB (has no administration in time range)  heparin injection 5,000 Units (has no administration in time range)  sodium chloride flush (NS) 0.9 % injection 3 mL (3 mLs Intravenous Not Given 05/01/21 1508)  sodium chloride flush (NS) 0.9 % injection 3 mL (3 mLs Intravenous Not Given 05/01/21 1508)  sodium chloride flush (NS) 0.9 % injection 3 mL (has no administration in time range)  0.9 %  sodium chloride infusion (has no administration in time range)  acetaminophen (TYLENOL) tablet 650 mg (has no administration in time range)    Or  acetaminophen (TYLENOL) suppository 650 mg (has no administration in time range)  HYDROmorphone (DILAUDID) injection 0.5-1 mg (0.5 mg Intravenous Given 05/01/21 1714)  senna-docusate (Senokot-S) tablet 1 tablet (has no administration in time range)  bisacodyl (DULCOLAX) EC tablet 5 mg (has no administration in time range)  ondansetron (ZOFRAN) tablet 4 mg (has no administration in time range)    Or  ondansetron (ZOFRAN) injection 4 mg (has no administration in time range)  ipratropium (ATROVENT) nebulizer solution 0.5 mg (has no administration in time range)  levalbuterol (XOPENEX) nebulizer solution 0.63 mg (has no administration in time range)  hydrALAZINE (APRESOLINE) injection 10 mg (has no administration in time range)  lactobacillus (FLORANEX/LACTINEX) granules 1 g (has no administration in time range)  aspirin chewable tablet 81 mg (has no administration in time range)  doxycycline (VIBRA-TABS) tablet 100 mg (has no administration in time range)  atorvastatin (LIPITOR) tablet 40 mg (has no administration in time range)  metoprolol tartrate (LOPRESSOR) tablet 12.5 mg (has no administration in time range)  DULoxetine (CYMBALTA) DR capsule 30 mg (has no administration in time range)  sulfaSALAzine (AZULFIDINE) tablet 1,000 mg (has no administration in  time range)  tamsulosin (FLOMAX) capsule 0.4 mg (has no administration in time range)  ferrous sulfate tablet 325 mg (has no administration in time range)  ascorbic acid (VITAMIN C) tablet 500 mg (has no administration in time range)  calcium carbonate (OS-CAL - dosed in mg of elemental calcium)  tablet 1,250 mg (has no administration in time range)  ergocalciferol (VITAMIN D2) capsule 50,000 Units (has no administration in time range)  multivitamin with minerals tablet 1 tablet (has no administration in time range)  polyvinyl alcohol (LIQUIFILM TEARS) 1.4 % ophthalmic solution 1 drop (has no administration in time range)  lactated ringers bolus 1,000 mL (1,000 mLs Intravenous New Bag/Given 05/01/21 1549)  methylPREDNISolone sodium succinate (SOLU-MEDROL) 40 mg/mL injection 40 mg (40 mg Intravenous Given 05/01/21 1745)  haloperidol lactate (HALDOL) injection 2 mg (has no administration in time range)  traZODone (DESYREL) tablet 50 mg (has no administration in time range)  LORazepam (ATIVAN) injection 0.5 mg (has no administration in time range)  Chlorhexidine Gluconate Cloth 2 % PADS 6 each (6 each Topical Given 05/01/21 1740)  lactated ringers bolus 500 mL (0 mLs Intravenous Stopped 05/01/21 1135)  fentaNYL (SUBLIMAZE) injection 50 mcg (50 mcg Intravenous Given 05/01/21 1125)  iohexol (OMNIPAQUE) 350 MG/ML injection 75 mL (75 mLs Intravenous Contrast Given 05/01/21 1216)  lactated ringers bolus 500 mL (0 mLs Intravenous Stopped 05/01/21 1400)  ceFEPIme (MAXIPIME) 2 g in sodium chloride 0.9 % 100 mL IVPB (0 g Intravenous Stopped 05/01/21 1337)  metroNIDAZOLE (FLAGYL) IVPB 500 mg (0 mg Intravenous Stopped 05/01/21 1406)    ED Course/ Medical Decision Making/ A&P                           Medical Decision Making Amount and/or Complexity of Data Reviewed Labs: ordered. Radiology: ordered. ECG/medicine tests: ordered.  Risk Prescription drug management. Decision regarding hospitalization.   This patient  presents to the ED for concern of fever, this involves an extensive number of treatment options, and is a complaint that carries with it a high risk of complications and morbidity.  The differential diagnosis includes viral illness, acute bacterial infection, sepsis, PE   Co morbidities that complicate the patient evaluation  Dementia, recent left hip injury, HTN, pulmonary fibrosis, rheumatoid arthritis, paroxysmal atrial fibrillation, HLD, CAD, chronic anemia, chronic hypoxia   Additional history obtained:  Additional history obtained from EMS, patient's wife External records from outside source obtained and reviewed including EMR   Lab Tests:  I Ordered, and personally interpreted labs.  The pertinent results include: Prominent leukocytosis with neutrophilia, decreased hemoglobin from 10 months ago, normal electrolytes, possible UTI   Imaging Studies ordered:  I ordered imaging studies including chest x-ray, CTA chest I independently visualized and interpreted imaging which showed no evidence of PE, chronic pulmonary fibrosis is present with possible underlying pneumonia, diffuse esophageal thickening, prominent mediastinal and right hilar lymph nodes. I agree with the radiologist interpretation   Cardiac Monitoring:  The patient was maintained on a cardiac monitor.  I personally viewed and interpreted the cardiac monitored which showed an underlying rhythm of: Sinus rhythm   Medicines ordered and prescription drug management:  I ordered medication including broad-spectrum antibiotics for concern of sepsis and failure of antibiotics; gentle IV fluids given his chronic lung disease Reevaluation of the patient after these medicines showed that the patient stayed the same I have reviewed the patients home medicines and have made adjustments as needed  Problem List / ED Course:  88 year old male with history of dementia and recent left hip injury, currently residing at [facility], presents for fever.  He has been undergoing antibiotics for what is described as pneumonia prophylaxis.  He has been on cefdinir and doxycycline.  On arrival, patient is afebrile, although he  did reportedly receive Tylenol PTA.  He is tachycardic and tachypneic.  On exam, he has a right hip brace in place and bruising to the left hip.  Per chart review, patient recently suffered a left hip fracture which was managed nonoperatively.  He is on his baseline 2 L of supplemental oxygen.  Laboratory work-up was initiated which showed a prominent leukocytosis.  Given his tachypnea and tachycardia, patient was treated with broad-spectrum antibiotics.  While in the ED, patient did develop a low-grade fever.  Given his chronic lung disease, gentle IV fluids were given.  Patient has had recent immobility and is not on any blood thinning medication.  CTA of chest was ordered.  Although patient does not have a PE, there were findings of chronic lung disease with possible overlying pneumonia.  Patient's urinalysis was also concerning for possible infection.  Given his requirement for further monitoring and IV antibiotics, patient was admitted to hospitalist.   Reevaluation:  After the interventions noted above, I reevaluated the patient and found that they have :stayed the same   Social Determinants of Health:  Dementia, immobility, current left hip injury   Dispostion:  After consideration of the diagnostic results and the patients response to treatment, I feel that the patent would benefit from admission to hospital.          Final Clinical Impression(s) / ED Diagnoses Final diagnoses:  Fever in adult  Leukocytosis, unspecified type    Rx / DC Orders ED Discharge Orders     None         Godfrey Pick, MD 05/01/21 [phone]

## 2021-05-01 NOTE — Assessment & Plan Note (Addendum)
-   Stable -Outpatient monitoring/repeat imaging as previously advised

## 2021-05-01 NOTE — Assessment & Plan Note (Addendum)
-   Severe pulmonary fibrosis//COPD -Patient with history of heavy tobacco use previously -PTA on home O2 at 2 L chronically -Also chronically on steroids more for rheumatoid arthritis than pulmonary issues -Continue PTA bronchodilators, steroids -Patient with chronic hypoxic respiratory failure, appears to be at baseline at this time, C/n supplemental oxygen at 2 L/min continuously which is patient's baseline

## 2021-05-01 NOTE — ED Triage Notes (Signed)
Patient has hx of pulmonary fibrosis with 2L/[place] at home all the time. [facility] sent patient here due to low grade fever and SHOB. Recently on antibiotics to prevent pneumonia due to hx.

## 2021-05-01 NOTE — Assessment & Plan Note (Addendum)
-   Remained stable, continue current medication including metoprolol

## 2021-05-01 NOTE — ED Notes (Signed)
Wife at bedside. States pt broke L hip last Monday with pt unable to get surgery. Reports has a hx of right hip popping out of place, currently with brace to right hip. Pt reporting 10/10 pain to right hip. EDP notified. Wife reports pt being fall prone with dementia and they found him on the floor beside bed around 11am yesterday at [facility]. Generalized bruising to arms and legs. No sign of hitting head. Denies LOC.

## 2021-05-01 NOTE — Assessment & Plan Note (Addendum)
-   We will continue iron supplements -Advised transfusion of 4 units of PRBC on 05/04/2021 and repeat CBC on Monday, 05/09/2021

## 2021-05-01 NOTE — Assessment & Plan Note (Addendum)
-   Stable continue home medication of aspirin,  Metoprolol -Discussed with wife and granddaughter advised to discontinue Lipitor (Risk Versus benefits reviewed)

## 2021-05-01 NOTE — H&P (Signed)
History and Physical   Patient: [name]                            PCP: Lajean Manes, MD                    DOB: [birth year]            DOA: 05/01/2021 ZLD:[id number]             DOS: 05/01/2021, 3:29 PM  Lajean Manes, MD  Patient coming from:   HOME  I have personally reviewed patient's medical records, in electronic medical records, including:  [facility], and care everywhere.    Chief Complaint:   Chief Complaint  Patient presents with   Fever   Shortness of Breath    History of present illness:   [name] is a 88 year old male chronic respiratory failure COPD/pulmonary fibrosis 2 L dependent, CAD, MI, HTN, HLD, RA,AAA, dementia,   Presenting from [facility], SNF concern for fever.  Patient was recently hospitalized at [facility] from 1 32-2 after mechanical fall sustaining hip fracture. It was determined that his proximal femur fracture was not amendable to ORIF therefore was placed in a hip brace on the right side.  And was sent for rehab. Patient has not been anticoagulated due to dementia and falls.  He has chronic respiratory failure due to pulmonary fibrosis on 2 L of supplemental oxygen continuously. He has been empirically on cefdinir and doxycycline for empiric treatment of possible pneumonia at the facility.    Patient Denies having: Chills, Cough, Chest Pain, Abd pain, N/V/D, headache, dizziness, lightheadedness,  Dysuria, Joint pain, rash, open wounds  ED Course:   Blood pressure 111/60, pulse 100, temperature 100.3 F (37.9 C), temperature source Rectal, resp. rate (!) 25, height 6' (1.829 m), weight 73 kg, SpO2 100 %.   ED: Upon arrival Temp: 100.7, RR 28 BP 96/54, WBC of 25.7 UA amber, trace of hemoglobin, protein, few bacteria WBC CTA: Negative for any PE chronic residual lung disease, pulmonary fibrosis, interstitial lung disease, pneumonia could not do review abnormal esophageal findings seems to be chronic with hiatal hernia,  mediastinal lymphadenopathy mild, possible mild pulmonary hypertension, aortic and coronary atherosclerotic disease   -Cultures were obtained, patient was started on broad-spectrum antibiotics of cefepime and vancomycin   Review of Systems: As per HPI, otherwise 10 point review of systems were negative.   ----------------------------------------------------------------------------------------------------------------------  Allergies  Allergen Reactions   Morphine Other (See Comments)    "makes me crazy"   Morphine And Related Nausea And Vomiting and Other (See Comments)    Hallucinations    Nirmatrelvir-Ritonavir Other (See Comments)    Other reaction(s): confusion     Home MEDs:  Prior to Admission medications   Medication Sig Start Date End Date Taking? Authorizing Provider  acetaminophen (TYLENOL) 500 MG tablet Take 500 mg by mouth in the morning and at bedtime.    [provider]  Ascorbic Acid (VITAMIN C) 500 MG CAPS Take 1 tablet by mouth daily.    [provider]  aspirin 81 MG tablet Take 81 mg by mouth daily.    [provider]  atorvastatin (LIPITOR) 40 MG tablet Take 40 mg by mouth daily.    [provider]  calcium carbonate (OS-CAL) 600 MG TABS Take 600 mg by mouth daily.    [provider]  carboxymethylcellulose (REFRESH PLUS) 0.5 % SOLN  1 drop 3 (three) times daily as needed (dry eyes).    [provider]  cefdinir (OMNICEF) 300 MG capsule Take 300 mg by mouth 2 (two) times daily.    [provider]  doxycycline (VIBRAMYCIN) 100 MG capsule Take 100 mg by mouth 2 (two) times daily.    [provider]  DULoxetine (CYMBALTA) 30 MG capsule Take 30 mg by mouth daily.    [provider]  ergocalciferol (VITAMIN D2) 1.25 MG (50000 UT) capsule Take 50,000 Units by mouth once a week. Sunday    [provider]  famotidine (PEPCID) 40 MG tablet Take 40 mg by mouth daily. 10/11/18    [provider]  ferrous sulfate 325 (65 FE) MG tablet Take 325 mg by mouth daily with breakfast.    [provider]  hydroxychloroquine (PLAQUENIL) 200 MG tablet Take 200 mg by mouth daily.    [provider]  melatonin 5 MG TABS Take 5 mg by mouth at bedtime.    [provider]  metoprolol tartrate (LOPRESSOR) 25 MG tablet Take 0.5 tablets (12.5 mg total) by mouth 2 (two) times daily. 12/29/20   Belva Crome, MD  Multiple Vitamin (MULTIVITAMIN) capsule Take 1 capsule by mouth daily.    [provider]  Multiple Vitamins-Minerals (OCUVITE PRESERVISION PO) Take 1 tablet by mouth daily.    [provider]  Omega-3 Fatty Acids (FISH OIL) 1000 MG CAPS Take 1 capsule by mouth daily.    [provider]  predniSONE (DELTASONE) 5 MG tablet Take 5 mg by mouth daily.    [provider]  sulfaSALAzine (AZULFIDINE) 500 MG tablet Take 1,000 mg by mouth in the morning, at noon, in the evening, and at bedtime.    [provider]  tamsulosin (FLOMAX) 0.4 MG CAPS capsule Take 0.4 mg by mouth daily.    [provider]  traMADol (ULTRAM) 50 MG tablet Take 1 tablet (50 mg total) by mouth every 6 (six) hours. Patient taking differently: Take 50 mg by mouth every 6 (six) hours as needed. 07/01/20   Carole Civil, MD  Zinc 50 MG TABS Take 1 tablet by mouth daily.    [provider]    PRN MEDs: sodium chloride, acetaminophen **OR** acetaminophen, bisacodyl, fentaNYL (SUBLIMAZE) injection, hydrALAZINE, HYDROmorphone (DILAUDID) injection, ipratropium, levalbuterol, ondansetron **OR** ondansetron (ZOFRAN) IV, polyvinyl alcohol, senna-docusate, sodium chloride flush, traZODone  Past Medical History:  Diagnosis Date   AAA (abdominal aortic aneurysm)    Anemia    Arthritis    ra   Atrial fibrillation (HCC)    COPD (chronic obstructive pulmonary disease) ([place]) Jan.2016   Small amount of Emphysema- Pulmonary Fibrosis  ( Feb. 9, 2016 )   Coronary atherosclerosis    Dysrhythmia    A-Fib   Hematuria, gross    HISTORY OF  ?  2004   Hypertension    dr Daneen Schick   Immune disorder [facility])    Myocardial infarction ([place]) 2000   Pulmonary fibrosis ([place])    Rheumatoid arthritis ([place])    Shortness of breath    with exertion   Staph skin infection    Back    Past Surgical History:  Procedure Laterality Date   ABDOMINAL AORTIC ENDOVASCULAR STENT GRAFT N/A 01/06/2016   Procedure: ABDOMINAL AORTIC ENDOVASCULAR STENT GRAFT;  Surgeon: Waynetta Sandy, MD;  Location: [facility] OR;  Service: Vascular;  Laterality: N/A;   arm surgery     result of staph infection  BACK SURGERY     CORONARY ANGIOPLASTY     ESOPHAGOGASTRODUODENOSCOPY  2004    ?   esophagus wrapped     EYE SURGERY Bilateral 03-14-13   [place]   HAND SURGERY     rel to [place] Right 06/30/2020   Procedure: CLOSED REDUCTION HIP;  Surgeon: Carole Civil, MD;  Location: [facility] OR[facility];  Service: Orthopedics;  Laterality: Right;   JOINT REPLACEMENT     bil hips   JOINT REPLACEMENT Right 01-20-13   Knee   stents  2000   TOTAL KNEE ARTHROPLASTY Right 01/20/2013   Procedure: RIGHT TOTAL KNEE ARTHROPLASTY;  Surgeon: Kerin Salen, MD;  Location: [facility];  Service: Orthopedics;  Laterality: Right;     reports that he quit smoking about 43 years ago. His smoking use included cigarettes. He has a 150.00 pack-year smoking history. He has quit using smokeless tobacco.  His smokeless tobacco use included chew. He reports that he does not drink alcohol and does not use drugs.   Family History  Problem Relation Age of Onset   Heart disease Brother        Heart Disease before age 65   Stroke Brother    Heart attack Brother    Hypertension Brother    Diabetes Son    Heart disease Son        Heart Disease before age 59   Hypertension Son    Heart attack Son    Bleeding Disorder Father    COPD Father        Emphysema   Varicose  Veins Sister    COPD Sister     Physical Exam:   Vitals:   05/01/21 1200 05/01/21 1230 05/01/21 1244 05/01/21 1400  BP: (!) 106/59 (!) 101/59  111/60  Pulse: (!) 102 (!) 103  100  Resp: (!) 26 (!) 25  (!) 25  Temp:   100.3 F (37.9 C)   TempSrc:   Rectal   SpO2: 100% 100%  100%  Weight:      Height:       Constitutional: NAD, calm, comfortable Eyes: PERRL, lids and conjunctivae normal ENMT: Mucous membranes are moist. Posterior pharynx clear of any exudate or lesions.Normal dentition.  Neck: normal, supple, no masses, no thyromegaly Respiratory: clear to auscultation bilaterally, no wheezing, no crackles. Normal respiratory effort. No accessory muscle use.  Cardiovascular: Regular rate and rhythm, no murmurs / rubs / gallops. No extremity edema. 2+ pedal pulses. No carotid bruits.  Abdomen: no tenderness, no masses palpated. No hepatosplenomegaly. Bowel sounds positive.  Musculoskeletal: Right hip pain, discomfort, limited range of motion due to recent fracture and brace no clubbing / cyanosis. No joint deformity upper and lower extremities.  Neurologic: CN II-XII grossly intact. Sensation intact, DTR normal. Strength 5/5 in all 4.  Psychiatric: Normal judgment and insight. Alert and oriented x 3. Normal mood.  Skin: no rashes, lesions, ulcers. No induration Wounds: per nursing documentation    Labs on admission:    I have personally reviewed following labs and imaging studies  CBC: Recent Labs  Lab 05/01/21 1113  WBC 25.7*  NEUTROABS 23.0*  HGB 9.9*  HCT 30.2*  MCV 100.0  PLT 481   Basic Metabolic Panel: Recent Labs  Lab 05/01/21 1113  NA 138  K 4.2  CL 103  CO2 23  GLUCOSE 116*  BUN 36*  CREATININE 0.93  CALCIUM 9.2   GFR: Estimated Creatinine Clearance: 56.7 mL/min (by C-G  formula based on SCr of 0.93 mg/dL). Liver Function Tests: Recent Labs  Lab 05/01/21 1113  AST 40  ALT 28  ALKPHOS 82  BILITOT 1.2  PROT 5.6*  ALBUMIN 2.9*   No results  for input(s): LIPASE, AMYLASE in the last 168 hours. No results for input(s): AMMONIA in the last 168 hours. Coagulation Profile: Recent Labs  Lab 05/01/21 1113  INR 1.3*    Urine analysis:    Component Value Date/Time   COLORURINE AMBER (A) 05/01/2021 1315   APPEARANCEUR HAZY (A) 05/01/2021 1315   LABSPEC 1.025 05/01/2021 1315   PHURINE 5.0 05/01/2021 1315   GLUCOSEU NEGATIVE 05/01/2021 1315   HGBUR TRACE (A) 05/01/2021 1315   BILIRUBINUR SMALL (A) 05/01/2021 1315   KETONESUR NEGATIVE 05/01/2021 1315   PROTEINUR TRACE (A) 05/01/2021 1315   UROBILINOGEN 1.0 01/13/2013 0934   NITRITE NEGATIVE 05/01/2021 1315   LEUKOCYTESUR NEGATIVE 05/01/2021 1315    Last A1C:  No results found for: HGBA1C   Radiologic Exams on Admission:   CT Angio Chest PE W and/or Wo Contrast  Result Date: 05/01/2021 CLINICAL DATA:  Fever, shortness of breath and history pulmonary fibrosis with oxygen dependence. EXAM: CT ANGIOGRAPHY CHEST WITH CONTRAST TECHNIQUE: Multidetector CT imaging of the chest was performed using the standard protocol during bolus administration of intravenous contrast. Multiplanar CT image reconstructions and MIPs were obtained to evaluate the vascular anatomy. RADIATION DOSE REDUCTION: This exam was performed according to the departmental dose-optimization program which includes automated exposure control, adjustment of the mA and/or kV according to patient size and/or use of iterative reconstruction technique. CONTRAST:  90m OMNIPAQUE IOHEXOL 350 MG/ML SOLN COMPARISON:  CT of the chest without contrast on 04/10/2014 FINDINGS: Cardiovascular: The pulmonary arteries are adequately opacified. There is no evidence of acute pulmonary embolism. Mild central pulmonary artery dilatation with the main pulmonary artery measuring up to 3 cm is consistent with probable mild underlying pulmonary hypertension. The heart is mildly enlarged. Atherosclerosis of the thoracic aorta and calcification at  the level of the aortic valve. No evidence of thoracic aortic aneurysm. Calcified coronary artery plaque present. No pericardial fluid. Mediastinum/Nodes: The esophagus is diffusely thickened and contains some fluid and debris with evidence of probable fundoplication and/or hiatal hernia repair in the past. Underlying esophageal pathology is not excluded including neoplasm or stricture. Compared to the prior CT, there is increased lymph node prominence in the mediastinum and right hilar region. Subcarinal lymph node tissue is ill-defined but measures roughly 2 cm in short axis. Next largest mediastinal lymph node is an AP window node measuring roughly 1.3 cm in short axis. Mildly prominent right hilar lymph node tissue. All of these lymph nodes may relate to reactive lymphadenopathy secondary to progressive lung disease. Lungs/Pleura: Significant progressive pulmonary fibrosis in both lungs as well as severe underlying emphysematous disease. In addition to fibrotic lung disease and cystic fibrotic lung disease there are areas of airspace disease per particularly in the right lower lobe and left lung base. Underlying pneumonia would be difficult to exclude. Correlation also suggested with the possibility of chronic aspiration given the esophageal abnormalities described above. No evidence of pleural fluid or pneumothorax. Upper Abdomen: No acute abnormality. Musculoskeletal: No chest wall abnormality. No acute or significant osseous findings. Review of the MIP images confirms the above findings. IMPRESSION: 1. No evidence of acute pulmonary embolism. 2. Abnormal esophagus by CT with diffuse thickening and fluid and debris within the esophagus. There is evidence of prior fundoplication/hiatal hernia repair. Underlying esophageal  pathology is not excluded including neoplasm or stricture. 3. Progressive severe pulmonary fibrosis in both lungs as well as areas of airspace disease particularly in the lower lobes, right  greater than left. Underlying pneumonia would be difficult to exclude. Correlation suggested with the possibility of chronic aspiration given esophageal abnormalities. 4. Mildly prominent mediastinal and right hilar lymph nodes may be reactive lymphadenopathy secondary to progressive lung disease. 5. Mildly dilated central pulmonary arteries are suggestive of some degree of underlying pulmonary hypertension. 6. Aortic and coronary atherosclerosis. Aortic Atherosclerosis (ICD10-I70.0) and Emphysema (ICD10-J43.9). Electronically Signed   By: Aletta Edouard M.D.   On: 05/01/2021 12:41   DG Chest Port 1 View  Result Date: 05/01/2021 CLINICAL DATA:  Possible sepsis. EXAM: PORTABLE CHEST 1 VIEW COMPARISON:  04/26/2021 FINDINGS: 1106 hours. Similar appearance of the diffuse peripherally predominant interstitial and patchy airspace opacity. No evidence of pleural effusion. The cardio pericardial silhouette is enlarged. The visualized bony structures of the thorax show no acute abnormality. Telemetry leads overlie the chest. IMPRESSION: Similar appearance of the diffuse peripherally predominant interstitial and patchy airspace opacity suggesting underlying chronic lung disease. Electronically Signed   By: Misty Stanley M.D.   On: 05/01/2021 11:20    EKG:   Independently reviewed.  Orders placed or performed during the hospital encounter of 05/01/21   ED EKG 12-Lead   ED EKG 12-Lead   EKG 12-Lead   EKG 12-Lead   EKG 12-Lead   ---------------------------------------------------------------------------------------------------------------------------------------    Assessment / Plan:   Principal Problem:   Sepsis ([place]) Active Problems:   Pulmonary fibrosis assoc with RA    CAD (coronary artery disease), native coronary artery   Paroxysmal atrial fibrillation (HCC)   Hyperlipidemia   Abdominal aortic aneurysm without rupture   Essential hypertension   Closed left hip fracture (HCC)   Rheumatoid  arteritis (HCC)   Chronic respiratory failure with hypoxia (HCC)   Chronic diastolic (congestive) heart failure (HCC)   Vascular dementia without behavioral disturbance (HCC)   Iron deficiency anemia due to chronic blood loss   Assessment and Plan: * Sepsis ([place])- (present on admission) -Sepsis -[name] etiology likely UTI versus pneumonia  -On arrival met sepsis criteria: TM 100.3, PR 103,, RR 25, BP 95/57, -WBC 25.7  - CT angiogram reviewed-chronic pulmonary fibrosis pneumonia could not be ruled out-diffuse esophageal changes possible strictures-chronic  -Urine cloudy trace of hemoglobin few bacteria, WBC 6-10  -Blood and urine culture has been obtained in ED  -We will follow-up with blood/urine cultures  -Patient presenting from [facility] -Patient has been empirically antibiotics at the SNF doxycycline, Omnicef,  -Gentle IV fluid hydration has been initiated due to chronic pulmonary fibrosis  -Broad-spectrum IV antibiotic initiated in ED cefepime and vancomycin  SIRS (systemic inflammatory response syndrome) (HCC)-resolved as of 05/01/2021, (present on admission) -Sepsis -[name] etiology likely UTI versus pneumonia  -On arrival met sepsis criteria: TM 100.3, PR 103,, RR 25, BP 95/57, -WBC 25.7  - CT angiogram reviewed-chronic pulmonary fibrosis pneumonia could not be ruled out-diffuse esophageal changes possible strictures-chronic  -Urine cloudy trace of hemoglobin few bacteria, WBC 6-10  -Blood and urine culture has been obtained in ED  -We will follow-up with blood/urine cultures  -Patient presenting from [facility] SNF -Patient has been empirically antibiotics at the SNF doxycycline, Omnicef,  -Gentle IV fluid hydration has been initiated due to chronic pulmonary fibrosis  -Broad-spectrum IV antibiotic initiated in ED cefepime and vancomycin  Pulmonary fibrosis assoc with RA - (present on admission) -  Extensive history of pulmonary fibrosis -CT angiogram  reviewed revealed consistent findings With chronic interstitial lung disease -We will continue supportive therapy, supplement oxygen, DuoNeb bronchodilator, mucolytic's, -With holding steroids at this time -Consulted pulmonologist for further input and assist  CAD (coronary artery disease), native coronary artery- (present on admission) - Stable continue home medication of aspirin, Lipitor, metoprolol  Paroxysmal atrial fibrillation ([place])- (present on admission) - Currently not on any chronic anticoagulation therapy with exception of aspirin  (Patient has a high [place] Vascor, exclusively with hip fracture high risk for DVT also may benefit from anticoagulation-with chronic medical condition debility, and dementia might be a very) -On metoprolol, for rate control,  Hyperlipidemia- (present on admission) - Continue statin-atorvastatin  Abdominal aortic aneurysm without rupture- (present on admission) - Stable -We will continue current medication, close follow-up with outpatient  Essential hypertension- (present on admission) - Remained stable, continue current medication including metoprolol -As needed hydralazine  Closed left hip fracture ([place])- (present on admission) - Apparently patient had a spontaneous left hip fracture, was not a candidate for ORIF due to chronic advanced lung disease, pulmonary fibrosis -Currently in a brace, and SNF, pursuing PT, and hopeful for resolution, spontaneous healing  Rheumatoid arteritis ([place])- (present on admission) - Stable as needed analgesics, -It appears the patient is on hydroxychloroquine  Chronic respiratory failure with hypoxia ([place])- (present on admission) - Continue supportive therapy, supplemental oxygen -As needed DuoNeb bronchodilators -Chronically on steroids currently on 5 mg of prednisone daily  Chronic diastolic (congestive) heart failure ([place])- (present on admission) - Gentle IV fluid hydration at this time as patient meets  SIRS, ruling out sepsis -otherwise we will monitor daily weight, I's and O's -At this point seem to be compensated -Not on any chronic diuretics   Last Echo reviewed from 05/12/2020: EJf: 45 to 50%. The left ventricle has mildly decreased function. The left ventricle demonstrates global hypokinesis. There is mild concentric left ventricular hypertrophy. Left ventricular diastolic  parameters are consistent with Grade I diastolic dysfunction (impaired relaxation   Vascular dementia without behavioral disturbance ([place])- (present on admission) - Currently stable -Continue supportive therapy, reorientation -Continue home Cymbalta,   Iron deficiency anemia due to chronic blood loss - We will continue iron supplements     Consults called: Pulmonary -------------------------------------------------------------------------------------------------------------------------------------------- DVT prophylaxis:  heparin injection 5,000 Units Start: 05/01/21 2200 TED hose Start: 05/01/21 1432 SCDs Start: 05/01/21 1432   Code Status:   Code Status: Full Code   Admission status: Patient will be admitted as Inpatient, with a greater than 2 midnight length of stay. Level of care: Stepdown   Family Communication:  none at bedside  (The above findings and plan of care has been discussed with patient in detail, the patient expressed understanding and agreement of above plan)  --------------------------------------------------------------------------------------------------------------------------------------------------  Disposition Plan: >3 days Status is: Inpatient Remains inpatient appropriate because: Meeting sepsis criteria therefore needing aggressive treatment including IV fluids, IV antibiotics, cultures, pulmonary critical care input    ---------------------------------------------------------------------------------------------------------------------------------------------  Time  spent: > than  30  Min.   SIGNED: Deatra James, MD, FHM. [facility],  Pager (Please use [email or web address] to page to text)  If 7PM-7AM, please contact night-coverage [email or web address],  05/01/2021, 3:29 PM

## 2021-05-01 NOTE — Assessment & Plan Note (Signed)
-   Continue statin-atorvastatin

## 2021-05-01 NOTE — Assessment & Plan Note (Addendum)
-  Advanced Dementia with significant Cognitive and Memory Deficits -Continue supportive therapy, reorientation -Continue home Cymbalta, trazodone nightly

## 2021-05-02 ENCOUNTER — Inpatient Hospital Stay (HOSPITAL_COMMUNITY): Payer: Medicare Other

## 2021-05-02 DIAGNOSIS — M0520 Rheumatoid vasculitis with rheumatoid arthritis of unspecified site: Secondary | ICD-10-CM

## 2021-05-02 DIAGNOSIS — J690 Pneumonitis due to inhalation of food and vomit: Secondary | ICD-10-CM

## 2021-05-02 DIAGNOSIS — Z7189 Other specified counseling: Secondary | ICD-10-CM

## 2021-05-02 DIAGNOSIS — R3912 Poor urinary stream: Secondary | ICD-10-CM

## 2021-05-02 DIAGNOSIS — A419 Sepsis, unspecified organism: Principal | ICD-10-CM

## 2021-05-02 DIAGNOSIS — J8410 Pulmonary fibrosis, unspecified: Secondary | ICD-10-CM

## 2021-05-02 DIAGNOSIS — R6520 Severe sepsis without septic shock: Secondary | ICD-10-CM

## 2021-05-02 DIAGNOSIS — S72002G Fracture of unspecified part of neck of left femur, subsequent encounter for closed fracture with delayed healing: Secondary | ICD-10-CM

## 2021-05-02 DIAGNOSIS — J9601 Acute respiratory failure with hypoxia: Secondary | ICD-10-CM

## 2021-05-02 DIAGNOSIS — F0150 Vascular dementia without behavioral disturbance: Secondary | ICD-10-CM

## 2021-05-02 DIAGNOSIS — J9611 Chronic respiratory failure with hypoxia: Secondary | ICD-10-CM

## 2021-05-02 DIAGNOSIS — I5032 Chronic diastolic (congestive) heart failure: Secondary | ICD-10-CM

## 2021-05-02 DIAGNOSIS — Z515 Encounter for palliative care: Secondary | ICD-10-CM

## 2021-05-02 DIAGNOSIS — N401 Enlarged prostate with lower urinary tract symptoms: Secondary | ICD-10-CM

## 2021-05-02 LAB — BASIC METABOLIC PANEL
Anion gap: 8 (ref 5–15)
BUN: 31 mg/dL — ABNORMAL HIGH (ref 8–23)
CO2: 21 mmol/L — ABNORMAL LOW (ref 22–32)
Calcium: 8.7 mg/dL — ABNORMAL LOW (ref 8.9–10.3)
Chloride: 110 mmol/L (ref 98–111)
Creatinine, Ser: 0.69 mg/dL (ref 0.61–1.24)
GFR, Estimated: >60 mL/min (ref >60)
Glucose, Bld: 102 mg/dL — ABNORMAL HIGH (ref 70–99)
Potassium: 4.3 mmol/L (ref 3.5–5.1)
Sodium: 139 mmol/L (ref 135–145)

## 2021-05-02 LAB — MRSA NEXT GEN BY PCR, NASAL: MRSA by PCR Next Gen: NOT DETECTED

## 2021-05-02 LAB — CBC
HCT: 25 % — ABNORMAL LOW (ref 39.0–52.0)
Hemoglobin: 8.1 g/dL — ABNORMAL LOW (ref 13.0–17.0)
MCH: 33.2 pg (ref 26.0–34.0)
MCHC: 32.4 g/dL (ref 30.0–36.0)
MCV: 102.5 fL — ABNORMAL HIGH (ref 80.0–100.0)
Platelets: 249 10*3/uL (ref 150–400)
RBC: 2.44 MIL/uL — ABNORMAL LOW (ref 4.22–5.81)
RDW: 14.6 % (ref 11.5–15.5)
WBC: 16.3 10*3/uL — ABNORMAL HIGH (ref 4.0–10.5)
nRBC: 0 % (ref 0.0–0.2)

## 2021-05-02 LAB — PROTIME-INR
INR: 1.3 — ABNORMAL HIGH (ref 0.8–1.2)
Prothrombin Time: 16.6 seconds — ABNORMAL HIGH (ref 11.4–15.2)

## 2021-05-02 LAB — GLUCOSE, CAPILLARY: Glucose-Capillary: 110 mg/dL — ABNORMAL HIGH (ref 70–99)

## 2021-05-02 MED ORDER — METHYLPREDNISOLONE SODIUM SUCC 40 MG IJ SOLR
20.0000 mg | INTRAMUSCULAR | Status: DC
  Administered 2021-05-02 – 2021-05-04 (×3): 20 mg via INTRAVENOUS
  Filled 2021-05-02 (×3): qty 1

## 2021-05-02 MED ORDER — SODIUM CHLORIDE 0.9 % IV SOLN
2.0000 g | Freq: Three times a day (TID) | INTRAVENOUS | Status: DC
  Administered 2021-05-02 – 2021-05-03 (×4): 2 g via INTRAVENOUS
  Filled 2021-05-02 (×4): qty 2

## 2021-05-02 MED ORDER — METRONIDAZOLE 500 MG PO TABS
500.0000 mg | ORAL_TABLET | Freq: Two times a day (BID) | ORAL | Status: DC
  Administered 2021-05-02 – 2021-05-03 (×3): 500 mg via ORAL
  Filled 2021-05-02 (×3): qty 1

## 2021-05-02 NOTE — Consult Note (Signed)
Consultation Note Date: 05/02/2021   Patient Name: [name]  DOB: 09/07/1930  MRN: [id number]  Age / Sex: 88 y.o., male  PCP: Lajean Manes, MD Referring Physician: Deatra James, MD  Reason for Consultation: Establishing goals of care  HPI/Patient Profile: 88 y.o. male  with past medical history of dementia, atrial fibrillation, CAD, hypertension, MI, pulmonary fibrosis on 2L chronically, AAA, anemia, rheumatoid arthritis admitted on 05/01/2021 with fever, shortness of breath from [facility] where he was on cefdinir and doxycycline for possible pneumonia. Admitted with sepsis pneumonia vs UTI. Of note he was recently hospitalized 1/30-04/28/21 for fall at home with resultant L hip fracture with decision that fracture was not amendable for surgical repair and he was sent to SNF rehab. Family have noted that he had a fall at [facility] rehab 04/30/21.   Clinical Assessment and Goals of Care: I met today with Mr. Daubert with his son (one of his 8 children) and daughter-in-law at bedside. His wife has gone home to rest. Mr. Sidor is lying in bed and is smiling and interactive. I asked him how he feels and he replies "with my hands!" Family at bedside confirm that this is his personality. They share more about Mr. 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 He is originally from [place] of [place].   Family at bedside are reassured by what they see today. They feel that he looks much better today. They express that his falls and complications over the past month have been difficult on Mr. Sayres and his wife. She has left briefly and family offered to call her but it is not urgent and I will speak with her later today or tomorrow. I expressed how difficult this past month or so has been on their family - especially Mr. and Mrs. Sheldon. I expressed the importance of  self care and I am glad his wife has left to get some rest and a break. Family do express that she has been faithfully by his side but in his confusion he often takes his frustration out on his Family express how strong Mr. Pardini has always been and they are hopeful for improvement. I did explain that he seems to be progressing well for his current acute infection and illness. I did not discuss with family at bedside today overall expectations. I will follow up and have further conversation with wife when she returns to bedside.   All questions/concerns addressed. Emotional support provided.   Primary Decision Maker NEXT OF KIN wife Baldo Ash    SUMMARY OF RECOMMENDATIONS   - Further Dolores conversation needed with wife  Code Status/Advance Care Planning: DNR   Symptom Management:  Per attending.   Palliative Prophylaxis:  Aspiration, Bowel Regimen, Delirium Protocol, Frequent Pain Assessment, and Turn Reposition  Prognosis:  Overall prognosis poor with advancing dementia.   Discharge Planning: To Be Determined      Primary Diagnoses: Present on  Admission:  (Resolved) SIRS (systemic inflammatory response syndrome) (HCC)  CAD (coronary artery disease), native coronary artery  Hyperlipidemia  Paroxysmal atrial fibrillation (HCC)  Pulmonary fibrosis assoc with RA   Essential hypertension  Abdominal aortic aneurysm without rupture  Closed left hip fracture (HCC)  Rheumatoid arteritis (HCC)  Chronic respiratory failure with hypoxia (HCC)  Chronic diastolic (congestive) heart failure ([place])  Vascular dementia without behavioral disturbance ([place])  Sepsis ([place])   I have reviewed the medical record, interviewed the patient and family, and examined the patient. The following aspects are pertinent.  Past Medical History:  Diagnosis Date   AAA (abdominal aortic aneurysm)    Anemia    Arthritis    ra   Atrial fibrillation (HCC)    COPD (chronic obstructive pulmonary disease) ([place])  Jan.2016   Small amount of Emphysema- Pulmonary Fibrosis ( Feb. 9, 2016 )   Coronary atherosclerosis    Dysrhythmia    A-Fib   Hematuria, gross    HISTORY OF  ?  2004   Hypertension    dr Daneen Schick   Immune disorder [facility])    Myocardial infarction ([place]) 2000   Pulmonary fibrosis ([place])    Rheumatoid arthritis ([place])    Shortness of breath    with exertion   Staph skin infection    Back   Social History   Socioeconomic History   Marital status: Married    Spouse name: Not on file   Number of children: Not on file   Years of education: Not on file   Highest education level: Not on file  Occupational History   Not on file  Tobacco Use   Smoking status: Former    Packs/day: 3.00    Years: 50.00    Pack years: 150.00    Types: Cigarettes    Quit date: 03/27/1978    Years since quitting: 43.1   Smokeless tobacco: Former    Types: Chew  Substance and Sexual Activity   Alcohol use: No    Alcohol/week: 0.0 standard drinks   Drug use: No   Sexual activity: Not on file  Other Topics Concern   Not on file  Social History Narrative   Not on file   Social Determinants of Health   Financial Resource Strain: Not on file  Food Insecurity: Not on file  Transportation Needs: Not on file  Physical Activity: Not on file  Stress: Not on file  Social Connections: Not on file   Family History  Problem Relation Age of Onset   Heart disease Brother        Heart Disease before age 25   Stroke Brother    Heart attack Brother    Hypertension Brother    Diabetes Son    Heart disease Son        Heart Disease before age 24   Hypertension Son    Heart attack Son    Bleeding Disorder Father    COPD Father        Emphysema   Varicose Veins Sister    COPD Sister    Scheduled Meds:  ascorbic acid  500 mg Oral Daily   aspirin  81 mg Oral Daily   atorvastatin  40 mg Oral Daily   calcium carbonate  1,250 mg Oral Q breakfast   Chlorhexidine Gluconate Cloth  6 each Topical Daily    DULoxetine  30 mg Oral Daily   ferrous sulfate  325 mg Oral Q breakfast   heparin  5,000 Units  Subcutaneous Q8H   lactobacillus  1 g Oral TID WC   methylPREDNISolone (SOLU-MEDROL) injection  20 mg Intravenous Q24H   metoprolol tartrate  12.5 mg Oral BID   metroNIDAZOLE  500 mg Oral Q12H   multivitamin with minerals  1 tablet Oral Daily   sodium chloride flush  3 mL Intravenous Q12H   sodium chloride flush  3 mL Intravenous Q12H   sulfaSALAzine  1,000 mg Oral TID   tamsulosin  0.4 mg Oral Daily   [START ON 05/08/2021] Vitamin D (Ergocalciferol)  50,000 Units Oral Q Sun   Continuous Infusions:  sodium chloride     ceFEPime (MAXIPIME) IV 2 g (05/02/21 1121)   PRN Meds:.sodium chloride, acetaminophen **OR** acetaminophen, bisacodyl, fentaNYL (SUBLIMAZE) injection, haloperidol lactate, hydrALAZINE, HYDROmorphone (DILAUDID) injection, ipratropium, levalbuterol, LORazepam, ondansetron **OR** ondansetron (ZOFRAN) IV, polyvinyl alcohol, senna-docusate, sodium chloride flush, traZODone Allergies  Allergen Reactions   Morphine Other (See Comments)    "makes me crazy"   Morphine And Related Nausea And Vomiting and Other (See Comments)    Hallucinations    Nirmatrelvir-Ritonavir Other (See Comments)    Other reaction(s): confusion    Review of Systems  Unable to perform ROS: Dementia   Physical Exam Vitals and nursing note reviewed.  Constitutional:      General: He is not in acute distress.    Appearance: He is ill-appearing.  Cardiovascular:     Rate and Rhythm: Normal rate.  Pulmonary:     Effort: No tachypnea, accessory muscle usage or respiratory distress.  Abdominal:     General: Abdomen is flat.  Neurological:     Mental Status: He is alert.     Comments: Disoriented to place and situation; oriented to person and knows family at bedside. Able to express his needs.     Vital Signs: BP 140/61    Pulse 94    Temp 98.5 F (36.9 C) (Oral)    Resp (!) 0    Ht 6' (1.829 m)    Wt  73.5 kg    SpO2 100%    BMI 21.98 kg/m  Pain Scale: 0-10   Pain Score: 4    SpO2: SpO2: 100 % O2 Device:SpO2: 100 % O2 Flow Rate: .O2 Flow Rate (L/min): 2 L/min  IO: Intake/output summary:  Intake/Output Summary (Last 24 hours) at 05/02/2021 1253 Last data filed at 05/02/2021 1121 Gross per 24 hour  Intake 2055.3 ml  Output 475 ml  Net 1580.3 ml    LBM:   Baseline Weight: Weight: 73 kg Most recent weight: Weight: 73.5 kg     Palliative Assessment/Data:     Time Total: 55 min  Greater than 50%  of this time was spent counseling and coordinating care related to the above assessment and plan.  Signed by: Vinie Sill, NP Palliative Medicine Team Pager # [phone] (M-F 8a-5p) Team Phone # [phone] (Nights/Weekends)

## 2021-05-02 NOTE — TOC Initial Note (Signed)
Transition of Care [facility]) - Initial/Assessment Note    Patient Details  Name: [name] MRN: [id number] Date of Birth: 28-Jan-1931  Transition of Care [facility]) CM/SW Contact:    Salome Arnt, [place] Phone Number: 05/02/2021, 3:23 PM  Clinical Narrative:  Pt admitted from [facility] due to sepsis. LCSW spoke with pt's wife to complete assessment. She reports pt was at [facility] for a few days prior to this admission. She requests return when medically stable. Per Marianna Fuss at [facility], okay to return. TOC will start authorization.                    Expected Discharge Plan: Skilled Nursing Facility Barriers to Discharge: Continued Medical Work up   Patient Goals and CMS Choice Patient states their goals for this hospitalization and ongoing recovery are:: return to SNF   Choice offered to / list presented to : Spouse  Expected Discharge Plan and Services Expected Discharge Plan: Skilled Nursing Facility In-house Referral: Clinical Social Work   Post Acute Care Choice: Resumption of Svcs/PTA Provider Living arrangements for the past 2 months: [place]                                      Prior Living Arrangements/Services Living arrangements for the past 2 months: [place] Lives with:: Facility Resident Patient language and need for interpreter reviewed:: Yes Do you feel safe going back to the place where you live?: Yes      Need for Family Participation in Patient Care: Yes (Comment) Care giver support system in place?: Yes (comment)   Criminal Activity/Legal Involvement Pertinent to Current Situation/Hospitalization: No - Comment as needed  Activities of Daily Living Home Assistive Devices/Equipment: Cane (specify quad or straight), Oxygen, Shower chair without back ADL Screening (condition at time of admission) Patient's cognitive ability adequate to safely complete daily activities?: No Is the patient deaf or have difficulty hearing?:  No Does the patient have difficulty seeing, even when wearing glasses/contacts?: No Does the patient have difficulty concentrating, remembering, or making decisions?: Yes Patient able to express need for assistance with ADLs?: Yes Does the patient have difficulty dressing or bathing?: Yes Independently performs ADLs?: No Communication: Independent Dressing (OT): Dependent Is this a change from baseline?: Pre-admission baseline Grooming: Needs assistance Is this a change from baseline?: Pre-admission baseline Feeding: Needs assistance Is this a change from baseline?: Pre-admission baseline Bathing: Dependent Is this a change from baseline?: Pre-admission baseline Toileting: Dependent Is this a change from baseline?: Pre-admission baseline In/Out Bed: Dependent Is this a change from baseline?: Pre-admission baseline Walks in Home: Dependent Is this a change from baseline?: Pre-admission baseline Does the patient have difficulty walking or climbing stairs?: Yes Weakness of Legs: Both Weakness of Arms/Hands: None  Permission Sought/Granted   Permission granted to share information with : Yes, Verbal Permission Granted     Permission granted to share info w AGENCY: [facility]  Permission granted to share info w Relationship: SNF     Emotional Assessment   Attitude/Demeanor/Rapport: Unable to Assess   Orientation: : Oriented to Self Alcohol / Substance Use: Not Applicable Psych Involvement: No (comment)  Admission diagnosis:  SIRS (systemic inflammatory response syndrome) ([place]) [R65.10] Patient Active Problem List   Diagnosis Date Noted   Iron deficiency anemia due to chronic blood loss 05/01/2021   Sepsis ([place]) 05/01/2021   Unspecified protein-calorie malnutrition ([place]) 04/30/2021  Closed left hip fracture ([place]) 04/29/2021   Rheumatoid arteritis ([place]) 04/29/2021   Chronic respiratory failure with hypoxia (HCC) 04/29/2021   Chronic diastolic (congestive) heart failure ([place])  04/29/2021   Vascular dementia without behavioral disturbance ([place]) 04/29/2021   Hip dislocation, right ([place]) 06/30/2020   Asymptomatic COVID-19 virus infection 06/30/2020   Abdominal aortic aneurysm without rupture 01/06/2016   Essential hypertension 11/04/2014   Pulmonary fibrosis assoc with RA  04/07/2014   CAD (coronary artery disease), native coronary artery 02/26/2014   Hyperlipidemia 02/26/2014   Paroxysmal atrial fibrillation ([place]) 02/26/2014   Arthritis of knee, right 01/20/2013   PCP:  Lajean Manes, MD Pharmacy:   [facility] [place] - [address] [place] [address] Phone: [phone] Fax: [phone]  [facility] ([place], [place] [address] Phone: [phone] Fax: [phone]  [id number] - [place], [address] [place] [address] Phone: [phone] Fax: [phone]     Social Determinants of Health (SDOH) Interventions    Readmission Risk Interventions Readmission Risk Prevention Plan 05/02/2021  Transportation Screening Complete  Medication Review (RN Care Manager) Complete  HRI or Home Care Consult Complete  SW Recovery Care/Counseling Consult Complete  Palliative Care Screening Not Applicable  Skilled Nursing Facility Complete  Some recent data might be hidden

## 2021-05-02 NOTE — NC FL2 (Signed)
[place] MEDICAID FL2 LEVEL OF CARE SCREENING TOOL     IDENTIFICATION  Patient Name: [name] Birthdate: 01-15-1931 Sex: male Admission Date (Current Location): 05/01/2021  [facility] and [place] Number:  [employer] and Address:  [facility] [address] [place]      Provider Number: [id number]  Attending Physician Name and Address:  Deatra James, MD  Relative Name and Phone Number:       Current Level of Care: Hospital Recommended Level of Care: [place] Prior Approval Number:    Date Approved/Denied:   PASRR Number:    Discharge Plan: SNF    Current Diagnoses: Patient Active Problem List   Diagnosis Date Noted   Iron deficiency anemia due to chronic blood loss 05/01/2021   Sepsis ([place]) 05/01/2021   Unspecified protein-calorie malnutrition ([place]) 04/30/2021   Closed left hip fracture ([place]) 04/29/2021   Rheumatoid arteritis ([place]) 04/29/2021   Chronic respiratory failure with hypoxia ([place]) 04/29/2021   Chronic diastolic (congestive) heart failure ([place]) 04/29/2021   Vascular dementia without behavioral disturbance ([place]) 04/29/2021   Hip dislocation, right ([place]) 06/30/2020   Asymptomatic COVID-19 virus infection 06/30/2020   Abdominal aortic aneurysm without rupture 01/06/2016   Essential hypertension 11/04/2014   Pulmonary fibrosis assoc with RA  04/07/2014   CAD (coronary artery disease), native coronary artery 02/26/2014   Hyperlipidemia 02/26/2014   Paroxysmal atrial fibrillation ([place]) 02/26/2014   Arthritis of knee, right 01/20/2013    Orientation RESPIRATION BLADDER Height & Weight     Self  O2 (2L) External catheter Weight: 162 lb 0.6 oz (73.5 kg) Height:  6' (182.9 cm)  BEHAVIORAL SYMPTOMS/MOOD NEUROLOGICAL BOWEL NUTRITION STATUS      Incontinent Diet (Regular. See d/c summary for updates.)  AMBULATORY STATUS COMMUNICATION OF NEEDS Skin   Extensive Assist Verbally Skin abrasions,  Bruising                       Personal Care Assistance Level of Assistance  Bathing, Feeding, Dressing Bathing Assistance: Maximum assistance Feeding assistance: Limited assistance Dressing Assistance: Maximum assistance     Functional Limitations Info  Sight, Hearing, Speech Sight Info: Impaired Hearing Info: Adequate Speech Info: Adequate    SPECIAL CARE FACTORS FREQUENCY  PT (By licensed PT)     PT Frequency: 5x weekly              Contractures      Additional Factors Info  Code Status, Allergies, Psychotropic Code Status Info: DNR Allergies Info: Morphine, Morphine and Related, Nirmatrelvir-ritonavir Psychotropic Info: Cymbalta         Current Medications (05/02/2021):  This is the current hospital active medication list Current Facility-Administered Medications  Medication Dose Route Frequency Provider Last Rate Last Admin   0.9 %  sodium chloride infusion  250 mL Intravenous PRN Shahmehdi, Seyed A, MD       acetaminophen (TYLENOL) tablet 650 mg  650 mg Oral Q6H PRN Shahmehdi, Seyed A, MD       Or   acetaminophen (TYLENOL) suppository 650 mg  650 mg Rectal Q6H PRN Shahmehdi, Seyed A, MD       ascorbic acid (VITAMIN C) tablet 500 mg  500 mg Oral Daily Shahmehdi, Seyed A, MD   500 mg at 05/02/21 3154   aspirin chewable tablet 81 mg  81 mg Oral Daily Shahmehdi, Seyed A, MD   81 mg at 05/02/21 0832   atorvastatin (LIPITOR) tablet 40  mg  40 mg Oral Daily Shahmehdi, Seyed A, MD   40 mg at 05/02/21 0962   bisacodyl (DULCOLAX) EC tablet 5 mg  5 mg Oral Daily PRN Skipper Cliche A, MD       calcium carbonate (OS-CAL - dosed in mg of elemental calcium) tablet 1,250 mg  1,250 mg Oral Q breakfast Shahmehdi, Seyed A, MD   1,250 mg at 05/02/21 8366   ceFEPIme (MAXIPIME) 2 g in sodium chloride 0.9 % 100 mL IVPB  2 g Intravenous Q8H Shahmehdi, Seyed A, MD   Stopped at 05/02/21 1151   Chlorhexidine Gluconate Cloth 2 % PADS 6 each  6 each Topical Daily Skipper Cliche A,  MD   6 each at 05/02/21 0835   DULoxetine (CYMBALTA) DR capsule 30 mg  30 mg Oral Daily Shahmehdi, Seyed A, MD   30 mg at 05/02/21 0833   fentaNYL (SUBLIMAZE) injection 50 mcg  50 mcg Intravenous Q1H PRN Godfrey Pick, MD   50 mcg at 05/01/21 1355   ferrous sulfate tablet 325 mg  325 mg Oral Q breakfast Skipper Cliche A, MD   325 mg at 05/02/21 2947   haloperidol lactate (HALDOL) injection 2 mg  2 mg Intravenous Q6H PRN Skipper Cliche A, MD   2 mg at 05/02/21 0114   heparin injection 5,000 Units  5,000 Units Subcutaneous Q8H Shahmehdi, Erling Conte A, MD   5,000 Units at 05/02/21 1318   hydrALAZINE (APRESOLINE) injection 10 mg  10 mg Intravenous Q4H PRN Shahmehdi, Seyed A, MD       HYDROmorphone (DILAUDID) injection 0.5-1 mg  0.5-1 mg Intravenous Q2H PRN Skipper Cliche A, MD   1 mg at 05/02/21 0836   ipratropium (ATROVENT) nebulizer solution 0.5 mg  0.5 mg Nebulization Q6H PRN Shahmehdi, Seyed A, MD       lactobacillus (FLORANEX/LACTINEX) granules 1 g  1 g Oral TID WC Shahmehdi, Seyed A, MD       levalbuterol (XOPENEX) nebulizer solution 0.63 mg  0.63 mg Nebulization Q6H PRN Shahmehdi, Seyed A, MD       LORazepam (ATIVAN) injection 0.5 mg  0.5 mg Intravenous Q8H PRN Shahmehdi, Seyed A, MD       methylPREDNISolone sodium succinate (SOLU-MEDROL) 40 mg/mL injection 20 mg  20 mg Intravenous Q24H Shahmehdi, Seyed A, MD   20 mg at 05/02/21 1528   metoprolol tartrate (LOPRESSOR) tablet 12.5 mg  12.5 mg Oral BID Skipper Cliche A, MD   12.5 mg at 05/02/21 0833   metroNIDAZOLE (FLAGYL) tablet 500 mg  500 mg Oral Q12H [name], Lavell Islam, MD   500 mg at 05/02/21 1316   multivitamin with minerals tablet 1 tablet  1 tablet Oral Daily Skipper Cliche A, MD   1 tablet at 05/02/21 0832   ondansetron (ZOFRAN) tablet 4 mg  4 mg Oral Q6H PRN Shahmehdi, Valeria Batman, MD       Or   ondansetron (ZOFRAN) injection 4 mg  4 mg Intravenous Q6H PRN Shahmehdi, Seyed A, MD       polyvinyl alcohol (LIQUIFILM TEARS) 1.4 %  ophthalmic solution 1 drop  1 drop Both Eyes TID PRN Shahmehdi, Seyed A, MD       senna-docusate (Senokot-S) tablet 1 tablet  1 tablet Oral QHS PRN Shahmehdi, Seyed A, MD       sodium chloride flush (NS) 0.9 % injection 3 mL  3 mL Intravenous Q12H Shahmehdi, Seyed A, MD       sodium chloride flush (NS) 0.9 %  injection 3 mL  3 mL Intravenous Q12H Shahmehdi, Seyed A, MD   3 mL at 05/02/21 0835   sodium chloride flush (NS) 0.9 % injection 3 mL  3 mL Intravenous PRN Shahmehdi, Seyed A, MD       sulfaSALAzine (AZULFIDINE) tablet 1,000 mg  1,000 mg Oral TID Skipper Cliche A, MD   1,000 mg at 05/02/21 1527   tamsulosin (FLOMAX) capsule 0.4 mg  0.4 mg Oral Daily Shahmehdi, Seyed A, MD   0.4 mg at 05/02/21 7001   traZODone (DESYREL) tablet 50 mg  50 mg Oral QHS PRN Deatra James, MD       [START ON 05/08/2021] Vitamin D (Ergocalciferol) (DRISDOL) capsule 50,000 Units  50,000 Units Oral Q Asa Saunas, Valeria Batman, MD         Discharge Medications: Please see discharge summary for a list of discharge medications.  Relevant Imaging Results:  Relevant Lab Results:   Additional Information    Salome Arnt, LCSW

## 2021-05-02 NOTE — Evaluation (Signed)
Physical Therapy Evaluation Patient Details Name: [name] MRN: [id number] DOB: 08-08-1930 Today's Date: 05/02/2021  History of Present Illness  [name] is a 88 year old male chronic respiratory failure COPD/pulmonary fibrosis 2 L dependent, CAD, MI, HTN, HLD, RA,AAA, dementia,      Presenting from [facility], SN[facility] concern for fever.  Patient was recently hospitalized at [facility] from 1 32-2 after mechanical fall sustaining hip fracture.  It was determined that his proximal femur fracture was not amendable to ORIF therefore was placed in a hip brace on the right side.  And was sent for rehab.  Patient has not been anticoagulated due to dementia and falls.  He has chronic respiratory failure due to pulmonary fibrosis on 2 L of supplemental oxygen continuously.  He has been empirically on cefdinir and doxycycline for empiric treatment of possible pneumonia at the facility.   Clinical Impression  Patient presents wearing RLE abduction brace, demonstrates slow labored movement for sitting up at bedside requiring Mod assist to move BLE and fair carryover scooting to EOB.  Patient unsteady on feet and limited to a few side steps, steps forward/backwards with fair return for 50% PWB on LLE due to fatigue and generalized weakness.  Patient tolerated sitting up in chair after therapy with his spouse present in room - nursing staff notified.  Patient will benefit from continued skilled physical therapy in hospital and recommended venue below to increase strength, balance, endurance for safe ADLs and gait.          Recommendations for follow up therapy are one component of a multi-disciplinary discharge planning process, led by the attending physician.  Recommendations may be updated based on patient status, additional functional criteria and insurance authorization.  Follow Up Recommendations Skilled nursing-short term rehab (<3 hours/day)    Assistance Recommended at Discharge  Intermittent Supervision/Assistance  Patient can return home with the following  A lot of help with bathing/dressing/bathroom;A lot of help with walking and/or transfers;Help with stairs or ramp for entrance;Assistance with cooking/housework    Equipment Recommendations None recommended by PT  Recommendations for Other Services       Functional Status Assessment Patient has had a recent decline in their functional status and demonstrates the ability to make significant improvements in function in a reasonable and predictable amount of time.     Precautions / Restrictions Precautions Precautions: Fall Required Braces or Orthoses: Other Brace Other Brace: Abduction brace right hip Restrictions Weight Bearing Restrictions: Yes LLE Weight Bearing: Partial weight bearing LLE Partial Weight Bearing Percentage or Pounds: 50%      Mobility  Bed Mobility Overal bed mobility: Needs Assistance Bed Mobility: Supine to Sit     Supine to sit: Mod assist     General bed mobility comments: increased time, labored movement, Mod assist to move BLE    Transfers Overall transfer level: Needs assistance Equipment used: Rolling walker (2 wheels) Transfers: Sit to/from Stand, Bed to chair/wheelchair/BSC Sit to Stand: Mod assist   Step pivot transfers: Mod assist       General transfer comment: slow labored movement with mild increase in BLE pain    Ambulation/Gait Ambulation/Gait assistance: Mod assist Gait Distance (Feet): 8 Feet Assistive device: Rolling walker (2 wheels) Gait Pattern/deviations: Decreased step length - right, Decreased step length - left, Decreased stride length Gait velocity: decreased     General Gait Details: limited to a few slow labored unsteady side steps, steps forward/backwards before having to sit due to c/o fatigue while  on 2 LPM O2, SpO2 at 99%  Stairs            Wheelchair Mobility    Modified Rankin (Stroke Patients Only)        Balance Overall balance assessment: Needs assistance Sitting-balance support: Feet supported, No upper extremity supported Sitting balance-Leahy Scale: Fair Sitting balance - Comments: fair/good seated at EOB   Standing balance support: Reliant on assistive device for balance, During functional activity, Bilateral upper extremity supported Standing balance-Leahy Scale: Poor Standing balance comment: fair/poor using RW                             Pertinent Vitals/Pain Pain Assessment Pain Assessment: Faces Faces Pain Scale: Hurts a little bit Pain Location: BLE Pain Descriptors / Indicators: Sore Pain Intervention(s): Limited activity within patient's tolerance, Monitored during session, Repositioned    Home Living Family/patient expects to be discharged to:: Private residence Living Arrangements: Spouse/significant other Available Help at Discharge: Family;Available 24 hours/day Type of Home: House Home Access: Stairs to enter Entrance Stairs-Rails: Left Entrance Stairs-Number of Steps: 3   Home Layout: One level Home Equipment: Conservation officer, nature (2 wheels);Wheelchair - [employer] - single point      Prior Function Prior Level of Function : Independent/Modified Independent             Mobility Comments: Short distanced community ambulator using SPC ADLs Comments: assisted by family     Hand Dominance   Dominant Hand: Right    Extremity/Trunk Assessment   Upper Extremity Assessment Upper Extremity Assessment: Overall WFL for tasks assessed    Lower Extremity Assessment Lower Extremity Assessment: Generalized weakness    Cervical / Trunk Assessment Cervical / Trunk Assessment: Normal  Communication   Communication: No difficulties  Cognition Arousal/Alertness: Awake/alert Behavior During Therapy: WFL for tasks assessed/performed Overall Cognitive Status: History of cognitive impairments - at baseline                                           General Comments      Exercises     Assessment/Plan    PT Assessment Patient needs continued PT services  PT Problem List Decreased strength;Decreased activity tolerance;Decreased balance;Decreased mobility       PT Treatment Interventions DME instruction;Gait training;Stair training;Functional mobility training;Therapeutic exercise;Therapeutic activities;Balance training;Patient/family education    PT Goals (Current goals can be found in the Care Plan section)  Acute Rehab PT Goals Patient Stated Goal: return home after rehab PT Goal Formulation: With patient/family Time For Goal Achievement: 05/16/21 Potential to Achieve Goals: Good    Frequency Min 3X/week     Co-evaluation               AM-PAC PT "6 Clicks" Mobility  Outcome Measure Help needed turning from your back to your side while in a flat bed without using bedrails?: A Lot Help needed moving from lying on your back to sitting on the side of a flat bed without using bedrails?: A Lot Help needed moving to and from a bed to a chair (including a wheelchair)?: A Lot Help needed standing up from a chair using your arms (e.g., wheelchair or bedside chair)?: A Lot Help needed to walk in hospital room?: A Lot Help needed climbing 3-5 steps with a railing? : Total 6 Click Score: 11    End of  Session Equipment Utilized During Treatment: Oxygen Activity Tolerance: Patient tolerated treatment well;Patient limited by fatigue Patient left: in chair;with call bell/phone within reach;with family/visitor present Nurse Communication: Mobility status PT Visit Diagnosis: Unsteadiness on feet (R26.81);Other abnormalities of gait and mobility (R26.89);Muscle weakness (generalized) (M62.81)    Time: 5997-7414 PT Time Calculation (min) (ACUTE ONLY): 30 min   Charges:   PT Evaluation $PT Eval Moderate Complexity: 1 Mod PT Treatments $Therapeutic Activity: 23-37 mins        3:16 PM, 05/02/21 Lonell Grandchild, MPT Physical Therapist with [facility] 336 [phone] office [phone] mobile phone

## 2021-05-02 NOTE — Progress Notes (Signed)
PROGRESS NOTE    Patient: Unknown Jim                            PCP: Lajean Manes, MD                    DOB: [birth year]            DOA: 05/01/2021 BTD:[id number]             DOS: 05/02/2021, 12:41 PM   LOS: 1 day   Date of Service: The patient was seen and examined on 05/02/2021  Subjective:   The patient was seen and examined this morning. Hemodynamically stable pleasantly confused in no acute distress Afebrile normotensive No complaints Limited range of motion at lower extremities  Wife concerned of dislocating his prosthetic hip again  Brief Narrative:   BRANDEN SHALLENBERGER is a 88 year old male chronic respiratory failure COPD/pulmonary fibrosis 2 L dependent, CAD, MI, HTN, HLD, RA,AAA, dementia,   Presenting from [facility], SNF concern for fever.  Patient was recently hospitalized at [facility] from 1 32-2 after mechanical fall sustaining hip fracture. It was determined that his proximal femur fracture was not amendable to ORIF therefore was placed in a hip brace on the right side.  And was sent for rehab. Patient has not been anticoagulated due to dementia and falls.  He has chronic respiratory failure due to pulmonary fibrosis on 2 L of supplemental oxygen continuously. He has been empirically on cefdinir and doxycycline for empiric treatment of possible pneumonia at the facility.  ED: Upon arrival Temp: 100.7, RR 28 BP 96/54, WBC of 25.7 UA amber, trace of hemoglobin, protein, few bacteria WBC CTA: Negative for any PE chronic residual lung disease, pulmonary fibrosis, interstitial lung disease, pneumonia could not do review abnormal esophageal findings seems to be chronic with hiatal hernia, mediastinal lymphadenopathy mild, possible mild pulmonary hypertension, aortic and coronary atherosclerotic disease   -Cultures were obtained, patient was started on broad-spectrum antibiotics of cefepime and vancomycin    Assessment & Plan:   Principal Problem:   Sepsis  ([place]) Active Problems:   Pulmonary fibrosis assoc with RA    CAD (coronary artery disease), native coronary artery   Paroxysmal atrial fibrillation (HCC)   Hyperlipidemia   Abdominal aortic aneurysm without rupture   Essential hypertension   Closed left hip fracture (HCC)   Rheumatoid arteritis (HCC)   Chronic respiratory failure with hypoxia (HCC)   Chronic diastolic (congestive) heart failure (HCC)   Vascular dementia without behavioral disturbance (HCC)   Iron deficiency anemia due to chronic blood loss     Assessment and Plan: * Sepsis ([place])- (present on admission) -Sepsis -unknown etiology likely UTI versus pneumonia  -On arrival met sepsis criteria: TM 100.3, PR 103,, RR 25, BP 95/57, -WBC 25.7  - CT angiogram reviewed-chronic pulmonary fibrosis pneumonia could not be ruled out-diffuse esophageal changes possible strictures-chronic  -Urine cloudy trace of hemoglobin few bacteria, WBC 6-10  -Blood and urine culture has been obtained in ED  -We will follow-up with blood/urine cultures  -Patient presenting from [facility] -Patient has been empirically antibiotics at the SNF doxycycline, Omnicef,  -Gentle IV fluid hydration has been initiated due to chronic pulmonary fibrosis  -Broad-spectrum IV antibiotic initiated in ED cefepime and vancomycin  SIRS (systemic inflammatory response syndrome) (HCC)-resolved as of 05/01/2021, (present on admission) -Sepsis -unknown etiology likely UTI versus pneumonia  -On arrival met sepsis criteria:  TM 100.3, PR 103,, RR 25, BP 95/57, -WBC 25.7  - CT angiogram reviewed-chronic pulmonary fibrosis pneumonia could not be ruled out-diffuse esophageal changes possible strictures-chronic  -Urine cloudy trace of hemoglobin few bacteria, WBC 6-10  -Blood and urine culture has been obtained in ED  -We will follow-up with blood/urine cultures  -Patient presenting from [facility] SNF -Patient has been empirically antibiotics at the  SNF doxycycline, Omnicef,  -Gentle IV fluid hydration has been initiated due to chronic pulmonary fibrosis  -Broad-spectrum IV antibiotic initiated in ED cefepime and vancomycin  Pulmonary fibrosis assoc with RA - (present on admission) - Extensive history of pulmonary fibrosis -CT angiogram reviewed revealed consistent findings With chronic interstitial lung disease -We will continue supportive therapy, supplement oxygen, DuoNeb bronchodilator, mucolytic's, -With holding steroids at this time -Consulted pulmonologist for further input and assist  CAD (coronary artery disease), native coronary artery- (present on admission) - Stable continue home medication of aspirin, Lipitor, metoprolol  Paroxysmal atrial fibrillation (HCC)- (present on admission) - Currently not on any chronic anticoagulation therapy with exception of aspirin  (Patient has a high [place] Vascor, exclusively with hip fracture high risk for DVT also may benefit from anticoagulation-with chronic medical condition debility, and dementia might be a very) -On metoprolol, for rate control,  Hyperlipidemia- (present on admission) - Continue statin-atorvastatin  Abdominal aortic aneurysm without rupture- (present on admission) - Stable -We will continue current medication, close follow-up with outpatient  Essential hypertension- (present on admission) - Remained stable, continue current medication including metoprolol -As needed hydralazine  Closed left hip fracture ([place])- (present on admission) - Apparently patient had a spontaneous left hip fracture, was not a candidate for ORIF due to chronic advanced lung disease, pulmonary fibrosis -Currently in a brace, and SNF, pursuing PT, and hopeful for resolution, spontaneous healing  Rheumatoid arteritis ([place])- (present on admission) - Stable as needed analgesics, -It appears the patient is on hydroxychloroquine  Chronic respiratory failure with hypoxia ([place])- (present on  admission) - Continue supportive therapy, supplemental oxygen -As needed DuoNeb bronchodilators -Chronically on steroids currently on 5 mg of prednisone daily  Chronic diastolic (congestive) heart failure (HCC)- (present on admission) - Gentle IV fluid hydration at this time as patient meets SIRS, ruling out sepsis -otherwise we will monitor daily weight, I's and O's -At this point seem to be compensated -Not on any chronic diuretics   Last Echo reviewed from 05/12/2020: EJf: 45 to 50%. The left ventricle has mildly decreased function. The left ventricle demonstrates global hypokinesis. There is mild concentric left ventricular hypertrophy. Left ventricular diastolic  parameters are consistent with Grade I diastolic dysfunction (impaired relaxation   Vascular dementia without behavioral disturbance ([place])- (present on admission) - Currently stable -Continue supportive therapy, reorientation -Continue home Cymbalta,   Iron deficiency anemia due to chronic blood loss - We will continue iron supplements   Cultures; Blood Cultures x 2 >> NGT Urine Culture  >>> NGT       Consults: Telemetry infectious disease Dr. Drucilla Schmidt from [place], Palliative care consult    ------------------------------------------------------------------------------------------------------------------------------------------------  DVT prophylaxis:  heparin injection 5,000 Units Start: 05/01/21 2200 TED hose Start: 05/01/21 1432 SCDs Start: 05/01/21 1432   Code Status:   Code Status: DNR  Family Communication:  Discussed extensively with patient's son and wife at the bedside  The above findings and plan of care has been discussed with patient (and family)  in detail,  they expressed understanding and agreement of above. -Advance care planning has been discussed.  Admission status:   Status is: Inpatient Remains inpatient appropriate because: Needing aggressive treatment for sepsis, IV fluid, IV  antibiotics             Procedures:   No admission procedures for hospital encounter.   Antimicrobials:  Anti-infectives (From admission, onward)    Start     Dose/Rate Route Frequency Ordered Stop   05/02/21 1400  vancomycin (VANCOREADY) IVPB 1250 mg/250 mL  Status:  Discontinued       See Hyperspace for full Linked Orders Report.   1,250 mg 166.7 mL/hr over 90 Minutes Intravenous Every 24 hours 05/01/21 1243 05/02/21 0935   05/02/21 1330  metroNIDAZOLE (FLAGYL) tablet 500 mg        500 mg Oral Every 12 hours 05/02/21 1231     05/02/21 0800  ceFEPIme (MAXIPIME) 2 g in sodium chloride 0.9 % 100 mL IVPB        2 g 200 mL/hr over 30 Minutes Intravenous Every 8 hours 05/02/21 0757     05/01/21 2200  ceFEPIme (MAXIPIME) 2 g in sodium chloride 0.9 % 100 mL IVPB  Status:  Discontinued        2 g 200 mL/hr over 30 Minutes Intravenous Every 12 hours 05/01/21 1245 05/02/21 0757   05/01/21 2200  doxycycline (VIBRA-TABS) tablet 100 mg  Status:  Discontinued        100 mg Oral 2 times daily 05/01/21 1443 05/02/21 1231   05/01/21 1400  vancomycin (VANCOREADY) IVPB 1500 mg/300 mL       See Hyperspace for full Linked Orders Report.   1,500 mg 150 mL/hr over 120 Minutes Intravenous  Once 05/01/21 1243 05/01/21 1653   05/01/21 1245  ceFEPIme (MAXIPIME) 2 g in sodium chloride 0.9 % 100 mL IVPB        2 g 200 mL/hr over 30 Minutes Intravenous  Once 05/01/21 1233 05/01/21 1337   05/01/21 1245  metroNIDAZOLE (FLAGYL) IVPB 500 mg        500 mg 100 mL/hr over 60 Minutes Intravenous  Once 05/01/21 1233 05/01/21 1406   05/01/21 1245  vancomycin (VANCOCIN) IVPB 1000 mg/200 mL premix  Status:  Discontinued        1,000 mg 200 mL/hr over 60 Minutes Intravenous  Once 05/01/21 1233 05/01/21 1243        Medication:   ascorbic acid  500 mg Oral Daily   aspirin  81 mg Oral Daily   atorvastatin  40 mg Oral Daily   calcium carbonate  1,250 mg Oral Q breakfast   Chlorhexidine Gluconate Cloth   6 each Topical Daily   DULoxetine  30 mg Oral Daily   ferrous sulfate  325 mg Oral Q breakfast   heparin  5,000 Units Subcutaneous Q8H   lactobacillus  1 g Oral TID WC   methylPREDNISolone (SOLU-MEDROL) injection  20 mg Intravenous Q24H   metoprolol tartrate  12.5 mg Oral BID   metroNIDAZOLE  500 mg Oral Q12H   multivitamin with minerals  1 tablet Oral Daily   sodium chloride flush  3 mL Intravenous Q12H   sodium chloride flush  3 mL Intravenous Q12H   sulfaSALAzine  1,000 mg Oral TID   tamsulosin  0.4 mg Oral Daily   [START ON 05/08/2021] Vitamin D (Ergocalciferol)  50,000 Units Oral Q Sun    sodium chloride, acetaminophen **OR** acetaminophen, bisacodyl, fentaNYL (SUBLIMAZE) injection, haloperidol lactate, hydrALAZINE, HYDROmorphone (DILAUDID) injection, ipratropium, levalbuterol, LORazepam, ondansetron **OR** ondansetron (ZOFRAN) IV, polyvinyl alcohol, senna-docusate, sodium  chloride flush, traZODone   Objective:   Vitals:   05/02/21 1000 05/02/21 1100 05/02/21 1124 05/02/21 1200  BP: 122/69 113/67  140/61  Pulse: 73 78 81 94  Resp: (!) 0 (!) 0 (!) 0 (!) 0  Temp:   98.5 F (36.9 C)   TempSrc:   Oral   SpO2: 100% 99% 95% 100%  Weight:      Height:        Intake/Output Summary (Last 24 hours) at 05/02/2021 1241 Last data filed at 05/02/2021 1121 Gross per 24 hour  Intake 2055.3 ml  Output 475 ml  Net 1580.3 ml   Filed Weights   05/01/21 1058 05/02/21 0123  Weight: 73 kg 73.5 kg     Examination:   Physical Exam  Constitution: Awake alert, pleasantly confused at baseline Psychiatric:   Normal mood, no signs of agitation, pleasantly confused HEENT:        Normocephalic, PERRL, otherwise with in Normal limits  Chest:         Chest symmetric Cardio vascular:  S1/S2, RRR, No murmure, No Rubs or Gallops  pulmonary: Clear to auscultation bilaterally, respirations unlabored, negative wheezes / crackles Abdomen: Soft, non-tender, non-distended, bowel sounds,no masses, no  organomegaly Muscular skeletal: Hip fracture, in the brace, nonambulatory, moving his toes able to bend his knees  Limited exam - in bed, able to move all 4 extremities,   Neuro: Limited exam - CNII-XII intact. ,  Limited motor function bilateral lower extremity, intact sensation, reflexes intact  Extremities: No pitting edema lower extremities, +2 pulses -pelvic/hip brace in place Skin: Dry, warm to touch, negative for any Rashes, No open wounds Wounds: per nursing documentation   ------------------------------------------------------------------------------------------------------------------------------------------    LABs:  CBC Latest Ref Rng & Units 05/02/2021 05/01/2021 07/01/2020  WBC 4.0 - 10.5 K/uL 16.3(H) 25.7(H) 9.1  Hemoglobin 13.0 - 17.0 g/dL 8.1(L) 9.9(L) 12.1(L)  Hematocrit 39.0 - 52.0 % 25.0(L) 30.2(L) 37.6(L)  Platelets 150 - 400 K/uL 249 299 200   CMP Latest Ref Rng & Units 05/02/2021 05/01/2021 07/01/2020  Glucose 70 - 99 mg/dL 102(H) 116(H) 92  BUN 8 - 23 mg/dL 31(H) 36(H) 16  Creatinine 0.61 - 1.24 mg/dL 0.69 0.93 0.64  Sodium 135 - 145 mmol/L 139 138 141  Potassium 3.5 - 5.1 mmol/L 4.3 4.2 3.8  Chloride 98 - 111 mmol/L 110 103 107  CO2 22 - 32 mmol/L 21(L) 23 24  Calcium 8.9 - 10.3 mg/dL 8.7(L) 9.2 8.9  Total Protein 6.5 - 8.1 g/dL - 5.6(L) -  Total Bilirubin 0.3 - 1.2 mg/dL - 1.2 -  Alkaline Phos 38 - 126 U/L - 82 -  AST 15 - 41 U/L - 40 -  ALT 0 - 44 U/L - 28 -       Micro Results Recent Results (from the past 240 hour(s))  Blood Culture (routine x 2)     Status: None (Preliminary result)   Collection Time: 05/01/21 11:16 AM   Specimen: BLOOD RIGHT FOREARM  Result Value Ref Range Status   Specimen Description   Final    BLOOD RIGHT FOREARM BOTTLES DRAWN AEROBIC AND ANAEROBIC   Special Requests   Final    Blood Culture adequate volume Performed at [facility], [address] [place], [place] [address]    Culture PENDING  Incomplete   Report Status  PENDING  Incomplete  Blood Culture (routine x 2)     Status: None (Preliminary result)   Collection Time: 05/01/21 11:17 AM  Specimen: BLOOD LEFT FOREARM  Result Value Ref Range Status   Specimen Description   Final    BLOOD LEFT FOREARM BOTTLES DRAWN AEROBIC AND ANAEROBIC   Special Requests   Final    Blood Culture adequate volume Performed at [facility], [address], [place], [place] [address]    Culture PENDING  Incomplete   Report Status PENDING  Incomplete  Resp Panel by RT-PCR (Flu A&B, Covid) Nasopharyngeal Swab     Status: None   Collection Time: 05/01/21  1:15 PM   Specimen: Nasopharyngeal Swab; Nasopharyngeal(NP) swabs in vial transport medium  Result Value Ref Range Status   SARS Coronavirus 2 by RT PCR NEGATIVE NEGATIVE Final    Comment: (NOTE) SARS-CoV-2 target nucleic acids are NOT DETECTED.  The SARS-CoV-2 RNA is generally detectable in upper respiratory specimens during the acute phase of infection. The lowest concentration of SARS-CoV-2 viral copies this assay can detect is 138 copies/mL. A negative result does not preclude SARS-Cov-2 infection and should not be used as the sole basis for treatment or other patient management decisions. A negative result may occur with  improper specimen collection/handling, submission of specimen other than nasopharyngeal swab, presence of viral mutation(s) within the areas targeted by this assay, and inadequate number of viral copies(<138 copies/mL). A negative result must be combined with clinical observations, patient history, and epidemiological information. The expected result is Negative.  Fact Sheet for Patients:  EntrepreneurPulse.com.au  Fact Sheet for Healthcare Providers:  IncredibleEmployment.be  This test is no t yet approved or cleared by the [place] FDA and  has been authorized for detection and/or diagnosis of SARS-CoV-2 by FDA under an Emergency Use  Authorization (EUA). This EUA will remain  in effect (meaning this test can be used) for the duration of the COVID-19 declaration under Section 564(b)(1) of the Act, 21 U.S.C.section 360bbb-3(b)(1), unless the authorization is terminated  or revoked sooner.       Influenza A by PCR NEGATIVE NEGATIVE Final   Influenza B by PCR NEGATIVE NEGATIVE Final    Comment: (NOTE) The Xpert Xpress SARS-CoV-2/FLU/RSV plus assay is intended as an aid in the diagnosis of influenza from Nasopharyngeal swab specimens and should not be used as a sole basis for treatment. Nasal washings and aspirates are unacceptable for Xpert Xpress SARS-CoV-2/FLU/RSV testing.  Fact Sheet for Patients: EntrepreneurPulse.com.au  Fact Sheet for Healthcare Providers: IncredibleEmployment.be  This test is not yet approved or cleared by the [place] FDA and has been authorized for detection and/or diagnosis of SARS-CoV-2 by FDA under an Emergency Use Authorization (EUA). This EUA will remain in effect (meaning this test can be used) for the duration of the COVID-19 declaration under Section 564(b)(1) of the Act, 21 U.S.C. section 360bbb-3(b)(1), unless the authorization is terminated or revoked.  Performed at [facility], [address], [place], [place] [address]   MRSA Next Gen by PCR, Nasal     Status: None   Collection Time: 05/02/21  8:03 AM   Specimen: Nasal Mucosa; Nasal Swab  Result Value Ref Range Status   MRSA by PCR Next Gen NOT DETECTED NOT DETECTED Final    Comment: (NOTE) The GeneXpert MRSA Assay (FDA approved for NASAL specimens only), is one component of a comprehensive MRSA colonization surveillance program. It is not intended to diagnose MRSA infection nor to guide or monitor treatment for MRSA infections. Test performance is not FDA approved in patients less than 55 years old. Performed at [facility], [address],  [place], [place] [address]      Radiology Reports No results found.  SIGNED: Deatra James, MD, FHM. [facility],  Pager (please use [email or web address] to page/text) Please use Epic Secure Chat for non-urgent communication (7AM-7PM)  Time spent > 66 minutes of critical care time seeing evaluating examining patient reviewing all medical records imaging labs, medications stabilized with patient discussing care with consultants .Marland KitchenAn extensive family meeting    7PM-7AM, please contact night-coverage [email or web address], 05/02/2021, 12:41 PM

## 2021-05-02 NOTE — Consult Note (Addendum)
Virtual Visit via Video Note  I connected with [name] on 05/02/2021  by a video enabled telemedicine application and verified that I am speaking with the correct person using two identifiers.  Location: Patient: [name] ICU Provider:  [facility]   I discussed the limitations of evaluation and management by telemedicine and the availability of in person appointments. The patient expressed understanding and agreed to proceed.     Date of Admission:  05/01/2021          Reason for Consult:  Fever, encephalopathy   Referring Provider: Skipper Cliche, MD   Assessment:  Fever encephalopathy likely due to  Aspiration pneumonia (Less likely UTI) Alzheimer's type dementia Rheumatoid arthritis with pulmonary fibrosis on 2 L via nasal cannula chronically BPH with incomplete voiding Recent proximal femur fracture on the left side not amenable to ORIF Recent problems with delirium and sundowning since his hip fracture that have continued in the skilled nursing facility and in the ICU now  Plan:  Narrow to cefepime and oral metronidazole Follow-up blood and urine cultures Hopefully can try to optimize his delirium but I fear the hip fracture and now infection may have taken additional toll on his cognition.  Principal Problem:   Sepsis ([place]) Active Problems:   CAD (coronary artery disease), native coronary artery   Hyperlipidemia   Paroxysmal atrial fibrillation (HCC)   Pulmonary fibrosis assoc with RA    Essential hypertension   Abdominal aortic aneurysm without rupture   Closed left hip fracture (HCC)   Rheumatoid arteritis (HCC)   Chronic respiratory failure with hypoxia (HCC)   Chronic diastolic (congestive) heart failure (HCC)   Vascular dementia without behavioral disturbance (HCC)   Iron deficiency anemia due to chronic blood loss   Scheduled Meds:  ascorbic acid  500 mg Oral Daily   aspirin  81 mg Oral Daily   atorvastatin  40 mg Oral Daily   calcium carbonate   1,250 mg Oral Q breakfast   Chlorhexidine Gluconate Cloth  6 each Topical Daily   DULoxetine  30 mg Oral Daily   ferrous sulfate  325 mg Oral Q breakfast   heparin  5,000 Units Subcutaneous Q8H   lactobacillus  1 g Oral TID WC   methylPREDNISolone (SOLU-MEDROL) injection  20 mg Intravenous Q24H   metoprolol tartrate  12.5 mg Oral BID   metroNIDAZOLE  500 mg Oral Q12H   multivitamin with minerals  1 tablet Oral Daily   sodium chloride flush  3 mL Intravenous Q12H   sodium chloride flush  3 mL Intravenous Q12H   sulfaSALAzine  1,000 mg Oral TID   tamsulosin  0.4 mg Oral Daily   [START ON 05/08/2021] Vitamin D (Ergocalciferol)  50,000 Units Oral Q Sun   Continuous Infusions:  sodium chloride     ceFEPime (MAXIPIME) IV 2 g (05/02/21 1121)   PRN Meds:.sodium chloride, acetaminophen **OR** acetaminophen, bisacodyl, fentaNYL (SUBLIMAZE) injection, haloperidol lactate, hydrALAZINE, HYDROmorphone (DILAUDID) injection, ipratropium, levalbuterol, LORazepam, ondansetron **OR** ondansetron (ZOFRAN) IV, polyvinyl alcohol, senna-docusate, sodium chloride flush, traZODone  HPI: [name] is a 88 y.o. male with past medical history significant for Alzheimer's type dementia rheumatoid arthritis complicated by pulmonary fibrosis with chronic hypoxemic respiratory failure on 2 L of oxygen via nasal cannula coronary artery disease hypertension hyperlipidemia abdominal aortic aneurysm, who sustained a hip fracture after mechanical fall on April 25, 2020.  The fracture was not amenable to ORIF.  He was ultimately discharged to skilled nursing facility.  This time  post fracture has been characterized by trouble with "sundowning "  Wife who is at the bedside with him today says that it times the patient has been Near the nursing station at the skilled nursing facility due to his ongoing confusion.  Had what sounds like obstructive pathology due to enlarged prostate with incomplete voiding and voiding of  small amounts of urine.  Urinary frequency has increased recently.  He has not complained of abdominal pain or had nausea or vomiting or not been any overt witnessed aspiration events.  The patient had been diagnosed with possible pneumonia while at skilled nursing facility and was on doxycycline and cefdinir.  Despite that he developed fevers and ultimately also fell.  ER he was quite confused and unable to provide any history he was tachycardic with tachypnea fevers and mild lactic acidosis on labs.  Urinalysis showed 6-10 white blood cells and a few bacteria.  Patient labs are pertinent for profound leukocytosis with a white count of 86,754 neutrophilic predominance.  BMP pertinent for some uremia and increasing creatinine from 0.64-0.93.  Chest x-ray had shown bilateral diffuse interstitial and patchy airspace disease.  Patient had CT angiogram that was negative for pulmonary embolism.  It did show a thickened esophagus with fluid and debris within the esophagus suggestive of possible aspiration.  The patient had progressive findings of pulmonary fibrosis in both lungs as well as opacities in the lower lobes right greater than left concerning for superimposed pneumonia.    Blood cultures were taken along with urine cultures and he was initially placed on broad-spectrum antibiotics that included vancomycin cefepime Flagyl and doxycycline.  Has been admitted to the intensive care unit.  Overnight he seems to have improved , with fevers coming down cognition improving and oxygen requirements now back to his baseline of 2 L.  His wife who is at the bedside said he is still quite confused though she says he is much improved compared to yesterday.  So far blood cultures are still incubating and urine cultures not yet back.  I suspect that he has had an aspiration pneumonia and certainly is at risk for pneumonia for multiple reasons including his chronic hypoxemic respiratory failure his  fibrosis and his dementia.  I will discontinue his vancomycin since I do not think that MRSA is likely to be a pathogen in his lungs and his MRSA PCR for what its worth is negative.  I will also discontinue his doxycycline.  I will continue IV cefepime for now and change his metronidazole to oral metronidazole.  I spent 110 minutes with the patient including than 50% of the time in face to face counseling of the patient and his surrogate his wife over the care agility video platform, personally reviewing chest x-ray CT of the lungs updated blood cultures urine cultures CBC CMP procalcitonin inflammatory markers along with review of medical records in preparation for the visit and during the visit and in coordination of his care with Dr. Roger Shelter.      Review of Systems: Review of Systems  Unable to perform ROS: Dementia   Past Medical History:  Diagnosis Date   AAA (abdominal aortic aneurysm)    Anemia    Arthritis    ra   Atrial fibrillation ([place])    COPD (chronic obstructive pulmonary disease) ([place]) Jan.2016   Small amount of Emphysema- Pulmonary Fibrosis ( Feb. 9, 2016 )   Coronary atherosclerosis    Dysrhythmia    A-Fib   Hematuria, gross  HISTORY OF  ?  2004   Hypertension    dr Daneen Schick   Immune disorder [facility])    Myocardial infarction ([place]) 2000   Pulmonary fibrosis ([place])    Rheumatoid arthritis ([place])    Shortness of breath    with exertion   Staph skin infection    Back    Social History   Tobacco Use   Smoking status: Former    Packs/day: 3.00    Years: 50.00    Pack years: 150.00    Types: Cigarettes    Quit date: 03/27/1978    Years since quitting: 43.1   Smokeless tobacco: Former    Types: Chew  Substance Use Topics   Alcohol use: No    Alcohol/week: 0.0 standard drinks   Drug use: No    Family History  Problem Relation Age of Onset   Heart disease Brother        Heart Disease before age 71   Stroke Brother    Heart attack Brother     Hypertension Brother    Diabetes Son    Heart disease Son        Heart Disease before age 39   Hypertension Son    Heart attack Son    Bleeding Disorder Father    COPD Father        Emphysema   Varicose Veins Sister    COPD Sister    Allergies  Allergen Reactions   Morphine Other (See Comments)    "makes me crazy"   Morphine And Related Nausea And Vomiting and Other (See Comments)    Hallucinations    Nirmatrelvir-Ritonavir Other (See Comments)    Other reaction(s): confusion     OBJECTIVE: Blood pressure 140/61, pulse 94, temperature 98.5 F (36.9 C), temperature source Oral, resp. rate (!) 0, height 6' (1.829 m), weight 73.5 kg, SpO2 100 %.  Physical Exam Vitals and nursing note reviewed.  HENT:     Head: Normocephalic and atraumatic.  Eyes:     Extraocular Movements: Extraocular movements intact.     Conjunctiva/sclera: Conjunctivae normal.  Cardiovascular:     Rate and Rhythm: Tachycardia present.  Pulmonary:     Effort: No respiratory distress.     Breath sounds: No wheezing.  Skin:    General: Skin is dry.     Findings: No rash.  Neurological:     Mental Status: He is alert. He is disoriented.  Psychiatric:        Attention and Perception: He is inattentive.        Mood and Affect: Mood normal.        Speech: Speech is delayed.        Behavior: Behavior is cooperative.        Cognition and Memory: Cognition is impaired. Memory is impaired. He exhibits impaired recent memory and impaired remote memory.  Fidgets with brace on right leg  Lab Results Lab Results  Component Value Date   WBC 16.3 (H) 05/02/2021   HGB 8.1 (L) 05/02/2021   HCT 25.0 (L) 05/02/2021   MCV 102.5 (H) 05/02/2021   PLT 249 05/02/2021    Lab Results  Component Value Date   CREATININE 0.69 05/02/2021   BUN 31 (H) 05/02/2021   NA 139 05/02/2021   K 4.3 05/02/2021   CL 110 05/02/2021   CO2 21 (L) 05/02/2021    Lab Results  Component Value Date   ALT 28 05/01/2021   AST 40  05/01/2021  ALKPHOS 82 05/01/2021   BILITOT 1.2 05/01/2021     Microbiology: Recent Results (from the past 240 hour(s))  Blood Culture (routine x 2)     Status: None (Preliminary result)   Collection Time: 05/01/21 11:16 AM   Specimen: BLOOD RIGHT FOREARM  Result Value Ref Range Status   Specimen Description   Final    BLOOD RIGHT FOREARM BOTTLES DRAWN AEROBIC AND ANAEROBIC   Special Requests   Final    Blood Culture adequate volume Performed at [facility], [address] [place], [place] [address]    Culture PENDING  Incomplete   Report Status PENDING  Incomplete  Blood Culture (routine x 2)     Status: None (Preliminary result)   Collection Time: 05/01/21 11:17 AM   Specimen: BLOOD LEFT FOREARM  Result Value Ref Range Status   Specimen Description   Final    BLOOD LEFT FOREARM BOTTLES DRAWN AEROBIC AND ANAEROBIC   Special Requests   Final    Blood Culture adequate volume Performed at [facility], [address], [place],  [address]    Culture PENDING  Incomplete   Report Status PENDING  Incomplete  Resp Panel by RT-PCR (Flu A&B, Covid) Nasopharyngeal Swab     Status: None   Collection Time: 05/01/21  1:15 PM   Specimen: Nasopharyngeal Swab; Nasopharyngeal(NP) swabs in vial transport medium  Result Value Ref Range Status   SARS Coronavirus 2 by RT PCR NEGATIVE NEGATIVE Final    Comment: (NOTE) SARS-CoV-2 target nucleic acids are NOT DETECTED.  The SARS-CoV-2 RNA is generally detectable in upper respiratory specimens during the acute phase of infection. The lowest concentration of SARS-CoV-2 viral copies this assay can detect is 138 copies/mL. A negative result does not preclude SARS-Cov-2 infection and should not be used as the sole basis for treatment or other patient management decisions. A negative result may occur with  improper specimen collection/handling, submission of specimen other than nasopharyngeal swab, presence of viral mutation(s) within  the areas targeted by this assay, and inadequate number of viral copies(<138 copies/mL). A negative result must be combined with clinical observations, patient history, and epidemiological information. The expected result is Negative.  Fact Sheet for Patients:  EntrepreneurPulse.com.au  Fact Sheet for Healthcare Providers:  IncredibleEmployment.be  This test is no t yet approved or cleared by the [place] FDA and  has been authorized for detection and/or diagnosis of SARS-CoV-2 by FDA under an Emergency Use Authorization (EUA). This EUA will remain  in effect (meaning this test can be used) for the duration of the COVID-19 declaration under Section 564(b)(1) of the Act, 21 U.S.C.section 360bbb-3(b)(1), unless the authorization is terminated  or revoked sooner.       Influenza A by PCR NEGATIVE NEGATIVE Final   Influenza B by PCR NEGATIVE NEGATIVE Final    Comment: (NOTE) The Xpert Xpress SARS-CoV-2/FLU/RSV plus assay is intended as an aid in the diagnosis of influenza from Nasopharyngeal swab specimens and should not be used as a sole basis for treatment. Nasal washings and aspirates are unacceptable for Xpert Xpress SARS-CoV-2/FLU/RSV testing.  Fact Sheet for Patients: EntrepreneurPulse.com.au  Fact Sheet for Healthcare Providers: IncredibleEmployment.be  This test is not yet approved or cleared by the [place] FDA and has been authorized for detection and/or diagnosis of SARS-CoV-2 by FDA under an Emergency Use Authorization (EUA). This EUA will remain in effect (meaning this test can be used) for the duration of the COVID-19 declaration under Section 564(b)(1) of the Act,  21 U.S.C. section 360bbb-3(b)(1), unless the authorization is terminated or revoked.  Performed at [facility], [address] [place], [place] [address]   MRSA Next Gen by PCR, Nasal     Status: None   Collection  Time: 05/02/21  8:03 AM   Specimen: Nasal Mucosa; Nasal Swab  Result Value Ref Range Status   MRSA by PCR Next Gen NOT DETECTED NOT DETECTED Final    Comment: (NOTE) The GeneXpert MRSA Assay (FDA approved for NASAL specimens only), is one component of a comprehensive MRSA colonization surveillance program. It is not intended to diagnose MRSA infection nor to guide or monitor treatment for MRSA infections. Test performance is not FDA approved in patients less than 14 years old. Performed at [facility], [address], [place], [place] [address]     Alcide Evener, [place] for [facility] [phone] pager  05/02/2021, 12:33 PM

## 2021-05-02 NOTE — Plan of Care (Signed)
°  Problem: Acute Rehab PT Goals(only PT should resolve) Goal: Pt Will Go Supine/Side To Sit Outcome: Progressing Flowsheets (Taken 05/02/2021 1518) Pt will go Supine/Side to Sit:  with minimal assist  with moderate assist Goal: Patient Will Transfer Sit To/From Stand Outcome: Progressing Flowsheets (Taken 05/02/2021 1518) Patient will transfer sit to/from stand:  with minimal assist  with moderate assist Goal: Pt Will Transfer Bed To Chair/Chair To Bed Outcome: Progressing Flowsheets (Taken 05/02/2021 1518) Pt will Transfer Bed to Chair/Chair to Bed:  with min assist  with mod assist Goal: Pt Will Ambulate Outcome: Progressing Flowsheets (Taken 05/02/2021 1518) Pt will Ambulate:  25 feet  with minimal assist  with moderate assist  with rolling walker   3:19 PM, 05/02/21 Lonell Grandchild, MPT Physical Therapist with [facility] [phone] office [phone] mobile phone

## 2021-05-03 DIAGNOSIS — I714 Abdominal aortic aneurysm, without rupture, unspecified: Secondary | ICD-10-CM

## 2021-05-03 DIAGNOSIS — D500 Iron deficiency anemia secondary to blood loss (chronic): Secondary | ICD-10-CM

## 2021-05-03 DIAGNOSIS — I10 Essential (primary) hypertension: Secondary | ICD-10-CM

## 2021-05-03 DIAGNOSIS — R451 Restlessness and agitation: Secondary | ICD-10-CM

## 2021-05-03 LAB — CBC
HCT: 26.4 % — ABNORMAL LOW (ref 39.0–52.0)
Hemoglobin: 8 g/dL — ABNORMAL LOW (ref 13.0–17.0)
MCH: 31.3 pg (ref 26.0–34.0)
MCHC: 30.3 g/dL (ref 30.0–36.0)
MCV: 103.1 fL — ABNORMAL HIGH (ref 80.0–100.0)
Platelets: 275 10*3/uL (ref 150–400)
RBC: 2.56 MIL/uL — ABNORMAL LOW (ref 4.22–5.81)
RDW: 14.5 % (ref 11.5–15.5)
WBC: 12.2 10*3/uL — ABNORMAL HIGH (ref 4.0–10.5)
nRBC: 0 % (ref 0.0–0.2)

## 2021-05-03 LAB — URINE CULTURE: Culture: NO GROWTH

## 2021-05-03 LAB — BASIC METABOLIC PANEL
Anion gap: 7 (ref 5–15)
BUN: 31 mg/dL — ABNORMAL HIGH (ref 8–23)
CO2: 24 mmol/L (ref 22–32)
Calcium: 8.8 mg/dL — ABNORMAL LOW (ref 8.9–10.3)
Chloride: 107 mmol/L (ref 98–111)
Creatinine, Ser: 0.73 mg/dL (ref 0.61–1.24)
GFR, Estimated: >60 mL/min (ref >60)
Glucose, Bld: 101 mg/dL — ABNORMAL HIGH (ref 70–99)
Potassium: 4.3 mmol/L (ref 3.5–5.1)
Sodium: 138 mmol/L (ref 135–145)

## 2021-05-03 LAB — GLUCOSE, CAPILLARY: Glucose-Capillary: 108 mg/dL — ABNORMAL HIGH (ref 70–99)

## 2021-05-03 MED ORDER — TRAZODONE HCL 50 MG PO TABS
25.0000 mg | ORAL_TABLET | Freq: Every day | ORAL | Status: DC
  Administered 2021-05-03: 25 mg via ORAL
  Filled 2021-05-03: qty 1

## 2021-05-03 MED ORDER — AMOXICILLIN-POT CLAVULANATE 875-125 MG PO TABS
1.0000 | ORAL_TABLET | Freq: Two times a day (BID) | ORAL | Status: DC
  Administered 2021-05-03 – 2021-05-04 (×3): 1 via ORAL
  Filled 2021-05-03 (×3): qty 1

## 2021-05-03 MED ORDER — TRAMADOL HCL 50 MG PO TABS: 50.0000 mg | ORAL_TABLET | Freq: Four times a day (QID) | ORAL | Status: DC | PRN

## 2021-05-03 MED ORDER — HYDROMORPHONE HCL 1 MG/ML IJ SOLN
0.5000 mg | INTRAMUSCULAR | Status: DC | PRN
  Administered 2021-05-03 – 2021-05-04 (×2): 1 mg via INTRAVENOUS
  Filled 2021-05-03 (×2): qty 1

## 2021-05-03 MED ORDER — ACETAMINOPHEN 500 MG PO TABS
1000.0000 mg | ORAL_TABLET | Freq: Three times a day (TID) | ORAL | Status: DC
  Administered 2021-05-03 – 2021-05-04 (×4): 1000 mg via ORAL
  Filled 2021-05-03 (×4): qty 2

## 2021-05-03 NOTE — Progress Notes (Signed)
Palliative:  HPI: 88 y.o. male  with past medical history of dementia, atrial fibrillation, CAD, hypertension, MI, pulmonary fibrosis on 2L chronically, AAA, anemia, rheumatoid arthritis admitted on 05/01/2021 with fever, shortness of breath from [facility] where he was on cefdinir and doxycycline for possible pneumonia. Admitted with sepsis pneumonia vs UTI. Of note he was recently hospitalized 1/30-04/28/21 for fall at home with resultant L hip fracture with decision that fracture was not amendable for surgical repair and he was sent to SNF rehab. Family have noted that he had a fall at [facility] rehab 04/30/21.     I met today at Mr. Copen bedside but he is fluctuating between agitation and lethargy. He has been extremely agitated much of the morning and over night per report. I met and had extensive conversation with wife outside of room.   I spoke with Baldo Ash about Mr. Ander and his progression of disease over the past ~ 1 year as well as falls and complications over the past month. She is able to express the decline in quality of life and dignity as the struggle with trying to care for him. She has struggled to care for him but this is becoming increasingly difficult and with his recent falls and injury she will not be able to continue to care for him at home. We did discuss the greatest challenge and barrier to any improvement would be from his progressing dementia. We discussed the reality that he may not be able to rehab from this injury as he has in the past due to his dementia. Baldo Ash understands. She wants for him to improve and be able to walk but she also recognizes his suffering.   I had an honest conversation with Baldo Ash about the path forward and expectations. Baldo Ash verifies DNR status and says that her husband has been very clear on these wishes. I also discussed with Baldo Ash that at some stage families will tell me that their loved one has no quality of life and this would not be how  they would wish to live and make choices to further focus on comfort and symptom management. I explained that in some situations the loving choice is made to forego blood sticks and labs and even IVs and not to worry about numbers but solely on comfort and making sure that he is resting comfortably. Baldo Ash expresses understanding and shares that her priority is ensuring that his pain and agitation are better managed.   She shares that she is beginning to believe that comfort may be the best path for Mr. Girtman. I encouraged Baldo Ash to continue to think and speak with her family about what is best. In the meantime we will make medication adjustments to medication with a goal to better control pain and agitation - knowing that this is a moving target and may take time to know what will work best for him. We will continue to speak daily about our progress and options moving forward. I also encouraged Baldo Ash to prioritize herself to allow time for rest and self care.   All questions/concerns addressed. Emotional support provided.   Exam: Agitated/restless, confused. Pulling at tubes/wires and sheets. HR stable. Breathing unlabored on 2L.   Plan: - Ongoing goals of care discussions and support with wife Baldo Ash.  - Pain: Tylenol 1000 mg every 8 hours scheduled. PRN tramadol for moderate pain. PRN dilaudid IV for severe pain.  - Agitation: Trazodone 25 mg qhs. Haldol 2 mg IV every 6 hours PRN - may  consider scheduled doses if needed.   460 min  Vikram Tillett, NP Palliative Medicine Team Pager [phone] (Please see [email or web address] for schedule) Team Phone [phone]    Greater than 50%  of this time was spent counseling and coordinating care related to the above assessment and plan

## 2021-05-03 NOTE — Progress Notes (Signed)
PT Cancellation Note  Patient Details Name: [name] MRN: [id number] DOB: 1930/12/16   Cancelled Treatment:    Reason Eval/Treat Not Completed: Other (comment).  Patient confused, unable to participate with therapy and padding for abduction brace soiled and being cleaned and not available for patient to use at this time.   2:06 PM, 05/03/21 Lonell Grandchild, MPT Physical Therapist with [facility] [phone] office [phone] mobile phone

## 2021-05-03 NOTE — TOC Progression Note (Signed)
Transition of Care [facility]) - Progression Note    Patient Details  Name: [name] MRN: [id number] Date of Birth: [birth year]  [facility] CM/SW Contact  Boneta Lucks, RN Phone Number: 05/03/2021, 2:14 PM  Clinical Narrative:   MD had palliative discussion with family. [facility] will take patient back with Palliative services. [facility] AUTH started.    Expected Discharge Plan: Skilled Nursing Facility Barriers to Discharge: Continued Medical Work up  Expected Discharge Plan and Services Expected Discharge Plan: [place] In-house Referral: Clinical Social Work   Post Acute Care Choice: Resumption of Svcs/PTA Provider Living arrangements for the past 2 months: [place]                 Readmission Risk Interventions Readmission Risk Prevention Plan 05/02/2021  Transportation Screening Complete  Medication Review [employer]) Complete  HRI or Home Care Consult Complete  SW Recovery Care/Counseling Consult Complete  Palliative Care Screening Not Applicable  Skilled Nursing Facility Complete  Some recent data might be hidden

## 2021-05-03 NOTE — Progress Notes (Signed)
Virtual Visit via Video Note  I connected with [name] on 05/03/2021 by a video enabled telemedicine application and verified that I am speaking with the correct person using two identifiers.  Location: Patient: ICU 7 Provider: [facility]   I discussed the limitations of evaluation and management by telemedicine and the availability of in person appointments. The patient expressed understanding and agreed to proceed.         Subjective:  Pt is confused   Antibiotics:  Anti-infectives (From admission, onward)    Start     Dose/Rate Route Frequency Ordered Stop   05/03/21 1530  amoxicillin-clavulanate (AUGMENTIN) 875-125 MG per tablet 1 tablet        1 tablet Oral Every 12 hours 05/03/21 1438 05/08/21 0959   05/02/21 1400  vancomycin (VANCOREADY) IVPB 1250 mg/250 mL  Status:  Discontinued       See Hyperspace for full Linked Orders Report.   1,250 mg 166.7 mL/hr over 90 Minutes Intravenous Every 24 hours 05/01/21 1243 05/02/21 0935   05/02/21 1330  metroNIDAZOLE (FLAGYL) tablet 500 mg  Status:  Discontinued        500 mg Oral Every 12 hours 05/02/21 1231 05/03/21 1438   05/02/21 0800  ceFEPIme (MAXIPIME) 2 g in sodium chloride 0.9 % 100 mL IVPB  Status:  Discontinued        2 g 200 mL/hr over 30 Minutes Intravenous Every 8 hours 05/02/21 0757 05/03/21 1438   05/01/21 2200  ceFEPIme (MAXIPIME) 2 g in sodium chloride 0.9 % 100 mL IVPB  Status:  Discontinued        2 g 200 mL/hr over 30 Minutes Intravenous Every 12 hours 05/01/21 1245 05/02/21 0757   05/01/21 2200  doxycycline (VIBRA-TABS) tablet 100 mg  Status:  Discontinued        100 mg Oral 2 times daily 05/01/21 1443 05/02/21 1231   05/01/21 1400  vancomycin (VANCOREADY) IVPB 1500 mg/300 mL       See Hyperspace for full Linked Orders Report.   1,500 mg 150 mL/hr over 120 Minutes Intravenous  Once 05/01/21 1243 05/01/21 1653   05/01/21 1245  ceFEPIme (MAXIPIME) 2 g in sodium chloride 0.9 % 100 mL IVPB        2 g 200  mL/hr over 30 Minutes Intravenous  Once 05/01/21 1233 05/01/21 1337   05/01/21 1245  metroNIDAZOLE (FLAGYL) IVPB 500 mg        500 mg 100 mL/hr over 60 Minutes Intravenous  Once 05/01/21 1233 05/01/21 1406   05/01/21 1245  vancomycin (VANCOCIN) IVPB 1000 mg/200 mL premix  Status:  Discontinued        1,000 mg 200 mL/hr over 60 Minutes Intravenous  Once 05/01/21 1233 05/01/21 1243       Medications: Scheduled Meds:  acetaminophen  1,000 mg Oral Q8H   amoxicillin-clavulanate  1 tablet Oral Q12H   aspirin  81 mg Oral Daily   calcium carbonate  1,250 mg Oral Q breakfast   Chlorhexidine Gluconate Cloth  6 each Topical Daily   DULoxetine  30 mg Oral Daily   ferrous sulfate  325 mg Oral Q breakfast   heparin  5,000 Units Subcutaneous Q8H   methylPREDNISolone (SOLU-MEDROL) injection  20 mg Intravenous Q24H   metoprolol tartrate  12.5 mg Oral BID   sodium chloride flush  3 mL Intravenous Q12H   sodium chloride flush  3 mL Intravenous Q12H   sulfaSALAzine  1,000 mg Oral TID   tamsulosin  0.4 mg Oral Daily   traZODone  25 mg Oral QHS   Continuous Infusions:  sodium chloride     PRN Meds:.sodium chloride, bisacodyl, fentaNYL (SUBLIMAZE) injection, haloperidol lactate, hydrALAZINE, HYDROmorphone (DILAUDID) injection, ipratropium, levalbuterol, ondansetron **OR** ondansetron (ZOFRAN) IV, polyvinyl alcohol, senna-docusate, sodium chloride flush    Objective: Weight change: 0.5 kg  Intake/Output Summary (Last 24 hours) at 05/03/2021 1448 Last data filed at 05/03/2021 1200 Gross per 24 hour  Intake 400.06 ml  Output 25 ml  Net 375.06 ml   Blood pressure 124/87, pulse 87, temperature 99.1 F (37.3 C), temperature source Oral, resp. rate (!) 0, height 6' (1.829 m), weight 73.5 kg, SpO2 92 %. Temp:  [97.5 F (36.4 C)-99.1 F (37.3 C)] 99.1 F (37.3 C) (02/07 1117) Pulse Rate:  [69-108] 87 (02/07 0700) Resp:  [0] 0 (02/07 0750) BP: (96-147)/(53-87) 124/87 (02/07 0700) SpO2:  [91 %-100  %] 92 % (02/07 0700) Weight:  [73.5 kg] 73.5 kg (02/07 0500)  Physical Exam: Physical Exam Constitutional:      General: He is not in acute distress.    Appearance: He is not ill-appearing, toxic-appearing or diaphoretic.  Eyes:     Extraocular Movements: Extraocular movements intact.  Pulmonary:     Effort: Pulmonary effort is normal. No tachypnea.  Abdominal:     General: There is no distension.     Palpations: Abdomen is soft.  Neurological:     Mental Status: He is alert. He is disoriented.  Psychiatric:        Attention and Perception: He is inattentive.        Behavior: Behavior is agitated.        Cognition and Memory: Cognition is not impaired. Memory is not impaired. He does not exhibit impaired recent memory or impaired remote memory.     CBC:    BMET Recent Labs    05/02/21 0430 05/03/21 0453  NA 139 138  K 4.3 4.3  CL 110 107  CO2 21* 24  GLUCOSE 102* 101*  BUN 31* 31*  CREATININE 0.69 0.73  CALCIUM 8.7* 8.8*     Liver Panel  Recent Labs    05/01/21 1113  PROT 5.6*  ALBUMIN 2.9*  AST 40  ALT 28  ALKPHOS 82  BILITOT 1.2       Sedimentation Rate Recent Labs    05/01/21 1454  ESRSEDRATE 70*   C-Reactive Protein Recent Labs    05/01/21 1437  CRP 31.5*    Micro Results: Recent Results (from the past 720 hour(s))  Blood Culture (routine x 2)     Status: None (Preliminary result)   Collection Time: 05/01/21 11:16 AM   Specimen: BLOOD RIGHT FOREARM  Result Value Ref Range Status   Specimen Description   Final    BLOOD RIGHT FOREARM BOTTLES DRAWN AEROBIC AND ANAEROBIC   Special Requests Blood Culture adequate volume  Final   Culture   Final    NO GROWTH 2 DAYS Performed at [facility], [address], [place], [place] [address]    Report Status PENDING  Incomplete  Blood Culture (routine x 2)     Status: None (Preliminary result)   Collection Time: 05/01/21 11:17 AM   Specimen: BLOOD LEFT FOREARM  Result Value Ref Range  Status   Specimen Description   Final    BLOOD LEFT FOREARM BOTTLES DRAWN AEROBIC AND ANAEROBIC   Special Requests Blood Culture adequate volume  Final   Culture   Final    NO  GROWTH 2 DAYS Performed at [facility], [address] [place], [place] [address]    Report Status PENDING  Incomplete  Resp Panel by RT-PCR (Flu A&B, Covid) Nasopharyngeal Swab     Status: None   Collection Time: 05/01/21  1:15 PM   Specimen: Nasopharyngeal Swab; Nasopharyngeal(NP) swabs in vial transport medium  Result Value Ref Range Status   SARS Coronavirus 2 by RT PCR NEGATIVE NEGATIVE Final    Comment: (NOTE) SARS-CoV-2 target nucleic acids are NOT DETECTED.  The SARS-CoV-2 RNA is generally detectable in upper respiratory specimens during the acute phase of infection. The lowest concentration of SARS-CoV-2 viral copies this assay can detect is 138 copies/mL. A negative result does not preclude SARS-Cov-2 infection and should not be used as the sole basis for treatment or other patient management decisions. A negative result may occur with  improper specimen collection/handling, submission of specimen other than nasopharyngeal swab, presence of viral mutation(s) within the areas targeted by this assay, and inadequate number of viral copies(<138 copies/mL). A negative result must be combined with clinical observations, patient history, and epidemiological information. The expected result is Negative.  Fact Sheet for Patients:  EntrepreneurPulse.com.au  Fact Sheet for Healthcare Providers:  IncredibleEmployment.be  This test is no t yet approved or cleared by the [place] FDA and  has been authorized for detection and/or diagnosis of SARS-CoV-2 by FDA under an Emergency Use Authorization (EUA). This EUA will remain  in effect (meaning this test can be used) for the duration of the COVID-19 declaration under Section 564(b)(1) of the Act, 21 U.S.C.section  360bbb-3(b)(1), unless the authorization is terminated  or revoked sooner.       Influenza A by PCR NEGATIVE NEGATIVE Final   Influenza B by PCR NEGATIVE NEGATIVE Final    Comment: (NOTE) The Xpert Xpress SARS-CoV-2/FLU/RSV plus assay is intended as an aid in the diagnosis of influenza from Nasopharyngeal swab specimens and should not be used as a sole basis for treatment. Nasal washings and aspirates are unacceptable for Xpert Xpress SARS-CoV-2/FLU/RSV testing.  Fact Sheet for Patients: EntrepreneurPulse.com.au  Fact Sheet for Healthcare Providers: IncredibleEmployment.be  This test is not yet approved or cleared by the [place] FDA and has been authorized for detection and/or diagnosis of SARS-CoV-2 by FDA under an Emergency Use Authorization (EUA). This EUA will remain in effect (meaning this test can be used) for the duration of the COVID-19 declaration under Section 564(b)(1) of the Act, 21 U.S.C. section 360bbb-3(b)(1), unless the authorization is terminated or revoked.  Performed at [facility], [address], [name], [place] [address]   Urine Culture     Status: None   Collection Time: 05/01/21  1:15 PM   Specimen: Urine, Clean Catch  Result Value Ref Range Status   Specimen Description   Final    URINE, CLEAN CATCH Performed at [facility], [address], [place], [place] [address]    Special Requests   Final    NONE Performed at [facility], [address], [place], [place] [address]    Culture   Final    NO GROWTH Performed at [facility], [place] [address], [place],  [address]    Report Status 05/03/2021 FINAL  Final  MRSA Next Gen by PCR, Nasal     Status: None   Collection Time: 05/02/21  8:03 AM   Specimen: Nasal Mucosa; Nasal Swab  Result Value Ref Range Status   MRSA by PCR Next Gen NOT DETECTED NOT  DETECTED Final    Comment: (NOTE) The GeneXpert MRSA Assay (FDA approved for NASAL specimens  only), is one component of a comprehensive MRSA colonization surveillance program. It is not intended to diagnose MRSA infection nor to guide or monitor treatment for MRSA infections. Test performance is not FDA approved in patients less than 47 years old. Performed at [facility], [address], [place], [place] [address]     Studies/Results: DG HIPS BILAT WITH PELVIS MIN 5 VIEWS  Result Date: 05/02/2021 CLINICAL DATA:  Fracture left femur EXAM: DG HIP (WITH OR WITHOUT PELVIS) 5+V BILAT COMPARISON:  Previous studies including the examination of 04/27/2021 FINDINGS: There is previous arthroplasty in both hips. Fracture is seen in the lateral cortical margin of proximal shaft at the level of lesser trochanter. No recent fracture is seen in the right hip. Osteopenia is seen in bony structures. There is evidence of endovascular stent repair of abdominal aorta and iliac arteries. IMPRESSION: Previous bilateral hip arthroplasty. There is recent essentially undisplaced fracture in the lateral aspect of proximal left femur. No fracture or dislocation is seen in the right hip. Electronically Signed   By: Elmer Picker M.D.   On: 05/02/2021 15:33      Assessment/Plan:  INTERVAL HISTORY:  patient remains confused but continues to improve clinically   Principal Problem:   Sepsis ([place]) Active Problems:   CAD (coronary artery disease), native coronary artery   Hyperlipidemia   Paroxysmal atrial fibrillation (HCC)   Pulmonary fibrosis assoc with RA    Essential hypertension   Abdominal aortic aneurysm without rupture   Closed left hip fracture (HCC)   Rheumatoid arteritis (HCC)   Chronic respiratory failure with hypoxia (HCC)   Chronic diastolic (congestive) heart failure (HCC)   Vascular dementia without behavioral disturbance (HCC)   Iron deficiency anemia due to chronic blood loss    [name] is a 88 y.o. male with  dementia, ILD from RA w recent fall, hip fracture now  admitted after fall and with fever, confusion and what appears to be an aspiration pneumonia  #1 Aspiration pneumonia:  He is on cefepime and flagyl at this moment  I think it is appropriate to change him to oral augmentin to complete total of 5 days of treatment   #2 ? UTI: urine is sterile and he does NOT have UTI  #3 Goals of care: Wife seems open to further discussions re goals of care and I suspect they will ultimately move to pure comfort care which I am supportive of.  I spent 38  minutes with the patient including than 50% of the time in face to face counseling of the patient and his surrogate decision maker his wife re his cultures, diagnosis of aspiration pneumonia  personally reviewing CXR updated culture data along with review of medical records in preparation for the visit and during the visit and in coordination of his care with Dr. Denton Brick.   LOS: 2 days   Alcide Evener 05/03/2021, 2:48 PM

## 2021-05-03 NOTE — Progress Notes (Addendum)
PROGRESS NOTE     [name], is a 88 y.o. male, DOB - [birth year], ZMO:[id number]  Admit date - 05/01/2021   Admitting Physician Deatra James, MD  Outpatient Primary MD for the patient is Lajean Manes, MD  LOS - 2  Chief Complaint  Patient presents with   Fever   Shortness of Breath        Brief Narrative:   [name] is a 88 year old male chronic respiratory failure COPD/pulmonary fibrosis 2 L dependent, CAD, MI, HTN, HLD, RA,AAA, dementia,   Presenting from [facility], SNF concern for fever.  Patient was recently hospitalized at [facility] from 1 32-2 after mechanical fall sustaining hip fracture. It was determined that his proximal femur fracture was not amendable to ORIF therefore was placed in a hip brace on the right side.  And was sent for rehab. Patient has not been anticoagulated due to dementia and falls.  He has chronic respiratory failure due to pulmonary fibrosis on 2 L of supplemental oxygen continuously. He has been empirically on cefdinir and doxycycline for empiric treatment of possible pneumonia at the facility.  ED: Upon arrival Temp: 100.7, RR 28 BP 96/54, WBC of 25.7 UA amber, trace of hemoglobin, protein, few bacteria WBC CTA: Negative for any PE chronic residual lung disease, pulmonary fibrosis, interstitial lung disease, pneumonia could not do review abnormal esophageal findings seems to be chronic with hiatal hernia, mediastinal lymphadenopathy mild, possible mild pulmonary hypertension, aortic and coronary atherosclerotic disease   -Cultures were obtained, patient was started on broad-spectrum antibiotics of cefepime and vancomycin   Assessment & Plan:   -Assessment and Plan:   Assessment and Plan: * Sepsis ([place])- (present on admission) -Sepsis - POA -suspect aspiration pneumonia related  -Leukocytosis improving, overall sepsis pathophysiology resolved -Urine and blood cultures NGTD -- CT angiogram reviewed-chronic  pulmonary fibrosis, ?? Pneumonia, also noted chronic diffuse esophageal changes possible strictures -Treated with Vancomycin, Cefepime and Flagyl -Discussed with ID Physician Dr Wendie Agreste, he recommends De-escalating antibiotics to Augmentin Monotherapy starting on 05/03/21 for 5 additional days -  Vascular dementia without behavioral disturbance ([place])- (present on admission) -Advanced Dementia with significant Cognitive and Memory Deficits -Continue supportive therapy, reorientation -Continue home Cymbalta,   Pulmonary fibrosis assoc with RA - (present on admission) - Severe pulmonary fibrosis//COPD -Patient with history of heavy tobacco use previously -PTA on home O2 at 2 L chronically -Also chronically on steroids more for rheumatoid arthritis than pulmonary issues -Continue PTA bronchodilators, steroids and supplemental oxygen  Iron deficiency anemia due to chronic blood loss- (present on admission) - We will continue iron supplements  Chronic diastolic (congestive) heart failure ([place])- (present on admission) -Compensated, no evidence of CHF exacerbation at this time, Last Echo reviewed from 05/12/2020: EJf: 45 to 50%. The left ventricle has mildly decreased function. The left ventricle demonstrates global hypokinesis. There is mild concentric left ventricular hypertrophy. Left ventricular diastolic  parameters are consistent with Grade I diastolic dysfunction (impaired relaxation   Chronic respiratory failure with hypoxia ([place])- (present on admission) - Severe pulmonary fibrosis//COPD -Patient with history of heavy tobacco use previously -PTA on home O2 at 2 L chronically -Also chronically on steroids more for rheumatoid arthritis than pulmonary issues -Continue PTA bronchodilators, steroids and supplemental oxygen  Rheumatoid arteritis ([place])- (present on admission) - Stable as needed analgesics, Hold PTA hydroxychloroquine C/n Steroids ( pt chronically on steroids  PTA)  Closed left hip fracture ([place])- (present on admission) - Apparently patient had a spontaneous left hip  fracture, was not a candidate for ORIF due to chronic advanced lung disease, pulmonary fibrosis -Currently in a brace, and SNF, pursuing PT, and hopeful for resolution, spontaneous healing -Plan to transfer back to SNF for ongoing rehab, allowed to partially bear weight by orthopedic team  Abdominal aortic aneurysm without rupture- (present on admission) - Stable -Outpatient monitoring/repeat imaging as previously advised  Essential hypertension- (present on admission) - Remained stable, continue current medication including metoprolol -As needed hydralazine  Paroxysmal atrial fibrillation ([place])- (present on admission) -Elevated [place] Vasc score -Risk versus benefits of full anticoagulation discussed with family, no full anticoagulation at this time --Continue aspirin -On metoprolol, for rate control,  CAD (coronary artery disease), native coronary artery- (present on admission) - Stable continue home medication of aspirin,  Metoprolol -Discussed with wife and granddaughter advised to discontinue Lipitor (Risk Versus benefits reviewed)  SIRS (systemic inflammatory response syndrome) (HCC)-resolved as of 05/01/2021, (present on admission) -Sepsis -unknown etiology likely UTI versus pneumonia  -On arrival met sepsis criteria: TM 100.3, PR 103,, RR 25, BP 95/57, -WBC 25.7  - CT angiogram reviewed-chronic pulmonary fibrosis pneumonia could not be ruled out-diffuse esophageal changes possible strictures-chronic  -Urine cloudy trace of hemoglobin few bacteria, WBC 6-10  -Blood and urine culture has been obtained in ED  -We will follow-up with blood/urine cultures  -Patient presenting from [facility] SNF -Patient has been empirically antibiotics at the SNF doxycycline, Omnicef,  -Gentle IV fluid hydration has been initiated due to chronic pulmonary fibrosis  -Broad-spectrum  IV antibiotic initiated in ED cefepime and vancomycin  Hyperlipidemia-resolved as of 05/03/2021, (present on admission) -Discussed with wife and granddaughter advised to discontinue Lipitor (Risk Versus benefits reviewed)  Social/Ethics:-Palliative care consult appreciated, wife agrees with DNR status -Wife understands that if patient fails to improve transitioning to palliative service and hospice care will become an option if they so desire  Disposition/Need for in-Hospital Stay- patient unable to be discharged at this time due to -sepsis pathophysiology, requiring antibiotics, -Awaiting possible transfer back to SNF rehab  Status is: Inpatient  Remains inpatient appropriate because:   Disposition: The patient is from: SNF              Anticipated d/c is to: SNF              Anticipated d/c date is: 1 day              Patient currently is not medically stable to d/c. Barriers: Not Clinically Stable-   Code Status :  -  Code Status: DNR   Family Communication:   Discussed with patient's wife at bedside as well as patient's granddaughter Anderson [place]  Consults  : ID  DVT Prophylaxis  :   - SCDs  heparin injection 5,000 Units Start: 05/01/21 2200 TED hose Start: 05/01/21 1432 SCDs Start: 05/01/21 1432    Lab Results  Component Value Date   PLT 275 05/03/2021    Inpatient Medications  Scheduled Meds:  acetaminophen  1,000 mg Oral Q8H   amoxicillin-clavulanate  1 tablet Oral Q12H   aspirin  81 mg Oral Daily   calcium carbonate  1,250 mg Oral Q breakfast   Chlorhexidine Gluconate Cloth  6 each Topical Daily   DULoxetine  30 mg Oral Daily   ferrous sulfate  325 mg Oral Q breakfast   heparin  5,000 Units Subcutaneous Q8H   methylPREDNISolone (SOLU-MEDROL) injection  20 mg Intravenous Q24H   metoprolol tartrate  12.5 mg Oral BID   sodium  chloride flush  3 mL Intravenous Q12H   sodium chloride flush  3 mL Intravenous Q12H   sulfaSALAzine  1,000 mg Oral TID   tamsulosin  0.4 mg  Oral Daily   traZODone  25 mg Oral QHS   Continuous Infusions:  sodium chloride     PRN Meds:.sodium chloride, bisacodyl, fentaNYL (SUBLIMAZE) injection, haloperidol lactate, hydrALAZINE, HYDROmorphone (DILAUDID) injection, ipratropium, levalbuterol, ondansetron **OR** ondansetron (ZOFRAN) IV, polyvinyl alcohol, senna-docusate, sodium chloride flush   Anti-infectives (From admission, onward)    Start     Dose/Rate Route Frequency Ordered Stop   05/03/21 1530  amoxicillin-clavulanate (AUGMENTIN) 875-125 MG per tablet 1 tablet        1 tablet Oral Every 12 hours 05/03/21 1438 05/08/21 0959   05/02/21 1400  vancomycin (VANCOREADY) IVPB 1250 mg/250 mL  Status:  Discontinued       See Hyperspace for full Linked Orders Report.   1,250 mg 166.7 mL/hr over 90 Minutes Intravenous Every 24 hours 05/01/21 1243 05/02/21 0935   05/02/21 1330  metroNIDAZOLE (FLAGYL) tablet 500 mg  Status:  Discontinued        500 mg Oral Every 12 hours 05/02/21 1231 05/03/21 1438   05/02/21 0800  ceFEPIme (MAXIPIME) 2 g in sodium chloride 0.9 % 100 mL IVPB  Status:  Discontinued        2 g 200 mL/hr over 30 Minutes Intravenous Every 8 hours 05/02/21 0757 05/03/21 1438   05/01/21 2200  ceFEPIme (MAXIPIME) 2 g in sodium chloride 0.9 % 100 mL IVPB  Status:  Discontinued        2 g 200 mL/hr over 30 Minutes Intravenous Every 12 hours 05/01/21 1245 05/02/21 0757   05/01/21 2200  doxycycline (VIBRA-TABS) tablet 100 mg  Status:  Discontinued        100 mg Oral 2 times daily 05/01/21 1443 05/02/21 1231   05/01/21 1400  vancomycin (VANCOREADY) IVPB 1500 mg/300 mL       See Hyperspace for full Linked Orders Report.   1,500 mg 150 mL/hr over 120 Minutes Intravenous  Once 05/01/21 1243 05/01/21 1653   05/01/21 1245  ceFEPIme (MAXIPIME) 2 g in sodium chloride 0.9 % 100 mL IVPB        2 g 200 mL/hr over 30 Minutes Intravenous  Once 05/01/21 1233 05/01/21 1337   05/01/21 1245  metroNIDAZOLE (FLAGYL) IVPB 500 mg        500  mg 100 mL/hr over 60 Minutes Intravenous  Once 05/01/21 1233 05/01/21 1406   05/01/21 1245  vancomycin (VANCOCIN) IVPB 1000 mg/200 mL premix  Status:  Discontinued        1,000 mg 200 mL/hr over 60 Minutes Intravenous  Once 05/01/21 1233 05/01/21 1243         Subjective: [name] today has no fevers, no emesis,  No chest pain,   No fever  Or chills    Objective: Vitals:   05/03/21 0500 05/03/21 0700 05/03/21 0750 05/03/21 1117  BP:  124/87    Pulse:  87    Resp:  (!) 0 (!) 0   Temp:   98.2 F (36.8 C) 99.1 F (37.3 C)  TempSrc:   Oral Oral  SpO2:  92%    Weight: 73.5 kg     Height:        Intake/Output Summary (Last 24 hours) at 05/03/2021 1506 Last data filed at 05/03/2021 1200 Gross per 24 hour  Intake 400.06 ml  Output 25 ml  Net 375.06  ml   Filed Weights   05/01/21 1058 05/02/21 0123 05/03/21 0500  Weight: 73 kg 73.5 kg 73.5 kg    Physical Exam  Gen:- Awake Alert,  , intermittently confused and disoriented HEENT:- [place].AT, No sclera icterus Nose- Wink 2L/min Neck-Supple Neck,No JVD,.  Lungs-Velcro type rales consistent with underlying fibrosis, and movement is fair  CV- S1, S2 normal, irregular  Abd-  +ve B.Sounds, Abd Soft, No tenderness,    Extremity/Skin:- No  edema, pedal pulses present  Psych-episodes of restlessness, agitation, confusion and disorientation from time to time  neuro-no new focal deficits, no tremors MSK-limited mobility due to hip fracture  Data Reviewed: I have personally reviewed following labs and imaging studies  CBC: Recent Labs  Lab 05/01/21 1113 05/02/21 0638 05/03/21 0453  WBC 25.7* 16.3* 12.2*  NEUTROABS 23.0*  --   --   HGB 9.9* 8.1* 8.0*  HCT 30.2* 25.0* 26.4*  MCV 100.0 102.5* 103.1*  PLT 299 249 907   Basic Metabolic Panel: Recent Labs  Lab 05/01/21 1113 05/02/21 0430 05/03/21 0453  NA 138 139 138  K 4.2 4.3 4.3  CL 103 110 107  CO2 23 21* 24  GLUCOSE 116* 102* 101*  BUN 36* 31* 31*  CREATININE 0.93  0.69 0.73  CALCIUM 9.2 8.7* 8.8*   GFR: Estimated Creatinine Clearance: 66.4 mL/min (by C-G formula based on SCr of 0.73 mg/dL). Liver Function Tests: Recent Labs  Lab 05/01/21 1113  AST 40  ALT 28  ALKPHOS 82  BILITOT 1.2  PROT 5.6*  ALBUMIN 2.9*   No results for input(s): LIPASE, AMYLASE in the last 168 hours. No results for input(s): AMMONIA in the last 168 hours. Coagulation Profile: Recent Labs  Lab 05/01/21 1113 05/02/21 0430  INR 1.3* 1.3*   Cardiac Enzymes: No results for input(s): CKTOTAL, CKMB, CKMBINDEX, TROPONINI in the last 168 hours. BNP (last 3 results) No results for input(s): PROBNP in the last 8760 hours. HbA1C: No results for input(s): HGBA1C in the last 72 hours. CBG: Recent Labs  Lab 05/02/21 0802 05/03/21 0752  GLUCAP 110* 108*   Lipid Profile: No results for input(s): CHOL, HDL, LDLCALC, TRIG, CHOLHDL, LDLDIRECT in the last 72 hours. Thyroid Function Tests: No results for input(s): TSH, T4TOTAL, FREET4, T3FREE, THYROIDAB in the last 72 hours. Anemia Panel: No results for input(s): VITAMINB12, FOLATE, FERRITIN, TIBC, IRON, RETICCTPCT in the last 72 hours. Urine analysis:    Component Value Date/Time   COLORURINE AMBER (A) 05/01/2021 1315   APPEARANCEUR HAZY (A) 05/01/2021 1315   LABSPEC 1.025 05/01/2021 1315   PHURINE 5.0 05/01/2021 1315   GLUCOSEU NEGATIVE 05/01/2021 1315   HGBUR TRACE (A) 05/01/2021 1315   BILIRUBINUR SMALL (A) 05/01/2021 1315   KETONESUR NEGATIVE 05/01/2021 1315   PROTEINUR TRACE (A) 05/01/2021 1315   UROBILINOGEN 1.0 01/13/2013 0934   NITRITE NEGATIVE 05/01/2021 1315   LEUKOCYTESUR NEGATIVE 05/01/2021 1315   Sepsis Labs: _0 (procalcitonin:4,lacticidven:4)  ) Recent Results (from the past 240 hour(s))  Blood Culture (routine x 2)     Status: None (Preliminary result)   Collection Time: 05/01/21 11:16 AM   Specimen: BLOOD RIGHT FOREARM  Result Value Ref Range Status   Specimen Description   Final     BLOOD RIGHT FOREARM BOTTLES DRAWN AEROBIC AND ANAEROBIC   Special Requests Blood Culture adequate volume  Final   Culture   Final    NO GROWTH 2 DAYS Performed at [facility], [address] [place], [place] [address]  Report Status PENDING  Incomplete  Blood Culture (routine x 2)     Status: None (Preliminary result)   Collection Time: 05/01/21 11:17 AM   Specimen: BLOOD LEFT FOREARM  Result Value Ref Range Status   Specimen Description   Final    BLOOD LEFT FOREARM BOTTLES DRAWN AEROBIC AND ANAEROBIC   Special Requests Blood Culture adequate volume  Final   Culture   Final    NO GROWTH 2 DAYS Performed at [facility], [address] [place], [place] [address]    Report Status PENDING  Incomplete  Resp Panel by RT-PCR (Flu A&B, Covid) Nasopharyngeal Swab     Status: None   Collection Time: 05/01/21  1:15 PM   Specimen: Nasopharyngeal Swab; Nasopharyngeal(NP) swabs in vial transport medium  Result Value Ref Range Status   SARS Coronavirus 2 by RT PCR NEGATIVE NEGATIVE Final    Comment: (NOTE) SARS-CoV-2 target nucleic acids are NOT DETECTED.  The SARS-CoV-2 RNA is generally detectable in upper respiratory specimens during the acute phase of infection. The lowest concentration of SARS-CoV-2 viral copies this assay can detect is 138 copies/mL. A negative result does not preclude SARS-Cov-2 infection and should not be used as the sole basis for treatment or other patient management decisions. A negative result may occur with  improper specimen collection/handling, submission of specimen other than nasopharyngeal swab, presence of viral mutation(s) within the areas targeted by this assay, and inadequate number of viral copies(<138 copies/mL). A negative result must be combined with clinical observations, patient history, and epidemiological information. The expected result is Negative.  Fact Sheet for Patients:  EntrepreneurPulse.com.au  Fact Sheet for  Healthcare Providers:  IncredibleEmployment.be  This test is no t yet approved or cleared by the [place] FDA and  has been authorized for detection and/or diagnosis of SARS-CoV-2 by FDA under an Emergency Use Authorization (EUA). This EUA will remain  in effect (meaning this test can be used) for the duration of the COVID-19 declaration under Section 564(b)(1) of the Act, 21 U.S.C.section 360bbb-3(b)(1), unless the authorization is terminated  or revoked sooner.       Influenza A by PCR NEGATIVE NEGATIVE Final   Influenza B by PCR NEGATIVE NEGATIVE Final    Comment: (NOTE) The Xpert Xpress SARS-CoV-2/FLU/RSV plus assay is intended as an aid in the diagnosis of influenza from Nasopharyngeal swab specimens and should not be used as a sole basis for treatment. Nasal washings and aspirates are unacceptable for Xpert Xpress SARS-CoV-2/FLU/RSV testing.  Fact Sheet for Patients: EntrepreneurPulse.com.au  Fact Sheet for Healthcare Providers: IncredibleEmployment.be  This test is not yet approved or cleared by the [place] FDA and has been authorized for detection and/or diagnosis of SARS-CoV-2 by FDA under an Emergency Use Authorization (EUA). This EUA will remain in effect (meaning this test can be used) for the duration of the COVID-19 declaration under Section 564(b)(1) of the Act, 21 U.S.C. section 360bbb-3(b)(1), unless the authorization is terminated or revoked.  Performed at [facility], [address] [place], [place] [address]   Urine Culture     Status: None   Collection Time: 05/01/21  1:15 PM   Specimen: Urine, Clean Catch  Result Value Ref Range Status   Specimen Description   Final    URINE, CLEAN CATCH Performed at [facility], [address] [place], [place] [address]    Special Requests   Final    NONE Performed at [facility], [address] [place], [place] [address]  Culture   Final     NO GROWTH Performed at [facility], [place] [address], [place], [place] [address]    Report Status 05/03/2021 FINAL  Final  MRSA Next Gen by PCR, Nasal     Status: None   Collection Time: 05/02/21  8:03 AM   Specimen: Nasal Mucosa; Nasal Swab  Result Value Ref Range Status   MRSA by PCR Next Gen NOT DETECTED NOT DETECTED Final    Comment: (NOTE) The GeneXpert MRSA Assay (FDA approved for NASAL specimens only), is one component of a comprehensive MRSA colonization surveillance program. It is not intended to diagnose MRSA infection nor to guide or monitor treatment for MRSA infections. Test performance is not FDA approved in patients less than 75 years old. Performed at [facility], [address] [place], [place] [address]       Radiology Studies: DG HIPS BILAT WITH PELVIS MIN 5 VIEWS  Result Date: 05/02/2021 CLINICAL DATA:  Fracture left femur EXAM: DG HIP (WITH OR WITHOUT PELVIS) 5+V BILAT COMPARISON:  Previous studies including the examination of 04/27/2021 FINDINGS: There is previous arthroplasty in both hips. Fracture is seen in the lateral cortical margin of proximal shaft at the level of lesser trochanter. No recent fracture is seen in the right hip. Osteopenia is seen in bony structures. There is evidence of endovascular stent repair of abdominal aorta and iliac arteries. IMPRESSION: Previous bilateral hip arthroplasty. There is recent essentially undisplaced fracture in the lateral aspect of proximal left femur. No fracture or dislocation is seen in the right hip. Electronically Signed   By: Elmer Picker M.D.   On: 05/02/2021 15:33     Scheduled Meds:  acetaminophen  1,000 mg Oral Q8H   amoxicillin-clavulanate  1 tablet Oral Q12H   aspirin  81 mg Oral Daily   calcium carbonate  1,250 mg Oral Q breakfast   Chlorhexidine Gluconate Cloth  6 each Topical Daily   DULoxetine  30 mg Oral Daily   ferrous sulfate  325 mg Oral Q breakfast   heparin  5,000 Units Subcutaneous  Q8H   methylPREDNISolone (SOLU-MEDROL) injection  20 mg Intravenous Q24H   metoprolol tartrate  12.5 mg Oral BID   sodium chloride flush  3 mL Intravenous Q12H   sodium chloride flush  3 mL Intravenous Q12H   sulfaSALAzine  1,000 mg Oral TID   tamsulosin  0.4 mg Oral Daily   traZODone  25 mg Oral QHS   Continuous Infusions:  sodium chloride       LOS: 2 days    Roxan Hockey M.D on 05/03/2021 at 3:06 PM  Go to [email or web address] - for contact info  [facility] - Office  [phone]  If 7PM-7AM, please contact night-coverage [email or web address] Password [facility] 05/03/2021, 3:06 PM

## 2021-05-03 NOTE — Progress Notes (Signed)
Patient very restless and anxious throughout shift. Continuously trying to get out of bed, pulling at lines. Patient also complaining of pain, pain medication provided. Patient provided PRN medications as needed for anxiety and agitation. Patient currently resting comfortably.

## 2021-05-03 NOTE — Progress Notes (Signed)
OT Cancellation Note  Patient Details Name: [name] MRN: [id number] DOB: 1930-04-04   Cancelled Treatment:    Reason Eval/Treat Not Completed: Medical issues which prohibited therapy. Per NT report, PT confused and combative this date. Spoke to wife who was agreeable to attempt evaluation at a later time when pt cognitive status improves. Pt has just been put to bed by nursing staff. Will attempt to evaluate later as time permits.   Eino Whitner OT, MOT   Larey Seat 05/03/2021, 10:08 AM

## 2021-05-04 DIAGNOSIS — I480 Paroxysmal atrial fibrillation: Secondary | ICD-10-CM

## 2021-05-04 LAB — CBC
HCT: 23.7 % — ABNORMAL LOW (ref 39.0–52.0)
Hemoglobin: 7.4 g/dL — ABNORMAL LOW (ref 13.0–17.0)
MCH: 31 pg (ref 26.0–34.0)
MCHC: 31.2 g/dL (ref 30.0–36.0)
MCV: 99.2 fL (ref 80.0–100.0)
Platelets: 324 10*3/uL (ref 150–400)
RBC: 2.39 MIL/uL — ABNORMAL LOW (ref 4.22–5.81)
RDW: 14.1 % (ref 11.5–15.5)
WBC: 10.5 10*3/uL (ref 4.0–10.5)
nRBC: 0.2 % (ref 0.0–0.2)

## 2021-05-04 LAB — BASIC METABOLIC PANEL
Anion gap: 5 (ref 5–15)
BUN: 27 mg/dL — ABNORMAL HIGH (ref 8–23)
CO2: 24 mmol/L (ref 22–32)
Calcium: 8.6 mg/dL — ABNORMAL LOW (ref 8.9–10.3)
Chloride: 108 mmol/L (ref 98–111)
Creatinine, Ser: 0.67 mg/dL (ref 0.61–1.24)
GFR, Estimated: >60 mL/min (ref >60)
Glucose, Bld: 122 mg/dL — ABNORMAL HIGH (ref 70–99)
Potassium: 4.5 mmol/L (ref 3.5–5.1)
Sodium: 137 mmol/L (ref 135–145)

## 2021-05-04 LAB — PREPARE RBC (CROSSMATCH)

## 2021-05-04 LAB — GLUCOSE, CAPILLARY
Glucose-Capillary: 87 mg/dL (ref 70–99)
Glucose-Capillary: 81 mg/dL (ref 70–99)
Glucose-Capillary: 174 mg/dL — ABNORMAL HIGH (ref 70–99)

## 2021-05-04 MED ORDER — TRAZODONE HCL 50 MG PO TABS: 25.0000 mg | ORAL_TABLET | Freq: Every day | ORAL | 2 refills | Status: AC

## 2021-05-04 MED ORDER — FUROSEMIDE 10 MG/ML IJ SOLN
20.0000 mg | Freq: Once | INTRAMUSCULAR | Status: AC
  Administered 2021-05-04: 20 mg via INTRAVENOUS
  Filled 2021-05-04: qty 2

## 2021-05-04 MED ORDER — TRAMADOL HCL 50 MG PO TABS: 50.0000 mg | ORAL_TABLET | Freq: Four times a day (QID) | ORAL | 0 refills | Status: DC | PRN

## 2021-05-04 MED ORDER — ACETAMINOPHEN 325 MG PO TABS: 650.0000 mg | ORAL_TABLET | ORAL | 2 refills | Status: AC | PRN

## 2021-05-04 MED ORDER — SODIUM CHLORIDE 0.9% IV SOLUTION: Freq: Once | INTRAVENOUS | Status: AC

## 2021-05-04 MED ORDER — AMOXICILLIN-POT CLAVULANATE 875-125 MG PO TABS: 1.0000 | ORAL_TABLET | Freq: Two times a day (BID) | ORAL | 0 refills | Status: AC

## 2021-05-04 MED ORDER — SENNOSIDES-DOCUSATE SODIUM 8.6-50 MG PO TABS
2.0000 | ORAL_TABLET | Freq: Every day | ORAL | 11 refills | Status: AC
Start: 2021-05-04 — End: 2022-05-04

## 2021-05-04 MED ORDER — BISACODYL 10 MG RE SUPP: 10.0000 mg | RECTAL | 0 refills | Status: AC

## 2021-05-04 MED ORDER — ASPIRIN 81 MG PO TABS: 81.0000 mg | ORAL_TABLET | Freq: Every day | ORAL | 3 refills | Status: AC

## 2021-05-04 MED ORDER — PREDNISONE 10 MG PO TABS: 10.0000 mg | ORAL_TABLET | Freq: Every day | ORAL | 0 refills | Status: AC

## 2021-05-04 MED ORDER — IPRATROPIUM BROMIDE 0.02 % IN SOLN: 0.5000 mg | Freq: Four times a day (QID) | RESPIRATORY_TRACT | 12 refills | Status: AC | PRN

## 2021-05-04 NOTE — Plan of Care (Signed)
°  Problem: Acute Rehab OT Goals (only OT should resolve) Goal: Pt. Will Perform Grooming Flowsheets (Taken 05/04/2021 0950) Pt Will Perform Grooming:  sitting  with set-up Goal: Pt. Will Perform Upper Body Dressing Flowsheets (Taken 05/04/2021 0950) Pt Will Perform Upper Body Dressing:  with supervision  sitting Goal: Pt. Will Perform Lower Body Dressing Flowsheets (Taken 05/04/2021 0950) Pt Will Perform Lower Body Dressing:  with mod assist  sitting/lateral leans  with adaptive equipment Goal: Pt. Will Transfer To Toilet Flowsheets (Taken 05/04/2021 0950) Pt Will Transfer to Toilet:  with min guard assist  with min assist  stand pivot transfer Goal: Pt/Caregiver Will Perform Home Exercise Program Flowsheets (Taken 05/04/2021 0950) Pt/caregiver will Perform Home Exercise Program:  Increased strength  Both right and left upper extremity  With Supervision  Jazzlyn Huizenga OT, MOT

## 2021-05-04 NOTE — TOC Transition Note (Addendum)
Transition of Care [facility]) - CM/SW Discharge Note   Patient Details  Name: [name] MRN: [id number] Date of Birth: 28-Oct-1930  Transition of Care [facility]) CM/SW Contact:  Boneta Lucks, RN Phone Number: 05/04/2021, 10:08 AM   Clinical Narrative:   Insurance auth received for [employer]. RN to call report. Patient will continue palliative discussion at [facility]. DC summary sent in hub.   Final next level of care: Skilled Nursing Facility Barriers to Discharge: Barriers Resolved   Patient Goals and CMS Choice Patient states their goals for this hospitalization and ongoing recovery are:: return to SNF   Choice offered to / list presented to : Spouse  Discharge Placement          Patient to be transferred to facility by: [facility] Staff   Patient and family notified of of transfer: 05/04/21  Discharge Plan and Services In-house Referral: Clinical Social Work   Post Acute Care Choice: Resumption of Svcs/PTA Provider              Readmission Risk Interventions Readmission Risk Prevention Plan 05/04/2021 05/02/2021  Transportation Screening - Complete  HRI or Home Care Consult Complete -  Social Work Consult for [place] Planning/Counseling Complete -  Palliative Care Screening Complete -  Medication Review Press photographer) Complete Complete  HRI or Home Care Consult - Complete  SW Recovery Care/Counseling Consult - Complete  Palliative Care Screening - Not Applicable  Skilled Nursing Facility - Complete  Some recent data might be hidden

## 2021-05-04 NOTE — Progress Notes (Signed)
Palliative:  HPI: 88 y.o. male  with past medical history of dementia, atrial fibrillation, CAD, hypertension, MI, pulmonary fibrosis on 2L chronically, AAA, anemia, rheumatoid arthritis admitted on 05/01/2021 with fever, shortness of breath from [facility] where he was on cefdinir and doxycycline for possible pneumonia. Admitted with sepsis pneumonia vs UTI. Of note he was recently hospitalized 1/30-04/28/21 for fall at home with resultant L hip fracture with decision that fracture was not amendable for surgical repair and he was sent to SNF rehab. Family have noted that he had a fall at [facility] rehab 04/30/21.      I met today at [name] bedside. He is in recliner and is less agitated than he was yesterday. Wife, [name], is at bedside and she is pleased to see him more calm than yesterday. Plans to return to [facility] today. We discussed ongoing palliative care support and she agrees this would be helpful. [name] is hopeful that he can cooperate with therapy and regain function and walking again but she does acknowledge that his progressing dementia may be a barrier to his progress. She would like to continue to give him every opportunity to improve and do as well as possible. She also wants to ensure that he is kept safe and that his pain and agitation is controlled - this is very important. We did discuss that sometimes with dementia it is not possible to find a regimen that will consistently and adequately manage his symptoms and agitation and often at some point we come to a place of having to decide what is most important to [name] care at the time. [name] does comment that she "does not want to play God" and I reassured her that God has the final say regardless of what we do or don't do. We would mainly be deciding how to best care for him for whatever time is left.   Dr. Denton Brick called and discussed potential for blood transfusion. Discussed with [name] if she wishes to proceed or monitor  and after our discussion she wishes to proceed with blood transfusion with hopes that this will assist with his energy and strength to participate with therapy and avoid another admission if possible. She understands the plans to transfer to [facility] likely later today after blood transfusion.   All questions/concerns addressed. Emotional support provided.   Exam: Alert, confused. More cooperative today compared to yesterday. No distress. He continues to be restless at times in recliner and changing position but not trying to stand or get out of chair during my visit. HR stable with noted PVCs. Breathing regular, unlabored. Abd flat.   Plan: - Ongoing goals of care discussions and support with wife [name]. Outpatient palliative to continue conversations and assist with symptom management.  - Pain: Tylenol 1000 mg every 8 hours scheduled. PRN tramadol for moderate pain. Pain very likely contributing to agitation at times.  - Agitation: Trazodone 25 mg qhs - may increase dose as needed. Haldol 2 mg po every 6 hours PRN - may consider scheduled doses if needed. - May leave off brace if he is in bed and not attempting to stand or walk as discussed with Dr. Denton Brick.   [place], NP Palliative Medicine Team Pager [phone] (Please see [email or web address] for schedule) Team Phone [phone]    Greater than 50%  of this time was spent counseling and coordinating care related to the above assessment and plan

## 2021-05-04 NOTE — Progress Notes (Signed)
Physical Therapy Treatment Patient Details Name: [name] MRN: [id number] DOB: [birth year] Today's Date: 05/04/2021   History of Present Illness [name] is a 88 year old male chronic respiratory failure COPD/pulmonary fibrosis 2 L dependent, CAD, MI, HTN, HLD, RA,AAA, dementia,      Presenting from [facility], SNF concern for fever.  Patient was recently hospitalized at [facility] from 1 32-2 after mechanical fall sustaining hip fracture.  It was determined that his proximal femur fracture was not amendable to ORIF therefore was placed in a hip brace on the right side.  And was sent for rehab.  Patient has not been anticoagulated due to dementia and falls.  He has chronic respiratory failure due to pulmonary fibrosis on 2 L of supplemental oxygen continuously.  He has been empirically on cefdinir and doxycycline for empiric treatment of possible pneumonia at the facility.    PT Comments    Patient presents in bed without abduction brace on, required Max assist to don abduction brace, demonstrates fair/good return for scooting to EOB with repeated verbal/tactile cueing, very unsteady on feet with difficulty maintaining 50% PWB on LLE and limited to a few side steps before having to sit due to fatigue/generalized weakness.  Patient tolerated sitting up in chair after therapy with family members present in room - RN aware.  Patient will benefit from continued skilled physical therapy in hospital and recommended venue below to increase strength, balance, endurance for safe ADLs and gait.     Recommendations for follow up therapy are one component of a multi-disciplinary discharge planning process, led by the attending physician.  Recommendations may be updated based on patient status, additional functional criteria and insurance authorization.  Follow Up Recommendations  Skilled nursing-short term rehab (<3 hours/day)     Assistance Recommended at Discharge Intermittent  Supervision/Assistance  Patient can return home with the following A lot of help with bathing/dressing/bathroom;A lot of help with walking and/or transfers;Help with stairs or ramp for entrance;Assistance with cooking/housework   Equipment Recommendations  None recommended by PT    Recommendations for Other Services       Precautions / Restrictions Precautions Precautions: Fall Required Braces or Orthoses: Other Brace Other Brace: Abduction brace right hip Restrictions Weight Bearing Restrictions: Yes LLE Weight Bearing: Partial weight bearing LLE Partial Weight Bearing Percentage or Pounds: 50% WB LLE     Mobility  Bed Mobility               General bed mobility comments: as per OT notes    Transfers                   General transfer comment: as per OT notes    Ambulation/Gait Ambulation/Gait assistance: Mod assist, Max assist Gait Distance (Feet): 5 Feet Assistive device: Rolling walker (2 wheels) Gait Pattern/deviations: Decreased step length - right, Decreased step length - left, Decreased stride length, Antalgic Gait velocity: decreased     General Gait Details: limited to a few slow labored unsteady side steps with fair/poor carryover for maintaining 50%, limited mostly due to left hip pain and fatigue   Stairs             Wheelchair Mobility    Modified Rankin (Stroke Patients Only)       Balance Overall balance assessment: Needs assistance Sitting-balance support: Feet supported, No upper extremity supported Sitting balance-Leahy Scale: Fair Sitting balance - Comments: fair/good seated at EOB   Standing balance support: Reliant on assistive device for  balance, During functional activity, Bilateral upper extremity supported Standing balance-Leahy Scale: Poor Standing balance comment: fair/poor using RW                            Cognition Arousal/Alertness: Awake/alert Behavior During Therapy: WFL for tasks  assessed/performed Overall Cognitive Status: History of cognitive impairments - at baseline                                          Exercises General Exercises - Lower Extremity Ankle Circles/Pumps: Seated, AROM, Strengthening, Both, 10 reps Long Arc Quad: Seated, AROM, Strengthening, 10 reps, Both Hip Flexion/Marching: Seated, AROM, Strengthening, Left, 10 reps    General Comments        Pertinent Vitals/Pain Pain Assessment Pain Assessment: Faces Faces Pain Scale: Hurts little more Pain Location: left hip Pain Descriptors / Indicators: Sore Pain Intervention(s): Limited activity within patient's tolerance, Monitored during session, Repositioned    Home Living Family/patient expects to be discharged to:: Private residence Living Arrangements: Spouse/significant other Available Help at Discharge: Family;Available 24 hours/day Type of Home: House Home Access: Stairs to enter Entrance Stairs-Rails: Left Entrance Stairs-Number of Steps: 3   Home Layout: One level Home Equipment: Conservation officer, nature (2 wheels);Wheelchair - manual;Cane - single point      Prior Function            PT Goals (current goals can now be found in the care plan section) Acute Rehab PT Goals Patient Stated Goal: return home after rehab PT Goal Formulation: With patient/family Time For Goal Achievement: 05/16/21 Potential to Achieve Goals: Good Progress towards PT goals: Progressing toward goals    Frequency    Min 3X/week      PT Plan      Co-evaluation PT/OT/SLP Co-Evaluation/Treatment: Yes Reason for Co-Treatment: Complexity of the patient's impairments (multi-system involvement);To address functional/ADL transfers PT goals addressed during session: Mobility/safety with mobility;Balance;Proper use of DME;Strengthening/ROM OT goals addressed during session: ADL's and self-care      AM-PAC PT "6 Clicks" Mobility   Outcome Measure  Help needed turning from your back  to your side while in a flat bed without using bedrails?: A Lot Help needed moving from lying on your back to sitting on the side of a flat bed without using bedrails?: A Lot Help needed moving to and from a bed to a chair (including a wheelchair)?: A Lot Help needed standing up from a chair using your arms (e.g., wheelchair or bedside chair)?: A Lot Help needed to walk in hospital room?: A Lot Help needed climbing 3-5 steps with a railing? : Total 6 Click Score: 11    End of Session Equipment Utilized During Treatment: Oxygen Activity Tolerance: Patient tolerated treatment well;Patient limited by fatigue Patient left: in chair;with call bell/phone within reach;with family/visitor present Nurse Communication: Mobility status PT Visit Diagnosis: Unsteadiness on feet (R26.81);Other abnormalities of gait and mobility (R26.89);Muscle weakness (generalized) (M62.81)     Time: 0947-0962 PT Time Calculation (min) (ACUTE ONLY): 30 min  Charges:  $Therapeutic Exercise: 8-22 mins $Therapeutic Activity: 8-22 mins                     12:21 PM, 05/04/21 Lonell Grandchild, MPT Physical Therapist with [facility] [address] [phone] office [phone] mobile phone

## 2021-05-04 NOTE — Care Management Important Message (Signed)
Important Message  Patient Details  Name: [name] MRN: [id number] Date of Birth: July 12, 1930   Medicare Important Message Given:  Yes     [name] 05/04/2021, 11:40 AM

## 2021-05-04 NOTE — Evaluation (Signed)
Occupational Therapy Evaluation Patient Details Name: [name] MRN: [id number] DOB: 25-Apr-1930 Today's Date: 05/04/2021   History of Present Illness [name] is a 88 year old male chronic respiratory failure COPD/pulmonary fibrosis 2 L dependent, CAD, MI, HTN, HLD, RA,AAA, dementia,      Presenting from [facility], SNF concern for fever.  Patient was recently hospitalized at [facility] from 1 32-2 after mechanical fall sustaining hip fracture.  It was determined that his proximal femur fracture was not amendable to ORIF therefore was placed in a hip brace on the right side.  And was sent for rehab.  Patient has not been anticoagulated due to dementia and falls.  He has chronic respiratory failure due to pulmonary fibrosis on 2 L of supplemental oxygen continuously.  He has been empirically on cefdinir and doxycycline for empiric treatment of possible pneumonia at the facility.   Clinical Impression   Pt agreeable to OT evaluation with PT treating. Pt able to follow commands demonstrating need for moderate assist for bed mobility and stand pivot transfer to chair from EOB. Pt generally weak donning abduction brace for R hip. Pt assisted for ADL's at baseline. Pt generally weak in B UE. Pt donned 2 L supplemental O2 during session today with minimal desaturation below 90% with quick return. Pt will benefit from continued OT in the hospital and recommended venue below to increase strength, balance, and endurance for safe ADL's.        Recommendations for follow up therapy are one component of a multi-disciplinary discharge planning process, led by the attending physician.  Recommendations may be updated based on patient status, additional functional criteria and insurance authorization.   Follow Up Recommendations  Skilled nursing-short term rehab (<3 hours/day)    Assistance Recommended at Discharge Frequent or constant Supervision/Assistance  Patient can return home with the  following A lot of help with walking and/or transfers;A lot of help with bathing/dressing/bathroom;Direct supervision/assist for medications management;Direct supervision/assist for financial management;Assistance with cooking/housework;Assist for transportation;Help with stairs or ramp for entrance    Functional Status Assessment  Patient has had a recent decline in their functional status and demonstrates the ability to make significant improvements in function in a reasonable and predictable amount of time.  Equipment Recommendations  None recommended by OT    Recommendations for Other Services       Precautions / Restrictions Precautions Precautions: Fall Required Braces or Orthoses: Other Brace Other Brace: Abduction brace right hip Restrictions Weight Bearing Restrictions: Yes LLE Weight Bearing: Partial weight bearing (50% WB) LLE Partial Weight Bearing Percentage or Pounds: 50%      Mobility Bed Mobility Overal bed mobility: Needs Assistance Bed Mobility: Supine to Sit     Supine to sit: Mod assist     General bed mobility comments: increased time, labored movement, Mod assist to move BLE    Transfers Overall transfer level: Needs assistance Equipment used: Rolling walker (2 wheels) Transfers: Sit to/from Stand, Bed to chair/wheelchair/BSC Sit to Stand: Mod assist     Step pivot transfers: Mod assist     General transfer comment: slow labored movement      Balance Overall balance assessment: Needs assistance Sitting-balance support: Feet supported, No upper extremity supported Sitting balance-Leahy Scale: Fair Sitting balance - Comments: fair/good seated at EOB   Standing balance support: Reliant on assistive device for balance, During functional activity, Bilateral upper extremity supported Standing balance-Leahy Scale: Poor Standing balance comment: fair/poor using RW  ADL either performed or assessed with  clinical judgement   ADL Overall ADL's : Needs assistance/impaired     Grooming: Set up;Sitting;Supervision/safety   Upper Body Bathing: Minimal assistance;Sitting   Lower Body Bathing: Maximal assistance;Sitting/lateral leans       Lower Body Dressing: Maximal assistance;Sitting/lateral leans   Toilet Transfer: Moderate assistance;Rolling walker (2 wheels);Stand-pivot Armed forces technical officer Details (indicate cue type and reason): Simulated via EOB to chair transfer with RW.         Functional mobility during ADLs: Maximal assistance;Rolling walker (2 wheels) General ADL Comments: Slow albored movement in standing. WFL functional A/ROM but limited by brace and L LE pain.     Vision Baseline Vision/History: 1 Wears glasses Ability to See in Adequate Light: 1 Impaired Patient Visual Report: Diplopia (Pt reported previous double vision that has resolved.) Vision Assessment?: Yes Tracking/Visual Pursuits: Other (comment) (Pt noted to have difficulty dissociating head from eye movements.)                Pertinent Vitals/Pain Pain Assessment Pain Assessment: No/denies pain     Hand Dominance Right   Extremity/Trunk Assessment Upper Extremity Assessment Upper Extremity Assessment: Generalized weakness   Lower Extremity Assessment Lower Extremity Assessment: Defer to PT evaluation   Cervical / Trunk Assessment Cervical / Trunk Assessment: Normal   Communication Communication Communication: No difficulties   Cognition Arousal/Alertness: Awake/alert Behavior During Therapy: WFL for tasks assessed/performed Overall Cognitive Status: History of cognitive impairments - at baseline                                                        [name] expects to be discharged to:: Private residence Living Arrangements: Spouse/significant other Available Help at Discharge: Family;Available 24 hours/day Type of Home: House Home Access:  Stairs to enter [employer] of Steps: 3 Entrance Stairs-Rails: Left Home Layout: One level     Bathroom Shower/Tub: Occupational psychologist: Handicapped height Bathroom Accessibility: Yes   Home Equipment: Conservation officer, nature (2 wheels);Wheelchair - manual;Cane - single point          Prior Functioning/Environment Prior Level of Function : Independent/Modified Independent             Mobility Comments: Short distanced community ambulator using SPC ADLs Comments: Assited by family for all ADL's and IADL's.        OT Problem List: Decreased strength;Decreased activity tolerance;Impaired balance (sitting and/or standing)      OT Treatment/Interventions: Self-care/ADL training;Therapeutic exercise;Therapeutic activities;Balance training;Patient/family education    OT Goals(Current goals can be found in the care plan section) Acute Rehab OT Goals Patient Stated Goal: Return to rehab OT Goal Formulation: With patient/family Time For Goal Achievement: 05/18/21 Potential to Achieve Goals: Fair  OT Frequency: Min 2X/week    Co-evaluation PT/OT/SLP Co-Evaluation/Treatment: Yes Reason for Co-Treatment: Complexity of the patient's impairments (multi-system involvement)   OT goals addressed during session: ADL's and self-care                       End of Session Equipment Utilized During Treatment: Rolling walker (2 wheels);Oxygen  Activity Tolerance: Patient tolerated treatment well Patient left: in chair;with call bell/phone within reach;with family/visitor present  OT Visit Diagnosis: Unsteadiness on feet (R26.81);Other abnormalities of gait and mobility (R26.89);Muscle weakness (generalized) (M62.81)  Time: 8309-4076 OT Time Calculation (min): 23 min Charges:  OT General Charges $OT Visit: 1 Visit OT Evaluation $OT Eval Moderate Complexity: 1 Mod  Rosealynn Mateus OT, MOT  Larey Seat 05/04/2021, 9:47 AM

## 2021-05-04 NOTE — Discharge Instructions (Signed)
1)Palliative care consult/follow up at SNF advised 2)Repeat CBC and BMP on Monday 05/09/21

## 2021-05-04 NOTE — Discharge Summary (Signed)
Physician Discharge Summary   Patient: [name] MRN: [id number] DOB: [birth year]  Admit date:     05/01/2021  Discharge date: 05/04/21  Discharge Physician: Roxan Hockey   PCP: Lajean Manes, MD   Recommendations at discharge:  - Repeat CBC on Monday, 05/09/2021  Discharge Diagnoses: Principal Problem:   Sepsis [facility]) Active Problems:   Vascular dementia without behavioral disturbance ([place])   Pulmonary fibrosis assoc with RA    CAD (coronary artery disease), native coronary artery   Paroxysmal atrial fibrillation (HCC)   Essential hypertension   Abdominal aortic aneurysm without rupture   Closed left hip fracture (HCC)   Rheumatoid arteritis (HCC)   Chronic respiratory failure with hypoxia (HCC)   Chronic diastolic (congestive) heart failure (HCC)   Iron deficiency anemia due to chronic blood loss  Resolved Problems:   Hyperlipidemia   Dislocation of hip prosthesis (HCC)   SIRS (systemic inflammatory response syndrome) [facility])   Hospital Course: [name] is a 88 year old male chronic respiratory failure COPD/pulmonary fibrosis 2 L dependent, CAD, MI, HTN, HLD, RA,AAA, dementia,   Presenting from [facility], SNF concern for fever.  Patient was recently hospitalized at [facility] from 1 32-2 after mechanical fall sustaining hip fracture. It was determined that his proximal femur fracture was not amendable to ORIF therefore was placed in a hip brace on the right side.  And was sent for rehab. Patient has not been anticoagulated due to dementia and falls.  He has chronic respiratory failure due to pulmonary fibrosis on 2 L of supplemental oxygen continuously. He has been empirically on cefdinir and doxycycline for empiric treatment of possible pneumonia at the facility.  ED: Upon arrival Temp: 100.7, RR 28 BP 96/54, WBC of 25.7 UA amber, trace of hemoglobin, protein, few bacteria WBC CTA: Negative for any PE chronic residual lung disease, pulmonary fibrosis,  interstitial lung disease, pneumonia could not do review abnormal esophageal findings seems to be chronic with hiatal hernia, mediastinal lymphadenopathy mild, possible mild pulmonary hypertension, aortic and coronary atherosclerotic disease   -Cultures were obtained, patient was started on broad-spectrum antibiotics of cefepime and vancomycin  Assessment and Plan: * Sepsis ([place])- (present on admission) -Sepsis - POA -suspect aspiration pneumonia related  -Leukocytosis resolved, overall sepsis pathophysiology resolved -Urine and blood cultures NGTD -- CT angiogram reviewed-chronic pulmonary fibrosis, ?? Pneumonia, also noted chronic diffuse esophageal changes possible strictures -Treated with Vancomycin, Cefepime and Flagyl -Discussed with ID Physician Dr Wendie Agreste, he recommends De-escalating antibiotics to Augmentin Monotherapy starting on 05/03/21 for 5 additional days - respiratory status appears stable -Patient with chronic hypoxic respiratory failure, appears to be at baseline at this time, C/n supplemental oxygen at 2 L/min continuously which is patient's baseline  Vascular dementia without behavioral disturbance ([place])- (present on admission) -Advanced Dementia with significant Cognitive and Memory Deficits -Continue supportive therapy, reorientation -Continue home Cymbalta, trazodone nightly   Pulmonary fibrosis assoc with RA - (present on admission) - Severe pulmonary fibrosis//COPD -Patient with history of heavy tobacco use previously -PTA on home O2 at 2 L chronically -Also chronically on steroids more for rheumatoid arthritis than pulmonary issues -Continue PTA bronchodilators, steroids  C/n supplemental oxygen at 2 L/min continuously which is patient's baseline  Iron deficiency anemia due to chronic blood loss- (present on admission) - We will continue iron supplements -Advised transfusion of 4 units of PRBC on 05/04/2021 and repeat CBC on Monday, 05/09/2021  Chronic  diastolic (congestive) heart failure ([place])- (present on admission) -Compensated, no evidence of  CHF exacerbation at this time, Last Echo reviewed from 05/12/2020: EJf: 45 to 50%. The left ventricle has mildly decreased function. The left ventricle demonstrates global hypokinesis. There is mild concentric left ventricular hypertrophy. Left ventricular diastolic  parameters are consistent with Grade I diastolic dysfunction (impaired relaxation   Chronic respiratory failure with hypoxia (HCC)- (present on admission) - Severe pulmonary fibrosis//COPD -Patient with history of heavy tobacco use previously -PTA on home O2 at 2 L chronically -Also chronically on steroids more for rheumatoid arthritis than pulmonary issues -Continue PTA bronchodilators, steroids -Patient with chronic hypoxic respiratory failure, appears to be at baseline at this time, C/n supplemental oxygen at 2 L/min continuously which is patient's baseline   Rheumatoid arteritis ([place])- (present on admission) - Stable as needed analgesics, Hold PTA hydroxychloroquine C/n Steroids ( pt chronically on steroids PTA)  Closed left hip fracture ([place])- (present on admission) - Apparently patient had a spontaneous left hip fracture, was not a candidate for ORIF due to chronic advanced lung disease, pulmonary fibrosis -Currently in a Rt Hip/Thigh brace--to be used when upright and/or ambulating -May use a wedge pillow when in bed -Continue physical therapy with 50% weightbearing on the left, hopefully patient will have spontaneous healing -Plan to transfer back to SNF for ongoing rehab, allowed to partially bear weight by orthopedic team  Abdominal aortic aneurysm without rupture- (present on admission) - Stable -Outpatient monitoring/repeat imaging as previously advised  Essential hypertension- (present on admission) - Remained stable, continue current medication including metoprolol   Paroxysmal atrial fibrillation ([place])-  (present on admission) -Elevated [place] Vasc score -Risk versus benefits of full anticoagulation discussed with family, no full anticoagulation at this time --Continue aspirin -On metoprolol for rate control,  CAD (coronary artery disease), native coronary artery- (present on admission) - Stable continue home medication of aspirin,  Metoprolol -Discussed with wife and granddaughter advised to discontinue Lipitor (Risk Versus benefits reviewed)  Consultants: ID/Palliative Procedures performed:   Disposition: Skilled nursing facility Diet recommendation:  Discharge Diet Orders (From admission, onward)     Start     Ordered   05/04/21 0000  Diet - low sodium heart healthy        05/04/21 1032           Regular diet  DISCHARGE MEDICATION: Allergies as of 05/04/2021       Reactions   Morphine Other (See Comments)   "makes me crazy"   Morphine And Related Nausea And Vomiting, Other (See Comments)   Hallucinations    Nirmatrelvir-ritonavir Other (See Comments)   Other reaction(s): confusion        Medication List     STOP taking these medications    atorvastatin 40 MG tablet Commonly known as: LIPITOR   doxycycline 100 MG capsule Commonly known as: VIBRAMYCIN   famotidine 40 MG tablet Commonly known as: PEPCID   Fish Oil 1000 MG Caps   hydroxychloroquine 200 MG tablet Commonly known as: PLAQUENIL   melatonin 5 MG Tabs   OCUVITE PRESERVISION PO   Vitamin C 500 MG Caps   Zinc 50 MG Tabs       TAKE these medications    acetaminophen 325 MG tablet Commonly known as: Tylenol Take 2 tablets (650 mg total) by mouth every 4 (four) hours as needed. What changed:  medication strength how much to take when to take this reasons to take this   amoxicillin-clavulanate 875-125 MG tablet Commonly known as: AUGMENTIN Take 1 tablet by mouth 2 (two) times daily for  5 days.   aspirin 81 MG tablet Take 1 tablet (81 mg total) by mouth daily with breakfast. What  changed: when to take this   bisacodyl 10 MG suppository Commonly known as: Dulcolax Place 1 suppository (10 mg total) rectally See admin instructions. Every Monday and Friday only   calcium carbonate 600 MG Tabs tablet Commonly known as: OS-CAL Take 600 mg by mouth daily.   carboxymethylcellulose 0.5 % Soln Commonly known as: REFRESH PLUS 1 drop 3 (three) times daily as needed (dry eyes).   DULoxetine 30 MG capsule Commonly known as: CYMBALTA Take 30 mg by mouth daily.   ferrous sulfate 325 (65 FE) MG tablet Take 325 mg by mouth daily with breakfast.   ipratropium 0.02 % nebulizer solution Commonly known as: ATROVENT Take 2.5 mLs (0.5 mg total) by nebulization every 6 (six) hours as needed for wheezing or shortness of breath.   metoprolol tartrate 25 MG tablet Commonly known as: LOPRESSOR Take 0.5 tablets (12.5 mg total) by mouth 2 (two) times daily.   multivitamin capsule Take 1 capsule by mouth daily.   predniSONE 5 MG tablet Commonly known as: DELTASONE Take 5 mg by mouth daily. What changed: Another medication with the same name was added. Make sure you understand how and when to take each.   predniSONE 10 MG tablet Commonly known as: DELTASONE Take 1 tablet (10 mg total) by mouth daily with breakfast for 5 days. Then go back to 5 mg daily indefinitely after 5 days  of 10 mg daily What changed: You were already taking a medication with the same name, and this prescription was added. Make sure you understand how and when to take each.   senna-docusate 8.6-50 MG tablet Commonly known as: Senokot-S Take 2 tablets by mouth at bedtime.   sulfaSALAzine 500 MG tablet Commonly known as: AZULFIDINE Take 500 mg by mouth in the morning, at noon, in the evening, and at bedtime.   tamsulosin 0.4 MG Caps capsule Commonly known as: FLOMAX Take 0.4 mg by mouth daily.   traMADol 50 MG tablet Commonly known as: ULTRAM Take 1 tablet (50 mg total) by mouth every 6 (six) hours  as needed for moderate pain.   traZODone 50 MG tablet Commonly known as: DESYREL Take 0.5 tablets (25 mg total) by mouth at bedtime.   Vitamin D (Ergocalciferol) 1.25 MG (50000 UNIT) Caps capsule Commonly known as: DRISDOL Take 50,000 Units by mouth every 7 (seven) days. Sundays               Discharge Care Instructions  (From admission, onward)           Start     Ordered   05/04/21 0000  Discharge wound care:       Comments: As advised   05/04/21 1032            Contact information for after-discharge care     [facility] .   Service: Skilled Nursing Contact information: [address] [place] [phone]                     Discharge Exam: Danley Danker Weights   05/02/21 0123 05/03/21 0500 05/04/21 0500  Weight: 73.5 kg 73.5 kg 72.9 kg     Condition at discharge: good  The results of significant diagnostics from this hospitalization (including imaging, microbiology, ancillary and laboratory) are listed below for reference.   Imaging Studies:  CT Angio Chest PE W and/or Wo Contrast  Result Date: 05/01/2021 CLINICAL DATA:  Fever, shortness of breath and history pulmonary fibrosis with oxygen dependence. EXAM: CT ANGIOGRAPHY CHEST WITH CONTRAST TECHNIQUE: Multidetector CT imaging of the chest was performed using the standard protocol during bolus administration of intravenous contrast. Multiplanar CT image reconstructions and MIPs were obtained to evaluate the vascular anatomy. RADIATION DOSE REDUCTION: This exam was performed according to the departmental dose-optimization program which includes automated exposure control, adjustment of the mA and/or kV according to patient size and/or use of iterative reconstruction technique. CONTRAST:  91mL OMNIPAQUE IOHEXOL 350 MG/ML SOLN COMPARISON:  CT of the chest without contrast on 04/10/2014 FINDINGS: Cardiovascular: The pulmonary  arteries are adequately opacified. There is no evidence of acute pulmonary embolism. Mild central pulmonary artery dilatation with the main pulmonary artery measuring up to 3 cm is consistent with probable mild underlying pulmonary hypertension. The heart is mildly enlarged. Atherosclerosis of the thoracic aorta and calcification at the level of the aortic valve. No evidence of thoracic aortic aneurysm. Calcified coronary artery plaque present. No pericardial fluid. Mediastinum/Nodes: The esophagus is diffusely thickened and contains some fluid and debris with evidence of probable fundoplication and/or hiatal hernia repair in the past. Underlying esophageal pathology is not excluded including neoplasm or stricture. Compared to the prior CT, there is increased lymph node prominence in the mediastinum and right hilar region. Subcarinal lymph node tissue is ill-defined but measures roughly 2 cm in short axis. Next largest mediastinal lymph node is an AP window node measuring roughly 1.3 cm in short axis. Mildly prominent right hilar lymph node tissue. All of these lymph nodes may relate to reactive lymphadenopathy secondary to progressive lung disease. Lungs/Pleura: Significant progressive pulmonary fibrosis in both lungs as well as severe underlying emphysematous disease. In addition to fibrotic lung disease and cystic fibrotic lung disease there are areas of airspace disease per particularly in the right lower lobe and left lung base. Underlying pneumonia would be difficult to exclude. Correlation also suggested with the possibility of chronic aspiration given the esophageal abnormalities described above. No evidence of pleural fluid or pneumothorax. Upper Abdomen: No acute abnormality. Musculoskeletal: No chest wall abnormality. No acute or significant osseous findings. Review of the MIP images confirms the above findings. IMPRESSION: 1. No evidence of acute pulmonary embolism. 2. Abnormal esophagus by CT with  diffuse thickening and fluid and debris within the esophagus. There is evidence of prior fundoplication/hiatal hernia repair. Underlying esophageal pathology is not excluded including neoplasm or stricture. 3. Progressive severe pulmonary fibrosis in both lungs as well as areas of airspace disease particularly in the lower lobes, right greater than left. Underlying pneumonia would be difficult to exclude. Correlation suggested with the possibility of chronic aspiration given esophageal abnormalities. 4. Mildly prominent mediastinal and right hilar lymph nodes may be reactive lymphadenopathy secondary to progressive lung disease. 5. Mildly dilated central pulmonary arteries are suggestive of some degree of underlying pulmonary hypertension. 6. Aortic and coronary atherosclerosis. Aortic Atherosclerosis (ICD10-I70.0) and Emphysema (ICD10-J43.9). Electronically Signed   By: Aletta Edouard M.D.   On: 05/01/2021 12:41   DG Chest Port 1 View  Result Date: 05/01/2021 CLINICAL DATA:  Possible sepsis. EXAM: PORTABLE CHEST 1 VIEW COMPARISON:  04/26/2021 FINDINGS: 1106 hours. Similar appearance of the diffuse peripherally predominant interstitial and patchy airspace opacity. No evidence of pleural effusion. The cardio pericardial silhouette is enlarged. The visualized bony structures of the thorax show no acute abnormality. Telemetry leads overlie the chest. IMPRESSION:  Similar appearance of the diffuse peripherally predominant interstitial and patchy airspace opacity suggesting underlying chronic lung disease. Electronically Signed   By: Misty Stanley M.D.   On: 05/01/2021 11:20   DG HIPS BILAT WITH PELVIS MIN 5 VIEWS  Result Date: 05/02/2021 CLINICAL DATA:  Fracture left femur EXAM: DG HIP (WITH OR WITHOUT PELVIS) 5+V BILAT COMPARISON:  Previous studies including the examination of 04/27/2021 FINDINGS: There is previous arthroplasty in both hips. Fracture is seen in the lateral cortical margin of proximal shaft at the  level of lesser trochanter. No recent fracture is seen in the right hip. Osteopenia is seen in bony structures. There is evidence of endovascular stent repair of abdominal aorta and iliac arteries. IMPRESSION: Previous bilateral hip arthroplasty. There is recent essentially undisplaced fracture in the lateral aspect of proximal left femur. No fracture or dislocation is seen in the right hip. Electronically Signed   By: Elmer Picker M.D.   On: 05/02/2021 15:33    Microbiology: Results for orders placed or performed during the hospital encounter of 05/01/21  Blood Culture (routine x 2)     Status: None (Preliminary result)   Collection Time: 05/01/21 11:16 AM   Specimen: BLOOD RIGHT FOREARM  Result Value Ref Range Status   Specimen Description   Final    BLOOD RIGHT FOREARM BOTTLES DRAWN AEROBIC AND ANAEROBIC   Special Requests Blood Culture adequate volume  Final   Culture   Final    NO GROWTH 2 DAYS Performed at [facility], [place], [address], [place], [place] [address]    Report Status PENDING  Incomplete  Blood Culture (routine x 2)     Status: None (Preliminary result)   Collection Time: 05/01/21 11:17 AM   Specimen: BLOOD LEFT FOREARM  Result Value Ref Range Status   Specimen Description   Final    BLOOD LEFT FOREARM BOTTLES DRAWN AEROBIC AND ANAEROBIC   Special Requests Blood Culture adequate volume  Final   Culture   Final    NO GROWTH 2 DAYS Performed at [facility], [address] [place], [place] [address]    Report Status PENDING  Incomplete  Resp Panel by RT-PCR (Flu A&B, Covid) Nasopharyngeal Swab     Status: None   Collection Time: 05/01/21  1:15 PM   Specimen: Nasopharyngeal Swab; Nasopharyngeal(NP) swabs in vial transport medium  Result Value Ref Range Status   SARS Coronavirus 2 by RT PCR NEGATIVE NEGATIVE Final    Comment: (NOTE) SARS-CoV-2 target nucleic acids are NOT DETECTED.  The SARS-CoV-2 RNA is generally detectable in upper respiratory specimens  during the acute phase of infection. The lowest concentration of SARS-CoV-2 viral copies this assay can detect is 138 copies/mL. A negative result does not preclude SARS-Cov-2 infection and should not be used as the sole basis for treatment or other patient management decisions. A negative result may occur with  improper specimen collection/handling, submission of specimen other than nasopharyngeal swab, presence of viral mutation(s) within the areas targeted by this assay, and inadequate number of viral copies(<138 copies/mL). A negative result must be combined with clinical observations, patient history, and epidemiological information. The expected result is Negative.  Fact Sheet for Patients:  EntrepreneurPulse.com.au  Fact Sheet for Healthcare Providers:  IncredibleEmployment.be  This test is no t yet approved or cleared by the [place] FDA and  has been authorized for detection and/or diagnosis of SARS-CoV-2 by FDA under an Emergency Use Authorization (EUA). This EUA will remain  in effect (meaning this  test can be used) for the duration of the COVID-19 declaration under Section 564(b)(1) of the Act, 21 U.S.C.section 360bbb-3(b)(1), unless the authorization is terminated  or revoked sooner.       Influenza A by PCR NEGATIVE NEGATIVE Final   Influenza B by PCR NEGATIVE NEGATIVE Final    Comment: (NOTE) The Xpert Xpress SARS-CoV-2/FLU/RSV plus assay is intended as an aid in the diagnosis of influenza from Nasopharyngeal swab specimens and should not be used as a sole basis for treatment. Nasal washings and aspirates are unacceptable for Xpert Xpress SARS-CoV-2/FLU/RSV testing.  Fact Sheet for Patients: EntrepreneurPulse.com.au  Fact Sheet for Healthcare Providers: IncredibleEmployment.be  This test is not yet approved or cleared by the [place] FDA and has been authorized for detection  and/or diagnosis of SARS-CoV-2 by FDA under an Emergency Use Authorization (EUA). This EUA will remain in effect (meaning this test can be used) for the duration of the COVID-19 declaration under Section 564(b)(1) of the Act, 21 U.S.C. section 360bbb-3(b)(1), unless the authorization is terminated or revoked.  Performed at [facility], [address], [place], [place] [address]   Urine Culture     Status: None   Collection Time: 05/01/21  1:15 PM   Specimen: Urine, Clean Catch  Result Value Ref Range Status   Specimen Description   Final    URINE, CLEAN CATCH Performed at [facility], [address] [place], [place] [address]    Special Requests   Final    NONE Performed at [facility], [address], [place], [place] [address]    Culture   Final    NO GROWTH Performed at [facility], [place] [address] [place], [place] [address]    Report Status 05/03/2021 FINAL  Final  MRSA Next Gen by PCR, Nasal     Status: None   Collection Time: 05/02/21  8:03 AM   Specimen: Nasal Mucosa; Nasal Swab  Result Value Ref Range Status   MRSA by PCR Next Gen NOT DETECTED NOT DETECTED Final    Comment: (NOTE) The GeneXpert MRSA Assay (FDA approved for NASAL specimens only), is one component of a comprehensive MRSA colonization surveillance program. It is not intended to diagnose MRSA infection nor to guide or monitor treatment for MRSA infections. Test performance is not FDA approved in patients less than 34 years old. Performed at [facility], [address] [place], [place] [address]     Labs: CBC: Recent Labs  Lab 05/01/21 1113 05/02/21 1884 05/03/21 0453 05/04/21 0407  WBC 25.7* 16.3* 12.2* 10.5  NEUTROABS 23.0*  --   --   --   HGB 9.9* 8.1* 8.0* 7.4*  HCT 30.2* 25.0* 26.4* 23.7*  MCV 100.0 102.5* 103.1* 99.2  PLT 299 249 275 166   Basic Metabolic Panel: Recent Labs  Lab 05/01/21 1113 05/02/21 0430 05/03/21 0453 05/04/21 0407  NA 138 139 138 137  K 4.2 4.3 4.3  4.5  CL 103 110 107 108  CO2 23 21* 24 24  GLUCOSE 116* 102* 101* 122*  BUN 36* 31* 31* 27*  CREATININE 0.93 0.69 0.73 0.67  CALCIUM 9.2 8.7* 8.8* 8.6*   Liver Function Tests: Recent Labs  Lab 05/01/21 1113  AST 40  ALT 28  ALKPHOS 82  BILITOT 1.2  PROT 5.6*  ALBUMIN 2.9*   CBG: Recent Labs  Lab 05/02/21 0802 05/03/21 0752 05/04/21 0822  GLUCAP 110* 108* 87    Discharge time spent: greater than 30 minutes.  Signed:  Roxan Hockey, MD [facility] 05/04/2021

## 2021-05-05 ENCOUNTER — Encounter: Payer: Self-pay | Admitting: Adult Health

## 2021-05-05 ENCOUNTER — Non-Acute Institutional Stay (SKILLED_NURSING_FACILITY): Payer: Medicare Other | Admitting: Adult Health

## 2021-05-05 ENCOUNTER — Other Ambulatory Visit: Payer: Self-pay | Admitting: Adult Health

## 2021-05-05 DIAGNOSIS — D500 Iron deficiency anemia secondary to blood loss (chronic): Secondary | ICD-10-CM

## 2021-05-05 DIAGNOSIS — E440 Moderate protein-calorie malnutrition: Secondary | ICD-10-CM

## 2021-05-05 DIAGNOSIS — I5032 Chronic diastolic (congestive) heart failure: Secondary | ICD-10-CM | POA: Diagnosis not present

## 2021-05-05 DIAGNOSIS — J8410 Pulmonary fibrosis, unspecified: Secondary | ICD-10-CM

## 2021-05-05 DIAGNOSIS — M0520 Rheumatoid vasculitis with rheumatoid arthritis of unspecified site: Secondary | ICD-10-CM

## 2021-05-05 DIAGNOSIS — F0150 Vascular dementia without behavioral disturbance: Secondary | ICD-10-CM

## 2021-05-05 DIAGNOSIS — J9611 Chronic respiratory failure with hypoxia: Secondary | ICD-10-CM

## 2021-05-05 DIAGNOSIS — S72002G Fracture of unspecified part of neck of left femur, subsequent encounter for closed fracture with delayed healing: Secondary | ICD-10-CM | POA: Diagnosis not present

## 2021-05-05 DIAGNOSIS — I480 Paroxysmal atrial fibrillation: Secondary | ICD-10-CM | POA: Diagnosis not present

## 2021-05-05 DIAGNOSIS — R6520 Severe sepsis without septic shock: Secondary | ICD-10-CM

## 2021-05-05 DIAGNOSIS — A419 Sepsis, unspecified organism: Secondary | ICD-10-CM

## 2021-05-05 DIAGNOSIS — J9601 Acute respiratory failure with hypoxia: Secondary | ICD-10-CM

## 2021-05-05 LAB — BPAM RBC
Blood Product Expiration Date: 202302192359
ISSUE DATE / TIME: 202302081301
Unit Type and Rh: 9500

## 2021-05-05 LAB — TYPE AND SCREEN
ABO/RH(D): O NEG
Antibody Screen: NEGATIVE
Unit division: 0

## 2021-05-05 MED ORDER — TRAMADOL HCL 50 MG PO TABS: 50.0000 mg | ORAL_TABLET | Freq: Four times a day (QID) | ORAL | 0 refills | Status: DC | PRN

## 2021-05-05 NOTE — Progress Notes (Signed)
Location:  [place] Room Number: 129-P Place of Service:  SNF (31) Provider: Ok Edwards, NP  CODE STATUS: DNR  Allergies  Allergen Reactions   Morphine Other (See Comments)    "makes me crazy"   Morphine And Related Nausea And Vomiting and Other (See Comments)    Hallucinations    Nirmatrelvir-Ritonavir Other (See Comments)    Other reaction(s): confusion     Chief Complaint  Patient presents with   Hospitalization Follow-up    Hospitalization follow up 05/01/2021-05/04/2021.    HPI:  He is a 88 year old man who has been hospitalized from 05-01-21 through 05-04-21. His medical history includes: pulmonary fibrosis; COPD; AAA; dementia. He presented to the ED from here with concerned for fevers. He has had been hospitalized for a left hip fracture. He had been on cefdinir and doxycycline for pneumonia. He was treated for sepsis he was treated with vancomycin cefepime and flagyl. His cultures were negative for growth. He will need 5 more days of augmentin. He is here for short term rehab with his goal to return back home. There are no reports of uncontrolled pain. No reports of insomnia. His weight is without significant change. He will continue to be followed for his chronic illnesses including:  Pulmonary fibrosis associated with RA/ chronic respiratory failure with hypoxia:  Paroxsymal atrial fibrillation:  Essential hypertension: Coronary artery disease of native coronary artery of native heart without angina:    Past Medical History:  Diagnosis Date   AAA (abdominal aortic aneurysm)    Anemia    Arthritis    ra   Atrial fibrillation (HCC)    COPD (chronic obstructive pulmonary disease) ([place]) Jan.2016   Small amount of Emphysema- Pulmonary Fibrosis ( Feb. 9, 2016 )   Coronary atherosclerosis    Dysrhythmia    A-Fib   Hematuria, gross    HISTORY OF  ?  2004   Hypertension    dr Daneen Schick   Immune disorder [facility])    Myocardial infarction ([place]) 2000    Pulmonary fibrosis ([place])    Rheumatoid arthritis ([place])    Shortness of breath    with exertion   Staph skin infection    Back    Past Surgical History:  Procedure Laterality Date   ABDOMINAL AORTIC ENDOVASCULAR STENT GRAFT N/A 01/06/2016   Procedure: ABDOMINAL AORTIC ENDOVASCULAR STENT GRAFT;  Surgeon: Waynetta Sandy, MD;  Location: [facility] OR;  Service: Vascular;  Laterality: N/A;   arm surgery     result of staph infection   BACK SURGERY     CORONARY ANGIOPLASTY     ESOPHAGOGASTRODUODENOSCOPY  2004    ?   esophagus wrapped     EYE SURGERY Bilateral 03-14-13   [place]   HAND SURGERY     rel to [place] Right 06/30/2020   Procedure: CLOSED REDUCTION HIP;  Surgeon: Carole Civil, MD;  Location: [facility];  Service: Orthopedics;  Laterality: Right;   JOINT REPLACEMENT     bil hips   JOINT REPLACEMENT Right 01-20-13   Knee   stents  2000   TOTAL KNEE ARTHROPLASTY Right 01/20/2013   Procedure: RIGHT TOTAL KNEE ARTHROPLASTY;  Surgeon: Kerin Salen, MD;  Location: [place];  Service: Orthopedics;  Laterality: Right;    Social History   Socioeconomic History   Marital status: Married    Spouse name: Not on file   Number of children: Not on file   Years  of education: Not on file   Highest education level: Not on file  Occupational History   Not on file  Tobacco Use   Smoking status: Former    Packs/day: 3.00    Years: 50.00    Pack years: 150.00    Types: Cigarettes    Quit date: 03/27/1978    Years since quitting: 43.1   Smokeless tobacco: Former    Types: Chew  Substance and Sexual Activity   Alcohol use: No    Alcohol/week: 0.0 standard drinks   Drug use: No   Sexual activity: Not on file  Other Topics Concern   Not on file  Social History Narrative   Not on file   Social Determinants of Health   Financial Resource Strain: Not on file  Food Insecurity: Not on file  Transportation Needs: Not on file  Physical Activity: Not on  file  Stress: Not on file  Social Connections: Not on file  Intimate Partner Violence: Not on file   Family History  Problem Relation Age of Onset   Heart disease Brother        Heart Disease before age 47   Stroke Brother    Heart attack Brother    Hypertension Brother    Diabetes Son    Heart disease Son        Heart Disease before age 36   Hypertension Son    Heart attack Son    Bleeding Disorder Father    COPD Father        Emphysema   Varicose Veins Sister    COPD Sister       VITAL SIGNS BP (!) 98/55    Pulse (!) 105    Temp 98.7 F (37.1 C)    Resp 18    Ht 6' (1.829 m)    SpO2 95%    BMI 21.80 kg/m   Outpatient Encounter Medications as of 05/05/2021  Medication Sig   acetaminophen (TYLENOL) 325 MG tablet Take 2 tablets (650 mg total) by mouth every 4 (four) hours as needed.   amoxicillin-clavulanate (AUGMENTIN) 875-125 MG tablet Take 1 tablet by mouth 2 (two) times daily for 5 days.   aspirin 81 MG tablet Take 1 tablet (81 mg total) by mouth daily with breakfast.   bisacodyl (DULCOLAX) 10 MG suppository Place 1 suppository (10 mg total) rectally See admin instructions. Every Monday and Friday only   calcium carbonate (OS-CAL) 600 MG TABS Take 600 mg by mouth daily.   carboxymethylcellulose (REFRESH PLUS) 0.5 % SOLN 1 drop 3 (three) times daily as needed (dry eyes).   DULoxetine (CYMBALTA) 30 MG capsule Take 30 mg by mouth daily.   ferrous sulfate 325 (65 FE) MG tablet Take 325 mg by mouth daily with breakfast.   ipratropium (ATROVENT) 0.02 % nebulizer solution Take 2.5 mLs (0.5 mg total) by nebulization every 6 (six) hours as needed for wheezing or shortness of breath.   metoprolol tartrate (LOPRESSOR) 25 MG tablet Take 0.5 tablets (12.5 mg total) by mouth 2 (two) times daily.   Multiple Vitamin (MULTIVITAMIN) capsule Take 1 capsule by mouth daily.   predniSONE (DELTASONE) 10 MG tablet Take 1 tablet (10 mg total) by mouth daily with breakfast for 5 days. Then go back  to 5 mg daily indefinitely after 5 days  of 10 mg daily   senna-docusate (SENOKOT-S) 8.6-50 MG tablet Take 2 tablets by mouth at bedtime.   sulfaSALAzine (AZULFIDINE) 500 MG tablet Take 500 mg by mouth in  the morning, at noon, in the evening, and at bedtime.   tamsulosin (FLOMAX) 0.4 MG CAPS capsule Take 0.4 mg by mouth daily.   traMADol (ULTRAM) 50 MG tablet Take 1 tablet (50 mg total) by mouth every 6 (six) hours as needed for moderate pain.   traZODone (DESYREL) 50 MG tablet Take 0.5 tablets (25 mg total) by mouth at bedtime.   Vitamin D, Ergocalciferol, (DRISDOL) 1.25 MG (50000 UNIT) CAPS capsule Take 50,000 Units by mouth every 7 (seven) days. Sundays   [DISCONTINUED] predniSONE (DELTASONE) 5 MG tablet Take 5 mg by mouth daily.   No facility-administered encounter medications on file as of 05/05/2021.     SIGNIFICANT DIAGNOSTIC EXAMS  PREVIOUS   04-25-21: left humerus x-ray: no left humerus fracture   04-25-21: left femur x-ray:  New periprosthetic curvilinear bone lucency in the lateral aspect of the left greater trochanter and proximal metaphysis suspicious for nondisplaced periprosthetic proximal left femoral fracture.  No hip dislocation Left total hip arthroplasty   04-26-21: chest x-ray: interstitial lung disease with some superimposed pachy density bilaterally that could reflect edema or pneumonia   TODAY   LABS REVIEWED: PREVIOUS    04-25-20: wbc 11.4; hgb 11.6; hct 36.0 plt 228; glucose 100; bun 17; creat 0.75 ;k+ 4.2; na++ 143; ca 9.5 albumin 3.0  04-28-21: hgb 9,3; hct 28.6;  TODAY  05-01-21: wbc 25.7; hgb 9.9; hct 30.2; mcv 100.0 plt 299; glucose 116; bun 36; creat 0.93; k+ 4.2; na++ 138; ca 9.2 GFR>60; liver normal albumin 2.9; lactic acid 1.9; CRP 31.5; sed rate 70; blood and urine culture: no growth 05-02-21: 16.3; hgb 8.1; hct 25.0; mcv 102.5 plt 249; glucose 102; bun 31; creat 0.69; k+ 4.3; na++ 139; ca 8.7; GFR>60 05-04-21: wbc 10.5; hgb 7.4; hct 23.7; mcv 99.2 plt 324;  glucose 122; bun 27; creat 0.67; k+ 4.0; na++ 137; ca 8.6 GFR>60    Review of Systems  Unable to perform ROS: Dementia   Physical Exam Constitutional:      General: He is not in acute distress.    Appearance: He is well-developed. He is not diaphoretic.  Neck:     Thyroid: No thyromegaly.  Cardiovascular:     Rate and Rhythm: Normal rate and regular rhythm.     Pulses: Normal pulses.     Heart sounds: Murmur heard.  Pulmonary:     Effort: Pulmonary effort is normal. No respiratory distress.     Breath sounds: Normal breath sounds.     Comments: 02 Abdominal:     General: Bowel sounds are normal. There is no distension.     Palpations: Abdomen is soft.     Tenderness: There is no abdominal tenderness.  Musculoskeletal:     Cervical back: Neck supple.     Right lower leg: No edema.     Left lower leg: No edema.     Comments:  Brace wraps around to left hip; and abduction pillow   Lymphadenopathy:     Cervical: No cervical adenopathy.  Skin:    General: Skin is warm and dry.  Neurological:     Mental Status: He is alert. Mental status is at baseline.  Psychiatric:        Mood and Affect: Mood normal.    ASSESSMENT/ PLAN:  TODAY      Closed left hip fracture with delayed healing subsequent encounter: is stable will continue ultram 50 mg every 6 hours as needed   2. Sepsis with acute respiratory failure due  to unspecified organism unspecified whether septic shock present. Will complete abt and will continue to monitor her status.   3. Pulmonary fibrosis associated with RA/ chronic respiratory failure with hypoxia: is stable is on 02  4. Paroxsymal atrial fibrillation: heart rate is stable will continue asa 81 mg daily lopressor 12.5 mg twice daily for rate control.   5. Essential hypertension: is stable will continue lopressor 12.5 mg twice daily   6. Coronary artery disease of native coronary artery of native heart without angina: is stable will continue asa 81 mg  daily lopressor 12.5 mg twice daily   7. Mixed hyperlipidemia: is stable is off lipitor   8. Rheumatoid arteritis: is stable will continue azulfidine 500 mg  four times  daily prednisone 5 mg daily cymbalta 30 mg daily for pain management   9. Chronic diastolic congestive heart failure: is stable is euvolemic will monitor  10. Vascular dementia without behavioral disturbance  11. GERD without esophagitis: is stable is off pepcid   12. Anemia of chronic disease: hgb 7.4  will continue iron daily   13.vitamin D deficiency: will continue vitamin d 50,000 units weekly   14. Benign prostatic hyperplasia without urinary symptoms: will continue flomax 0.4 mg daily   15. Primary insomnia: will continue trazodone 25 mg nightly           Ok Edwards NP [facility] Adult Medicine   call [phone]

## 2021-05-06 ENCOUNTER — Non-Acute Institutional Stay (SKILLED_NURSING_FACILITY): Payer: Medicare Other | Admitting: Adult Health

## 2021-05-06 ENCOUNTER — Encounter: Payer: Self-pay | Admitting: Adult Health

## 2021-05-06 DIAGNOSIS — I480 Paroxysmal atrial fibrillation: Secondary | ICD-10-CM | POA: Diagnosis not present

## 2021-05-06 DIAGNOSIS — I5032 Chronic diastolic (congestive) heart failure: Secondary | ICD-10-CM

## 2021-05-06 DIAGNOSIS — J9611 Chronic respiratory failure with hypoxia: Secondary | ICD-10-CM | POA: Diagnosis not present

## 2021-05-06 DIAGNOSIS — F0150 Vascular dementia without behavioral disturbance: Secondary | ICD-10-CM | POA: Diagnosis not present

## 2021-05-06 LAB — CULTURE, BLOOD (ROUTINE X 2)
Culture: NO GROWTH
Culture: NO GROWTH
Special Requests: ADEQUATE
Special Requests: ADEQUATE

## 2021-05-06 NOTE — Progress Notes (Signed)
Location:  [place] Room Number: 129-P Place of Service:  SNF (31)   CODE STATUS: DNR  Allergies  Allergen Reactions   Morphine Other (See Comments)    "makes me crazy"   Morphine And Related Nausea And Vomiting and Other (See Comments)    Hallucinations    Nirmatrelvir-Ritonavir Other (See Comments)    Other reaction(s): confusion     Chief Complaint  Patient presents with   Acute Visit    Care plan meeting    HPI:  We have come together for his care plan meeting. Family present. Confused. He requires extensive assist with his adls. He is nonambulatory. He is frequently incontinent of bladder and occasionally incontinent of bowel. Dietary: weight is 157.4 pounds regular diet with a poor appetite. Is able to feed self. Therapy: requires max assist with adls, working on transfers; wheelchair and adls. He has had 2 falls since his initial admission here with no injuries. Family is concerned that he is having pain. He will continue to be followed for his chronic illnesses including:   Paroxsymal atrial fibrillation  Chronic diastolic (congestive) heart failure  Chronic respiratory failure with hypoxia  Vascular dementia without behavioral disturbance  Past Medical History:  Diagnosis Date   AAA (abdominal aortic aneurysm)    Anemia    Arthritis    ra   Atrial fibrillation (HCC)    COPD (chronic obstructive pulmonary disease) ([place]) Jan.2016   Small amount of Emphysema- Pulmonary Fibrosis ( Feb. 9, 2016 )   Coronary atherosclerosis    Dysrhythmia    A-Fib   Hematuria, gross    HISTORY OF  ?  2004   Hypertension    dr Daneen Schick   Immune disorder [facility])    Myocardial infarction ([place]) 2000   Pulmonary fibrosis ([place])    Rheumatoid arthritis ([place])    Shortness of breath    with exertion   Staph skin infection    Back    Past Surgical History:  Procedure Laterality Date   ABDOMINAL AORTIC ENDOVASCULAR STENT GRAFT N/A 01/06/2016   Procedure:  ABDOMINAL AORTIC ENDOVASCULAR STENT GRAFT;  Surgeon: Waynetta Sandy, MD;  Location: [facility] OR;  Service: Vascular;  Laterality: N/A;   arm surgery     result of staph infection   BACK SURGERY     CORONARY ANGIOPLASTY     ESOPHAGOGASTRODUODENOSCOPY  2004    ?   esophagus wrapped     EYE SURGERY Bilateral 03-14-13   [place]   HAND SURGERY     rel to [place] Right 06/30/2020   Procedure: CLOSED REDUCTION HIP;  Surgeon: Carole Civil, MD;  Location: [facility];  Service: Orthopedics;  Laterality: Right;   JOINT REPLACEMENT     bil hips   JOINT REPLACEMENT Right 01-20-13   Knee   stents  2000   TOTAL KNEE ARTHROPLASTY Right 01/20/2013   Procedure: RIGHT TOTAL KNEE ARTHROPLASTY;  Surgeon: Kerin Salen, MD;  Location: [place];  Service: Orthopedics;  Laterality: Right;    Social History   Socioeconomic History   Marital status: Married    Spouse name: Not on file   Number of children: Not on file   Years of education: Not on file   Highest education level: Not on file  Occupational History   Not on file  Tobacco Use   Smoking status: Former    Packs/day: 3.00    Years: 50.00    Pack  years: 150.00    Types: Cigarettes    Quit date: 03/27/1978    Years since quitting: 43.1   Smokeless tobacco: Former    Types: Chew  Substance and Sexual Activity   Alcohol use: No    Alcohol/week: 0.0 standard drinks   Drug use: No   Sexual activity: Not on file  Other Topics Concern   Not on file  Social History Narrative   Not on file   Social Determinants of Health   Financial Resource Strain: Not on file  Food Insecurity: Not on file  Transportation Needs: Not on file  Physical Activity: Not on file  Stress: Not on file  Social Connections: Not on file  Intimate Partner Violence: Not on file   Family History  Problem Relation Age of Onset   Heart disease Brother        Heart Disease before age 51   Stroke Brother    Heart attack Brother     Hypertension Brother    Diabetes Son    Heart disease Son        Heart Disease before age 75   Hypertension Son    Heart attack Son    Bleeding Disorder Father    COPD Father        Emphysema   Varicose Veins Sister    COPD Sister       VITAL SIGNS BP 100/67    Pulse 69    Temp 97.6 F (36.4 C)    Resp 20    Ht 6' (1.829 m)    SpO2 96%    BMI 21.80 kg/m   Outpatient Encounter Medications as of 05/06/2021  Medication Sig   acetaminophen (TYLENOL) 325 MG tablet Take 2 tablets (650 mg total) by mouth every 4 (four) hours as needed.   amoxicillin-clavulanate (AUGMENTIN) 875-125 MG tablet Take 1 tablet by mouth 2 (two) times daily for 5 days.   aspirin 81 MG tablet Take 1 tablet (81 mg total) by mouth daily with breakfast.   bisacodyl (DULCOLAX) 10 MG suppository Place 1 suppository (10 mg total) rectally See admin instructions. Every Monday and Friday only   calcium carbonate (OS-CAL) 600 MG TABS Take 600 mg by mouth daily.   carboxymethylcellulose (REFRESH PLUS) 0.5 % SOLN 1 drop 3 (three) times daily as needed (dry eyes).   DULoxetine (CYMBALTA) 30 MG capsule Take 30 mg by mouth daily.   ferrous sulfate 325 (65 FE) MG tablet Take 325 mg by mouth daily with breakfast.   ipratropium (ATROVENT) 0.02 % nebulizer solution Take 2.5 mLs (0.5 mg total) by nebulization every 6 (six) hours as needed for wheezing or shortness of breath.   metoprolol tartrate (LOPRESSOR) 25 MG tablet Take 0.5 tablets (12.5 mg total) by mouth 2 (two) times daily.   Multiple Vitamin (MULTIVITAMIN) capsule Take 1 capsule by mouth daily.   Nutritional Supplements (ENSURE ENLIVE PO) Take by mouth. between meals secondary to poor meal intake and risk for malnutrition Twice A Day   OXYGEN Inhale 2 L into the lungs. Document O2 sat qshift. Every Shift   predniSONE (DELTASONE) 10 MG tablet Take 1 tablet (10 mg total) by mouth daily with breakfast for 5 days. Then go back to 5 mg daily indefinitely after 5 days  of 10 mg  daily   senna-docusate (SENOKOT-S) 8.6-50 MG tablet Take 2 tablets by mouth at bedtime.   sulfaSALAzine (AZULFIDINE) 500 MG tablet Take 500 mg by mouth in the morning, at noon, in the  evening, and at bedtime.   tamsulosin (FLOMAX) 0.4 MG CAPS capsule Take 0.4 mg by mouth daily.   traMADol (ULTRAM) 50 MG tablet Take 1 tablet (50 mg total) by mouth every 6 (six) hours as needed for moderate pain.   traZODone (DESYREL) 50 MG tablet Take 0.5 tablets (25 mg total) by mouth at bedtime.   Vitamin D, Ergocalciferol, (DRISDOL) 1.25 MG (50000 UNIT) CAPS capsule Take 50,000 Units by mouth every 7 (seven) days. Sundays   No facility-administered encounter medications on file as of 05/06/2021.     SIGNIFICANT DIAGNOSTIC EXAMS  PREVIOUS   04-25-21: left humerus x-ray: no left humerus fracture   04-25-21: left femur x-ray:  New periprosthetic curvilinear bone lucency in the lateral aspect of the left greater trochanter and proximal metaphysis suspicious for nondisplaced periprosthetic proximal left femoral fracture.  No hip dislocation Left total hip arthroplasty   04-26-21: chest x-ray: interstitial lung disease with some superimposed pachy density bilaterally that could reflect edema or pneumonia   05-01-21: ct angio of chest:  1. No evidence of acute pulmonary embolism. 2. Abnormal esophagus by CT with diffuse thickening and fluid and debris within the esophagus. There is evidence of prior fundoplication/hiatal hernia repair. Underlying esophageal pathology is not excluded including neoplasm or stricture. 3. Progressive severe pulmonary fibrosis in both lungs as well as areas of airspace disease particularly in the lower lobes, right greater than left. Underlying pneumonia would be difficult to exclude. Correlation suggested with the possibility of chronic aspiration given esophageal abnormalities. 4. Mildly prominent mediastinal and right hilar lymph nodes may be reactive lymphadenopathy secondary to  progressive lung disease. 5. Mildly dilated central pulmonary arteries are suggestive of some degree of underlying pulmonary hypertension. 6. Aortic and coronary atherosclerosis. Aortic Atherosclerosis  and Emphysema   05-01-21: chest x-ray:  Similar appearance of the diffuse peripherally predominant interstitial and patchy airspace opacity suggesting underlying chronic lung disease.  05-02-21: left hip x-ray:  Previous bilateral hip arthroplasty. There is recent essentially undisplaced fracture in the lateral aspect of proximal left femur. No fracture or dislocation is seen in the right hip  NO NEW EXAMS.   LABS REVIEWED: PREVIOUS    04-25-20: wbc 11.4; hgb 11.6; hct 36.0 plt 228; glucose 100; bun 17; creat 0.75 ;k+ 4.2; na++ 143; ca 9.5 albumin 3.0  04-28-21: hgb 9,3; hct 28.6; 05-01-21: wbc 25.7; hgb 9.9; hct 30.2; mcv 100.0 plt 299; glucose 116; bun 36; creat 0.93; k+ 4.2; na++ 138; ca 9.2 GFR>60; liver normal albumin 2.9; lactic acid 1.9; CRP 31.5; sed rate 70; blood and urine culture: no growth 05-02-21: 16.3; hgb 8.1; hct 25.0; mcv 102.5 plt 249; glucose 102; bun 31; creat 0.69; k+ 4.3; na++ 139; ca 8.7; GFR>60 05-04-21: wbc 10.5; hgb 7.4; hct 23.7; mcv 99.2 plt 324; glucose 122; bun 27; creat 0.67; k+ 4.0; na++ 137; ca 8.6 GFR>60   NO LEW LABS.   Review of Systems  Unable to perform ROS: Dementia    Physical Exam Constitutional:      General: He is not in acute distress.    Appearance: He is well-developed. He is not diaphoretic.  Neck:     Thyroid: No thyromegaly.  Cardiovascular:     Rate and Rhythm: Normal rate and regular rhythm.     Heart sounds: Murmur heard.  Pulmonary:     Effort: Pulmonary effort is normal. No respiratory distress.     Breath sounds: Normal breath sounds.     Comments: 02 Abdominal:  General: Bowel sounds are normal. There is no distension.     Palpations: Abdomen is soft.     Tenderness: There is no abdominal tenderness.  Musculoskeletal:      Cervical back: Neck supple.     Right lower leg: No edema.     Left lower leg: No edema.     Comments: Brace wraps around to left hip; and abduction pillow    Lymphadenopathy:     Cervical: No cervical adenopathy.  Skin:    General: Skin is warm and dry.  Neurological:     Mental Status: He is alert. Mental status is at baseline.  Psychiatric:        Mood and Affect: Mood normal.      ASSESSMENT/ PLAN:  TODAY  Paroxsymal atrial fibrillation Chronic diastolic (congestive) heart failure Chronic respiratory failure with hypoxia Vascular dementia without behavioral disturbance  Will begin tylenol cr 650 mg every 6 hours Will continue therapy as directed Will continue to monitor his status.   Time spent with patient: 40 minutes: therapy goals of care; medications.    Ok Edwards NP [facility] Adult Medicine   call [phone]

## 2021-05-10 ENCOUNTER — Encounter: Payer: Self-pay | Admitting: Internal Medicine

## 2021-05-10 DIAGNOSIS — J189 Pneumonia, unspecified organism: Secondary | ICD-10-CM

## 2021-05-10 NOTE — Progress Notes (Signed)
This encounter was created in error - please disregard.

## 2021-05-11 ENCOUNTER — Observation Stay (HOSPITAL_COMMUNITY): Payer: Medicare Other | Admitting: Certified Registered"

## 2021-05-11 ENCOUNTER — Encounter (HOSPITAL_COMMUNITY): Payer: Self-pay | Admitting: Emergency Medicine

## 2021-05-11 ENCOUNTER — Encounter (HOSPITAL_COMMUNITY): Admission: EM | Disposition: E | Payer: Self-pay | Source: Skilled Nursing Facility | Attending: Internal Medicine

## 2021-05-11 ENCOUNTER — Emergency Department (HOSPITAL_COMMUNITY): Payer: Medicare Other

## 2021-05-11 ENCOUNTER — Other Ambulatory Visit: Payer: Self-pay

## 2021-05-11 ENCOUNTER — Inpatient Hospital Stay (HOSPITAL_COMMUNITY)
Admission: EM | Admit: 2021-05-11 | Discharge: 2021-05-25 | DRG: 559 | Disposition: E | Payer: Medicare Other | Source: Skilled Nursing Facility | Attending: Internal Medicine | Admitting: Internal Medicine

## 2021-05-11 ENCOUNTER — Observation Stay (HOSPITAL_COMMUNITY): Payer: Medicare Other

## 2021-05-11 DIAGNOSIS — I2510 Atherosclerotic heart disease of native coronary artery without angina pectoris: Secondary | ICD-10-CM | POA: Diagnosis not present

## 2021-05-11 DIAGNOSIS — Y792 Prosthetic and other implants, materials and accessory orthopedic devices associated with adverse incidents: Secondary | ICD-10-CM | POA: Diagnosis present

## 2021-05-11 DIAGNOSIS — T84020A Dislocation of internal right hip prosthesis, initial encounter: Principal | ICD-10-CM | POA: Diagnosis present

## 2021-05-11 DIAGNOSIS — I714 Abdominal aortic aneurysm, without rupture, unspecified: Secondary | ICD-10-CM | POA: Diagnosis present

## 2021-05-11 DIAGNOSIS — F0150 Vascular dementia without behavioral disturbance: Secondary | ICD-10-CM | POA: Diagnosis present

## 2021-05-11 DIAGNOSIS — J439 Emphysema, unspecified: Secondary | ICD-10-CM | POA: Diagnosis present

## 2021-05-11 DIAGNOSIS — J8410 Pulmonary fibrosis, unspecified: Secondary | ICD-10-CM | POA: Diagnosis present

## 2021-05-11 DIAGNOSIS — F05 Delirium due to known physiological condition: Secondary | ICD-10-CM | POA: Diagnosis not present

## 2021-05-11 DIAGNOSIS — J449 Chronic obstructive pulmonary disease, unspecified: Secondary | ICD-10-CM | POA: Diagnosis present

## 2021-05-11 DIAGNOSIS — Z96651 Presence of right artificial knee joint: Secondary | ICD-10-CM | POA: Diagnosis present

## 2021-05-11 DIAGNOSIS — K219 Gastro-esophageal reflux disease without esophagitis: Secondary | ICD-10-CM | POA: Diagnosis present

## 2021-05-11 DIAGNOSIS — S73006A Unspecified dislocation of unspecified hip, initial encounter: Secondary | ICD-10-CM | POA: Diagnosis present

## 2021-05-11 DIAGNOSIS — I5032 Chronic diastolic (congestive) heart failure: Secondary | ICD-10-CM | POA: Diagnosis present

## 2021-05-11 DIAGNOSIS — Z20822 Contact with and (suspected) exposure to covid-19: Secondary | ICD-10-CM | POA: Diagnosis present

## 2021-05-11 DIAGNOSIS — Z96643 Presence of artificial hip joint, bilateral: Secondary | ICD-10-CM | POA: Diagnosis present

## 2021-05-11 DIAGNOSIS — Z888 Allergy status to other drugs, medicaments and biological substances status: Secondary | ICD-10-CM

## 2021-05-11 DIAGNOSIS — Z515 Encounter for palliative care: Secondary | ICD-10-CM

## 2021-05-11 DIAGNOSIS — F418 Other specified anxiety disorders: Secondary | ICD-10-CM | POA: Diagnosis present

## 2021-05-11 DIAGNOSIS — T84029A Dislocation of unspecified internal joint prosthesis, initial encounter: Secondary | ICD-10-CM | POA: Diagnosis not present

## 2021-05-11 DIAGNOSIS — Z885 Allergy status to narcotic agent status: Secondary | ICD-10-CM

## 2021-05-11 DIAGNOSIS — Z66 Do not resuscitate: Secondary | ICD-10-CM | POA: Diagnosis present

## 2021-05-11 DIAGNOSIS — G9341 Metabolic encephalopathy: Secondary | ICD-10-CM | POA: Diagnosis not present

## 2021-05-11 DIAGNOSIS — S73004A Unspecified dislocation of right hip, initial encounter: Secondary | ICD-10-CM

## 2021-05-11 DIAGNOSIS — I480 Paroxysmal atrial fibrillation: Secondary | ICD-10-CM | POA: Diagnosis present

## 2021-05-11 DIAGNOSIS — Z87891 Personal history of nicotine dependence: Secondary | ICD-10-CM

## 2021-05-11 DIAGNOSIS — S72112A Displaced fracture of greater trochanter of left femur, initial encounter for closed fracture: Secondary | ICD-10-CM | POA: Diagnosis present

## 2021-05-11 DIAGNOSIS — T148XXA Other injury of unspecified body region, initial encounter: Secondary | ICD-10-CM

## 2021-05-11 DIAGNOSIS — M25559 Pain in unspecified hip: Secondary | ICD-10-CM

## 2021-05-11 DIAGNOSIS — Z96649 Presence of unspecified artificial hip joint: Secondary | ICD-10-CM

## 2021-05-11 DIAGNOSIS — M9702XA Periprosthetic fracture around internal prosthetic left hip joint, initial encounter: Secondary | ICD-10-CM | POA: Diagnosis present

## 2021-05-11 DIAGNOSIS — F03918 Unspecified dementia, unspecified severity, with other behavioral disturbance: Secondary | ICD-10-CM | POA: Diagnosis present

## 2021-05-11 DIAGNOSIS — Z8679 Personal history of other diseases of the circulatory system: Secondary | ICD-10-CM

## 2021-05-11 DIAGNOSIS — N400 Enlarged prostate without lower urinary tract symptoms: Secondary | ICD-10-CM | POA: Diagnosis present

## 2021-05-11 DIAGNOSIS — Z7982 Long term (current) use of aspirin: Secondary | ICD-10-CM

## 2021-05-11 DIAGNOSIS — Z9981 Dependence on supplemental oxygen: Secondary | ICD-10-CM

## 2021-05-11 DIAGNOSIS — Z79891 Long term (current) use of opiate analgesic: Secondary | ICD-10-CM

## 2021-05-11 DIAGNOSIS — I110 Hypertensive heart disease with heart failure: Secondary | ICD-10-CM | POA: Diagnosis present

## 2021-05-11 DIAGNOSIS — I10 Essential (primary) hypertension: Secondary | ICD-10-CM | POA: Diagnosis present

## 2021-05-11 DIAGNOSIS — I252 Old myocardial infarction: Secondary | ICD-10-CM

## 2021-05-11 DIAGNOSIS — Z7952 Long term (current) use of systemic steroids: Secondary | ICD-10-CM

## 2021-05-11 DIAGNOSIS — J9611 Chronic respiratory failure with hypoxia: Secondary | ICD-10-CM | POA: Diagnosis present

## 2021-05-11 DIAGNOSIS — M069 Rheumatoid arthritis, unspecified: Secondary | ICD-10-CM | POA: Diagnosis present

## 2021-05-11 HISTORY — DX: Pneumonia, unspecified organism: J18.9

## 2021-05-11 LAB — BASIC METABOLIC PANEL
Anion gap: 10 (ref 5–15)
BUN: 17 mg/dL (ref 8–23)
CO2: 26 mmol/L (ref 22–32)
Calcium: 9.6 mg/dL (ref 8.9–10.3)
Chloride: 102 mmol/L (ref 98–111)
Creatinine, Ser: 0.69 mg/dL (ref 0.61–1.24)
GFR, Estimated: >60 mL/min (ref >60)
Glucose, Bld: 87 mg/dL (ref 70–99)
Potassium: 4.5 mmol/L (ref 3.5–5.1)
Sodium: 138 mmol/L (ref 135–145)

## 2021-05-11 LAB — CBC WITH DIFFERENTIAL/PLATELET
Abs Immature Granulocytes: 0.08 10*3/uL — ABNORMAL HIGH (ref 0.00–0.07)
Basophils Absolute: 0.1 10*3/uL (ref 0.0–0.1)
Basophils Relative: 1 %
Eosinophils Absolute: 0.2 10*3/uL (ref 0.0–0.5)
Eosinophils Relative: 2 %
HCT: 33.5 % — ABNORMAL LOW (ref 39.0–52.0)
Hemoglobin: 10.6 g/dL — ABNORMAL LOW (ref 13.0–17.0)
Immature Granulocytes: 1 %
Lymphocytes Relative: 10 %
Lymphs Abs: 1.3 10*3/uL (ref 0.7–4.0)
MCH: 32.5 pg (ref 26.0–34.0)
MCHC: 31.6 g/dL (ref 30.0–36.0)
MCV: 102.8 fL — ABNORMAL HIGH (ref 80.0–100.0)
Monocytes Absolute: 1 10*3/uL (ref 0.1–1.0)
Monocytes Relative: 8 %
Neutro Abs: 10 10*3/uL — ABNORMAL HIGH (ref 1.7–7.7)
Neutrophils Relative %: 78 %
Platelets: 662 10*3/uL — ABNORMAL HIGH (ref 150–400)
RBC: 3.26 MIL/uL — ABNORMAL LOW (ref 4.22–5.81)
RDW: 14.2 % (ref 11.5–15.5)
WBC: 12.6 10*3/uL — ABNORMAL HIGH (ref 4.0–10.5)
nRBC: 0 % (ref 0.0–0.2)

## 2021-05-11 LAB — RESP PANEL BY RT-PCR (FLU A&B, COVID) ARPGX2
Influenza A by PCR: NEGATIVE
Influenza B by PCR: NEGATIVE
SARS Coronavirus 2 by RT PCR: NEGATIVE

## 2021-05-11 SURGERY — CLOSED REDUCTION, HIP
Anesthesia: General | Site: Hip | Laterality: Right

## 2021-05-11 MED ORDER — SODIUM CHLORIDE 0.9% FLUSH: 3.0000 mL | INTRAVENOUS | Status: DC | PRN

## 2021-05-11 MED ORDER — ENSURE ENLIVE PO LIQD
237.0000 mL | Freq: Two times a day (BID) | ORAL | Status: DC
  Administered 2021-05-12 – 2021-05-13 (×2): 237 mL via ORAL
  Filled 2021-05-11 (×3): qty 237

## 2021-05-11 MED ORDER — CARBOXYMETHYLCELLULOSE SODIUM 0.5 % OP SOLN: 1.0000 [drp] | Freq: Three times a day (TID) | OPHTHALMIC | Status: DC | PRN

## 2021-05-11 MED ORDER — FENTANYL CITRATE PF 50 MCG/ML IJ SOSY
PREFILLED_SYRINGE | INTRAMUSCULAR | Status: AC
  Filled 2021-05-11: qty 1

## 2021-05-11 MED ORDER — TRAZODONE HCL 50 MG PO TABS
25.0000 mg | ORAL_TABLET | Freq: Every day | ORAL | Status: DC
  Administered 2021-05-11 – 2021-05-13 (×3): 25 mg via ORAL
  Filled 2021-05-11 (×3): qty 1

## 2021-05-11 MED ORDER — POLYVINYL ALCOHOL 1.4 % OP SOLN: 1.0000 [drp] | Freq: Three times a day (TID) | OPHTHALMIC | Status: DC | PRN

## 2021-05-11 MED ORDER — PROPOFOL 10 MG/ML IV BOLUS
INTRAVENOUS | Status: DC | PRN
  Administered 2021-05-11: 20 mg via INTRAVENOUS

## 2021-05-11 MED ORDER — HYDROMORPHONE HCL 1 MG/ML IJ SOLN
1.0000 mg | INTRAMUSCULAR | Status: DC | PRN
  Administered 2021-05-11 – 2021-05-14 (×10): 1 mg via INTRAVENOUS
  Filled 2021-05-11 (×10): qty 1

## 2021-05-11 MED ORDER — ACETAMINOPHEN 325 MG PO TABS: 650.0000 mg | ORAL_TABLET | Freq: Four times a day (QID) | ORAL | Status: DC | PRN

## 2021-05-11 MED ORDER — HALOPERIDOL LACTATE 5 MG/ML IJ SOLN
1.0000 mg | Freq: Four times a day (QID) | INTRAMUSCULAR | Status: DC | PRN
  Administered 2021-05-11 – 2021-05-14 (×8): 1 mg via INTRAVENOUS
  Filled 2021-05-11 (×9): qty 1

## 2021-05-11 MED ORDER — CALCIUM CARBONATE 600 MG PO TABS: 600.0000 mg | ORAL_TABLET | Freq: Every day | ORAL | Status: DC

## 2021-05-11 MED ORDER — SODIUM CHLORIDE 0.9 % IV SOLN: 250.0000 mL | INTRAVENOUS | Status: DC | PRN

## 2021-05-11 MED ORDER — FENTANYL CITRATE (PF) 100 MCG/2ML IJ SOLN
INTRAMUSCULAR | Status: AC | PRN
Start: 2021-05-11 — End: 2021-05-11
  Administered 2021-05-11: 50 ug via INTRAVENOUS

## 2021-05-11 MED ORDER — SODIUM CHLORIDE 0.9% FLUSH
3.0000 mL | Freq: Two times a day (BID) | INTRAVENOUS | Status: DC
  Administered 2021-05-11 – 2021-05-14 (×6): 3 mL via INTRAVENOUS

## 2021-05-11 MED ORDER — CALCIUM CARBONATE 1250 (500 CA) MG PO TABS
1.0000 | ORAL_TABLET | Freq: Every day | ORAL | Status: DC
  Administered 2021-05-12 – 2021-05-13 (×2): 500 mg via ORAL
  Filled 2021-05-11 (×3): qty 1

## 2021-05-11 MED ORDER — SULFASALAZINE 500 MG PO TABS
500.0000 mg | ORAL_TABLET | Freq: Every day | ORAL | Status: DC
  Administered 2021-05-12 – 2021-05-13 (×2): 500 mg via ORAL
  Filled 2021-05-11 (×7): qty 1

## 2021-05-11 MED ORDER — ASPIRIN EC 81 MG PO TBEC
81.0000 mg | DELAYED_RELEASE_TABLET | Freq: Every day | ORAL | Status: DC
  Administered 2021-05-12 – 2021-05-13 (×2): 81 mg via ORAL
  Filled 2021-05-11 (×3): qty 1

## 2021-05-11 MED ORDER — PREDNISONE 10 MG PO TABS
5.0000 mg | ORAL_TABLET | Freq: Every day | ORAL | Status: DC
Start: 2021-05-12 — End: 2021-05-14
  Administered 2021-05-12 – 2021-05-13 (×2): 5 mg via ORAL
  Filled 2021-05-11 (×3): qty 1

## 2021-05-11 MED ORDER — PROPOFOL 10 MG/ML IV BOLUS
0.5000 mg/kg | Freq: Once | INTRAVENOUS | Status: AC
  Administered 2021-05-11: 80 mg via INTRAVENOUS
  Filled 2021-05-11: qty 20

## 2021-05-11 MED ORDER — METOPROLOL TARTRATE 25 MG PO TABS
12.5000 mg | ORAL_TABLET | Freq: Two times a day (BID) | ORAL | Status: DC
Start: 2021-05-11 — End: 2021-05-14
  Administered 2021-05-11 – 2021-05-13 (×5): 12.5 mg via ORAL
  Filled 2021-05-11 (×6): qty 1

## 2021-05-11 MED ORDER — FENTANYL CITRATE PF 50 MCG/ML IJ SOSY
50.0000 ug | PREFILLED_SYRINGE | Freq: Once | INTRAMUSCULAR | Status: AC
  Administered 2021-05-11: 50 ug via INTRAVENOUS
  Filled 2021-05-11: qty 1

## 2021-05-11 MED ORDER — TAMSULOSIN HCL 0.4 MG PO CAPS
0.4000 mg | ORAL_CAPSULE | Freq: Every day | ORAL | Status: DC
Start: 2021-05-11 — End: 2021-05-14
  Administered 2021-05-12 – 2021-05-13 (×2): 0.4 mg via ORAL
  Filled 2021-05-11 (×3): qty 1

## 2021-05-11 MED ORDER — PROPOFOL 10 MG/ML IV BOLUS
INTRAVENOUS | Status: DC | PRN
  Administered 2021-05-11: 40 mg via INTRAVENOUS

## 2021-05-11 MED ORDER — SENNOSIDES-DOCUSATE SODIUM 8.6-50 MG PO TABS
2.0000 | ORAL_TABLET | Freq: Every day | ORAL | Status: DC
  Administered 2021-05-12 – 2021-05-13 (×2): 2 via ORAL
  Filled 2021-05-11 (×3): qty 2

## 2021-05-11 MED ORDER — SODIUM CHLORIDE 0.9 % IV BOLUS
500.0000 mL | Freq: Once | INTRAVENOUS | Status: AC
  Administered 2021-05-11: 500 mL via INTRAVENOUS

## 2021-05-11 MED ORDER — DULOXETINE HCL 30 MG PO CPEP
30.0000 mg | ORAL_CAPSULE | Freq: Every day | ORAL | Status: DC
Start: 2021-05-11 — End: 2021-05-14
  Administered 2021-05-12 – 2021-05-13 (×2): 30 mg via ORAL
  Filled 2021-05-11 (×3): qty 1

## 2021-05-11 MED ORDER — ADULT MULTIVITAMIN W/MINERALS CH
1.0000 | ORAL_TABLET | Freq: Every day | ORAL | Status: DC
  Administered 2021-05-12 – 2021-05-13 (×2): 1 via ORAL
  Filled 2021-05-11 (×3): qty 1

## 2021-05-11 MED ORDER — PANTOPRAZOLE SODIUM 40 MG PO TBEC
40.0000 mg | DELAYED_RELEASE_TABLET | Freq: Every day | ORAL | Status: DC
  Administered 2021-05-12 – 2021-05-13 (×2): 40 mg via ORAL
  Filled 2021-05-11 (×3): qty 1

## 2021-05-11 NOTE — ED Notes (Signed)
Pedal pulses present bilaterally, but weak. CMS intact to RLE except pt reports mild lack of sensation to feet bilaterally

## 2021-05-11 NOTE — Assessment & Plan Note (Addendum)
No chest pain, continue comfort care.

## 2021-05-11 NOTE — Assessment & Plan Note (Addendum)
Worsening delirium and encephalopathy.  Patient with persistent pain, no oral intake and non ambulatory. His wife is at the bedside.   Patient with poor prognosis, continue to be in pain.  Will transition to comfort care, concentrate in symptom control and prevent any further suffering.

## 2021-05-11 NOTE — ED Triage Notes (Signed)
Pt presents from [facility] with complaints of right hip pain. Wife reports hx of right hip popping out of place. Left hip was noted to be broken recently but no surgical intervention done. Pt is prone to falls but no associated fall reported by wife or facility. Wife states he was fine last night.

## 2021-05-11 NOTE — Assessment & Plan Note (Addendum)
Continue with supplemental 02 per [place] to keep 02 saturation of 88% or greater.  Oxygenation is 97% on 3,5 L/min per [place]

## 2021-05-11 NOTE — Assessment & Plan Note (Addendum)
Sp closed reduction of right hip.  Post procedure patient was placed on analgesics and a brace to hold hip.  Unfortunately patient continue to have pain, developed encephalopathy and delirium.  Patient with persistent and worsening hip pain, no oral intake and worsening encephalopathy.  Poor mobility and not able to ambulate, very weak and deconditioned, poor prognosis.  His wife decided to transition to comfort care to allow better pain control and not to prolong suffering.

## 2021-05-11 NOTE — ED Provider Notes (Signed)
[facility] EMERGENCY DEPARTMENT Provider Note   CSN: [id number] Arrival date & time: 05/13/2021  0160  LEVEL 5 CAVEAT - DEMENTIA   History  Chief Complaint  Patient presents with   Hip Pain    Right side    [name] is a 88 y.o. male.  HPI 88 year old male presents with right hip pain.  History is from wife at the bedside.  He was sent over from the [facility] due to grinding of his right hip.  He has had recurrent dislocations of this hip.  No known trauma or falls today or recently.  Patient has dementia and is unable to provide any significant history.  He currently has a left periprosthetic hip fracture as well that is nonoperative.  Wife reports he has a hip abduction brace but physical therapy has not been by recently and he only has it when he is standing.  Home Medications Prior to Admission medications   Medication Sig Start Date End Date Taking? Authorizing Provider  acetaminophen (TYLENOL) 325 MG tablet Take 2 tablets (650 mg total) by mouth every 4 (four) hours as needed. Patient taking differently: Take 650 mg by mouth every 6 (six) hours as needed for mild pain. 05/04/21 05/04/22 Yes Roxan Hockey, MD  aspirin 81 MG tablet Take 1 tablet (81 mg total) by mouth daily with breakfast. 05/04/21  Yes Emokpae, Courage, MD  bisacodyl (DULCOLAX) 10 MG suppository Place 1 suppository (10 mg total) rectally See admin instructions. Every Monday and Friday only 05/04/21  Yes Emokpae, Courage, MD  calcium carbonate (OS-CAL) 600 MG TABS Take 600 mg by mouth daily.   Yes [provider]  carboxymethylcellulose (REFRESH PLUS) 0.5 % SOLN 1 drop 3 (three) times daily as needed (dry eyes).   Yes [provider]  DULoxetine (CYMBALTA) 30 MG capsule Take 30 mg by mouth daily.   Yes [provider]  ferrous sulfate 325 (65 FE) MG tablet Take 325 mg by mouth daily with breakfast.   Yes [provider]  ipratropium (ATROVENT) 0.02 % nebulizer solution Take  2.5 mLs (0.5 mg total) by nebulization every 6 (six) hours as needed for wheezing or shortness of breath. 05/04/21  Yes Emokpae, Courage, MD  methocarbamol (ROBAXIN) 500 MG tablet Take 500 mg by mouth daily as needed for muscle spasms (Before Therapy).   Yes [provider]  metoprolol tartrate (LOPRESSOR) 25 MG tablet Take 0.5 tablets (12.5 mg total) by mouth 2 (two) times daily. 12/29/20  Yes Belva Crome, MD  Multiple Vitamin (MULTIVITAMIN) capsule Take 1 capsule by mouth daily. Therems with folic acid 109NAT   Yes [provider]  predniSONE (DELTASONE) 5 MG tablet Take 5 mg by mouth daily with breakfast.   Yes [provider]  senna-docusate (SENOKOT-S) 8.6-50 MG tablet Take 2 tablets by mouth at bedtime. 05/04/21 05/04/22 Yes Emokpae, Courage, MD  sulfaSALAzine (AZULFIDINE) 500 MG tablet Take 500 mg by mouth in the morning, at noon, in the evening, and at bedtime.   Yes [provider]  tamsulosin (FLOMAX) 0.4 MG CAPS capsule Take 0.4 mg by mouth daily.   Yes [provider]  traZODone (DESYREL) 50 MG tablet Take 0.5 tablets (25 mg total) by mouth at bedtime. Patient taking differently: Take 50 mg by mouth at bedtime. 05/04/21  Yes Emokpae, Courage, MD  Vitamin D, Ergocalciferol, (DRISDOL) 1.25 MG (50000 UNIT) CAPS capsule Take 50,000 Units by mouth every 7 (seven) days. Sundays   Yes [provider]  Nutritional Supplements (ENSURE ENLIVE PO) Take by mouth. between meals secondary to poor meal intake and risk for malnutrition Twice A Day    [provider]  OXYGEN Inhale 2 L into the lungs. Document O2 sat qshift. Every Shift    [provider]      Allergies    Morphine, Morphine and related, and Nirmatrelvir-ritonavir    Review of Systems   Review of Systems  Unable to perform ROS: Dementia   Physical Exam Updated Vital Signs BP 136/63 (BP Location: Left Arm)    Pulse 94    Temp 98.7 F (37.1 C) (Oral)    Resp 19     Ht 6' (1.829 m)    Wt 72 kg    SpO2 98%    BMI 21.52 kg/m  Physical Exam Vitals and nursing note reviewed.  Constitutional:      Appearance: He is well-developed.  HENT:     Head: Normocephalic and atraumatic.  Cardiovascular:     Rate and Rhythm: Normal rate and regular rhythm.     Pulses:          Dorsalis pedis pulses are 2+ on the right side.  Pulmonary:     Effort: Pulmonary effort is normal.  Abdominal:     General: There is no distension.  Musculoskeletal:     Right hip: Tenderness present. Decreased range of motion.     Right knee: No tenderness.       Legs:     Comments: Difficult exam as the patient is rolled onto the left side and resists exam on the right hip.  Skin:    General: Skin is warm and dry.  Neurological:     Mental Status: He is alert.    ED Results / Procedures / Treatments   Labs (all labs ordered are listed, but only abnormal results are displayed) Labs Reviewed  CBC WITH DIFFERENTIAL/PLATELET - Abnormal; Notable for the following components:      Result Value   WBC 12.6 (*)    RBC 3.26 (*)    Hemoglobin 10.6 (*)    HCT 33.5 (*)    MCV 102.8 (*)    Platelets 662 (*)    Neutro Abs 10.0 (*)    Abs Immature Granulocytes 0.08 (*)    All other components within normal limits  RESP PANEL BY RT-PCR (FLU A&B, COVID) ARPGX2  BASIC METABOLIC PANEL    EKG None  Radiology DG Pelvis Portable  Result Date: 05/02/2021 CLINICAL DATA:  Right hip dislocation.  Confirm reduction. EXAM: PORTABLE PELVIS 1-2 VIEWS COMPARISON:  Radiographs earlier the same date, 05/02/2021 and 04/25/2021. FINDINGS: 1219 hours. Persistent superior and posterior dislocation of the right total hip arthroplasty. Appearance is unchanged from the examination earlier today. No acute right hip fractures are identified, although the distal end of the prosthesis is not included. Known nondisplaced periprosthetic fracture of the proximal left femur appears grossly stable, incompletely  visualized. Abdominal aortoiliac stent graft again noted. IMPRESSION: Persistent dislocation of the right total hip arthroplasty. Electronically Signed   By: Richardean Sale M.D.   On: 05/13/2021 12:31   DG Hip Unilat W or Wo Pelvis 2-3 Views Right  Result Date: 05/05/2021 CLINICAL DATA:  Right hip pain. EXAM: DG HIP (WITH OR WITHOUT PELVIS) 2-3V RIGHT COMPARISON:  Left hip x-rays dated May 02, 2021. FINDINGS: Bilateral hip arthroplasties. New superior and posterior dislocation of the right femoral head component. Unchanged acute nondisplaced periprosthetic fractures of the left greater trochanter  and subtrochanteric femur. Old healed fracture of the right femoral diaphysis just inferior to the femoral stem. Abdominal aortoiliac stent graft again noted. Osteopenia. Soft tissues are unremarkable. IMPRESSION: 1. New right hip arthroplasty dislocation. 2. Unchanged acute nondisplaced periprosthetic fractures of the left greater trochanter and subtrochanteric femur. Electronically Signed   By: Titus Dubin M.D.   On: 05/22/2021 10:57    Procedures .Sedation  Date/Time: 05/07/2021 12:15 PM Performed by: Sherwood Gambler, MD Authorized by: Sherwood Gambler, MD   Consent:    Consent obtained:  Verbal and written   Consent given by:  Spouse Universal protocol:    Immediately prior to procedure, a time out was called: yes     Patient identity confirmed:  Verbally with patient Pre-sedation assessment:    Time since last food or drink:  4 hours   NPO status caution: urgency dictates proceeding with non-ideal NPO status     ASA classification: class 3 - patient with severe systemic disease     Mallampati score:  II - soft palate, uvula, fauces visible   Pre-sedation assessments completed and reviewed: airway patency, cardiovascular function, hydration status, mental status, nausea/vomiting, pain level, respiratory function and temperature   Immediate pre-procedure details:    Reviewed: vital signs,  relevant labs/tests and NPO status     Verified: bag valve mask available, emergency equipment available, IV patency confirmed and oxygen available   Procedure details (see MAR for exact dosages):    Preoxygenation:  Nasal cannula   Sedation:  Propofol   Intended level of sedation: deep   Intra-procedure monitoring:  Cardiac monitor, continuous pulse oximetry, blood pressure monitoring, continuous capnometry, frequent LOC assessments and frequent vital sign checks   Total Provider sedation time (minutes):  18 Post-procedure details:    Attendance: Constant attendance by certified staff until patient recovered     Recovery: Patient returned to pre-procedure baseline     Post-sedation assessments completed and reviewed: airway patency, cardiovascular function, hydration status, mental status, nausea/vomiting, pain level, respiratory function and temperature     Patient is stable for discharge or admission: yes     Procedure completion:  Tolerated well, no immediate complications Reduction of dislocation  Date/Time: 05/06/2021 1:17 PM Performed by: Sherwood Gambler, MD Authorized by: Sherwood Gambler, MD  Consent: Verbal consent obtained. Written consent obtained. Risks and benefits: risks, benefits and alternatives were discussed Consent given by: spouse Patient identity confirmed: verbally with patient Time out: Immediately prior to procedure a "time out" was called to verify the correct patient, procedure, equipment, support staff and site/side marked as required. Preparation: Patient was prepped and draped in the usual sterile fashion. Local anesthesia used: no  Anesthesia: Local anesthesia used: no  Sedation: Patient sedated: yes  Patient tolerance: patient tolerated the procedure well with no immediate complications Comments: Unsuccessful attempt - seemed to be reduced but wouldn't stay. Unclear if it was reducing and going back out or just nearly reduced and not over the cup.       Medications Ordered in ED Medications  propofol (DIPRIVAN) 10 mg/mL bolus/IV push (20 mg Intravenous Given 05/18/2021 1203)  propofol (DIPRIVAN) 10 mg/mL bolus/IV push (20 mg Intravenous Given 05/23/2021 1205)  propofol (DIPRIVAN) 10 mg/mL bolus/IV push (40 mg Intravenous Given 05/10/2021 1201)  aspirin EC tablet 81 mg (0 mg Oral Hold 05/23/2021 1426)  pantoprazole (PROTONIX) EC tablet 40 mg (0 mg Oral Hold 05/22/2021 1450)  acetaminophen (TYLENOL) tablet 650 mg (has no administration in time range)  calcium carbonate (OS-CAL) tablet  600 mg (0 mg Oral Hold 05/09/2021 1450)  DULoxetine (CYMBALTA) DR capsule 30 mg (0 mg Oral Hold 05/06/2021 1450)  carboxymethylcellulose (REFRESH PLUS) 0.5 % ophthalmic solution 1 drop (has no administration in time range)  metoprolol tartrate (LOPRESSOR) tablet 12.5 mg (0 mg Oral Hold 05/07/2021 1449)  predniSONE (DELTASONE) tablet 5 mg (has no administration in time range)  feeding supplement (ENSURE ENLIVE / ENSURE PLUS) liquid 237 mL (0 mLs Oral Hold 04/27/2021 1449)  senna-docusate (Senokot-S) tablet 2 tablet (has no administration in time range)  sulfaSALAzine (AZULFIDINE) tablet 500 mg ( Oral Canceled Entry 05/01/2021 1436)  tamsulosin (FLOMAX) capsule 0.4 mg (0 mg Oral Hold 05/24/2021 1449)  traZODone (DESYREL) tablet 25 mg (has no administration in time range)  multivitamin with minerals tablet 1 tablet (0 tablets Oral Hold 05/22/2021 1455)  fentaNYL (SUBLIMAZE) injection 50 mcg (50 mcg Intravenous Given 05/09/2021 1010)  propofol (DIPRIVAN) 10 mg/mL bolus/IV push 36 mg (80 mg Intravenous Given 05/03/2021 1206)  sodium chloride 0.9 % bolus 500 mL (500 mLs Intravenous Bolus 05/04/2021 1129)  fentaNYL (SUBLIMAZE) injection (50 mcg Intravenous Given 04/30/2021 1212)    ED Course/ Medical Decision Making/ A&P                           Medical Decision Making Amount and/or Complexity of Data Reviewed Labs: ordered. Radiology: ordered.  Risk Prescription drug management. Decision regarding  hospitalization.   After discussion with wife, we decided to try procedural sedation and hip reduction in the emergency department.  Unfortunately this did not reduce his hip.  Chart review prior to this did show that he has had 3 different hip dislocations in 2022 and all 3 had to go to the OR.  Thus I called Dr. Aline Brochure after the initial failed attempt.  He notes that the patient is a difficult case because he probably needs a revision or other hip surgery for this.  It is difficult to have him be compliant due to his dementia.  He advises for the patient to be admitted to the hospitalist service and he will talk to wife options.  Patient had lab work obtained given he is being admitted though he is otherwise well-appearing.  He has a mild leukocytosis that is probably reactive as his vital signs are otherwise normal.  At this point, the hospitalist was consulted and Dr. Dyann Kief will admit.  Dr. Dyann Kief        Final Clinical Impression(s) / ED Diagnoses Final diagnoses:  Closed dislocation of right hip, initial encounter [facility])    Rx / DC Orders ED Discharge Orders     None         Sherwood Gambler, MD 05/24/2021 [phone]

## 2021-05-11 NOTE — Assessment & Plan Note (Addendum)
No clinical signs of decompensation.

## 2021-05-11 NOTE — Assessment & Plan Note (Addendum)
HR 93 to 110, patient has been in pain. Continue pain control and symptomatic management.

## 2021-05-11 NOTE — Sedation Documentation (Signed)
Xray called to determine placement during procedure

## 2021-05-11 NOTE — Assessment & Plan Note (Addendum)
His blood pressure has been stable with systolic 012 this am. Continue pain control.

## 2021-05-11 NOTE — ED Notes (Signed)
Per Dr. Aline Brochure, hold all po food/meds at this time

## 2021-05-11 NOTE — H&P (Signed)
History and Physical    Patient: [name]:[id number] DOB: 10/24/1930 DOA: 04/28/2021 DOS: the patient was seen and examined on 04/27/2021 PCP: Lajean Manes, MD  Patient coming from: SNF  Chief Complaint:  Chief Complaint  Patient presents with   Hip Pain    Right side    HPI: [name] is a 88 y.o. male with medical history significant of abdominal aortic aneurysm, atrial fibrillation, rheumatoid arthritis, COPD/pulmonary fibrosis with chronic oxygen supplementation, BPH, dementia and history of coronary artery disease; who presented from the skilled nursing facility secondary to right-sided hip pain.  Pain excruciating uncontrolled, new and sudden in presentation.  No associated fall or injury.  Prior history of hip replacement. No fever, no chest pain, no shortness of breath, no nausea, no vomiting, no overt bleeding, no dysuria.  Work-up in the ED demonstrating dislocation of the right hip prosthesis.  Attempt by ED physician to reduce back in place failed.  Orthopedic surgery was consulted with recommendation to keep patient n.p.o. and to place in the hospital for further evaluation and management.  Patient is otherwise hemodynamically stable.  Review of Systems: As mentioned in the history of present illness. All other systems reviewed and are negative. Past Medical History:  Diagnosis Date   AAA (abdominal aortic aneurysm)    Anemia    Arthritis    ra   Atrial fibrillation (HCC)    COPD (chronic obstructive pulmonary disease) ([place]) 03/2014   Small amount of Emphysema- Pulmonary Fibrosis ( Feb. 9, 2016 )   Coronary atherosclerosis    Dysrhythmia    A-Fib   Hematuria, gross    HISTORY OF  ?  2004   Hypertension    dr Daneen Schick   Immune disorder [facility])    Myocardial infarction ([place]) 2000   Pneumonia    Pulmonary fibrosis ([place])    Rheumatoid arthritis ([place])    Shortness of breath    with exertion   Staph skin infection    Back   Past Surgical  History:  Procedure Laterality Date   ABDOMINAL AORTIC ENDOVASCULAR STENT GRAFT N/A 01/06/2016   Procedure: ABDOMINAL AORTIC ENDOVASCULAR STENT GRAFT;  Surgeon: Waynetta Sandy, MD;  Location: [facility] OR;  Service: Vascular;  Laterality: N/A;   arm surgery     result of staph infection   BACK SURGERY     CORONARY ANGIOPLASTY     ESOPHAGOGASTRODUODENOSCOPY  2004    ?   esophagus wrapped     EYE SURGERY Bilateral 03-14-13   [place]   HAND SURGERY     rel to [place] Right 06/30/2020   Procedure: CLOSED REDUCTION HIP;  Surgeon: Carole Civil, MD;  Location: [facility];  Service: Orthopedics;  Laterality: Right;   JOINT REPLACEMENT     bil hips   JOINT REPLACEMENT Right 01-20-13   Knee   stents  2000   TOTAL KNEE ARTHROPLASTY Right 01/20/2013   Procedure: RIGHT TOTAL KNEE ARTHROPLASTY;  Surgeon: Kerin Salen, MD;  Location: [place];  Service: Orthopedics;  Laterality: Right;   Social History:  reports that he quit smoking about 43 years ago. His smoking use included cigarettes. He has a 150.00 pack-year smoking history. He has quit using smokeless tobacco.  His smokeless tobacco use included chew. He reports that he does not drink alcohol and does not use drugs.  Allergies  Allergen Reactions   Morphine Other (See Comments)    "makes me crazy"  Morphine And Related Nausea And Vomiting and Other (See Comments)    Hallucinations    Nirmatrelvir-Ritonavir Other (See Comments)    Other reaction(s): confusion     Family History  Problem Relation Age of Onset   Heart disease Brother        Heart Disease before age 81   Stroke Brother    Heart attack Brother    Hypertension Brother    Diabetes Son    Heart disease Son        Heart Disease before age 88   Hypertension Son    Heart attack Son    Bleeding Disorder Father    COPD Father        Emphysema   Varicose Veins Sister    COPD Sister     Prior to Admission medications   Medication Sig Start  Date End Date Taking? Authorizing Provider  acetaminophen (TYLENOL) 325 MG tablet Take 2 tablets (650 mg total) by mouth every 4 (four) hours as needed. Patient taking differently: Take 650 mg by mouth every 6 (six) hours as needed for mild pain. 05/04/21 05/04/22 Yes Roxan Hockey, MD  aspirin 81 MG tablet Take 1 tablet (81 mg total) by mouth daily with breakfast. 05/04/21  Yes Emokpae, Courage, MD  bisacodyl (DULCOLAX) 10 MG suppository Place 1 suppository (10 mg total) rectally See admin instructions. Every Monday and Friday only 05/04/21  Yes Emokpae, Courage, MD  calcium carbonate (OS-CAL) 600 MG TABS Take 600 mg by mouth daily.   Yes [provider]  carboxymethylcellulose (REFRESH PLUS) 0.5 % SOLN 1 drop 3 (three) times daily as needed (dry eyes).   Yes [provider]  DULoxetine (CYMBALTA) 30 MG capsule Take 30 mg by mouth daily.   Yes [provider]  ferrous sulfate 325 (65 FE) MG tablet Take 325 mg by mouth daily with breakfast.   Yes [provider]  ipratropium (ATROVENT) 0.02 % nebulizer solution Take 2.5 mLs (0.5 mg total) by nebulization every 6 (six) hours as needed for wheezing or shortness of breath. 05/04/21  Yes Emokpae, Courage, MD  methocarbamol (ROBAXIN) 500 MG tablet Take 500 mg by mouth daily as needed for muscle spasms (Before Therapy).   Yes [provider]  metoprolol tartrate (LOPRESSOR) 25 MG tablet Take 0.5 tablets (12.5 mg total) by mouth 2 (two) times daily. 12/29/20  Yes Belva Crome, MD  Multiple Vitamin (MULTIVITAMIN) capsule Take 1 capsule by mouth daily. Therems with folic acid 527POE   Yes [provider]  predniSONE (DELTASONE) 5 MG tablet Take 5 mg by mouth daily with breakfast.   Yes [provider]  senna-docusate (SENOKOT-S) 8.6-50 MG tablet Take 2 tablets by mouth at bedtime. 05/04/21 05/04/22 Yes Emokpae, Courage, MD  sulfaSALAzine (AZULFIDINE) 500 MG tablet Take 500 mg by mouth in the morning, at  noon, in the evening, and at bedtime.   Yes [provider]  tamsulosin (FLOMAX) 0.4 MG CAPS capsule Take 0.4 mg by mouth daily.   Yes [provider]  traZODone (DESYREL) 50 MG tablet Take 0.5 tablets (25 mg total) by mouth at bedtime. Patient taking differently: Take 50 mg by mouth at bedtime. 05/04/21  Yes Emokpae, Courage, MD  Vitamin D, Ergocalciferol, (DRISDOL) 1.25 MG (50000 UNIT) CAPS capsule Take 50,000 Units by mouth every 7 (seven) days. Sundays   Yes [provider]  Nutritional Supplements (ENSURE ENLIVE PO) Take by mouth. between meals secondary to poor meal intake and risk for malnutrition Twice  A Day    [provider]  OXYGEN Inhale 2 L into the lungs. Document O2 sat qshift. Every Shift    [provider]    Physical Exam: Vitals:   05/20/2021 1420 04/29/2021 1430 05/19/2021 1925 05/10/2021 1954  BP: 136/63 (!) 118/54 126/69 137/63  Pulse: 94 89 98 (!) 108  Resp: 19  14 19   Temp: 98.7 F (37.1 C)   98.5 F (36.9 C)  TempSrc: Oral   Oral  SpO2: 98% 100% 94% (!) 84%  Weight:    68.3 kg  Height:    5\' 11"  (1.803 m)   General exam: Alert, awake, able to follow simple commands; afebrile and in no acute distress. Respiratory system: Positive scattered rhonchi; no wheezing, no using accessory muscles.  Good saturation on chronic supplementation. Cardiovascular system: Rate controlled, no rubs, no gallops, no JVD. Gastrointestinal system: Abdomen is nondistended, soft and nontender. No organomegaly or masses felt. Normal bowel sounds heard. Central nervous system: No focal deficit. Extremities: No cyanosis or clubbing; trace edema appreciated bilaterally.  Patient reports right hip pain and currently not moving his leg. Skin: No petechiae. Psychiatry: Mood & affect appropriate.    Data Reviewed: COVID by PCR panel negative Stable/within normal limits basic metabolic panel with normal renal function and electrolytes. CBC with WBCs of  12.6, hemoglobin 10.6 and normal platelets count.  Assessment and Plan: * Dislocation of hip joint prosthesis [facility]) -Orthopedic service has been contacted with anticipation for surgical intervention to get hip back into place. -Continue as needed analgesics -Continue supportive care -Hold anticoagulation/DVT prophylaxis in anticipation for surgery -Follow orthopedic service recommendations.  Vascular dementia without behavioral disturbance ([place])- (present on admission) - Admitted from the skilled nursing facility. -Mentation appears to be stable and baseline currently. -Continue supportive care and assistance.  Chronic diastolic (congestive) heart failure ([place])- (present on admission) - Appears to be stable and compensated continue to follow daily weights, strict I's and O's and once diet resumed aim for low-sodium diet.  Abdominal aortic aneurysm without rupture- (present on admission) -Overall stable -Plan continue to follow with vascular surgery as an patient.  Essential hypertension- (present on admission) - Overall stable and well-controlled -Continue current antihypertensive agent -Follow vital signs.  Pulmonary fibrosis assoc with RA - (present on admission) - Continue chronic oxygen supplementation and resume home bronchodilator management. -Currently no wheezing or complaining of shortness of breath.  -Good oxygen saturation appreciated.  Paroxysmal atrial fibrillation ([place])- (present on admission) -Rate controlled -Patient denies palpitations  -Continue aspirin; no chronic anticoagulation due to fall risk.   CAD (coronary artery disease), native coronary artery- (present on admission) -No chest pain, no shortness of breath -Continue cardiac medications and risk factor modification. -Continue outpatient follow-up with cardiology service.  Gastroesophageal reflux disease -Continue PPI.  History of BPH -Continue Flomax  History of depression -Continue  supportive care, consult reorientation and resume the use of Cymbalta.   Advance Care Planning:   Code Status: DNR   Consults: Orthopedic service  Family Communication: Wife at bedside.  Severity of Illness: The appropriate patient status for this patient is OBSERVATION. Observation status is judged to be reasonable and necessary in order to provide the required intensity of service to ensure the patient's safety. The patient's presenting symptoms, physical exam findings, and initial radiographic and laboratory data in the context of their medical condition is felt to place them at decreased risk for further clinical deterioration. Furthermore, it is anticipated that the patient will be medically stable  for discharge from the hospital within 2 midnights of admission.   Author: Barton Dubois, MD 05/16/2021 8:05 PM  For on call review [email or web address].

## 2021-05-11 NOTE — Anesthesia Preprocedure Evaluation (Deleted)
Anesthesia Evaluation  Patient identified by MRN, date of birth, ID band Patient awake    Reviewed: Allergy & Precautions, H&P , NPO status , Patient's Chart, lab work & pertinent test results, reviewed documented beta blocker date and time   Airway Mallampati: II  TM Distance: >3 FB Neck ROM: full    Dental no notable dental hx.    Pulmonary neg pulmonary ROS, former smoker,    Pulmonary exam normal breath sounds clear to auscultation       Cardiovascular Exercise Tolerance: Good hypertension, + CAD, + Past MI and +CHF  + dysrhythmias Atrial Fibrillation  Rhythm:regular Rate:Normal     Neuro/Psych PSYCHIATRIC DISORDERS Dementia negative neurological ROS     GI/Hepatic negative GI ROS, Neg liver ROS,   Endo/Other  negative endocrine ROS  Renal/GU negative Renal ROS  negative genitourinary   Musculoskeletal   Abdominal   Peds  Hematology  (+) Blood dyscrasia, anemia ,   Anesthesia Other Findings Pulmonary Fibrosis RA   Reproductive/Obstetrics negative OB ROS                             Anesthesia Physical Anesthesia Plan  ASA: 4 and emergent  Anesthesia Plan: General   Post-op Pain Management:    Induction:   PONV Risk Score and Plan:   Airway Management Planned:   Additional Equipment:   Intra-op Plan:   Post-operative Plan:   Informed Consent: I have reviewed the patients History and Physical, chart, labs and discussed the procedure including the risks, benefits and alternatives for the proposed anesthesia with the patient or authorized representative who has indicated his/her understanding and acceptance.     Dental Advisory Given  Plan Discussed with: CRNA  Anesthesia Plan Comments: (GA - possible muscle relaxation, possible ETT.)        Anesthesia Quick Evaluation

## 2021-05-11 NOTE — Assessment & Plan Note (Addendum)
Stable

## 2021-05-11 NOTE — Progress Notes (Signed)
Attempted to call report to ED nurse, nurse stated she will call back.

## 2021-05-12 ENCOUNTER — Encounter (HOSPITAL_COMMUNITY): Payer: Self-pay | Admitting: Internal Medicine

## 2021-05-12 ENCOUNTER — Observation Stay (HOSPITAL_BASED_OUTPATIENT_CLINIC_OR_DEPARTMENT_OTHER): Payer: Medicare Other | Admitting: Certified Registered"

## 2021-05-12 ENCOUNTER — Observation Stay (HOSPITAL_COMMUNITY): Payer: Medicare Other

## 2021-05-12 ENCOUNTER — Encounter (HOSPITAL_COMMUNITY): Admission: EM | Disposition: E | Payer: Self-pay | Source: Skilled Nursing Facility | Attending: Internal Medicine

## 2021-05-12 ENCOUNTER — Observation Stay (HOSPITAL_COMMUNITY): Payer: Medicare Other | Admitting: Certified Registered"

## 2021-05-12 DIAGNOSIS — S73004A Unspecified dislocation of right hip, initial encounter: Secondary | ICD-10-CM

## 2021-05-12 DIAGNOSIS — I2510 Atherosclerotic heart disease of native coronary artery without angina pectoris: Secondary | ICD-10-CM | POA: Diagnosis not present

## 2021-05-12 DIAGNOSIS — Z515 Encounter for palliative care: Secondary | ICD-10-CM

## 2021-05-12 DIAGNOSIS — T84029A Dislocation of unspecified internal joint prosthesis, initial encounter: Secondary | ICD-10-CM | POA: Diagnosis not present

## 2021-05-12 DIAGNOSIS — F418 Other specified anxiety disorders: Secondary | ICD-10-CM

## 2021-05-12 DIAGNOSIS — I10 Essential (primary) hypertension: Secondary | ICD-10-CM | POA: Diagnosis not present

## 2021-05-12 DIAGNOSIS — R638 Other symptoms and signs concerning food and fluid intake: Secondary | ICD-10-CM

## 2021-05-12 DIAGNOSIS — Z96649 Presence of unspecified artificial hip joint: Secondary | ICD-10-CM

## 2021-05-12 DIAGNOSIS — Z7189 Other specified counseling: Secondary | ICD-10-CM | POA: Diagnosis not present

## 2021-05-12 DIAGNOSIS — T84020A Dislocation of internal right hip prosthesis, initial encounter: Principal | ICD-10-CM

## 2021-05-12 DIAGNOSIS — I714 Abdominal aortic aneurysm, without rupture, unspecified: Secondary | ICD-10-CM | POA: Diagnosis not present

## 2021-05-12 DIAGNOSIS — J449 Chronic obstructive pulmonary disease, unspecified: Secondary | ICD-10-CM

## 2021-05-12 DIAGNOSIS — R410 Disorientation, unspecified: Secondary | ICD-10-CM

## 2021-05-12 DIAGNOSIS — I5032 Chronic diastolic (congestive) heart failure: Secondary | ICD-10-CM | POA: Diagnosis not present

## 2021-05-12 HISTORY — PX: HIP CLOSED REDUCTION: SHX983

## 2021-05-12 LAB — BASIC METABOLIC PANEL
Anion gap: 14 (ref 5–15)
BUN: 17 mg/dL (ref 8–23)
CO2: 21 mmol/L — ABNORMAL LOW (ref 22–32)
Calcium: 9 mg/dL (ref 8.9–10.3)
Chloride: 102 mmol/L (ref 98–111)
Creatinine, Ser: 0.61 mg/dL (ref 0.61–1.24)
GFR, Estimated: >60 mL/min (ref >60)
Glucose, Bld: 67 mg/dL — ABNORMAL LOW (ref 70–99)
Potassium: 4.2 mmol/L (ref 3.5–5.1)
Sodium: 137 mmol/L (ref 135–145)

## 2021-05-12 LAB — CBC
HCT: 33 % — ABNORMAL LOW (ref 39.0–52.0)
Hemoglobin: 9.8 g/dL — ABNORMAL LOW (ref 13.0–17.0)
MCH: 30.5 pg (ref 26.0–34.0)
MCHC: 29.7 g/dL — ABNORMAL LOW (ref 30.0–36.0)
MCV: 102.8 fL — ABNORMAL HIGH (ref 80.0–100.0)
Platelets: 574 10*3/uL — ABNORMAL HIGH (ref 150–400)
RBC: 3.21 MIL/uL — ABNORMAL LOW (ref 4.22–5.81)
RDW: 14 % (ref 11.5–15.5)
WBC: 13.6 10*3/uL — ABNORMAL HIGH (ref 4.0–10.5)
nRBC: 0 % (ref 0.0–0.2)

## 2021-05-12 LAB — SURGICAL PCR SCREEN
MRSA, PCR: NEGATIVE
Staphylococcus aureus: NEGATIVE

## 2021-05-12 SURGERY — CLOSED REDUCTION, HIP
Anesthesia: General | Site: Hip | Laterality: Right

## 2021-05-12 MED ORDER — FENTANYL CITRATE PF 50 MCG/ML IJ SOSY: 25.0000 ug | PREFILLED_SYRINGE | INTRAMUSCULAR | Status: DC | PRN

## 2021-05-12 MED ORDER — ENOXAPARIN SODIUM 40 MG/0.4ML IJ SOSY
40.0000 mg | PREFILLED_SYRINGE | INTRAMUSCULAR | Status: DC
  Administered 2021-05-13 – 2021-05-14 (×2): 40 mg via SUBCUTANEOUS
  Filled 2021-05-12 (×2): qty 0.4

## 2021-05-12 MED ORDER — CHLORHEXIDINE GLUCONATE 4 % EX LIQD
60.0000 mL | Freq: Once | CUTANEOUS | Status: AC
  Administered 2021-05-12: 4 via TOPICAL
  Filled 2021-05-12: qty 60

## 2021-05-12 MED ORDER — PROPOFOL 10 MG/ML IV BOLUS
INTRAVENOUS | Status: AC
  Filled 2021-05-12: qty 20

## 2021-05-12 MED ORDER — POVIDONE-IODINE 10 % EX SWAB: 2.0000 "application " | Freq: Once | CUTANEOUS | Status: DC

## 2021-05-12 MED ORDER — PROPOFOL 10 MG/ML IV BOLUS
INTRAVENOUS | Status: DC | PRN
  Administered 2021-05-12: 80 mg via INTRAVENOUS

## 2021-05-12 MED ORDER — ONDANSETRON HCL 4 MG/2ML IJ SOLN: 4.0000 mg | Freq: Four times a day (QID) | INTRAMUSCULAR | Status: DC | PRN

## 2021-05-12 MED ORDER — METOCLOPRAMIDE HCL 5 MG PO TABS
5.0000 mg | ORAL_TABLET | Freq: Three times a day (TID) | ORAL | Status: DC | PRN
  Filled 2021-05-12: qty 2

## 2021-05-12 MED ORDER — DOCUSATE SODIUM 100 MG PO CAPS
100.0000 mg | ORAL_CAPSULE | Freq: Two times a day (BID) | ORAL | Status: DC
  Administered 2021-05-12 – 2021-05-13 (×3): 100 mg via ORAL
  Filled 2021-05-12 (×4): qty 1

## 2021-05-12 MED ORDER — CHLORHEXIDINE GLUCONATE 4 % EX LIQD
Freq: Once | CUTANEOUS | Status: AC
  Filled 2021-05-12: qty 15

## 2021-05-12 MED ORDER — LIDOCAINE HCL (CARDIAC) PF 100 MG/5ML IV SOSY
PREFILLED_SYRINGE | INTRAVENOUS | Status: DC | PRN
  Administered 2021-05-12: 50 mg via INTRAVENOUS

## 2021-05-12 MED ORDER — ENOXAPARIN SODIUM 30 MG/0.3ML IJ SOSY: 30.0000 mg | PREFILLED_SYRINGE | INTRAMUSCULAR | Status: DC

## 2021-05-12 MED ORDER — PHENOL 1.4 % MT LIQD
1.0000 | OROMUCOSAL | Status: DC | PRN
  Filled 2021-05-12: qty 177

## 2021-05-12 MED ORDER — ONDANSETRON HCL 4 MG/2ML IJ SOLN: 4.0000 mg | Freq: Once | INTRAMUSCULAR | Status: DC | PRN

## 2021-05-12 MED ORDER — LACTATED RINGERS IV SOLN: INTRAVENOUS | Status: DC

## 2021-05-12 MED ORDER — METOCLOPRAMIDE HCL 5 MG/ML IJ SOLN: 5.0000 mg | Freq: Three times a day (TID) | INTRAMUSCULAR | Status: DC | PRN

## 2021-05-12 MED ORDER — ONDANSETRON HCL 4 MG PO TABS
4.0000 mg | ORAL_TABLET | Freq: Four times a day (QID) | ORAL | Status: DC | PRN
  Filled 2021-05-12: qty 1

## 2021-05-12 MED ORDER — SUCCINYLCHOLINE CHLORIDE 200 MG/10ML IV SOSY
PREFILLED_SYRINGE | INTRAVENOUS | Status: DC | PRN
  Administered 2021-05-12: 100 mg via INTRAVENOUS

## 2021-05-12 MED ORDER — MENTHOL 3 MG MT LOZG
1.0000 | LOZENGE | OROMUCOSAL | Status: DC | PRN
  Filled 2021-05-12: qty 9

## 2021-05-12 MED ORDER — ORAL CARE MOUTH RINSE: 15.0000 mL | Freq: Once | OROMUCOSAL | Status: DC

## 2021-05-12 MED ORDER — CHLORHEXIDINE GLUCONATE 0.12 % MT SOLN: 15.0000 mL | Freq: Once | OROMUCOSAL | Status: DC

## 2021-05-12 MED ORDER — PHENYLEPHRINE HCL (PRESSORS) 10 MG/ML IV SOLN
INTRAVENOUS | Status: DC | PRN
Start: 2021-05-12 — End: 2021-05-12
  Administered 2021-05-12: 120 ug via INTRAVENOUS
  Administered 2021-05-12 (×3): 200 ug via INTRAVENOUS
  Administered 2021-05-12: 80 ug via INTRAVENOUS

## 2021-05-12 SURGICAL SUPPLY — 3 items
CLOTH BEACON ORANGE TIMEOUT ST (SAFETY) ×2 IMPLANT
IMMOBILIZER KNEE 19 UNV (ORTHOPEDIC SUPPLIES) ×1 IMPLANT
KIT TURNOVER KIT A (KITS) ×2 IMPLANT

## 2021-05-12 NOTE — Anesthesia Postprocedure Evaluation (Signed)
Anesthesia Post Note  Patient: JAMOND NEELS  Procedure(s) Performed: CLOSED REDUCTION HIP (Right: Hip)  Patient location during evaluation: PACU Anesthesia Type: General Level of consciousness: awake and alert Pain management: pain level controlled Vital Signs Assessment: post-procedure vital signs reviewed and stable Respiratory status: spontaneous breathing, nonlabored ventilation, respiratory function stable and patient connected to nasal cannula oxygen Cardiovascular status: blood pressure returned to baseline and stable Postop Assessment: no apparent nausea or vomiting Anesthetic complications: no   No notable events documented.   Last Vitals:  Vitals:   05/16/2021 0628 05/01/2021 1425  BP: (!) 101/50 110/84  Pulse: 94 95  Resp: 20 (!) 23  Temp: 36.8 C 36.8 C  SpO2: 92% 100%    Last Pain:  Vitals:   04/28/2021 1425  TempSrc: Oral  PainSc: 0-No pain                 Louann Sjogren

## 2021-05-12 NOTE — Anesthesia Procedure Notes (Signed)
Procedure Name: Intubation Date/Time: 05/10/2021 2:44 PM Performed by: Jonna Munro, CRNA Pre-anesthesia Checklist: Patient identified, Emergency Drugs available, Suction available, Patient being monitored and Timeout performed Patient Re-evaluated:Patient Re-evaluated prior to induction Oxygen Delivery Method: Circle system utilized Preoxygenation: Pre-oxygenation with 100% oxygen Induction Type: IV induction and Rapid sequence Laryngoscope Size: Mac and 3 Grade View: Grade I Tube type: Oral Number of attempts: 1 Airway Equipment and Method: Stylet Placement Confirmation: ETT inserted through vocal cords under direct vision, positive ETCO2 and breath sounds checked- equal and bilateral Secured at: 22 cm Tube secured with: Tape Dental Injury: Teeth and Oropharynx as per pre-operative assessment

## 2021-05-12 NOTE — Consult Note (Signed)
Palliative Care Consult Note                                  Date: 04/27/2021   Patient Name: [name]  DOB: 1930-12-11  MRN: [id number]  Age / Sex: 88 y.o., male  PCP: Lajean Manes, MD Referring Physician: Barton Dubois, MD  Reason for Consultation: Establishing goals of care  HPI/Patient Profile: 88 y.o. male  with past medical history of abdominal aortic aneurysm, atrial fibrillation, rheumatoid arthritis, COPD/pulmonary fibrosis with chronic oxygen supplementation, BPH, dementia and history of coronary artery disease. He presented with hip pain. He was admitted on 04/30/2021 with dislocation of hip joint prosthesis, vascular dementia without behavioral disturbance, chronic diastolic heart failure, A-fib, and others.   PMT was consulted for [place] conversations  Past Medical History:  Diagnosis Date   AAA (abdominal aortic aneurysm)    Anemia    Arthritis    ra   Atrial fibrillation ([place])    COPD (chronic obstructive pulmonary disease) ([place]) 03/2014   Small amount of Emphysema- Pulmonary Fibrosis ( Feb. 9, 2016 )   Coronary atherosclerosis    Dysrhythmia    A-Fib   Hematuria, gross    HISTORY OF  ?  2004   Hypertension    dr Daneen Schick   Immune disorder [facility])    Myocardial infarction ([place]) 2000   Pneumonia    Pulmonary fibrosis ([place])    Rheumatoid arthritis ([place])    Shortness of breath    with exertion   Staph skin infection    Back    Subjective:   This NP [name] reviewed medical records, received report from team, assessed the patient and then meet at the patient's bedside to discuss diagnosis, prognosis, GOC, EOL wishes disposition and options.  I met with the patient's wife [name] at the bedside.  The patient was soundly sleeping and I elected not to wake him.   Concept of Palliative Care was introduced as specialized medical care for people and their families living with serious  illness.  If focuses on providing relief from the symptoms and stress of a serious illness.  The goal is to improve quality of life for both the patient and the family. Values and goals of care important to patient and family were attempted to be elicited.  Created space and opportunity for patient  and family to explore thoughts and feelings regarding current medical situation   Natural trajectory and current clinical status were discussed. Questions and concerns addressed. Patient  encouraged to call with questions or concerns.    Patient/Family Understanding of Illness: They understand that he was at the [facility] for rehab and "something happened" wherein he ran everybody out of his room and the following morning he noted hip pain and his hip was dislocated.  At baseline he has dementia with behavioral issues and his wife shares that "home has not been home for many months."  We had further discussion about dementia, the trajectory of illness, the incurable, insidious, progressive nature of dementia and the opinion that he is likely at end-stage dementia.  His wife seems to understand and agree.  Life Review: He and his wife have been married for 51 years.  Have 7 sons, multiple grandkids, and some great-grandchildren.  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  His #1 joy is his children  and grandchildren as well as his dog, which is a Pomeranian/2-hour max.  However, when the patient's wife has brought the dog to visit him in the skilled nursing facility he seems to not really recognize the dog and not have the same relationship he previously did.  Patient Values: Family (especially kids and grandkids)  Goals: Comfort, dignity, surgery for hip relocation (palliative/comfort focused)  Today's Discussion: In addition to the life review and extensive pathophysiological discussion we had, we had further discussion on multiple areas.   The patient's wife shares that he has had a poor quality of life with his ongoing decline in dementia.  He has been at the [facility] for rehab and due to various issues such as staffing and etc. they are only able to check on him about every 2-3 hours which leaves plenty of opportunity for him to injure himself.  He does not understand his boundaries.  We discussed his wife caring for him at home over these years and she states she is exhausted.  However, she tends to not except assistance offers from family as "they have their own lives and children."  We discussed the need to have a support system and to except assistance; offering help in helping with his care may be her children's way of dealing with her own grief.  She seemed to understand.  Recently his appetite has not been good and pretty sporadic.  Typically eats less than 25% of his meals.  She tries to bring him foods that he typically enjoys and he still does not really eat much.  She does have availability of adequate family support.  She would like him to go back to [facility] in order to utilize the 20 days he has remaining and then potentially stay at [facility] for long-term care/private pay with hospice support versus going home with home hospice and private duty nursing assistance.  I discussed with transitions of care the current plan as well as the medical team and nursing team.    I offered emotional and general support through therapeutic listening, therapeutic silence, and therapeutic touch, empathy, sharing of stories, and other techniques.  I answered all questions and addressed all concerns to the best my ability.  Review of Systems  Unable to perform ROS: Dementia   Objective:   Primary Diagnoses: Present on Admission:  CAD (coronary artery disease), native coronary artery  Paroxysmal atrial fibrillation (HCC)  Abdominal aortic aneurysm without rupture  Essential hypertension  Pulmonary fibrosis assoc with  RA   Chronic diastolic (congestive) heart failure ([place])  Vascular dementia without behavioral disturbance ([place])   Physical Exam Vitals and nursing note reviewed.  Constitutional:      General: He is sleeping. He is not in acute distress.    Appearance: He is ill-appearing. He is not toxic-appearing.  HENT:     Head: Normocephalic and atraumatic.  Pulmonary:     Effort: Pulmonary effort is normal. No respiratory distress.  Abdominal:     General: Abdomen is flat.    Vital Signs:  BP (!) 101/50 (BP Location: Right Arm)    Pulse 94    Temp 98.3 F (36.8 C) (Tympanic)    Resp 20    Ht '5\' 11"'  (1.803 m)    Wt 68.3 kg    SpO2 92%    BMI 21.00 kg/m   Palliative Assessment/Data: 30-40%    Advanced Care Planning:   Primary Decision Maker: NEXT OF KIN  Code Status/Advance Care Planning:  DNR  A discussion was had today regarding advanced directives. Concepts specific to code status, artifical feeding and hydration, continued IV antibiotics and rehospitalization was had.  The difference between a aggressive medical intervention path and a palliative comfort care path for this patient at this time was had.   Decisions/Changes to ACP: No changes today  Assessment & Plan:   Impression: 88 year old male with advanced dementia and frequently dislocated hip who presents with hip pain and noted to again be dislocated.  He is planned to go to surgery today for closed reduction of his hip.  Overall he appears to be approaching end-of-life.  Wife is on board with engaging with hospice services.  Her plan is to hopefully get back to [facility] for his remaining 20 days and either stay there as a private pay patient with hospice in place versus go home with hospice support and private duty care assistance.  Overall prognosis is poor.  SUMMARY OF RECOMMENDATIONS   Surgery as planned as a palliative measure Remain DNR Emotional general support of patient and family Work on discharge  planning Hold off on hospice referral (waiting to finish using remaining 20 days at [facility]) PMT will continue to follow  Symptom Management:  Per primary team PMT is available to assist as needed  Prognosis:  < 6 months  Discharge Planning:  To Be Determined   Discussed with: Patient's family, medical team, nursing team.    Thank you for allowing [place] to participate in the care of [name] PMT will continue to support holistically.  Time Total: 75 min  Greater than 50%  of this time was spent counseling and coordinating care related to the above assessment and plan.  Signed by: [name] Field, NP Palliative Medicine Team  Team Phone # [phone] (Nights/Weekends)  05/11/2021, 1:05 PM

## 2021-05-12 NOTE — Progress Notes (Signed)
Patient denies pain after surgery. He did well with supper he ate 75% of his dinner and about 50% of his ensure. This nurse will notify wife.

## 2021-05-12 NOTE — Anesthesia Preprocedure Evaluation (Signed)
Anesthesia Evaluation  Patient identified by MRN, date of birth, ID band Patient awake    Reviewed: Allergy & Precautions, H&P , NPO status , Patient's Chart, lab work & pertinent test results, reviewed documented beta blocker date and time   Airway Mallampati: II  TM Distance: >3 FB Neck ROM: full    Dental no notable dental hx.    Pulmonary shortness of breath, COPD, former smoker,    Pulmonary exam normal breath sounds clear to auscultation       Cardiovascular Exercise Tolerance: Good hypertension, + CAD and + Past MI   Rhythm:regular Rate:Normal     Neuro/Psych PSYCHIATRIC DISORDERS Dementia negative neurological ROS     GI/Hepatic negative GI ROS, Neg liver ROS,   Endo/Other  negative endocrine ROS  Renal/GU negative Renal ROS  negative genitourinary   Musculoskeletal   Abdominal   Peds  Hematology  (+) Blood dyscrasia, anemia ,   Anesthesia Other Findings   Reproductive/Obstetrics negative OB ROS                             Anesthesia Physical Anesthesia Plan  ASA: 4  Anesthesia Plan: General and General ETT   Post-op Pain Management:    Induction:   PONV Risk Score and Plan: Ondansetron  Airway Management Planned:   Additional Equipment:   Intra-op Plan:   Post-operative Plan:   Informed Consent: I have reviewed the patients History and Physical, chart, labs and discussed the procedure including the risks, benefits and alternatives for the proposed anesthesia with the patient or authorized representative who has indicated his/her understanding and acceptance.     Dental Advisory Given  Plan Discussed with: CRNA  Anesthesia Plan Comments:         Anesthesia Quick Evaluation

## 2021-05-12 NOTE — TOC Initial Note (Signed)
Transition of Care [facility]) - Initial/Assessment Note    Patient Details  Name: [name] MRN: [id number] Date of Birth: 10-15-1930  [facility] CM/SW Contact:    Iona Beard, [place] Phone Number: 05/24/2021, 12:18 PM  Clinical Narrative:                 TOC made aware that pt is from [facility] for SNF. CSW spoke with pts wife who states that as for right now they are still planning for pt to return to [facility] for SNF. CSW spoke with Marianna Fuss at [facility] who states they can accept pt back. Pt will need auth restarted prior to admission to [facility]. CSW also updated by pts wife that she has been speaking with Randall Hiss the palliative NP. She is also waiting to speak with the MD to confirm they feel pt can rehab. At this time she is still planning for him to return to [facility] TOC to work on starting [place]. TOC to follow.   Expected Discharge Plan: Skilled Nursing Facility Barriers to Discharge: Continued Medical Work up   Patient Goals and CMS Choice Patient states their goals for this hospitalization and ongoing recovery are:: Return to SNF CMS Medicare.gov Compare Post Acute Care list provided to:: Patient Represenative (must comment) Choice offered to / list presented to : Spouse  Expected Discharge Plan and Services Expected Discharge Plan: [place] In-house Referral: Clinical Social Work   Post Acute Care Choice: Resumption of Svcs/PTA Provider Living arrangements for the past 2 months: [place]                                      Prior Living Arrangements/Services Living arrangements for the past 2 months: [place] Lives with:: Facility Resident   Do you feel safe going back to the place where you live?: Yes      Need for Family Participation in Patient Care: Yes (Comment) Care giver support system in place?: Yes (comment)   Criminal Activity/Legal Involvement Pertinent to Current Situation/Hospitalization: No -  Comment as needed  Activities of Daily Living Home Assistive Devices/Equipment: Environmental consultant (specify type) ADL Screening (condition at time of admission) Patient's cognitive ability adequate to safely complete daily activities?: No Is the patient deaf or have difficulty hearing?: Yes Does the patient have difficulty seeing, even when wearing glasses/contacts?: Yes Does the patient have difficulty concentrating, remembering, or making decisions?: Yes Patient able to express need for assistance with ADLs?: No Does the patient have difficulty dressing or bathing?: Yes Independently performs ADLs?: No Communication: Needs assistance Is this a change from baseline?: Pre-admission baseline Dressing (OT): Needs assistance Is this a change from baseline?: Pre-admission baseline Grooming: Needs assistance Is this a change from baseline?: Pre-admission baseline Feeding: Independent with device (comment) Is this a change from baseline?: Pre-admission baseline Bathing: Needs assistance Is this a change from baseline?: Pre-admission baseline Toileting: Needs assistance Is this a change from baseline?: Pre-admission baseline In/Out Bed: Needs assistance Is this a change from baseline?: Pre-admission baseline Walks in Home: Dependent Is this a change from baseline?: Pre-admission baseline Does the patient have difficulty walking or climbing stairs?: Yes Weakness of Legs: Both Weakness of Arms/Hands: Both  Permission Sought/Granted                  Emotional Assessment       Orientation: : Oriented  to Self Alcohol / Substance Use: Not Applicable Psych Involvement: No (comment)  Admission diagnosis:  Dislocation of hip joint prosthesis ([place]) [N27.782U, Z96.649] Closed dislocation of right hip, initial encounter ([place]) [S73.004A] Patient Active Problem List   Diagnosis Date Noted   Dislocation of hip joint prosthesis ([place]) 05/20/2021   Iron deficiency anemia due to chronic blood loss  05/01/2021   Sepsis ([place]) 05/01/2021   Unspecified protein-calorie malnutrition ([place]) 04/30/2021   Closed left hip fracture ([place]) 04/29/2021   Rheumatoid arteritis ([place]) 04/29/2021   Chronic respiratory failure with hypoxia (HCC) 04/29/2021   Chronic diastolic (congestive) heart failure ([place]) 04/29/2021   HCAP (healthcare-associated pneumonia) 04/29/2021   Vascular dementia without behavioral disturbance ([place]) 04/29/2021   Hip dislocation, right ([place]) 06/30/2020   Asymptomatic COVID-19 virus infection 06/30/2020   Abdominal aortic aneurysm without rupture 01/06/2016   Essential hypertension 11/04/2014   Pulmonary fibrosis assoc with RA  04/07/2014   CAD (coronary artery disease), native coronary artery 02/26/2014   Paroxysmal atrial fibrillation ([place]) 02/26/2014   Arthritis of knee, right 01/20/2013   PCP:  Lajean Manes, MD Pharmacy:   [facility], [place] - [address] [place] [address] Phone: [phone] Fax: [phone]  [place] ([place], [place] [address] Phone: [phone] Fax: [phone]  [id number] - [place], [place] - [address] [place] [address] Phone: [phone] Fax: [place], [place] [address] [place] [address] Phone: [phone] Fax: [phone]     Social Determinants of Health (SDOH) Interventions    Readmission Risk Interventions Readmission Risk Prevention Plan 05/04/2021 05/02/2021  Transportation Screening - Complete  HRI or Home Care Consult Complete -  Social Work Consult for [place] Planning/Counseling Complete -  Palliative Care Screening Complete -  Medication Review Press photographer) Complete Complete  HRI or [place] - Complete  SW Recovery  Care/Counseling Consult - Complete  Palliative Care Screening - Not Applicable  Skilled Nursing Facility - Complete  Some recent data might be hidden

## 2021-05-12 NOTE — Transfer of Care (Signed)
Immediate Anesthesia Transfer of Care Note  Patient: [name]  Procedure(s) Performed: CLOSED REDUCTION HIP (Right: Hip)  Patient Location: PACU  Anesthesia Type:General  Level of Consciousness: awake, alert  and patient cooperative  Airway & Oxygen Therapy: Patient Spontanous Breathing and Patient connected to face mask oxygen  Post-op Assessment: Report given to RN, Post -op Vital signs reviewed and stable and Patient moving all extremities X 4  Post vital signs: Reviewed and stable  Last Vitals:  Vitals Value Taken Time  BP 141/73 05/22/2021 1515  Temp    Pulse 70 05/20/2021 1516  Resp 28 05/01/2021 1516  SpO2 100 % 05/22/2021 1516  Vitals shown include unvalidated device data.  Last Pain:  Vitals:   05/02/2021 1425  TempSrc: Oral  PainSc: 0-No pain      Patients Stated Pain Goal:  (unable to  state pain goal) (84/66/59 [id number])  Complications: No notable events documented.

## 2021-05-12 NOTE — Op Note (Signed)
04/28/2021  4:09 PM  PATIENT:  [name]  88 y.o. male  PRE-OPERATIVE DIAGNOSIS:  dislocation right total hip  POST-OPERATIVE DIAGNOSIS:  dislocation right total hip  PROCEDURE:  Procedure(s): CLOSED REDUCTION HIP (Right)  Findings the hip was subluxated 1 dislocatable posteriorly with the leg flexed at the hip and knee  The reduction maneuver required hip flexion slight adduction countertraction of the pelvis with a sheet in the right groin.  Films showed the hip to be reduced after above-mentioned technique  SURGEON:  Surgeon(s) and Role:    * Carole Civil, MD - Primary    * Mordecai Rasmussen, MD - Assisting  Details of procedure the patient was brought back to the operating room given general anesthesia in supine position timeout was confirmed as the patient was anesthetized prior to that  The first reduction maneuver was straight traction which did not produce reduction  We then placed a sheet in the right groin and with traction countertraction with the leg flexed at the hip and knee audible clunk and visible clunk was noted and leg lengths were restored x-ray confirmed reduction  Patient extubated taken to recovery room in stable condition  PHYSICIAN ASSISTANT:   ASSISTANTS: none   ANESTHESIA:   general  EBL:  none   BLOOD ADMINISTERED:none  DRAINS: none   LOCAL MEDICATIONS USED:  MARCAINE     SPECIMEN:  No Specimen  DISPOSITION OF SPECIMEN:  N/A  COUNTS:  YES  TOURNIQUET:  * No tourniquets in log *  DICTATION: .Dragon Dictation  PLAN OF CARE: Admit to inpatient   PATIENT DISPOSITION:  PACU - hemodynamically stable.   Delay start of Pharmacological VTE agent (>24hrs) due to surgical blood loss or risk of bleeding: not applicable

## 2021-05-12 NOTE — NC FL2 (Signed)
[place] MEDICAID FL2 LEVEL OF CARE SCREENING TOOL     IDENTIFICATION  Patient Name: [name] Birthdate: 09/29/1930 Sex: male Admission Date (Current Location): 05/20/2021  [facility] and [place] Number:  [employer] and Address:  [facility] [address], [place]      Provider Number: [id number]  Attending Physician Name and Address:  Barton Dubois, MD  Relative Name and Phone Number:  Mcanelly,Charlotte (Spouse) [phone]    Current Level of Care: Hospital Recommended Level of Care: [place] Prior Approval Number:    Date Approved/Denied:   PASRR Number:    Discharge Plan: SNF    Current Diagnoses: Patient Active Problem List   Diagnosis Date Noted   Dislocation of hip joint prosthesis ([place]) 05/24/2021   Iron deficiency anemia due to chronic blood loss 05/01/2021   Sepsis ([place]) 05/01/2021   Unspecified protein-calorie malnutrition ([place]) 04/30/2021   Closed left hip fracture ([place]) 04/29/2021   Rheumatoid arteritis ([place]) 04/29/2021   Chronic respiratory failure with hypoxia ([place]) 04/29/2021   Chronic diastolic (congestive) heart failure ([place]) 04/29/2021   HCAP (healthcare-associated pneumonia) 04/29/2021   Vascular dementia without behavioral disturbance ([place]) 04/29/2021   Hip dislocation, right ([place]) 06/30/2020   Asymptomatic COVID-19 virus infection 06/30/2020   Abdominal aortic aneurysm without rupture 01/06/2016   Essential hypertension 11/04/2014   Pulmonary fibrosis assoc with RA  04/07/2014   CAD (coronary artery disease), native coronary artery 02/26/2014   Paroxysmal atrial fibrillation ([place]) 02/26/2014   Arthritis of knee, right 01/20/2013    Orientation RESPIRATION BLADDER Height & Weight     Self  O2 (3L) Incontinent Weight: 150 lb 9.2 oz (68.3 kg) Height:  5\' 11"  (180.3 cm)  BEHAVIORAL SYMPTOMS/MOOD NEUROLOGICAL BOWEL NUTRITION STATUS      Incontinent Diet (See d/c summary)  AMBULATORY  STATUS COMMUNICATION OF NEEDS Skin   Extensive Assist Verbally                         Personal Care Assistance Level of Assistance  Bathing, Feeding, Dressing Bathing Assistance: Maximum assistance Feeding assistance: Independent Dressing Assistance: Maximum assistance     Functional Limitations Info  Sight, Hearing, Speech Sight Info: Impaired Hearing Info: Adequate Speech Info: Adequate    SPECIAL CARE FACTORS FREQUENCY  PT (By licensed PT), OT (By licensed OT)     PT Frequency: 5 times weekly OT Frequency: 5 times weekly            Contractures Contractures Info: Not present    Additional Factors Info  Code Status, Allergies Code Status Info: DNR Allergies Info: Morphine, Morphine and Related, Nirmatrelvir-ritonavir Psychotropic Info: Cymbalta         Current Medications (05/10/2021):  This is the current hospital active medication list Current Facility-Administered Medications  Medication Dose Route Frequency Provider Last Rate Last Admin   0.9 %  sodium chloride infusion  250 mL Intravenous PRN Barton Dubois, MD       acetaminophen (TYLENOL) tablet 650 mg  650 mg Oral Q6H PRN Barton Dubois, MD       aspirin EC tablet 81 mg  81 mg Oral Daily Barton Dubois, MD   81 mg at 05/09/2021 3295   calcium carbonate (OS-CAL - dosed in mg of elemental calcium) tablet 500 mg of elemental calcium  1 tablet Oral Q breakfast Barton Dubois, MD   500 mg of elemental calcium at 05/17/2021 0815   DULoxetine (CYMBALTA) DR capsule  30 mg  30 mg Oral Daily Barton Dubois, MD   30 mg at 04/27/2021 0815   feeding supplement (ENSURE ENLIVE / ENSURE PLUS) liquid 237 mL  237 mL Oral BID BM Barton Dubois, MD   237 mL at 05/10/2021 1034   haloperidol lactate (HALDOL) injection 1 mg  1 mg Intravenous Q6H PRN Carole Civil, MD   1 mg at 05/20/2021 1333   HYDROmorphone (DILAUDID) injection 1 mg  1 mg Intravenous Q2H PRN Carole Civil, MD   1 mg at 05/09/2021 0818   metoprolol tartrate  (LOPRESSOR) tablet 12.5 mg  12.5 mg Oral BID Barton Dubois, MD   12.5 mg at 05/19/2021 0815   multivitamin with minerals tablet 1 tablet  1 tablet Oral Daily Barton Dubois, MD   1 tablet at 05/10/2021 0814   pantoprazole (PROTONIX) EC tablet 40 mg  40 mg Oral Daily Barton Dubois, MD   40 mg at 05/23/2021 7530   polyvinyl alcohol (LIQUIFILM TEARS) 1.4 % ophthalmic solution 1 drop  1 drop Both Eyes TID PRN Barton Dubois, MD       povidone-iodine 10 % swab 2 application  2 application Topical Once Carole Civil, MD       predniSONE (DELTASONE) tablet 5 mg  5 mg Oral Q breakfast Barton Dubois, MD   5 mg at 05/06/2021 0511   propofol (DIPRIVAN) 10 mg/mL bolus/IV push   Intravenous PRN Sherwood Gambler, MD   20 mg at 04/27/2021 1203   propofol (DIPRIVAN) 10 mg/mL bolus/IV push   Intravenous PRN Sherwood Gambler, MD   20 mg at  1205   propofol (DIPRIVAN) 10 mg/mL bolus/IV push   Intravenous PRN Sherwood Gambler, MD   40 mg at 05/09/2021 1201   senna-docusate (Senokot-S) tablet 2 tablet  2 tablet Oral QHS Barton Dubois, MD       sodium chloride flush (NS) 0.9 % injection 3 mL  3 mL Intravenous Q12H Barton Dubois, MD   3 mL at 05/20/2021 0818   sodium chloride flush (NS) 0.9 % injection 3 mL  3 mL Intravenous PRN Barton Dubois, MD       sulfaSALAzine (AZULFIDINE) tablet 500 mg  500 mg Oral Daily Barton Dubois, MD   500 mg at 05/20/2021 1033   tamsulosin (FLOMAX) capsule 0.4 mg  0.4 mg Oral Daily Barton Dubois, MD   0.4 mg at 04/30/2021 0815   traZODone (DESYREL) tablet 25 mg  25 mg Oral Morrell Riddle, MD   25 mg at 05/02/2021 2151     Discharge Medications: Please see discharge summary for a list of discharge medications.  Relevant Imaging Results:  Relevant Lab Results:   Additional Information SSN: [id number], [place]

## 2021-05-12 NOTE — Brief Op Note (Signed)
05/03/2021  4:09 PM  PATIENT:  [name]  88 y.o. male  PRE-OPERATIVE DIAGNOSIS:  dislocation right total hip  POST-OPERATIVE DIAGNOSIS:  dislocation right total hip  PROCEDURE:  Procedure(s): CLOSED REDUCTION HIP (Right)  Findings the hip was subluxated 1 dislocatable posteriorly with the leg flexed at the hip and knee  The reduction maneuver required hip flexion slight adduction countertraction of the pelvis with a sheet in the right groin.  Films showed the hip to be reduced after above-mentioned technique  SURGEON:  Surgeon(s) and Role:    * Carole Civil, MD - Primary    * Mordecai Rasmussen, MD - Assisting  PHYSICIAN ASSISTANT:   ASSISTANTS: none   ANESTHESIA:   general  EBL:  none   BLOOD ADMINISTERED:none  DRAINS: none   LOCAL MEDICATIONS USED:  MARCAINE     SPECIMEN:  No Specimen  DISPOSITION OF SPECIMEN:  N/A  COUNTS:  YES  TOURNIQUET:  * No tourniquets in log *  DICTATION: .Dragon Dictation  PLAN OF CARE: Admit to inpatient   PATIENT DISPOSITION:  PACU - hemodynamically stable.   Delay start of Pharmacological VTE agent (>24hrs) due to surgical blood loss or risk of bleeding: not applicable

## 2021-05-12 NOTE — Progress Notes (Signed)
Progress Note   Patient: [name] JME:[id number] DOB: 1931-03-20 DOA: 05/22/2021     0 DOS: the patient was seen and examined on 05/02/2021   Brief admission narrative [name] is a 88 y.o. male with medical history significant of abdominal aortic aneurysm, atrial fibrillation, rheumatoid arthritis, COPD/pulmonary fibrosis with chronic oxygen supplementation, BPH, dementia and history of coronary artery disease; who presented from the skilled nursing facility secondary to right-sided hip pain.  Pain excruciating uncontrolled, new and sudden in presentation.  No associated fall or injury.  Prior history of hip replacement. No fever, no chest pain, no shortness of breath, no nausea, no vomiting, no overt bleeding, no dysuria.   Work-up in the ED demonstrating dislocation of the right hip prosthesis.  Attempt by ED physician to reduce back in place failed.  Orthopedic surgery was consulted with recommendation to keep patient n.p.o. and to place in the hospital for further evaluation and management.  Patient is otherwise hemodynamically stable.  Assessment and Plan: * Dislocation of hip joint prosthesis ([place]) -Status post right hip closed reduction under anesthesia -Follow orthopedic therapy recommendations -Continue as needed analgesics -Continue supportive care -Will use SCDs for DVT prophylaxis -Per family plan is to return to skilled nursing facility at discharge.  Depression with anxiety- (present on admission) -Mood overall stable -Continue to use Cymbalta and trazodone.  Vascular dementia without behavioral disturbance ([place])- (present on admission) - Admitted from the skilled nursing facility. -Mentation appears to be stable and baseline currently. -Continue supportive care and assistance. -Discussed with palliative care has recommended continue to treat was treatable with transition to home with hospice after discharge from the skilled nursing facility.  Chronic  diastolic (congestive) heart failure ([place])- (present on admission) -Appears to be stable and compensated -Will continue to follow daily weights, strict I's and O's  -Continue to watch sodium intake.  Abdominal aortic aneurysm without rupture- (present on admission) -Overall stable -Plan continue to follow with vascular surgery as an patient.  Essential hypertension- (present on admission) -Overall stable and well-controlled -Continue current antihypertensive agent -Follow vital signs.  Pulmonary fibrosis assoc with RA - (present on admission) -Continue chronic oxygen supplementation and resume home bronchodilator management. -Currently no wheezing or complaining of shortness of breath.  -Good oxygen saturation appreciated on his chronic oxygen supplementation.  Paroxysmal atrial fibrillation (HCC)- (present on admission) -Rate controlled -No chest pain or palpitations. -Continue aspirin; no chronic anticoagulation due to fall risk.   CAD (coronary artery disease), native coronary artery- (present on admission) -No chest pain, no shortness of breath -Continue cardiac medications and risk factor modification. -Continue outpatient follow-up with cardiology service. -Condition overall stable.    Subjective:  A stable and in no acute distress; improvement in pain after hip reduction provided.  No fever, no nausea, no vomiting, no shortness of breath.  Chronically ill in appearance and weak/deconditioned.  Physical Exam: Vitals:   05/03/2021 1515 05/20/2021 1530 04/28/2021 1546 04/28/2021 1600  BP: (!) 141/73 (!) 104/59 (!) 104/51 (!) 100/54  Pulse: 71 69  64  Resp: (!) 25 (!) 30 (!) 26 20  Temp: 97.6 F (36.4 C)  98.3 F (36.8 C) 98 F (36.7 C)  TempSrc:    Oral  SpO2: 98%  98% 97%  Weight:      Height:       General exam: Alert, awake, oriented x 1, denies chest pain or shortness of breath currently. Respiratory system: Good air movement bilaterally, no using accessory muscles.   Good  saturation on chronic appointment patient. Cardiovascular system: Rate controlled, no rubs, no gallops, no JVD on exam.  Positive systolic ejection murmur appreciated on exam. Gastrointestinal system: Abdomen is nondistended, soft and nontender. No organomegaly or masses felt. Normal bowel sounds heard. Central nervous system: No new focal deficit appreciated.  Patient is able to follow commands. Extremities: No cyanosis or clubbing. Skin: No petechiae. Psychiatry: Judgment and insight impaired due to underlying dementia; affect appropriate.   Data Reviewed: Stable basic metabolic panel demonstrating sodium of 137, potassium 4.2 creatinine 0.61. Complete metabolic panel with a hemoglobin of 9.8 stable platelets count and WBCs of 13.6 without presence of acute infection.  Family Communication: Wife at bedside  Disposition: Status is: Observation    Planned Discharge Destination: Skilled nursing facility  Author: Barton Dubois, MD 04/27/2021 6:34 PM  For on call review [email or web address].

## 2021-05-12 NOTE — Brief Op Note (Signed)
05/07/2021  3:23 PM  PATIENT:  [name]  88 y.o. male  PRE-OPERATIVE DIAGNOSIS:  dislocation right total hip  POST-OPERATIVE DIAGNOSIS:  dislocation right total hip  PROCEDURE:  Procedure(s): CLOSED REDUCTION HIP (Right)  Findings the hip was subluxated 1 dislocatable posteriorly with the leg flexed at the hip and knee  The reduction maneuver required hip flexion slight adduction countertraction of the pelvis with a sheet in the right groin.  Films showed the hip to be reduced after above-mentioned technique  SURGEON:  Surgeon(s) and Role:    * Carole Civil, MD - Primary    * Mordecai Rasmussen, MD - Assisting  PHYSICIAN ASSISTANT:   ASSISTANTS: none   ANESTHESIA:   general  EBL:  none   BLOOD ADMINISTERED:none  DRAINS: none   LOCAL MEDICATIONS USED:  MARCAINE     SPECIMEN:  No Specimen  DISPOSITION OF SPECIMEN:  N/A  COUNTS:  YES  TOURNIQUET:  * No tourniquets in log *  DICTATION: .Dragon Dictation  PLAN OF CARE: Admit to inpatient   PATIENT DISPOSITION:  PACU - hemodynamically stable.   Delay start of Pharmacological VTE agent (>24hrs) due to surgical blood loss or risk of bleeding: not applicable

## 2021-05-12 NOTE — Progress Notes (Signed)
Patient going to need assistance with IS due to him being in hand mittens.

## 2021-05-12 NOTE — Addendum Note (Signed)
Addendum  created 05/07/2021 1531 by Jonna Munro, CRNA   Intraprocedure Event deleted, Intraprocedure Event edited

## 2021-05-12 NOTE — Assessment & Plan Note (Addendum)
Patient will be transitioned to comfort care.

## 2021-05-12 NOTE — Consult Note (Signed)
Consultation requested by Dr. Barton Dubois  Chief complaint dislocation right hip  This 88 year old male has had multiple dislocations of the right total hip prosthesis put in in 2004 by Dr. Mayer Camel in [place] after failure of fixation for hip fracture.  He has had multiple dislocations in relocations over the past year to 2 years visiting various emergency rooms for relocation.  He presents now after going to a local nursing home and complaining of hip pain no witnessed fall.  Emergency room evaluation revealed a dislocated right total hip prosthesis and after attempted closed reduction under conscious sedation the hip was not reduced.  The patient was scheduled for surgery on February 15 however the family wanted to get everyone to the hospital which caused a 2-hour delay and the surgery was postponed till today secondary to the patient's medical condition and not wanting to put him under anesthesia in the operating room after hours  His chief complaint is right hip pain it is confined to the right hip and proximal femur it is associated with his inability to weight-bear although he recently had a fracture to his left hip which did not require surgery and therefore had not been walking for some time.  That fracture was treated by a orthopedist in [place] of systems the patient has dementia and is confused the only information I have is from a relative who says that he was recently in the hospital and discharged and sent to the nursing home for convalescence  BP (!) 101/50 (BP Location: Right Arm)    Pulse 94    Temp 98.3 F (36.8 C) (Tympanic)    Resp 20    Ht 5\' 11"  (1.803 m)    Wt 68.3 kg    SpO2 92%    BMI 21.00 kg/m   He is awake he responds to commands at times he seems lucent but other times confused  He has no gross deformities his leg is actually shortened with hip flexion and rotation external  Pulses are intact with no edema in the lower extremities  Sciatic nerve function  is normal  He does have pain to palpation of the proximal hip his incision looks normal there is no signs of infection  Muscle tone is normal in all 4 extremities the right hip is unstable range of motion could not be tested in the right hip because of pain he has no palpable lymph nodes in the groin is toes have no pathologic reflexes Babinski is downgoing  Coordination and balance had to be deferred  Gait patient cannot walk  Mood pleasant affect somewhat fidgety he is pulling at everything and trying to pull all his leads and lines out.  Interpretation of images right hip shows a dislocated right total hip prosthesis and on attempt at reduction repeat images again show dislocated prosthesis  The patient be admitted for pain control and agitation control.  We will then have close reduction under anesthesia with maximum relaxation and then be placed in a knee immobilizer and again we will try to get him to see Dr. Mayer Camel who has seen him in the past and based on his medical history recommended nonoperative treatment  He does have a hip brace but his relative says that he can pull it off although it is difficult for him to do so  Diagnosis recurrent dislocation right total hip prosthesis  Plan closed reduction under anesthesia  I have asked Dr. Amedeo Kinsman to help me with the procedure depending on  our schedules family is aware  Past Medical History:  Diagnosis Date   AAA (abdominal aortic aneurysm)    Anemia    Arthritis    ra   Atrial fibrillation (HCC)    COPD (chronic obstructive pulmonary disease) ([place]) 03/2014   Small amount of Emphysema- Pulmonary Fibrosis ( Feb. 9, 2016 )   Coronary atherosclerosis    Dysrhythmia    A-Fib   Hematuria, gross    HISTORY OF  ?  2004   Hypertension    dr Daneen Schick   Immune disorder [facility])    Myocardial infarction ([place]) 2000   Pneumonia    Pulmonary fibrosis ([place])    Rheumatoid arthritis ([place])    Shortness of breath    with exertion    Staph skin infection    Back   Psh Family History  Problem Relation Age of Onset   Heart disease Brother        Heart Disease before age 35   Stroke Brother    Heart attack Brother    Hypertension Brother    Diabetes Son    Heart disease Son        Heart Disease before age 30   Hypertension Son    Heart attack Son    Bleeding Disorder Father    COPD Father        Emphysema   Varicose Veins Sister    COPD Sister    Social History   Tobacco Use   Smoking status: Former    Packs/day: 3.00    Years: 50.00    Pack years: 150.00    Types: Cigarettes    Quit date: 03/27/1978    Years since quitting: 43.1   Smokeless tobacco: Former    Types: Chew  Substance Use Topics   Alcohol use: No    Alcohol/week: 0.0 standard drinks   Drug use: No   Allergies  Allergen Reactions   Morphine Other (See Comments)    "makes me crazy"   Morphine And Related Nausea And Vomiting and Other (See Comments)    Hallucinations    Nirmatrelvir-Ritonavir Other (See Comments)    Other reaction(s): confusion     Current Facility-Administered Medications:    0.9 %  sodium chloride infusion, 250 mL, Intravenous, PRN, Barton Dubois, MD   acetaminophen (TYLENOL) tablet 650 mg, 650 mg, Oral, Q6H PRN, Barton Dubois, MD   aspirin EC tablet 81 mg, 81 mg, Oral, Daily, Barton Dubois, MD   calcium carbonate (OS-CAL - dosed in mg of elemental calcium) tablet 500 mg of elemental calcium, 1 tablet, Oral, Q breakfast, Barton Dubois, MD   DULoxetine (CYMBALTA) DR capsule 30 mg, 30 mg, Oral, Daily, Barton Dubois, MD   feeding supplement (ENSURE ENLIVE / ENSURE PLUS) liquid 237 mL, 237 mL, Oral, BID BM, Barton Dubois, MD   haloperidol lactate (HALDOL) injection 1 mg, 1 mg, Intravenous, Q6H PRN, Carole Civil, MD, 1 mg at 05/22/2021 0000   HYDROmorphone (DILAUDID) injection 1 mg, 1 mg, Intravenous, Q2H PRN, Carole Civil, MD, 1 mg at 05/16/2021 0252   metoprolol tartrate (LOPRESSOR) tablet 12.5 mg,  12.5 mg, Oral, BID, Barton Dubois, MD, 12.5 mg at 05/10/2021 2151   multivitamin with minerals tablet 1 tablet, 1 tablet, Oral, Daily, Barton Dubois, MD   pantoprazole (PROTONIX) EC tablet 40 mg, 40 mg, Oral, Daily, Barton Dubois, MD   polyvinyl alcohol (LIQUIFILM TEARS) 1.4 % ophthalmic solution 1 drop, 1 drop, Both Eyes, TID PRN, Barton Dubois, MD  predniSONE (DELTASONE) tablet 5 mg, 5 mg, Oral, Q breakfast, Barton Dubois, MD   propofol (DIPRIVAN) 10 mg/mL bolus/IV push, , Intravenous, PRN, Sherwood Gambler, MD, 20 mg at 05/17/2021 1203   propofol (DIPRIVAN) 10 mg/mL bolus/IV push, , Intravenous, PRN, Sherwood Gambler, MD, 20 mg at 05/20/2021 1205   propofol (DIPRIVAN) 10 mg/mL bolus/IV push, , Intravenous, PRN, Sherwood Gambler, MD, 40 mg at 05/09/2021 1201   senna-docusate (Senokot-S) tablet 2 tablet, 2 tablet, Oral, QHS, Barton Dubois, MD   sodium chloride flush (NS) 0.9 % injection 3 mL, 3 mL, Intravenous, Q12H, Barton Dubois, MD, 3 mL at 05/08/2021 2155   sodium chloride flush (NS) 0.9 % injection 3 mL, 3 mL, Intravenous, PRN, Barton Dubois, MD   sulfaSALAzine (AZULFIDINE) tablet 500 mg, 500 mg, Oral, Daily, Barton Dubois, MD   tamsulosin (FLOMAX) capsule 0.4 mg, 0.4 mg, Oral, Daily, Barton Dubois, MD   traZODone (DESYREL) tablet 25 mg, 25 mg, Oral, Morrell Riddle, MD, 25 mg at 05/20/2021 2151

## 2021-05-12 NOTE — Progress Notes (Signed)
Wasn't able to get an accurate reading on the Dinamap. I grabbed the portable pulse ox that RT uses and it showed accurate sats. Will report off to oncoming nurse to use that.

## 2021-05-12 NOTE — Care Management Obs Status (Signed)
[place] NOTIFICATION   Patient Details  Name: [name] MRN: [id number] Date of Birth: Nov 12, 1930   Medicare Observation Status Notification Given:  Yes    [name] 05/24/2021, 4:44 PM

## 2021-05-13 DIAGNOSIS — Z9981 Dependence on supplemental oxygen: Secondary | ICD-10-CM | POA: Diagnosis not present

## 2021-05-13 DIAGNOSIS — I252 Old myocardial infarction: Secondary | ICD-10-CM | POA: Diagnosis not present

## 2021-05-13 DIAGNOSIS — S73006A Unspecified dislocation of unspecified hip, initial encounter: Secondary | ICD-10-CM | POA: Insufficient documentation

## 2021-05-13 DIAGNOSIS — M9702XA Periprosthetic fracture around internal prosthetic left hip joint, initial encounter: Secondary | ICD-10-CM | POA: Diagnosis present

## 2021-05-13 DIAGNOSIS — I5032 Chronic diastolic (congestive) heart failure: Secondary | ICD-10-CM | POA: Diagnosis present

## 2021-05-13 DIAGNOSIS — M069 Rheumatoid arthritis, unspecified: Secondary | ICD-10-CM | POA: Diagnosis present

## 2021-05-13 DIAGNOSIS — Z20822 Contact with and (suspected) exposure to covid-19: Secondary | ICD-10-CM | POA: Diagnosis present

## 2021-05-13 DIAGNOSIS — T84029A Dislocation of unspecified internal joint prosthesis, initial encounter: Secondary | ICD-10-CM | POA: Diagnosis present

## 2021-05-13 DIAGNOSIS — I714 Abdominal aortic aneurysm, without rupture, unspecified: Secondary | ICD-10-CM | POA: Diagnosis present

## 2021-05-13 DIAGNOSIS — Z96643 Presence of artificial hip joint, bilateral: Secondary | ICD-10-CM | POA: Diagnosis present

## 2021-05-13 DIAGNOSIS — S72112A Displaced fracture of greater trochanter of left femur, initial encounter for closed fracture: Secondary | ICD-10-CM | POA: Diagnosis present

## 2021-05-13 DIAGNOSIS — J449 Chronic obstructive pulmonary disease, unspecified: Secondary | ICD-10-CM | POA: Diagnosis present

## 2021-05-13 DIAGNOSIS — J8410 Pulmonary fibrosis, unspecified: Secondary | ICD-10-CM | POA: Diagnosis present

## 2021-05-13 DIAGNOSIS — I110 Hypertensive heart disease with heart failure: Secondary | ICD-10-CM | POA: Diagnosis present

## 2021-05-13 DIAGNOSIS — J9611 Chronic respiratory failure with hypoxia: Secondary | ICD-10-CM | POA: Diagnosis present

## 2021-05-13 DIAGNOSIS — I2510 Atherosclerotic heart disease of native coronary artery without angina pectoris: Secondary | ICD-10-CM | POA: Diagnosis present

## 2021-05-13 DIAGNOSIS — I480 Paroxysmal atrial fibrillation: Secondary | ICD-10-CM | POA: Diagnosis present

## 2021-05-13 DIAGNOSIS — K219 Gastro-esophageal reflux disease without esophagitis: Secondary | ICD-10-CM | POA: Diagnosis present

## 2021-05-13 DIAGNOSIS — F05 Delirium due to known physiological condition: Secondary | ICD-10-CM | POA: Diagnosis not present

## 2021-05-13 DIAGNOSIS — Y792 Prosthetic and other implants, materials and accessory orthopedic devices associated with adverse incidents: Secondary | ICD-10-CM | POA: Diagnosis present

## 2021-05-13 DIAGNOSIS — F0150 Vascular dementia without behavioral disturbance: Secondary | ICD-10-CM | POA: Diagnosis present

## 2021-05-13 DIAGNOSIS — T84020A Dislocation of internal right hip prosthesis, initial encounter: Secondary | ICD-10-CM | POA: Diagnosis present

## 2021-05-13 DIAGNOSIS — G9341 Metabolic encephalopathy: Secondary | ICD-10-CM | POA: Diagnosis not present

## 2021-05-13 DIAGNOSIS — F418 Other specified anxiety disorders: Secondary | ICD-10-CM | POA: Diagnosis present

## 2021-05-13 DIAGNOSIS — Z66 Do not resuscitate: Secondary | ICD-10-CM | POA: Diagnosis present

## 2021-05-13 DIAGNOSIS — Z515 Encounter for palliative care: Secondary | ICD-10-CM | POA: Diagnosis not present

## 2021-05-13 DIAGNOSIS — N400 Enlarged prostate without lower urinary tract symptoms: Secondary | ICD-10-CM | POA: Diagnosis present

## 2021-05-13 MED ORDER — DEXTROSE IN LACTATED RINGERS 5 % IV SOLN: INTRAVENOUS | Status: DC

## 2021-05-13 MED ORDER — OXYCODONE HCL 5 MG PO TABS: 5.0000 mg | ORAL_TABLET | ORAL | Status: DC | PRN

## 2021-05-13 NOTE — Plan of Care (Signed)
°  Problem: Acute Rehab PT Goals(only PT should resolve) Goal: Pt Will Go Supine/Side To Sit Outcome: Progressing Flowsheets (Taken 05/13/2021 1401) Pt will go Supine/Side to Sit: with moderate assist Goal: Patient Will Transfer Sit To/From Stand Outcome: Progressing Flowsheets (Taken 05/13/2021 1401) Patient will transfer sit to/from stand: with moderate assist Goal: Pt Will Transfer Bed To Chair/Chair To Bed Outcome: Progressing Flowsheets (Taken 05/13/2021 1401) Pt will Transfer Bed to Chair/Chair to Bed: with mod assist Goal: Pt Will Ambulate Outcome: Progressing Flowsheets (Taken 05/13/2021 1401) Pt will Ambulate:  10 feet  with moderate assist  with rolling walker   2:01 PM, 05/13/21 Lonell Grandchild, MPT Physical Therapist with [facility] [phone] office [phone] mobile phone

## 2021-05-13 NOTE — Progress Notes (Signed)
Subjective: 1 Day Post-Op Procedure(s) (LRB): CLOSED REDUCTION HIP (Right) Patient reports pain as  cannot report pain but family indicates patient had pain when the hip was moved by PT .    Objective: Vital signs in last 24 hours: Temp:  [97.6 F (36.4 C)-98.5 F (36.9 C)] 98.4 F (36.9 C) (02/17 0619) Pulse Rate:  [64-108] 108 (02/17 0619) Resp:  [17-30] 17 (02/17 0619) BP: (100-156)/(51-84) 106/64 (02/17 0619) SpO2:  [86 %-100 %] 94 % (02/17 0619) Weight:  [70.2 kg] 70.2 kg (02/17 0545)  Intake/Output from previous day: 02/16 0701 - 02/17 0700 In: 1801 [P.O.:385; I.V.:1416] Out: 850 [Urine:850] Intake/Output this shift: No intake/output data recorded.  Recent Labs    04/29/2021 1235 05/19/2021 0448  HGB 10.6* 9.8*   Recent Labs    04/30/2021 1235 05/23/2021 0448  WBC 12.6* 13.6*  RBC 3.26* 3.21*  HCT 33.5* 33.0*  PLT 662* 574*   Recent Labs    05/22/2021 1235 05/09/2021 0448  NA 138 137  K 4.5 4.2  CL 102 102  CO2 26 21*  BUN 17 17  CREATININE 0.69 0.61  GLUCOSE 87 67*  CALCIUM 9.6 9.0   No results for input(s): LABPT, INR in the last 72 hours.  Hip arthrosis and knee immobilizer are both in place, both legs are externally rotated which is a normal position for his extremities   Assessment/Plan: 1 Day Post-Op Procedure(s) (LRB): CLOSED REDUCTION HIP (Right)  At discharge and transfer to SNF  The hip orthosis stays on all the time.  The knee orthosis/immobilizer stays on all the time except when physical therapy is working with the patient  Posterior hip precautions limit hip flexion to 90 degrees or less  F/U are to be scheduled with Dr Mayer Camel in [place], (surgeon for tha )   Weightbearing full on the right partial on the left for the next 4 weeks  Diagnosis #1 recurrent dislocating total hip prosthesis  #2 nondisplaced proximal left femur fracture, periprosthetic round left total hip (that fracture is approximately 75 weeks old occurring on around  January 30)   Arther Abbott 05/13/2021, 10:47 AM

## 2021-05-13 NOTE — TOC Progression Note (Addendum)
Transition of Care [facility]) - Progression Note    Patient Details  Name: [name] MRN: [id number] Date of Birth: May 05, 1930  Transition of Care [facility] CM/SW [place], [place] Phone Number: 05/13/2021, 2:24 PM  Clinical Narrative:    TOC to start insurance auth for pt for SNF at [facility] when medically ready for D/C. CSW updated Kerri with [facility] of auth being started. CSW also updated pts wife of this.  TOC to follow with updates.   Expected Discharge Plan: Skilled Nursing Facility Barriers to Discharge: Continued Medical Work up  Expected Discharge Plan and Services Expected Discharge Plan: [place] In-house Referral: Clinical Social Work   Post Acute Care Choice: Resumption of Svcs/PTA Provider Living arrangements for the past 2 months: Skilled Nursing Facility                                       Social Determinants of Health (SDOH) Interventions    Readmission Risk Interventions Readmission Risk Prevention Plan 05/04/2021 05/02/2021  Transportation Screening - Complete  HRI or Home Care Consult Complete -  Social Work Consult for [place] Planning/Counseling Complete -  Palliative Care Screening Complete -  Medication Review Press photographer) Complete Complete  HRI or [place] - Complete  SW Recovery Care/Counseling Consult - Complete  Palliative Care Screening - Not Applicable  Skilled Nursing Facility - Complete  Some recent data might be hidden

## 2021-05-13 NOTE — Hospital Course (Addendum)
[name] was admitted to the hospital with the working diagnosis of dislocation of total right hip.  Worsening encephalopathy and delirium, transitioned to comfort care.   88 yo male with the past medical history of atrial fibrillation, abdominal aortic aneurysm, COPD, rheumatoid arthritis, and pulmonary fibrosis with chronic hypoxemic respiratory failure, who presented with right hip pain. Patient had acute onset of severe pain at the right hip that prompted his transfer to the hospital, no history of recent fall. On his initial physical examination his blood pressure was 136/63, HR 94, RR 19, and oxygen saturation 98%, his lungs were clear to auscultation, heart with S1 and S2 present with no gallops, abdomen soft and trace bilaterally lower extremity edema, positive pain at the right hip joint.   Na 138, K 4,5 CL 102. Bicarb 26, glucose 87, bun 17 and cr 0,69 Wbc 12,6 hgb 10,6 hct 33,5 plt 662  Sars covid 19 negative  Urine analysis with SG 1,025 negative leukocytes.   Pelvic radiograph with persistent dislocation of the tight total hip arthroplasty.   Patient was placed on analgesics and orthopedics was consulted.  02/16 patient underwent closed reduction of right hip.   02/18 worsening encephalopathy and delirium with persistent pain.  No oral intake and poor responsiveness Patient with poor prognosis, will transition to comfort care.

## 2021-05-13 NOTE — Plan of Care (Signed)

## 2021-05-13 NOTE — Evaluation (Signed)
Physical Therapy Evaluation Patient Details Name: [name] MRN: [id number] DOB: [birth year] Today's Date: 05/13/2021  History of Present Illness  [name] is a 88 y.o. male s/p CLOSED REDUCTION HIP (Right) on 05/02/2021 with medical history significant of abdominal aortic aneurysm, atrial fibrillation, rheumatoid arthritis, COPD/pulmonary fibrosis with chronic oxygen supplementation, BPH, dementia and history of coronary artery disease; who presented from the skilled nursing facility secondary to right-sided hip pain.  Pain excruciating uncontrolled, new and sudden in presentation.  No associated fall or injury.  Prior history of hip replacement.  No fever, no chest pain, no shortness of breath, no nausea, no vomiting, no overt bleeding, no dysuria.     Work-up in the ED demonstrating dislocation of the right hip prosthesis.  Attempt by ED physician to reduce back in place failed.  Orthopedic surgery was consulted with recommendation to keep patient n.p.o. and to place in the hospital for further evaluation and management.  Patient is otherwise hemodynamically stable.   Clinical Impression  Patient demonstrates slow labored movement for sitting up at bedside requiring mod/max assist for supine to sitting, unable to maintain standing balance when attempting sit to stands using RW due to BLE weakness and 50% WB precaution LLE, and required stand pivot with Max assist to transfer to chair.  Patient tolerated sitting up for approximately 2 hours before requesting to go back to bed.  Patient will benefit from continued skilled physical therapy in hospital and recommended venue below to increase strength, balance, endurance for safe ADLs and gait.      Recommendations for follow up therapy are one component of a multi-disciplinary discharge planning process, led by the attending physician.  Recommendations may be updated based on patient status, additional functional criteria and insurance  authorization.  Follow Up Recommendations Skilled nursing-short term rehab (<3 hours/day)    Assistance Recommended at Discharge Intermittent Supervision/Assistance  Patient can return home with the following  A lot of help with bathing/dressing/bathroom;A lot of help with walking and/or transfers;Help with stairs or ramp for entrance;Assistance with cooking/housework    Equipment Recommendations None recommended by PT  Recommendations for Other Services       Functional Status Assessment Patient has had a recent decline in their functional status and demonstrates the ability to make significant improvements in function in a reasonable and predictable amount of time.     Precautions / Restrictions Precautions Precautions: Fall Required Braces or Orthoses: Other Brace;Knee Immobilizer - Right Other Brace: Abduction brace right hip Restrictions Weight Bearing Restrictions: Yes LLE Weight Bearing: Partial weight bearing LLE Partial Weight Bearing Percentage or Pounds: 50% WB LLE Other Position/Activity Restrictions: right knee immobilizer on at all times except when working with rehab staff, Abduction brace right hip at all times      Mobility  Bed Mobility Overal bed mobility: Needs Assistance Bed Mobility: Supine to Sit     Supine to sit: Mod assist, Max assist     General bed mobility comments: slow labored movement requiring repeated verbal/tactile cueing    Transfers Overall transfer level: Needs assistance Equipment used: 1 person hand held assist, None Transfers: Sit to/from Stand Sit to Stand: Max assist Stand pivot transfers: Max assist         General transfer comment: very unsteady on feet and unable to transfer using RW, required Max assist stand pivot    Ambulation/Gait                  Stairs  Wheelchair Mobility    Modified Rankin (Stroke Patients Only)       Balance Overall balance assessment: Needs  assistance Sitting-balance support: Feet supported, No upper extremity supported Sitting balance-Leahy Scale: Poor Sitting balance - Comments: fair/poor seated at EOB   Standing balance support: Reliant on assistive device for balance, During functional activity, Bilateral upper extremity supported Standing balance-Leahy Scale: Poor Standing balance comment: poor with hand held assist stand pivot                             Pertinent Vitals/Pain Pain Assessment Pain Assessment: Faces Faces Pain Scale: Hurts little more Pain Location: left hip Pain Descriptors / Indicators: Sore, Grimacing Pain Intervention(s): Limited activity within patient's tolerance, Monitored during session, Premedicated before session, Repositioned    Home Living Family/patient expects to be discharged to:: Private residence Living Arrangements: Spouse/significant other Available Help at Discharge: Family;Available 24 hours/day Type of Home: House Home Access: Stairs to enter Entrance Stairs-Rails: Left Entrance Stairs-Number of Steps: 3   Home Layout: One level Home Equipment: Conservation officer, nature (2 wheels);Wheelchair - manual;Cane - single point      Prior Function Prior Level of Function : Independent/Modified Independent             Mobility Comments: Short distanced community ambulator using SPC ADLs Comments: Assited by family for all ADL's and IADL's.     Hand Dominance   Dominant Hand: Right    Extremity/Trunk Assessment   Upper Extremity Assessment Upper Extremity Assessment: Generalized weakness    Lower Extremity Assessment Lower Extremity Assessment: Generalized weakness;RLE deficits/detail;LLE deficits/detail RLE Deficits / Details: grossly 3+/5 RLE: Unable to fully assess due to pain RLE Sensation: WNL RLE Coordination: WNL LLE Deficits / Details: grossly -3/5 LLE: Unable to fully assess due to pain LLE Sensation: WNL LLE Coordination: WNL    Cervical / Trunk  Assessment Cervical / Trunk Assessment: Normal  Communication   Communication: Other (comment) (slightly lethargic)  Cognition Arousal/Alertness: Awake/alert Behavior During Therapy: WFL for tasks assessed/performed Overall Cognitive Status: History of cognitive impairments - at baseline                                          General Comments      Exercises     Assessment/Plan    PT Assessment Patient needs continued PT services  PT Problem List Decreased strength;Decreased activity tolerance;Decreased balance;Decreased mobility       PT Treatment Interventions DME instruction;Gait training;Stair training;Functional mobility training;Therapeutic exercise;Therapeutic activities;Balance training;Patient/family education    PT Goals (Current goals can be found in the Care Plan section)  Acute Rehab PT Goals Patient Stated Goal: return home after rehab PT Goal Formulation: With patient/family Time For Goal Achievement: 05/27/21 Potential to Achieve Goals: Good    Frequency Min 3X/week     Co-evaluation               AM-PAC PT "6 Clicks" Mobility  Outcome Measure Help needed turning from your back to your side while in a flat bed without using bedrails?: A Lot Help needed moving from lying on your back to sitting on the side of a flat bed without using bedrails?: A Lot Help needed moving to and from a bed to a chair (including a wheelchair)?: A Lot Help needed standing up from a chair using your arms (e.g.,  wheelchair or bedside chair)?: A Lot Help needed to walk in hospital room?: Total Help needed climbing 3-5 steps with a railing? : Total 6 Click Score: 10    End of Session Equipment Utilized During Treatment: Oxygen Activity Tolerance: Patient tolerated treatment well;Patient limited by fatigue Patient left: in chair;with call bell/phone within reach Nurse Communication: Mobility status PT Visit Diagnosis: Unsteadiness on feet  (R26.81);Other abnormalities of gait and mobility (R26.89);Muscle weakness (generalized) (M62.81)    Time: 1587-2761 PT Time Calculation (min) (ACUTE ONLY): 42 min   Charges:   PT Evaluation $PT Eval High Complexity: 1 High PT Treatments $Therapeutic Exercise: 38-52 mins        1:59 PM, 05/13/21 Lonell Grandchild, MPT Physical Therapist with [facility] [phone] office [phone] mobile phone

## 2021-05-13 NOTE — Progress Notes (Signed)
Progress Note   Patient: [name] YQI:[id number] DOB: 02/12/1931 DOA: 05/04/2021     0 DOS: the patient was seen and examined on 05/13/2021   Brief hospital course: [name] was admitted to the hospital with the working diagnosis of dislocation of total right hip.   88 yo male with the past medical history of atrial fibrillation, abdominal aortic aneurysm, COPD, rheumatoid arthritis, and pulmonary fibrosis with chronic hypoxemic respiratory failure, who presented with right hip pain. Patient had acute onset of severe pain at the right hip that prompted his transfer to the hospital, no history of recent fall. On his initial physical examination his blood pressure was 136/63, HR 94, RR 19, and oxygen saturation 98%, his lungs were clear to auscultation, heart with S1 and S2 present with no gallops, abdomen soft and trace bilaterally lower extremity edema, positive pain at the right hip joint.   Na 138, K 4,5 CL 102. Bicarb 26, glucose 87, bun 17 and cr 0,69 Wbc 12,6 hgb 10,6 hct 33,5 plt 662  Sars covid 19 negative  Urine analysis with SG 1,025 negative leukocytes.   Pelvic radiograph with persistent dislocation of the tight total hip arthroplasty.   Patient was placed on analgesics and orthopedics was consulted.  02/16 patient underwent closed reduction of right hip.     Assessment and Plan: * Dislocation of hip joint prosthesis ([place]) Patient continue to have pain at the right hip with decrease mobility.  Plan to continue pain control and PT/ OT.   Patient on hydromorphone IV, will add oral oxycodone and continue close monitoring.   Vascular dementia without behavioral disturbance ([place])- (present on admission) Patient has been more confused and agitated this am, had episode with difficulty managing secretions that required suction.  Patient has bilateral mittens. Positive NEW delirium.   Plan to continue neuro checks per unit protocol. Continue with aspiration precautions  and consult speech for evaluation. Continue with as needed haloperidol for agitation.  Patient with poor oral intake will add balanced electrolyte solutions with dextrose.   Depression with anxiety- (present on admission) Patient on trazodone and cymbalta.   Chronic diastolic (congestive) heart failure (HCC)- (present on admission) No clinical signs of decompensation, continue with blood pressure monitoring and rate control for atrial fibrillation   Abdominal aortic aneurysm without rupture- (present on admission) Out patient follow up .   Essential hypertension- (present on admission) Continue blood pressure control with metoprolol.   Pulmonary fibrosis assoc with RA - (present on admission) Continue with supplemental 02 per [place] to keep 02 saturation of 88% or greater.   Paroxysmal atrial fibrillation ([place])- (present on admission) Continue rate control with metoprolol 12.5 mg bid, not on anticoagulation due to risk of fall.    CAD (coronary artery disease), native coronary artery- (present on admission) Patient with no chest pain. Continue with medical therapy         Subjective: Patient has been more confuse and combative compared to his baseline, poor oral intake and recurrent pain   Physical Exam: Vitals:   05/19/2021 2009 05/13/21 0030 05/13/21 0545 05/13/21 0619  BP: (!) 119/59 (!) 118/58  106/64  Pulse: (!) 106 97  (!) 108  Resp: 19 20  17   Temp: 98.5 F (36.9 C) 98.4 F (36.9 C)  98.4 F (36.9 C)  TempSrc: Oral Axillary    SpO2: 92% 94%  94%  Weight:   70.2 kg   Height:       Neurology awake and alert, not  able to follow commands, has bilateral mittens ENT with mild pallor Cardiovascular with S1 and S2 present with no gallops or murmurs No JVD No lower extremity edema Respiratory with no wheezing or rales, no rhonchi Abdomen soft but not tender   Data Reviewed:    Family Communication: I spoke with patient's wife  at the bedside, we talked in detail  about patient's condition, plan of care and prognosis and all questions were addressed.   Disposition: Status is: Observation The patient will require care spanning > 2 midnights and should be moved to inpatient because: IV fluids, neuro checks and pain control     Planned Discharge Destination: Skilled nursing facility   Author: Tawni Millers, MD 05/13/2021 3:53 PM  For on call review [email or web address].

## 2021-05-14 ENCOUNTER — Inpatient Hospital Stay (HOSPITAL_COMMUNITY): Payer: Medicare Other

## 2021-05-14 DIAGNOSIS — I714 Abdominal aortic aneurysm, without rupture, unspecified: Secondary | ICD-10-CM | POA: Diagnosis not present

## 2021-05-14 DIAGNOSIS — T84029A Dislocation of unspecified internal joint prosthesis, initial encounter: Secondary | ICD-10-CM | POA: Diagnosis not present

## 2021-05-14 DIAGNOSIS — I5032 Chronic diastolic (congestive) heart failure: Secondary | ICD-10-CM | POA: Diagnosis not present

## 2021-05-14 DIAGNOSIS — I2510 Atherosclerotic heart disease of native coronary artery without angina pectoris: Secondary | ICD-10-CM | POA: Diagnosis not present

## 2021-05-14 LAB — CBC
HCT: 30.7 % — ABNORMAL LOW (ref 39.0–52.0)
Hemoglobin: 9.4 g/dL — ABNORMAL LOW (ref 13.0–17.0)
MCH: 30.4 pg (ref 26.0–34.0)
MCHC: 30.6 g/dL (ref 30.0–36.0)
MCV: 99.4 fL (ref 80.0–100.0)
Platelets: 552 10*3/uL — ABNORMAL HIGH (ref 150–400)
RBC: 3.09 MIL/uL — ABNORMAL LOW (ref 4.22–5.81)
RDW: 13.9 % (ref 11.5–15.5)
WBC: 19 10*3/uL — ABNORMAL HIGH (ref 4.0–10.5)
nRBC: 0 % (ref 0.0–0.2)

## 2021-05-14 LAB — BASIC METABOLIC PANEL
Anion gap: 7 (ref 5–15)
BUN: 28 mg/dL — ABNORMAL HIGH (ref 8–23)
CO2: 27 mmol/L (ref 22–32)
Calcium: 8.7 mg/dL — ABNORMAL LOW (ref 8.9–10.3)
Chloride: 104 mmol/L (ref 98–111)
Creatinine, Ser: 0.71 mg/dL (ref 0.61–1.24)
GFR, Estimated: >60 mL/min (ref >60)
Glucose, Bld: 133 mg/dL — ABNORMAL HIGH (ref 70–99)
Potassium: 4.2 mmol/L (ref 3.5–5.1)
Sodium: 138 mmol/L (ref 135–145)

## 2021-05-14 MED ORDER — HYDROMORPHONE HCL 1 MG/ML IJ SOLN
1.0000 mg | INTRAMUSCULAR | Status: DC
  Administered 2021-05-14 (×2): 1 mg via INTRAVENOUS
  Filled 2021-05-14 (×2): qty 1

## 2021-05-14 MED ORDER — HALOPERIDOL LACTATE 2 MG/ML PO CONC
0.5000 mg | ORAL | Status: DC | PRN
  Filled 2021-05-14: qty 0.3

## 2021-05-14 MED ORDER — BIOTENE DRY MOUTH MT LIQD: 15.0000 mL | OROMUCOSAL | Status: DC | PRN

## 2021-05-14 MED ORDER — ACETAMINOPHEN 650 MG RE SUPP: 650.0000 mg | Freq: Four times a day (QID) | RECTAL | Status: DC | PRN

## 2021-05-14 MED ORDER — ACETAMINOPHEN 325 MG PO TABS: 650.0000 mg | ORAL_TABLET | Freq: Four times a day (QID) | ORAL | Status: DC | PRN

## 2021-05-14 MED ORDER — HYDROMORPHONE HCL PF 10 MG/ML IJ SOLN
INTRAMUSCULAR | Status: AC
  Filled 2021-05-14: qty 5

## 2021-05-14 MED ORDER — LORAZEPAM 1 MG PO TABS: 1.0000 mg | ORAL_TABLET | ORAL | Status: DC | PRN

## 2021-05-14 MED ORDER — HALOPERIDOL LACTATE 5 MG/ML IJ SOLN
0.5000 mg | INTRAMUSCULAR | Status: DC | PRN
  Administered 2021-05-14: 0.5 mg via INTRAVENOUS
  Filled 2021-05-14: qty 1

## 2021-05-14 MED ORDER — GLYCOPYRROLATE 0.2 MG/ML IJ SOLN: 0.2000 mg | INTRAMUSCULAR | Status: DC | PRN

## 2021-05-14 MED ORDER — HYDROCODONE-ACETAMINOPHEN 5-325 MG PO TABS: 1.0000 | ORAL_TABLET | ORAL | Status: DC | PRN

## 2021-05-14 MED ORDER — ONDANSETRON 4 MG PO TBDP: 4.0000 mg | ORAL_TABLET | Freq: Four times a day (QID) | ORAL | Status: DC | PRN

## 2021-05-14 MED ORDER — ONDANSETRON HCL 4 MG/2ML IJ SOLN
4.0000 mg | Freq: Four times a day (QID) | INTRAMUSCULAR | Status: DC | PRN
Start: 2021-05-14 — End: 2021-05-15

## 2021-05-14 MED ORDER — SODIUM CHLORIDE 0.9 % IV SOLN
2.0000 mg/h | INTRAVENOUS | Status: DC
  Administered 2021-05-14: 0.5 mg/h via INTRAVENOUS
  Filled 2021-05-14: qty 5

## 2021-05-14 MED ORDER — GLYCOPYRROLATE 0.2 MG/ML IJ SOLN
0.2000 mg | INTRAMUSCULAR | Status: DC | PRN
  Administered 2021-05-14: 0.2 mg via INTRAVENOUS
  Filled 2021-05-14: qty 1

## 2021-05-14 MED ORDER — HYDROMORPHONE HCL 1 MG/ML IJ SOLN
0.5000 mg | INTRAMUSCULAR | Status: DC | PRN
  Administered 2021-05-14: 0.5 mg via INTRAVENOUS
  Filled 2021-05-14: qty 0.5

## 2021-05-14 MED ORDER — POLYVINYL ALCOHOL 1.4 % OP SOLN: 1.0000 [drp] | Freq: Four times a day (QID) | OPHTHALMIC | Status: DC | PRN

## 2021-05-14 MED ORDER — GLYCOPYRROLATE 1 MG PO TABS: 1.0000 mg | ORAL_TABLET | ORAL | Status: DC | PRN

## 2021-05-14 MED ORDER — HYDROMORPHONE BOLUS VIA INFUSION
1.0000 mg | INTRAVENOUS | Status: DC | PRN
  Administered 2021-05-14: 1 mg via INTRAVENOUS
  Filled 2021-05-14: qty 1

## 2021-05-14 MED ORDER — NALOXONE HCL 0.4 MG/ML IJ SOLN: 0.4000 mg | Freq: Once | INTRAMUSCULAR | Status: DC

## 2021-05-14 MED ORDER — HYDROMORPHONE HCL 1 MG/ML IJ SOLN
1.0000 mg | INTRAMUSCULAR | Status: DC | PRN
  Administered 2021-05-14 (×2): 1 mg via INTRAVENOUS
  Filled 2021-05-14 (×2): qty 1

## 2021-05-14 MED ORDER — LORAZEPAM 2 MG/ML PO CONC: 1.0000 mg | ORAL | Status: DC | PRN

## 2021-05-14 MED ORDER — LORAZEPAM 2 MG/ML IJ SOLN
1.0000 mg | INTRAMUSCULAR | Status: DC | PRN
  Administered 2021-05-14: 1 mg via INTRAVENOUS
  Filled 2021-05-14: qty 1

## 2021-05-14 MED ORDER — HALOPERIDOL 0.5 MG PO TABS: 0.5000 mg | ORAL_TABLET | ORAL | Status: DC | PRN

## 2021-05-15 ENCOUNTER — Encounter: Payer: Self-pay | Admitting: Nurse Practitioner

## 2021-05-15 NOTE — Progress Notes (Signed)
Patients family was able to have grieving and visiting time with the patients body. Per family no other visits will be coming. Post -mortem preparation completed, patient was transferred to the hospital morgue. Family agreeable with this plan

## 2021-05-16 ENCOUNTER — Encounter (HOSPITAL_COMMUNITY): Payer: Self-pay | Admitting: Orthopedic Surgery

## 2021-05-25 NOTE — Progress Notes (Signed)
°   May 23, 2021 0900  Assess: MEWS Score  Temp 99.5 F (37.5 C)  BP 110/64  Pulse Rate (!) 110  Resp (!) 21  Level of Consciousness Alert  SpO2 97 %  O2 Device Nasal Cannula  O2 Flow Rate (L/min) 3.5 L/min  Assess: MEWS Score  MEWS Temp 0  MEWS Systolic 0  MEWS Pulse 1  MEWS RR 1  MEWS LOC 0  MEWS Score 2  MEWS Score Color Yellow  Assess: if the MEWS score is Yellow or Red  Were vital signs taken at a resting state? Yes  Focused Assessment Change from prior assessment (see assessment flowsheet)  Early Detection of Sepsis Score *See Row Information* Low  Treat  Pain Scale Faces  Faces Pain Scale 6  Pain Type Acute pain  Pain Location Hip  Pain Orientation Right  Pain Descriptors / Indicators Discomfort;Tender  Pain Frequency Constant  Pain Intervention(s) Medication (See eMAR)  Multiple Pain Sites Yes  Breathing 1  Negative Vocalization 1  Facial Expression 2  Body Language 1  Consolability 1  PAINAD Score 6  Neuro symptoms relieved by Rest  Patients response to intervention Effective  Escalate  MEWS: Escalate Yellow: discuss with charge nurse/RN and consider discussing with provider and RRT  Notify: Charge Nurse/RN  Name of Charge Nurse/RN Notified Alwyn Ren, RN  Date Charge Nurse/RN Notified 2021-05-23  Time Charge Nurse/RN Notified 0908  Notify: Provider  Provider Name/Title Tawni Millers, MD  Date Provider Notified 05/23/2021  Time Provider Notified [phone]  Notification Type Page  Notification Reason Change in status  Provider response En route  Date of Provider Response May 23, 2021  Time of Provider Response [phone]

## 2021-05-25 NOTE — Progress Notes (Signed)
Patient sounding congested with excessive secretions noted to airway. Suctioned x3. RN discussing with family the s/s as the body continues decline. They are made aware that agonal breathing with periods of apnea may occur as decline continues. Family instructed that they can call RN as needed for support and comfort. I will continue to monitor patient as the shift progresses.

## 2021-05-25 NOTE — Progress Notes (Signed)
Patient started on 1 mg Dilaudid drip at 1640. Wife came to get me that patient was moaning again at 1800 and uncomfortable. Bolus given x1 d/t Resp rate of 26 and moaning. Drip increased to 2mg  along with haldol given. Notified MD 45 minutes later that patient was still uncomfortable and moaning. N.O for Ativan to be given and continue to monitor to see if that helps.. Will notify nightshift nurse.

## 2021-05-25 NOTE — Death Summary Note (Addendum)
DEATH SUMMARY   Patient Details  Name: [name] MRN: [id number] DOB: 07-21-1930 [id number], [name] Ha, MD  Admission/Discharge Information   Admit Date:  21-May-2021  Date of Death: Date of Death: May 24, 2021  Time of Death: Time of Death: 06-20-23  Length of Stay: 2   Principle Cause of death: dementia with delirium    Hospital Diagnoses: Principal Problem:   Dislocation of hip joint prosthesis ([place]) Active Problems:   Vascular dementia without behavioral disturbance ([place])   Depression with anxiety   CAD (coronary artery disease), native coronary artery   Paroxysmal atrial fibrillation ([place])   Pulmonary fibrosis assoc with RA    Essential hypertension   Abdominal aortic aneurysm without rupture   Chronic diastolic (congestive) heart failure [facility]) Metabolic encephalopathy   Hospital Course: [name] was admitted to the hospital with the working diagnosis of dislocation of total right hip.  Worsening encephalopathy and delirium, transitioned to comfort care.   88 yo male with the past medical history of atrial fibrillation, abdominal aortic aneurysm, COPD, rheumatoid arthritis, and pulmonary fibrosis with chronic hypoxemic respiratory failure, who presented with right hip pain. Patient had acute onset of severe pain at the right hip that prompted his transfer to the hospital, no history of recent fall. On his initial physical examination his blood pressure was 136/63, HR 94, RR 19, and oxygen saturation 98%, his lungs were clear to auscultation, heart with S1 and S2 present with no gallops, abdomen soft and trace bilaterally lower extremity edema, positive pain at the right hip joint.   Na 138, K 4,5 CL 102. Bicarb 26, glucose 87, bun 17 and cr 0,69 Wbc 12,6 hgb 10,6 hct 33,5 plt 662  Sars covid 19 negative  Urine analysis with SG 1,025 negative leukocytes.   Pelvic radiograph with persistent dislocation of the tight total hip arthroplasty.   Patient was placed on  analgesics and orthopedics was consulted.  02/16 patient underwent closed reduction of right hip.   2022-05-24 worsening encephalopathy and delirium with persistent pain.  No oral intake and poor responsiveness Patient with poor prognosis, will transition to comfort care.   Assessment and Plan: * Dislocation of hip joint prosthesis ([place]) Sp closed reduction of right hip.  Post procedure patient was placed on analgesics and a brace to hold hip.  Unfortunately patient continue to have pain, developed encephalopathy and delirium.  Patient with persistent and worsening hip pain, no oral intake and worsening encephalopathy.  Poor mobility and not able to ambulate, very weak and deconditioned, poor prognosis.  His wife decided to transition to comfort care to allow better pain control and not to prolong suffering.   Dementia with behavioral disturbance- (present on admission) Worsening delirium and encephalopathy.  Patient with persistent pain, no oral intake and non ambulatory. His wife is at the bedside.   Patient with poor prognosis, continue to be in pain.  Will transition to comfort care, concentrate in symptom control and prevent any further suffering.   Depression with anxiety- (present on admission) Patient will be transitioned to comfort care.   CAD (coronary artery disease), native coronary artery- (present on admission) No chest pain, continue comfort care.   Paroxysmal atrial fibrillation ([place])- (present on admission) HR 93 to 110, patient has been in pain. Continue pain control and symptomatic management.    Pulmonary fibrosis assoc with RA - (present on admission) Continue with supplemental 02 per [place] to keep 02 saturation of 88% or greater.  Oxygenation is 97% on 3,5 L/min  per [place]   Essential hypertension- (present on admission) His blood pressure has been stable with systolic 725 this am. Continue pain control.   Abdominal aortic aneurysm without rupture- (present on  admission) Stable  Chronic diastolic (congestive) heart failure ([place])- (present on admission) No clinical signs of decompensation.          Procedures: right hip closed reduction   Consultations: orthopedics   The results of significant diagnostics from this hospitalization (including imaging, microbiology, ancillary and laboratory) are listed below for reference.   Significant Diagnostic Studies: CT Angio Chest PE W and/or Wo Contrast  Result Date: 05/01/2021 CLINICAL DATA:  Fever, shortness of breath and history pulmonary fibrosis with oxygen dependence. EXAM: CT ANGIOGRAPHY CHEST WITH CONTRAST TECHNIQUE: Multidetector CT imaging of the chest was performed using the standard protocol during bolus administration of intravenous contrast. Multiplanar CT image reconstructions and MIPs were obtained to evaluate the vascular anatomy. RADIATION DOSE REDUCTION: This exam was performed according to the departmental dose-optimization program which includes automated exposure control, adjustment of the mA and/or kV according to patient size and/or use of iterative reconstruction technique. CONTRAST:  70mL OMNIPAQUE IOHEXOL 350 MG/ML SOLN COMPARISON:  CT of the chest without contrast on 04/10/2014 FINDINGS: Cardiovascular: The pulmonary arteries are adequately opacified. There is no evidence of acute pulmonary embolism. Mild central pulmonary artery dilatation with the main pulmonary artery measuring up to 3 cm is consistent with probable mild underlying pulmonary hypertension. The heart is mildly enlarged. Atherosclerosis of the thoracic aorta and calcification at the level of the aortic valve. No evidence of thoracic aortic aneurysm. Calcified coronary artery plaque present. No pericardial fluid. Mediastinum/Nodes: The esophagus is diffusely thickened and contains some fluid and debris with evidence of probable fundoplication and/or hiatal hernia repair in the past. Underlying esophageal pathology is  not excluded including neoplasm or stricture. Compared to the prior CT, there is increased lymph node prominence in the mediastinum and right hilar region. Subcarinal lymph node tissue is ill-defined but measures roughly 2 cm in short axis. Next largest mediastinal lymph node is an AP window node measuring roughly 1.3 cm in short axis. Mildly prominent right hilar lymph node tissue. All of these lymph nodes may relate to reactive lymphadenopathy secondary to progressive lung disease. Lungs/Pleura: Significant progressive pulmonary fibrosis in both lungs as well as severe underlying emphysematous disease. In addition to fibrotic lung disease and cystic fibrotic lung disease there are areas of airspace disease per particularly in the right lower lobe and left lung base. Underlying pneumonia would be difficult to exclude. Correlation also suggested with the possibility of chronic aspiration given the esophageal abnormalities described above. No evidence of pleural fluid or pneumothorax. Upper Abdomen: No acute abnormality. Musculoskeletal: No chest wall abnormality. No acute or significant osseous findings. Review of the MIP images confirms the above findings. IMPRESSION: 1. No evidence of acute pulmonary embolism. 2. Abnormal esophagus by CT with diffuse thickening and fluid and debris within the esophagus. There is evidence of prior fundoplication/hiatal hernia repair. Underlying esophageal pathology is not excluded including neoplasm or stricture. 3. Progressive severe pulmonary fibrosis in both lungs as well as areas of airspace disease particularly in the lower lobes, right greater than left. Underlying pneumonia would be difficult to exclude. Correlation suggested with the possibility of chronic aspiration given esophageal abnormalities. 4. Mildly prominent mediastinal and right hilar lymph nodes may be reactive lymphadenopathy secondary to progressive lung disease. 5. Mildly dilated central pulmonary arteries  are suggestive of some degree of underlying  pulmonary hypertension. 6. Aortic and coronary atherosclerosis. Aortic Atherosclerosis (ICD10-I70.0) and Emphysema (ICD10-J43.9). Electronically Signed   By: Aletta Edouard M.D.   On: 05/01/2021 12:41   DG Pelvis Portable  Result Date: 05/20/2021 CLINICAL DATA:  Right hip dislocation.  Confirm reduction. EXAM: PORTABLE PELVIS 1-2 VIEWS COMPARISON:  Radiographs earlier the same date, 05/02/2021 and 04/25/2021. FINDINGS: 1219 hours. Persistent superior and posterior dislocation of the right total hip arthroplasty. Appearance is unchanged from the examination earlier today. No acute right hip fractures are identified, although the distal end of the prosthesis is not included. Known nondisplaced periprosthetic fracture of the proximal left femur appears grossly stable, incompletely visualized. Abdominal aortoiliac stent graft again noted. IMPRESSION: Persistent dislocation of the right total hip arthroplasty. Electronically Signed   By: Richardean Sale M.D.   On: 04/29/2021 12:31   DG Chest Port 1 View  Result Date: 05/01/2021 CLINICAL DATA:  Possible sepsis. EXAM: PORTABLE CHEST 1 VIEW COMPARISON:  04/26/2021 FINDINGS: 1106 hours. Similar appearance of the diffuse peripherally predominant interstitial and patchy airspace opacity. No evidence of pleural effusion. The cardio pericardial silhouette is enlarged. The visualized bony structures of the thorax show no acute abnormality. Telemetry leads overlie the chest. IMPRESSION: Similar appearance of the diffuse peripherally predominant interstitial and patchy airspace opacity suggesting underlying chronic lung disease. Electronically Signed   By: Misty Stanley M.D.   On: 05/01/2021 11:20   DG C-Arm 1-60 Min-No Report  Result Date: 05/10/2021 Fluoroscopy was utilized by the requesting physician.  No radiographic interpretation.   DG HIP UNILAT WITH PELVIS 2-3 VIEWS RIGHT  Result Date: 04/30/2021 CLINICAL DATA:   Reduction of right hip arthroplasty dislocation EXAM: DG HIP (WITH OR WITHOUT PELVIS) 2-3V RIGHT COMPARISON:  05/18/2021 FINDINGS: One fluoroscopic image was obtained during the performance of the procedure and is provided for interpretation only. The image demonstrates anatomic alignment of the right hip arthroplasty. Please refer to operative report. Fluoroscopy time: 1 minute 19 seconds, 19.0 mGy IMPRESSION: 1. Reduction of right hip arthroplasty dislocation as above. Electronically Signed   By: Randa Ngo M.D.   On: 05/03/2021 15:10   DG Hip Unilat W or Wo Pelvis 2-3 Views Right  Result Date: 05/10/2021 CLINICAL DATA:  Right hip pain. EXAM: DG HIP (WITH OR WITHOUT PELVIS) 2-3V RIGHT COMPARISON:  Left hip x-rays dated May 02, 2021. FINDINGS: Bilateral hip arthroplasties. New superior and posterior dislocation of the right femoral head component. Unchanged acute nondisplaced periprosthetic fractures of the left greater trochanter and subtrochanteric femur. Old healed fracture of the right femoral diaphysis just inferior to the femoral stem. Abdominal aortoiliac stent graft again noted. Osteopenia. Soft tissues are unremarkable. IMPRESSION: 1. New right hip arthroplasty dislocation. 2. Unchanged acute nondisplaced periprosthetic fractures of the left greater trochanter and subtrochanteric femur. Electronically Signed   By: Titus Dubin M.D.   On: 04/27/2021 10:57   DG HIPS BILAT WITH PELVIS MIN 5 VIEWS  Result Date: 05/02/2021 CLINICAL DATA:  Fracture left femur EXAM: DG HIP (WITH OR WITHOUT PELVIS) 5+V BILAT COMPARISON:  Previous studies including the examination of 04/27/2021 FINDINGS: There is previous arthroplasty in both hips. Fracture is seen in the lateral cortical margin of proximal shaft at the level of lesser trochanter. No recent fracture is seen in the right hip. Osteopenia is seen in bony structures. There is evidence of endovascular stent repair of abdominal aorta and iliac arteries.  IMPRESSION: Previous bilateral hip arthroplasty. There is recent essentially undisplaced fracture in the lateral aspect of proximal  left femur. No fracture or dislocation is seen in the right hip. Electronically Signed   By: Elmer Picker M.D.   On: 05/02/2021 15:33    Microbiology: Recent Results (from the past 240 hour(s))  Resp Panel by RT-PCR (Flu A&B, Covid) Nasopharyngeal Swab     Status: None   Collection Time: 04/30/2021 12:23 PM   Specimen: Nasopharyngeal Swab; Nasopharyngeal(NP) swabs in vial transport medium  Result Value Ref Range Status   SARS Coronavirus 2 by RT PCR NEGATIVE NEGATIVE Final    Comment: (NOTE) SARS-CoV-2 target nucleic acids are NOT DETECTED.  The SARS-CoV-2 RNA is generally detectable in upper respiratory specimens during the acute phase of infection. The lowest concentration of SARS-CoV-2 viral copies this assay can detect is 138 copies/mL. A negative result does not preclude SARS-Cov-2 infection and should not be used as the sole basis for treatment or other patient management decisions. A negative result may occur with  improper specimen collection/handling, submission of specimen other than nasopharyngeal swab, presence of viral mutation(s) within the areas targeted by this assay, and inadequate number of viral copies(<138 copies/mL). A negative result must be combined with clinical observations, patient history, and epidemiological information. The expected result is Negative.  Fact Sheet for Patients:  EntrepreneurPulse.com.au  Fact Sheet for Healthcare Providers:  IncredibleEmployment.be  This test is no t yet approved or cleared by the [place] FDA and  has been authorized for detection and/or diagnosis of SARS-CoV-2 by FDA under an Emergency Use Authorization (EUA). This EUA will remain  in effect (meaning this test can be used) for the duration of the COVID-19 declaration under Section 564(b)(1) of  the Act, 21 U.S.C.section 360bbb-3(b)(1), unless the authorization is terminated  or revoked sooner.       Influenza A by PCR NEGATIVE NEGATIVE Final   Influenza B by PCR NEGATIVE NEGATIVE Final    Comment: (NOTE) The Xpert Xpress SARS-CoV-2/FLU/RSV plus assay is intended as an aid in the diagnosis of influenza from Nasopharyngeal swab specimens and should not be used as a sole basis for treatment. Nasal washings and aspirates are unacceptable for Xpert Xpress SARS-CoV-2/FLU/RSV testing.  Fact Sheet for Patients: EntrepreneurPulse.com.au  Fact Sheet for Healthcare Providers: IncredibleEmployment.be  This test is not yet approved or cleared by the [place] FDA and has been authorized for detection and/or diagnosis of SARS-CoV-2 by FDA under an Emergency Use Authorization (EUA). This EUA will remain in effect (meaning this test can be used) for the duration of the COVID-19 declaration under Section 564(b)(1) of the Act, 21 U.S.C. section 360bbb-3(b)(1), unless the authorization is terminated or revoked.  Performed at [facility], [address], [place], [place] [address]   Surgical pcr screen     Status: None   Collection Time: 05/17/2021 10:38 PM   Specimen: Nasal Mucosa; Nasal Swab  Result Value Ref Range Status   MRSA, PCR NEGATIVE NEGATIVE Final   Staphylococcus aureus NEGATIVE NEGATIVE Final    Comment: (NOTE) The Xpert SA Assay (FDA approved for NASAL specimens in patients 8 years of age and older), is one component of a comprehensive surveillance program. It is not intended to diagnose infection nor to guide or monitor treatment. Performed at [facility], [address] [place], [place] [address]     Signed: Tawni Millers, MD

## 2021-05-25 NOTE — Progress Notes (Signed)
Got with MD to ask Dr. Aline Brochure if we can removed abduction brace. And Dr. Aline Brochure didn't want to do that for now and wanted to do a xray for possible dislocation again. Wife refused to have xray she stated "I just want to make him comfortable." Notified Dr. Aline Brochure and MD.

## 2021-05-25 NOTE — Progress Notes (Addendum)
Progress Note   Patient: [name]:[id number] DOB: March 18, 1931 DOA: 05/02/2021     1 DOS: the patient was seen and examined on Jun 06, 2021   Brief hospital course: [name] was admitted to the hospital with the working diagnosis of dislocation of total right hip.  Worsening encephalopathy and delirium, transitioned to comfort care.   88 yo male with the past medical history of atrial fibrillation, abdominal aortic aneurysm, COPD, rheumatoid arthritis, and pulmonary fibrosis with chronic hypoxemic respiratory failure, who presented with right hip pain. Patient had acute onset of severe pain at the right hip that prompted his transfer to the hospital, no history of recent fall. On his initial physical examination his blood pressure was 136/63, HR 94, RR 19, and oxygen saturation 98%, his lungs were clear to auscultation, heart with S1 and S2 present with no gallops, abdomen soft and trace bilaterally lower extremity edema, positive pain at the right hip joint.   Na 138, K 4,5 CL 102. Bicarb 26, glucose 87, bun 17 and cr 0,69 Wbc 12,6 hgb 10,6 hct 33,5 plt 662  Sars covid 19 negative  Urine analysis with SG 1,025 negative leukocytes.   Pelvic radiograph with persistent dislocation of the tight total hip arthroplasty.   Patient was placed on analgesics and orthopedics was consulted.  02/16 patient underwent closed reduction of right hip.   02/18 worsening encephalopathy and delirium with persistent pain.  No oral intake and poor responsiveness Patient with poor prognosis, will transition to comfort care.   Assessment and Plan: * Dislocation of hip joint prosthesis ([place]) Sp closed reduction of right hip.   Patient with persistent and worsening hip pain, no oral intake and worsening encephalopathy.  Poor mobility and not able to ambulate, very weak and deconditioned, poor prognosis.    Vascular dementia without behavioral disturbance ([place])- (present on admission) Worsening  delirium and encephalopathy.  Patient with persistent pain, no oral intake and non ambulatory. His wife is at the bedside.   Patient with poor prognosis, continue to be in pain.  Will transition to comfort care, concentrate in symptom control and prevent any further suffering.   Depression with anxiety- (present on admission) Patient will be transitioned to comfort care.   CAD (coronary artery disease), native coronary artery- (present on admission) No chest pain, continue comfort care.   Paroxysmal atrial fibrillation ([place])- (present on admission) HR 93 to 110, patient has been in pain. Continue pain control and symptomatic management.    Pulmonary fibrosis assoc with RA - (present on admission) Continue with supplemental 02 per [place] to keep 02 saturation of 88% or greater.  Oxygenation is 97% on 3,5 L/min per [place]   Essential hypertension- (present on admission) His blood pressure has been stable with systolic 035 this am. Continue pain control.   Abdominal aortic aneurysm without rupture- (present on admission) Stable  Chronic diastolic (congestive) heart failure ([place])- (present on admission) No clinical signs of decompensation.         Subjective: Patient with worsening encephalopathy, no oral intake, persistent pain and confusion.   Physical Exam: Vitals:   05/13/21 0619 05/13/21 2222 June 06, 2021 0543 2021-06-06 0900  BP: 106/64 102/65 104/68 110/64  Pulse: (!) 108 86 93 (!) 110  Resp: 17 19 20  (!) 21  Temp: 98.4 F (36.9 C) 97.9 F (36.6 C) 98.6 F (37 C) 99.5 F (37.5 C)  TempSrc:    Axillary  SpO2: 94% 93% 96% 97%  Weight:   73.6 kg   Height:  Neurology opens eyes to voice and responds to simple questions, he is confused and disorientate, very weak and deconditioned  ENT pallor Cardiovascular with S1 and S2 present tachycardic, with  no gallops No JVD No lower extremity edema Respiratory with bilateral rhonchi and rales, no wheezing Abdomen soft He  has brace attached to his abdomen and right lower extremity.   Data Reviewed:    Family Communication: I spoke with patient's wife at the bedside, we talked in detail about patient's condition, plan of care and prognosis and all questions were addressed.  I talked about [name] worsening condition, persistent pain and worsening delirium, poor prognosis, not abel to eat or ambulate, with significant comorbid medical conditions, including progressive dementia, atrial fibrillation, heart failure and pulmonary fibrosis.  Decision was made to transition patient to comfort care.   Disposition: Status is: Inpatient Remains inpatient appropriate because: comfort care, possible imminent death      Planned Discharge Destination:  to be determined possible hospice      Author: Tawni Millers, MD 08-Jun-2021 10:37 AM  For on call review [email or web address].

## 2021-05-25 NOTE — Progress Notes (Signed)
Patient continue with significant pain despite scheduled and as needed hydromorphone. Plan to continue analgesia with hydromorphone drip for better pain control.  Patient with very poor prognosis, his wife has requested to remove orthopedic brace and declined further hip radiographs ordered per Dr. Aline Brochure.

## 2021-05-25 NOTE — Progress Notes (Signed)
Subjective: 2 Days Post-Op Procedure(s) (LRB): CLOSED REDUCTION HIP (Right)  Made aware of mental status changes and persistent painful hip  I m concerned the MS change may be dur to age and opioids   I ve ordered narcan and a pelvic xray to see if he responds beter and to see if the unstable hip has re dislocated   Objective: Vital signs in last 24 hours: Temp:  [97.9 F (36.6 C)-99.5 F (37.5 C)] 99.5 F (37.5 C) (02/18 0900) Pulse Rate:  [86-110] 110 (02/18 0900) Resp:  [19-21] 21 (02/18 0900) BP: (102-110)/(64-68) 110/64 (02/18 0900) SpO2:  [93 %-97 %] 97 % (02/18 0900) Weight:  [73.6 kg] 73.6 kg (02/18 0543)  Intake/Output from previous day: 02/17 0701 - 02/18 0700 In: 910.9 [P.O.:560; I.V.:350.9] Out: 900 [Urine:900] Intake/Output this shift: No intake/output data recorded.  Recent Labs    04/29/2021 1235 05/03/2021 0448 05-28-21 0712  HGB 10.6* 9.8* 9.4*   Recent Labs    05/10/2021 0448 2021-05-28 0712  WBC 13.6* 19.0*  RBC 3.21* 3.09*  HCT 33.0* 30.7*  PLT 574* 552*   Recent Labs    05/03/2021 0448 05-28-2021 0712  NA 137 138  K 4.2 4.2  CL 102 104  CO2 21* 27  BUN 17 28*  CREATININE 0.61 0.71  GLUCOSE 67* 133*  CALCIUM 9.0 8.7[facility] 05/28/2021, 11:31 AM

## 2021-05-25 NOTE — Progress Notes (Signed)
Called to the room by patients wife to re-exam the patient. On assessment no heart beat or pulse was noted. Charge nurse called and informed. She also examined patient and found no heartbeat or pulse. Patient pronounced  dead@11 :25 pm. MD notified

## 2021-05-25 DEATH — deceased

## 2021-06-13 ENCOUNTER — Ambulatory Visit: Payer: Medicare Other | Admitting: Interventional Cardiology

## 2021-07-20 ENCOUNTER — Ambulatory Visit: Payer: Medicare Other | Admitting: Podiatry

## 2021-08-17 ENCOUNTER — Encounter (INDEPENDENT_AMBULATORY_CARE_PROVIDER_SITE_OTHER): Payer: Medicare Other | Admitting: Ophthalmology
